# Patient Record
Sex: Female | Born: 1965 | Race: Black or African American | Hispanic: No | Smoking: Never smoker
Health system: Southern US, Community
[De-identification: ages and names within clinical notes are randomized; demographics above are authoritative.]

## PROBLEM LIST (undated history)

## (undated) DIAGNOSIS — C9000 Multiple myeloma not having achieved remission: Secondary | ICD-10-CM

## (undated) DIAGNOSIS — M459 Ankylosing spondylitis of unspecified sites in spine: Secondary | ICD-10-CM

## (undated) DIAGNOSIS — M1990 Unspecified osteoarthritis, unspecified site: Secondary | ICD-10-CM

## (undated) DIAGNOSIS — K559 Vascular disorder of intestine, unspecified: Secondary | ICD-10-CM

## (undated) DIAGNOSIS — C50919 Malignant neoplasm of unspecified site of unspecified female breast: Secondary | ICD-10-CM

## (undated) DIAGNOSIS — K449 Diaphragmatic hernia without obstruction or gangrene: Secondary | ICD-10-CM

## (undated) DIAGNOSIS — K219 Gastro-esophageal reflux disease without esophagitis: Secondary | ICD-10-CM

## (undated) DIAGNOSIS — E0590 Thyrotoxicosis, unspecified without thyrotoxic crisis or storm: Secondary | ICD-10-CM

## (undated) DIAGNOSIS — I10 Essential (primary) hypertension: Secondary | ICD-10-CM

## (undated) DIAGNOSIS — F419 Anxiety disorder, unspecified: Secondary | ICD-10-CM

## (undated) DIAGNOSIS — R0683 Snoring: Principal | ICD-10-CM

## (undated) DIAGNOSIS — M7071 Other bursitis of hip, right hip: Secondary | ICD-10-CM

## (undated) DIAGNOSIS — I499 Cardiac arrhythmia, unspecified: Secondary | ICD-10-CM

## (undated) HISTORY — DX: Gastro-esophageal reflux disease without esophagitis: K21.9

## (undated) HISTORY — DX: Other bursitis of hip, right hip: M70.71

## (undated) HISTORY — PX: MASTECTOMY: SHX3

## (undated) HISTORY — DX: Snoring: R06.83

## (undated) HISTORY — DX: Anxiety disorder, unspecified: F41.9

## (undated) HISTORY — DX: Cardiac arrhythmia, unspecified: I49.9

## (undated) HISTORY — PX: ABDOMINAL HYSTERECTOMY: SHX81

## (undated) HISTORY — DX: Diaphragmatic hernia without obstruction or gangrene: K44.9

## (undated) HISTORY — PX: BREAST LUMPECTOMY: SHX2

## (undated) HISTORY — DX: Vascular disorder of intestine, unspecified: K55.9

## (undated) HISTORY — DX: Thyrotoxicosis, unspecified without thyrotoxic crisis or storm: E05.90

## (undated) HISTORY — DX: Ankylosing spondylitis of unspecified sites in spine: M45.9

## (undated) HISTORY — DX: Multiple myeloma not having achieved remission: C90.00

## (undated) HISTORY — DX: Unspecified osteoarthritis, unspecified site: M19.90

---

## 1997-07-17 ENCOUNTER — Encounter (HOSPITAL_COMMUNITY): Admission: RE | Admit: 1997-07-17 | Discharge: 1997-10-15 | Payer: Self-pay | Admitting: Obstetrics and Gynecology

## 1997-10-30 ENCOUNTER — Inpatient Hospital Stay (HOSPITAL_COMMUNITY): Admission: AD | Admit: 1997-10-30 | Discharge: 1997-10-30 | Payer: Self-pay | Admitting: Obstetrics and Gynecology

## 1997-12-04 ENCOUNTER — Inpatient Hospital Stay (HOSPITAL_COMMUNITY): Admission: AD | Admit: 1997-12-04 | Discharge: 1997-12-04 | Payer: Self-pay | Admitting: Obstetrics and Gynecology

## 1997-12-25 ENCOUNTER — Encounter (HOSPITAL_COMMUNITY): Admission: RE | Admit: 1997-12-25 | Discharge: 1998-03-25 | Payer: Self-pay | Admitting: Obstetrics & Gynecology

## 1998-01-08 ENCOUNTER — Inpatient Hospital Stay (HOSPITAL_COMMUNITY): Admission: AD | Admit: 1998-01-08 | Discharge: 1998-01-08 | Payer: Self-pay | Admitting: Obstetrics & Gynecology

## 1998-09-18 ENCOUNTER — Other Ambulatory Visit: Admission: RE | Admit: 1998-09-18 | Discharge: 1998-09-18 | Payer: Self-pay | Admitting: Obstetrics and Gynecology

## 2000-01-23 ENCOUNTER — Encounter: Admission: RE | Admit: 2000-01-23 | Discharge: 2000-01-23 | Payer: Self-pay | Admitting: General Surgery

## 2000-01-23 ENCOUNTER — Encounter (INDEPENDENT_AMBULATORY_CARE_PROVIDER_SITE_OTHER): Payer: Self-pay | Admitting: Specialist

## 2000-01-23 ENCOUNTER — Other Ambulatory Visit: Admission: RE | Admit: 2000-01-23 | Discharge: 2000-01-23 | Payer: Self-pay | Admitting: General Surgery

## 2000-01-23 ENCOUNTER — Encounter: Payer: Self-pay | Admitting: General Surgery

## 2001-10-13 ENCOUNTER — Other Ambulatory Visit: Admission: RE | Admit: 2001-10-13 | Discharge: 2001-10-13 | Payer: Self-pay | Admitting: Obstetrics and Gynecology

## 2002-10-31 ENCOUNTER — Other Ambulatory Visit: Admission: RE | Admit: 2002-10-31 | Discharge: 2002-10-31 | Payer: Self-pay | Admitting: Obstetrics and Gynecology

## 2003-11-22 ENCOUNTER — Other Ambulatory Visit: Admission: RE | Admit: 2003-11-22 | Discharge: 2003-11-22 | Payer: Self-pay | Admitting: Obstetrics and Gynecology

## 2004-12-11 ENCOUNTER — Other Ambulatory Visit: Admission: RE | Admit: 2004-12-11 | Discharge: 2004-12-11 | Payer: Self-pay | Admitting: Obstetrics and Gynecology

## 2005-01-20 ENCOUNTER — Encounter: Admission: RE | Admit: 2005-01-20 | Discharge: 2005-01-20 | Payer: Self-pay | Admitting: Family Medicine

## 2005-02-09 ENCOUNTER — Encounter (INDEPENDENT_AMBULATORY_CARE_PROVIDER_SITE_OTHER): Payer: Self-pay | Admitting: *Deleted

## 2005-02-09 ENCOUNTER — Other Ambulatory Visit: Admission: RE | Admit: 2005-02-09 | Discharge: 2005-02-09 | Payer: Self-pay | Admitting: Interventional Radiology

## 2005-02-09 ENCOUNTER — Encounter: Admission: RE | Admit: 2005-02-09 | Discharge: 2005-02-09 | Payer: Self-pay | Admitting: Family Medicine

## 2005-07-03 ENCOUNTER — Encounter: Admission: RE | Admit: 2005-07-03 | Discharge: 2005-07-03 | Payer: Self-pay | Admitting: Obstetrics and Gynecology

## 2005-07-03 ENCOUNTER — Encounter (INDEPENDENT_AMBULATORY_CARE_PROVIDER_SITE_OTHER): Payer: Self-pay | Admitting: *Deleted

## 2005-07-03 ENCOUNTER — Encounter (INDEPENDENT_AMBULATORY_CARE_PROVIDER_SITE_OTHER): Payer: Self-pay | Admitting: Diagnostic Radiology

## 2005-07-13 ENCOUNTER — Ambulatory Visit (HOSPITAL_COMMUNITY): Admission: RE | Admit: 2005-07-13 | Discharge: 2005-07-13 | Payer: Self-pay | Admitting: Obstetrics and Gynecology

## 2005-08-11 ENCOUNTER — Encounter: Admission: RE | Admit: 2005-08-11 | Discharge: 2005-08-11 | Payer: Self-pay | Admitting: General Surgery

## 2005-08-13 ENCOUNTER — Ambulatory Visit (HOSPITAL_BASED_OUTPATIENT_CLINIC_OR_DEPARTMENT_OTHER): Admission: RE | Admit: 2005-08-13 | Discharge: 2005-08-13 | Payer: Self-pay | Admitting: General Surgery

## 2005-08-13 ENCOUNTER — Encounter (INDEPENDENT_AMBULATORY_CARE_PROVIDER_SITE_OTHER): Payer: Self-pay | Admitting: *Deleted

## 2005-08-13 ENCOUNTER — Encounter: Admission: RE | Admit: 2005-08-13 | Discharge: 2005-08-13 | Payer: Self-pay | Admitting: General Surgery

## 2005-08-14 ENCOUNTER — Ambulatory Visit: Payer: Self-pay | Admitting: Oncology

## 2005-08-26 LAB — CBC WITH DIFFERENTIAL/PLATELET
BASO%: 0.4 % (ref 0.0–2.0)
Basophils Absolute: 0 10*3/uL (ref 0.0–0.1)
HCT: 35 % (ref 34.8–46.6)
LYMPH%: 21.2 % (ref 14.0–48.0)
MCHC: 33.5 g/dL (ref 32.0–36.0)
MCV: 83 fL (ref 81.0–101.0)
MONO#: 0.7 10*3/uL (ref 0.1–0.9)
MONO%: 8.3 % (ref 0.0–13.0)
NEUT%: 69.3 % (ref 39.6–76.8)
Platelets: 407 10*3/uL — ABNORMAL HIGH (ref 145–400)
RBC: 4.22 10*6/uL (ref 3.70–5.32)
lymph#: 1.9 10*3/uL (ref 0.9–3.3)

## 2005-08-26 LAB — COMPREHENSIVE METABOLIC PANEL
ALT: 17 U/L (ref 0–40)
AST: 18 U/L (ref 0–37)
Chloride: 103 mEq/L (ref 96–112)
Creatinine, Ser: 0.66 mg/dL (ref 0.40–1.20)
Total Bilirubin: 0.4 mg/dL (ref 0.3–1.2)

## 2005-09-01 ENCOUNTER — Encounter (INDEPENDENT_AMBULATORY_CARE_PROVIDER_SITE_OTHER): Payer: Self-pay | Admitting: Specialist

## 2005-09-01 ENCOUNTER — Ambulatory Visit (HOSPITAL_BASED_OUTPATIENT_CLINIC_OR_DEPARTMENT_OTHER): Admission: RE | Admit: 2005-09-01 | Discharge: 2005-09-01 | Payer: Self-pay | Admitting: General Surgery

## 2005-09-09 ENCOUNTER — Ambulatory Visit (HOSPITAL_COMMUNITY): Admission: RE | Admit: 2005-09-09 | Discharge: 2005-09-09 | Payer: Self-pay | Admitting: Oncology

## 2005-09-14 ENCOUNTER — Ambulatory Visit (HOSPITAL_COMMUNITY): Admission: RE | Admit: 2005-09-14 | Discharge: 2005-09-14 | Payer: Self-pay | Admitting: Oncology

## 2005-09-21 LAB — CBC WITH DIFFERENTIAL/PLATELET
BASO%: 0.6 % (ref 0.0–2.0)
EOS%: 0.5 % (ref 0.0–7.0)
MCH: 27.9 pg (ref 26.0–34.0)
MCHC: 34.2 g/dL (ref 32.0–36.0)
RBC: 3.92 10*6/uL (ref 3.70–5.32)
RDW: 13.2 % (ref 11.3–14.5)
lymph#: 1.9 10*3/uL (ref 0.9–3.3)

## 2005-09-23 ENCOUNTER — Ambulatory Visit: Payer: Self-pay | Admitting: Oncology

## 2005-09-26 ENCOUNTER — Ambulatory Visit (HOSPITAL_COMMUNITY): Admission: RE | Admit: 2005-09-26 | Discharge: 2005-09-26 | Payer: Self-pay | Admitting: Oncology

## 2005-09-29 LAB — CBC WITH DIFFERENTIAL/PLATELET
Basophils Absolute: 0 10*3/uL (ref 0.0–0.1)
EOS%: 3.8 % (ref 0.0–7.0)
Eosinophils Absolute: 0.1 10*3/uL (ref 0.0–0.5)
HGB: 10.5 g/dL — ABNORMAL LOW (ref 11.6–15.9)
MCH: 27.5 pg (ref 26.0–34.0)
NEUT#: 1.5 10*3/uL (ref 1.5–6.5)
RDW: 13 % (ref 11.3–14.5)
lymph#: 1.2 10*3/uL (ref 0.9–3.3)

## 2005-09-29 LAB — COMPREHENSIVE METABOLIC PANEL
AST: 14 U/L (ref 0–37)
Albumin: 4.2 g/dL (ref 3.5–5.2)
BUN: 10 mg/dL (ref 6–23)
Calcium: 8.9 mg/dL (ref 8.4–10.5)
Chloride: 106 mEq/L (ref 96–112)
Potassium: 4 mEq/L (ref 3.5–5.3)

## 2005-10-05 LAB — CBC WITH DIFFERENTIAL/PLATELET
Eosinophils Absolute: 0.1 10*3/uL (ref 0.0–0.5)
LYMPH%: 30 % (ref 14.0–48.0)
MCHC: 33.3 g/dL (ref 32.0–36.0)
MCV: 82.2 fL (ref 81.0–101.0)
MONO%: 14.1 % — ABNORMAL HIGH (ref 0.0–13.0)
NEUT#: 3.5 10*3/uL (ref 1.5–6.5)
Platelets: 346 10*3/uL (ref 145–400)
RBC: 3.87 10*6/uL (ref 3.70–5.32)

## 2005-10-05 LAB — URINALYSIS, MICROSCOPIC - CHCC
Blood: NEGATIVE
Glucose: NEGATIVE g/dL
Leukocyte Esterase: NEGATIVE
Nitrite: NEGATIVE
Protein: NEGATIVE mg/dL

## 2005-10-12 LAB — CBC WITH DIFFERENTIAL/PLATELET
Basophils Absolute: 0.1 10*3/uL (ref 0.0–0.1)
EOS%: 0.6 % (ref 0.0–7.0)
HCT: 30.9 % — ABNORMAL LOW (ref 34.8–46.6)
HGB: 10.2 g/dL — ABNORMAL LOW (ref 11.6–15.9)
LYMPH%: 23.1 % (ref 14.0–48.0)
MCH: 26.7 pg (ref 26.0–34.0)
MCV: 81.2 fL (ref 81.0–101.0)
MONO%: 2.9 % (ref 0.0–13.0)
NEUT%: 71.9 % (ref 39.6–76.8)
Platelets: 388 10*3/uL (ref 145–400)

## 2005-10-19 LAB — CBC WITH DIFFERENTIAL/PLATELET
Basophils Absolute: 0 10*3/uL (ref 0.0–0.1)
Eosinophils Absolute: 0 10*3/uL (ref 0.0–0.5)
HGB: 11.1 g/dL — ABNORMAL LOW (ref 11.6–15.9)
LYMPH%: 18.3 % (ref 14.0–48.0)
MCH: 27.4 pg (ref 26.0–34.0)
MCV: 81.6 fL (ref 81.0–101.0)
MONO%: 12.8 % (ref 0.0–13.0)
NEUT#: 5.1 10*3/uL (ref 1.5–6.5)
NEUT%: 68.1 % (ref 39.6–76.8)
Platelets: 281 10*3/uL (ref 145–400)

## 2005-10-26 LAB — CBC WITH DIFFERENTIAL/PLATELET
EOS%: 0.1 % (ref 0.0–7.0)
Eosinophils Absolute: 0 10*3/uL (ref 0.0–0.5)
LYMPH%: 19.4 % (ref 14.0–48.0)
MCH: 27 pg (ref 26.0–34.0)
MCHC: 33.4 g/dL (ref 32.0–36.0)
MCV: 80.9 fL — ABNORMAL LOW (ref 81.0–101.0)
MONO%: 1.3 % (ref 0.0–13.0)
NEUT#: 2.9 10*3/uL (ref 1.5–6.5)
Platelets: 313 10*3/uL (ref 145–400)
RBC: 4.16 10*6/uL (ref 3.70–5.32)

## 2005-11-02 LAB — CBC WITH DIFFERENTIAL/PLATELET
BASO%: 0.6 % (ref 0.0–2.0)
LYMPH%: 17.4 % (ref 14.0–48.0)
MCHC: 33.8 g/dL (ref 32.0–36.0)
MONO#: 1.2 10*3/uL — ABNORMAL HIGH (ref 0.1–0.9)
MONO%: 14.8 % — ABNORMAL HIGH (ref 0.0–13.0)
Platelets: 340 10*3/uL (ref 145–400)
RBC: 3.87 10*6/uL (ref 3.70–5.32)
WBC: 8 10*3/uL (ref 3.9–10.0)

## 2005-11-09 ENCOUNTER — Ambulatory Visit: Payer: Self-pay | Admitting: Oncology

## 2005-11-10 LAB — CBC WITH DIFFERENTIAL/PLATELET
Basophils Absolute: 0 10*3/uL (ref 0.0–0.1)
Eosinophils Absolute: 0 10*3/uL (ref 0.0–0.5)
HCT: 32 % — ABNORMAL LOW (ref 34.8–46.6)
HGB: 10.6 g/dL — ABNORMAL LOW (ref 11.6–15.9)
MCH: 26.9 pg (ref 26.0–34.0)
MCV: 80.7 fL — ABNORMAL LOW (ref 81.0–101.0)
MONO%: 7.6 % (ref 0.0–13.0)
NEUT#: 4.3 10*3/uL (ref 1.5–6.5)
NEUT%: 78.4 % — ABNORMAL HIGH (ref 39.6–76.8)
RDW: 16.5 % — ABNORMAL HIGH (ref 11.3–14.5)
lymph#: 0.7 10*3/uL — ABNORMAL LOW (ref 0.9–3.3)

## 2005-11-10 LAB — COMPREHENSIVE METABOLIC PANEL
Albumin: 4.3 g/dL (ref 3.5–5.2)
BUN: 11 mg/dL (ref 6–23)
CO2: 27 mEq/L (ref 19–32)
Calcium: 9.3 mg/dL (ref 8.4–10.5)
Glucose, Bld: 114 mg/dL — ABNORMAL HIGH (ref 70–99)
Potassium: 4.1 mEq/L (ref 3.5–5.3)
Sodium: 141 mEq/L (ref 135–145)
Total Protein: 7.1 g/dL (ref 6.0–8.3)

## 2005-11-10 LAB — CANCER ANTIGEN 27.29: CA 27.29: 29 U/mL (ref 0–39)

## 2005-11-24 LAB — CBC WITH DIFFERENTIAL/PLATELET
BASO%: 1.8 % (ref 0.0–2.0)
LYMPH%: 19.2 % (ref 14.0–48.0)
MCHC: 32.5 g/dL (ref 32.0–36.0)
MONO#: 1 10*3/uL — ABNORMAL HIGH (ref 0.1–0.9)
NEUT#: 3.6 10*3/uL (ref 1.5–6.5)
RBC: 4.24 10*6/uL (ref 3.70–5.32)
RDW: 16.7 % — ABNORMAL HIGH (ref 11.3–14.5)
WBC: 5.9 10*3/uL (ref 3.9–10.0)
lymph#: 1.1 10*3/uL (ref 0.9–3.3)

## 2005-12-01 LAB — CBC WITH DIFFERENTIAL/PLATELET
BASO%: 2.3 % — ABNORMAL HIGH (ref 0.0–2.0)
HCT: 34 % — ABNORMAL LOW (ref 34.8–46.6)
LYMPH%: 28.7 % (ref 14.0–48.0)
MCHC: 32.3 g/dL (ref 32.0–36.0)
MCV: 82.3 fL (ref 81.0–101.0)
MONO#: 0.5 10*3/uL (ref 0.1–0.9)
MONO%: 12.8 % (ref 0.0–13.0)
NEUT%: 53 % (ref 39.6–76.8)
Platelets: 296 10*3/uL (ref 145–400)
WBC: 3.8 10*3/uL — ABNORMAL LOW (ref 3.9–10.0)

## 2005-12-08 LAB — CBC WITH DIFFERENTIAL/PLATELET
BASO%: 1.5 % (ref 0.0–2.0)
EOS%: 2.8 % (ref 0.0–7.0)
HCT: 33.4 % — ABNORMAL LOW (ref 34.8–46.6)
LYMPH%: 21.4 % (ref 14.0–48.0)
MCH: 27 pg (ref 26.0–34.0)
MCHC: 32.8 g/dL (ref 32.0–36.0)
NEUT%: 64.3 % (ref 39.6–76.8)
Platelets: 256 10*3/uL (ref 145–400)
lymph#: 0.9 10*3/uL (ref 0.9–3.3)

## 2005-12-25 ENCOUNTER — Ambulatory Visit: Payer: Self-pay | Admitting: Oncology

## 2005-12-29 LAB — CBC WITH DIFFERENTIAL/PLATELET
Basophils Absolute: 0.1 10*3/uL (ref 0.0–0.1)
Eosinophils Absolute: 0.1 10*3/uL (ref 0.0–0.5)
HCT: 34.4 % — ABNORMAL LOW (ref 34.8–46.6)
HGB: 11.5 g/dL — ABNORMAL LOW (ref 11.6–15.9)
MCV: 80.9 fL — ABNORMAL LOW (ref 81.0–101.0)
NEUT#: 6 10*3/uL (ref 1.5–6.5)
NEUT%: 73.4 % (ref 39.6–76.8)
RDW: 16.5 % — ABNORMAL HIGH (ref 11.3–14.5)
lymph#: 1.3 10*3/uL (ref 0.9–3.3)

## 2006-02-08 LAB — CBC WITH DIFFERENTIAL/PLATELET
BASO%: 0.9 % (ref 0.0–2.0)
Basophils Absolute: 0 10*3/uL (ref 0.0–0.1)
EOS%: 2.1 % (ref 0.0–7.0)
HCT: 35.8 % (ref 34.8–46.6)
HGB: 11.6 g/dL (ref 11.6–15.9)
MCH: 27 pg (ref 26.0–34.0)
MONO#: 0.6 10*3/uL (ref 0.1–0.9)
NEUT%: 54.7 % (ref 39.6–76.8)
RDW: 13.7 % (ref 11.3–14.5)
WBC: 5.2 10*3/uL (ref 3.9–10.0)
lymph#: 1.6 10*3/uL (ref 0.9–3.3)

## 2006-02-12 ENCOUNTER — Encounter (INDEPENDENT_AMBULATORY_CARE_PROVIDER_SITE_OTHER): Payer: Self-pay | Admitting: Specialist

## 2006-02-12 ENCOUNTER — Ambulatory Visit (HOSPITAL_BASED_OUTPATIENT_CLINIC_OR_DEPARTMENT_OTHER): Admission: RE | Admit: 2006-02-12 | Discharge: 2006-02-13 | Payer: Self-pay | Admitting: General Surgery

## 2006-03-17 ENCOUNTER — Ambulatory Visit: Payer: Self-pay | Admitting: Oncology

## 2006-03-30 ENCOUNTER — Ambulatory Visit (HOSPITAL_COMMUNITY): Admission: RE | Admit: 2006-03-30 | Discharge: 2006-03-30 | Payer: Self-pay | Admitting: Oncology

## 2006-04-01 ENCOUNTER — Ambulatory Visit: Admission: RE | Admit: 2006-04-01 | Discharge: 2006-06-17 | Payer: Self-pay | Admitting: *Deleted

## 2006-04-06 LAB — CBC WITH DIFFERENTIAL/PLATELET
BASO%: 0.4 % (ref 0.0–2.0)
Basophils Absolute: 0 10*3/uL (ref 0.0–0.1)
EOS%: 0.8 % (ref 0.0–7.0)
Eosinophils Absolute: 0 10*3/uL (ref 0.0–0.5)
HCT: 33 % — ABNORMAL LOW (ref 34.8–46.6)
HGB: 10.9 g/dL — ABNORMAL LOW (ref 11.6–15.9)
LYMPH%: 29.3 % (ref 14.0–48.0)
MCH: 25.7 pg — ABNORMAL LOW (ref 26.0–34.0)
MCHC: 32.9 g/dL (ref 32.0–36.0)
MCV: 78.2 fL — ABNORMAL LOW (ref 81.0–101.0)
MONO#: 0.4 10*3/uL (ref 0.1–0.9)
MONO%: 6.7 % (ref 0.0–13.0)
NEUT#: 3.9 10*3/uL (ref 1.5–6.5)
NEUT%: 62.8 % (ref 39.6–76.8)
Platelets: 328 10*3/uL (ref 145–400)
RBC: 4.22 10*6/uL (ref 3.70–5.32)
RDW: 14.9 % — ABNORMAL HIGH (ref 11.3–14.5)
WBC: 6.2 10*3/uL (ref 3.9–10.0)
lymph#: 1.8 10*3/uL (ref 0.9–3.3)

## 2006-04-06 LAB — COMPREHENSIVE METABOLIC PANEL
Albumin: 4.2 g/dL (ref 3.5–5.2)
Alkaline Phosphatase: 98 U/L (ref 39–117)
BUN: 11 mg/dL (ref 6–23)
Glucose, Bld: 117 mg/dL — ABNORMAL HIGH (ref 70–99)
Total Bilirubin: 0.3 mg/dL (ref 0.3–1.2)

## 2006-05-20 ENCOUNTER — Ambulatory Visit: Payer: Self-pay | Admitting: Oncology

## 2006-05-25 LAB — CBC WITH DIFFERENTIAL/PLATELET
Basophils Absolute: 0 10*3/uL (ref 0.0–0.1)
Eosinophils Absolute: 0 10*3/uL (ref 0.0–0.5)
HCT: 35.1 % (ref 34.8–46.6)
LYMPH%: 22.7 % (ref 14.0–48.0)
MCV: 76.9 fL — ABNORMAL LOW (ref 81.0–101.0)
MONO%: 6.6 % (ref 0.0–13.0)
NEUT#: 5.6 10*3/uL (ref 1.5–6.5)
NEUT%: 70.2 % (ref 39.6–76.8)
Platelets: 329 10*3/uL (ref 145–400)
RBC: 4.56 10*6/uL (ref 3.70–5.32)

## 2006-05-25 LAB — COMPREHENSIVE METABOLIC PANEL
Alkaline Phosphatase: 78 U/L (ref 39–117)
BUN: 11 mg/dL (ref 6–23)
Creatinine, Ser: 0.69 mg/dL (ref 0.40–1.20)
Glucose, Bld: 111 mg/dL — ABNORMAL HIGH (ref 70–99)
Sodium: 142 mEq/L (ref 135–145)
Total Bilirubin: 0.4 mg/dL (ref 0.3–1.2)

## 2006-05-25 LAB — CANCER ANTIGEN 27.29: CA 27.29: 13 U/mL (ref 0–39)

## 2006-08-25 ENCOUNTER — Ambulatory Visit: Payer: Self-pay | Admitting: Oncology

## 2006-08-26 ENCOUNTER — Ambulatory Visit: Payer: Self-pay | Admitting: Vascular Surgery

## 2006-08-26 ENCOUNTER — Ambulatory Visit (HOSPITAL_COMMUNITY): Admission: RE | Admit: 2006-08-26 | Discharge: 2006-08-26 | Payer: Self-pay | Admitting: Oncology

## 2006-08-28 ENCOUNTER — Ambulatory Visit (HOSPITAL_COMMUNITY): Admission: RE | Admit: 2006-08-28 | Discharge: 2006-08-28 | Payer: Self-pay | Admitting: Oncology

## 2006-08-31 ENCOUNTER — Ambulatory Visit (HOSPITAL_COMMUNITY): Admission: RE | Admit: 2006-08-31 | Discharge: 2006-08-31 | Payer: Self-pay | Admitting: Oncology

## 2006-11-26 ENCOUNTER — Ambulatory Visit: Payer: Self-pay | Admitting: Oncology

## 2006-11-30 ENCOUNTER — Ambulatory Visit (HOSPITAL_COMMUNITY): Admission: RE | Admit: 2006-11-30 | Discharge: 2006-11-30 | Payer: Self-pay | Admitting: Oncology

## 2006-12-03 LAB — CBC WITH DIFFERENTIAL/PLATELET
Eosinophils Absolute: 0.1 10*3/uL (ref 0.0–0.5)
HCT: 35.2 % (ref 34.8–46.6)
HGB: 12.4 g/dL (ref 11.6–15.9)
LYMPH%: 21 % (ref 14.0–48.0)
MONO#: 0.5 10*3/uL (ref 0.1–0.9)
NEUT#: 7.2 10*3/uL — ABNORMAL HIGH (ref 1.5–6.5)
NEUT%: 72 % (ref 39.6–76.8)
Platelets: 271 10*3/uL (ref 145–400)
WBC: 10.1 10*3/uL — ABNORMAL HIGH (ref 3.9–10.0)
lymph#: 2.1 10*3/uL (ref 0.9–3.3)

## 2006-12-03 LAB — COMPREHENSIVE METABOLIC PANEL
ALT: 13 U/L (ref 0–35)
CO2: 24 mEq/L (ref 19–32)
Calcium: 8.9 mg/dL (ref 8.4–10.5)
Chloride: 106 mEq/L (ref 96–112)
Creatinine, Ser: 0.62 mg/dL (ref 0.40–1.20)
Glucose, Bld: 107 mg/dL — ABNORMAL HIGH (ref 70–99)
Total Bilirubin: 0.3 mg/dL (ref 0.3–1.2)
Total Protein: 7.5 g/dL (ref 6.0–8.3)

## 2006-12-03 LAB — CANCER ANTIGEN 27.29: CA 27.29: 9 U/mL (ref 0–39)

## 2007-01-21 ENCOUNTER — Ambulatory Visit: Payer: Self-pay | Admitting: Oncology

## 2007-01-25 LAB — CBC WITH DIFFERENTIAL/PLATELET
BASO%: 0.6 % (ref 0.0–2.0)
EOS%: 1 % (ref 0.0–7.0)
MCH: 29.5 pg (ref 26.0–34.0)
MCV: 84.4 fL (ref 81.0–101.0)
MONO%: 7.7 % (ref 0.0–13.0)
RBC: 4.12 10*6/uL (ref 3.70–5.32)
RDW: 13.2 % (ref 11.3–14.5)
lymph#: 1.8 10*3/uL (ref 0.9–3.3)

## 2007-02-04 ENCOUNTER — Ambulatory Visit (HOSPITAL_COMMUNITY): Admission: RE | Admit: 2007-02-04 | Discharge: 2007-02-04 | Payer: Self-pay | Admitting: Obstetrics and Gynecology

## 2007-03-03 ENCOUNTER — Encounter: Admission: RE | Admit: 2007-03-03 | Discharge: 2007-03-03 | Payer: Self-pay | Admitting: Internal Medicine

## 2007-03-16 ENCOUNTER — Ambulatory Visit: Payer: Self-pay | Admitting: Oncology

## 2007-07-12 ENCOUNTER — Inpatient Hospital Stay (HOSPITAL_COMMUNITY): Admission: RE | Admit: 2007-07-12 | Discharge: 2007-07-14 | Payer: Self-pay | Admitting: Obstetrics and Gynecology

## 2007-07-12 ENCOUNTER — Encounter (INDEPENDENT_AMBULATORY_CARE_PROVIDER_SITE_OTHER): Payer: Self-pay | Admitting: Obstetrics and Gynecology

## 2007-08-03 ENCOUNTER — Encounter: Admission: RE | Admit: 2007-08-03 | Discharge: 2007-08-03 | Payer: Self-pay | Admitting: Internal Medicine

## 2007-08-11 ENCOUNTER — Ambulatory Visit (HOSPITAL_COMMUNITY): Admission: RE | Admit: 2007-08-11 | Discharge: 2007-08-11 | Payer: Self-pay | Admitting: Oncology

## 2007-08-17 ENCOUNTER — Ambulatory Visit: Payer: Self-pay | Admitting: Oncology

## 2007-08-19 LAB — COMPREHENSIVE METABOLIC PANEL
Albumin: 4.1 g/dL (ref 3.5–5.2)
BUN: 11 mg/dL (ref 6–23)
CO2: 25 mEq/L (ref 19–32)
Glucose, Bld: 92 mg/dL (ref 70–99)
Sodium: 142 mEq/L (ref 135–145)
Total Bilirubin: 0.3 mg/dL (ref 0.3–1.2)
Total Protein: 7.3 g/dL (ref 6.0–8.3)

## 2007-08-19 LAB — CBC WITH DIFFERENTIAL/PLATELET
Basophils Absolute: 0 10*3/uL (ref 0.0–0.1)
Eosinophils Absolute: 0 10*3/uL (ref 0.0–0.5)
HCT: 33.1 % — ABNORMAL LOW (ref 34.8–46.6)
HGB: 11.5 g/dL — ABNORMAL LOW (ref 11.6–15.9)
LYMPH%: 28.9 % (ref 14.0–48.0)
MCV: 84 fL (ref 81.0–101.0)
MONO#: 0.6 10*3/uL (ref 0.1–0.9)
MONO%: 7.9 % (ref 0.0–13.0)
NEUT#: 4.6 10*3/uL (ref 1.5–6.5)
Platelets: 292 10*3/uL (ref 145–400)
RBC: 3.95 10*6/uL (ref 3.70–5.32)
WBC: 7.4 10*3/uL (ref 3.9–10.0)

## 2007-08-19 LAB — CANCER ANTIGEN 27.29: CA 27.29: 16 U/mL (ref 0–39)

## 2007-09-03 ENCOUNTER — Encounter: Admission: RE | Admit: 2007-09-03 | Discharge: 2007-09-03 | Payer: Self-pay | Admitting: Internal Medicine

## 2007-11-28 ENCOUNTER — Encounter: Admission: RE | Admit: 2007-11-28 | Discharge: 2008-02-24 | Payer: Self-pay | Admitting: Oncology

## 2007-12-26 ENCOUNTER — Ambulatory Visit: Payer: Self-pay | Admitting: Internal Medicine

## 2007-12-31 IMAGING — CR DG CHEST 1V PORT
1 series · 1 of 1 positions shown · non-contrast
Comparison: none

CLINICAL DATA: Port-A-Cath placement. Breast carcinoma.

Portable chest at 0122:
Comparison 08/11/2005. Interval placement of left subclavian portacatheter, tip
proximal right atrium. No pneumothorax. Low lung volumes with resultant crowding
of bronchovascular structures. No effusion. Heart size upper limits normal.

[view not recorded]
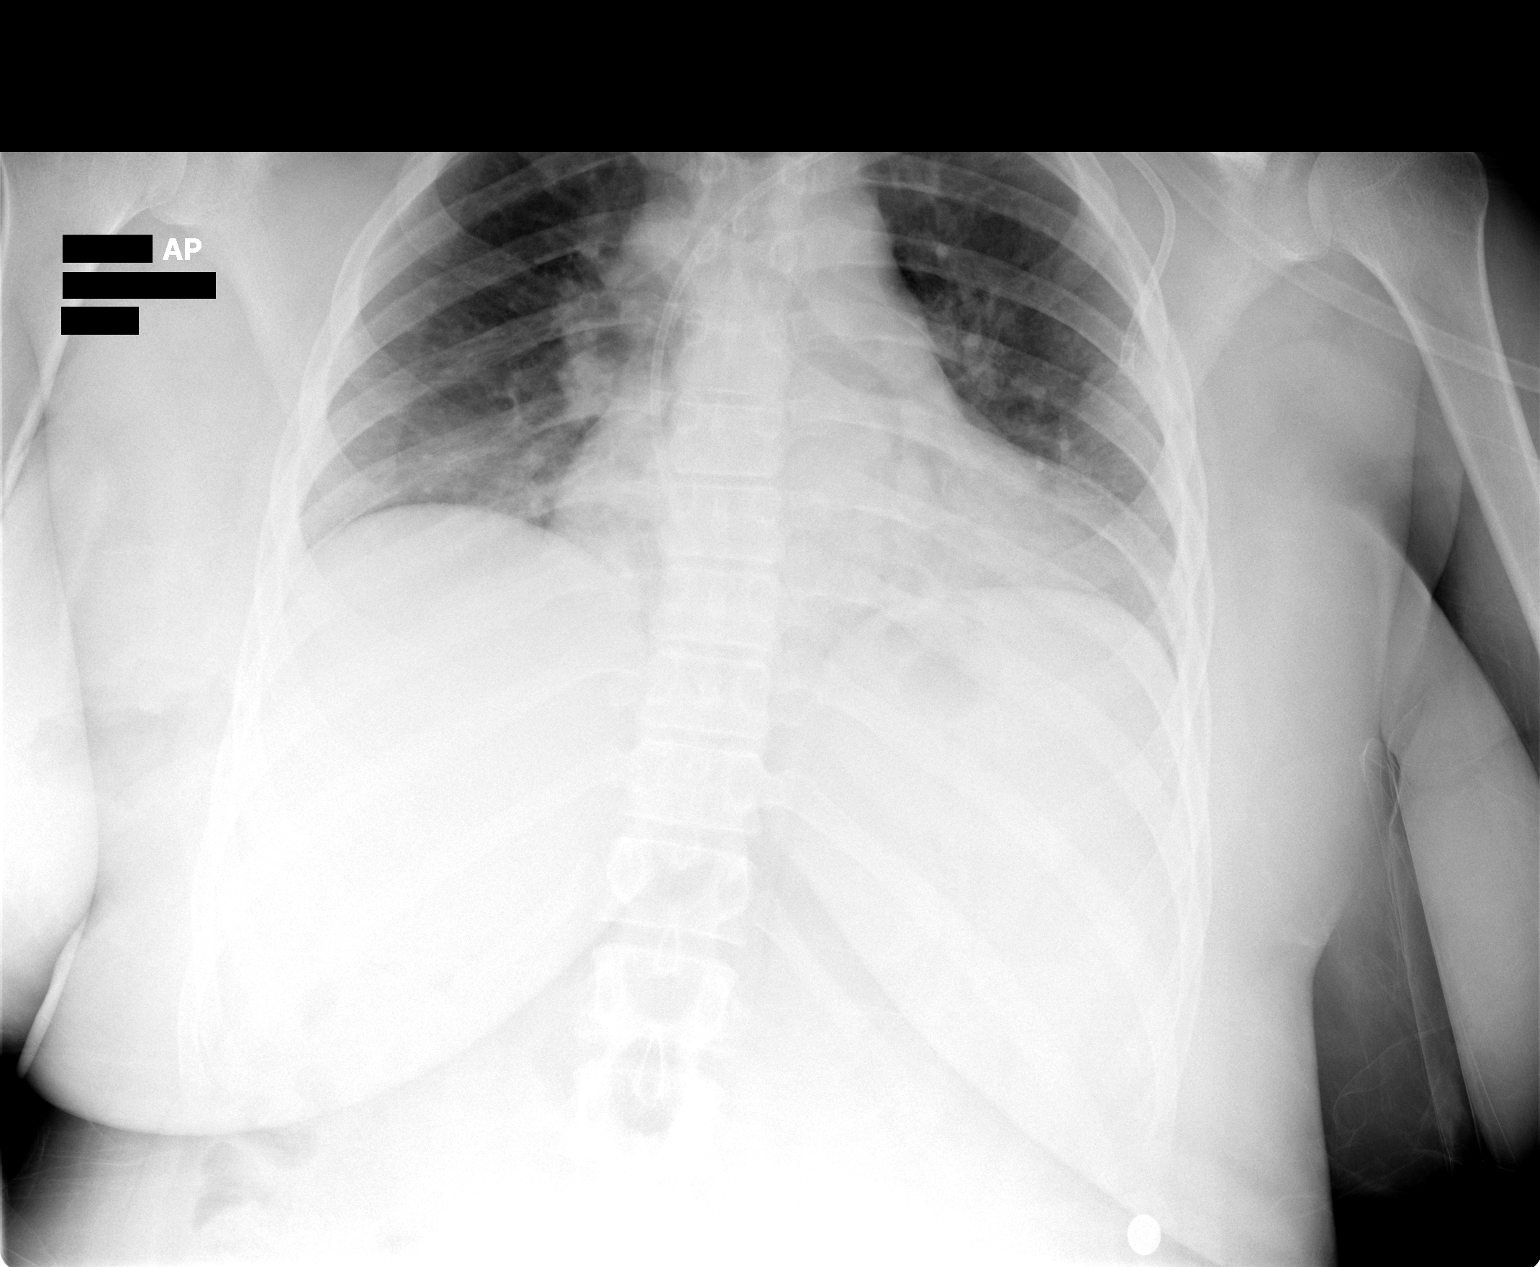

[1 of 1 positions shown; findings below may reference images not displayed]

IMPRESSION: 1. Port catheter to proximal right atrium. No pneumothorax.

## 2008-01-08 IMAGING — CR DG CHEST 2V
2 series · 2 of 2 positions shown · non-contrast
Comparison: none

CLINICAL DATA: Breast cancer. 
 CHEST ? 2 VIEW:

[view not recorded (1 of 2)]
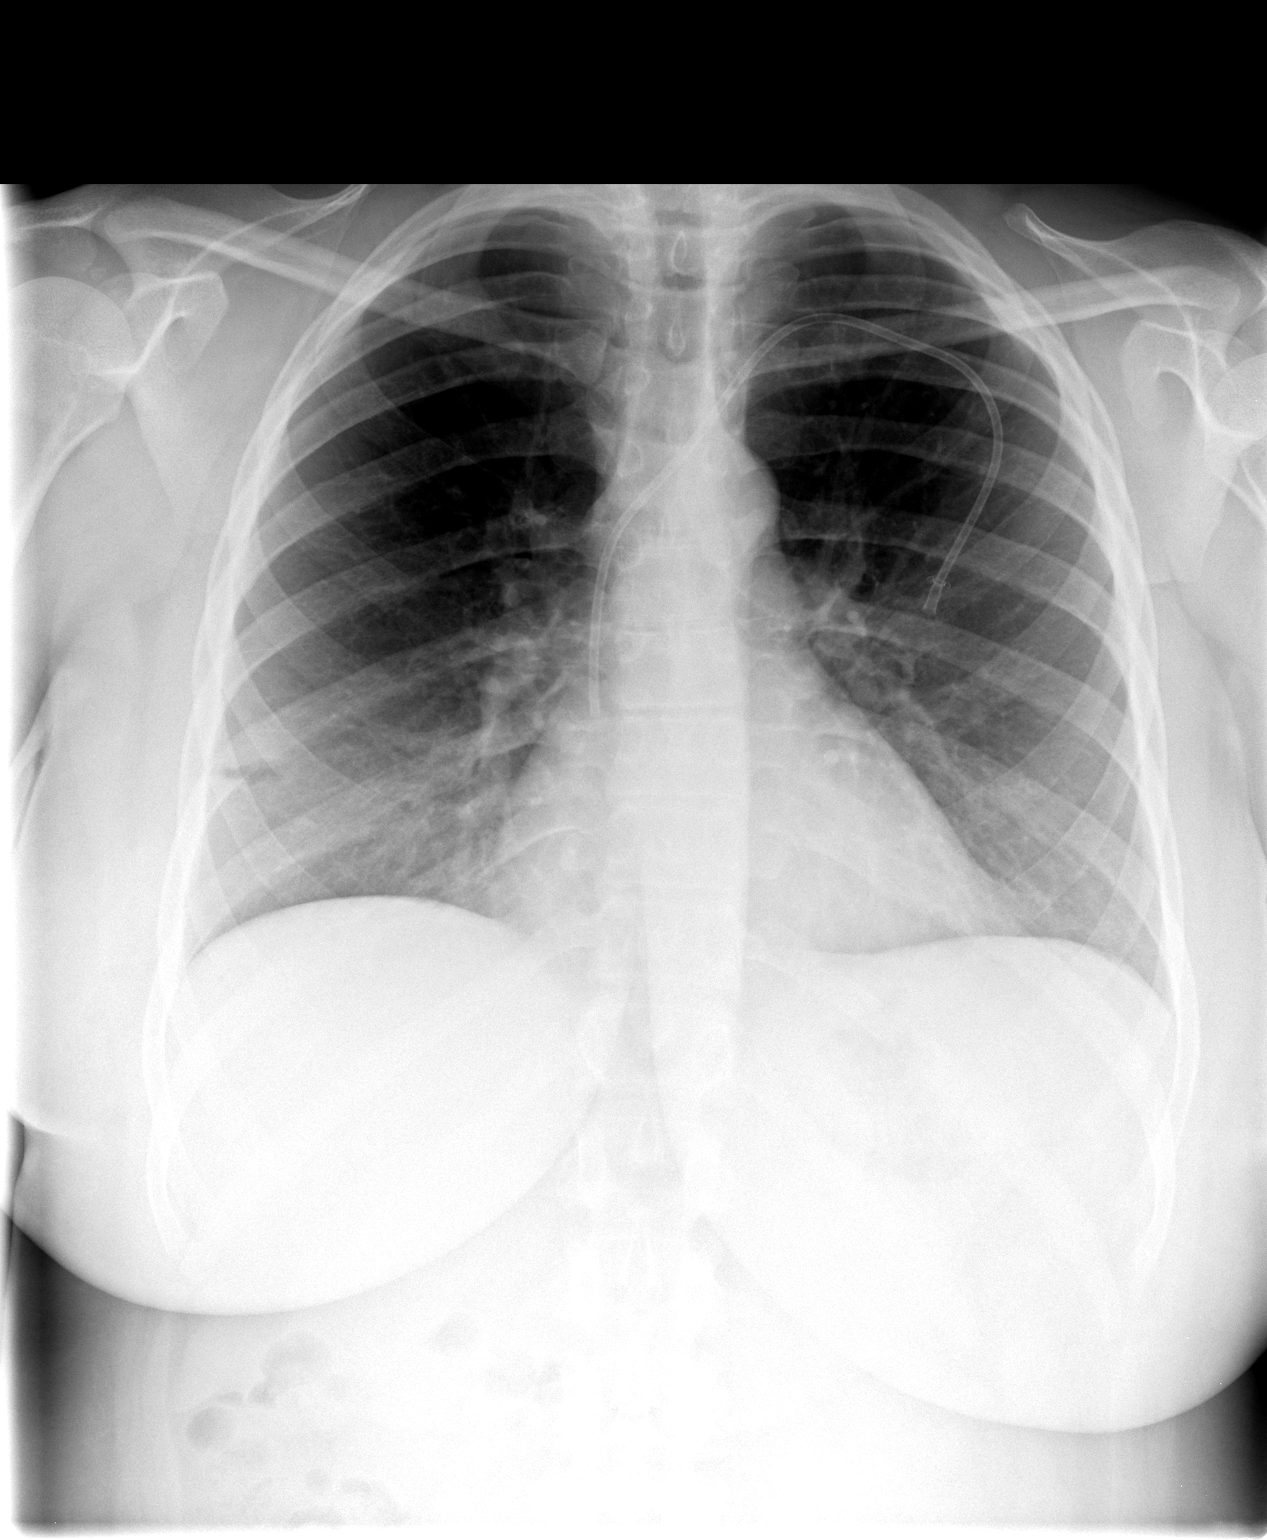

[view not recorded (2 of 2)]
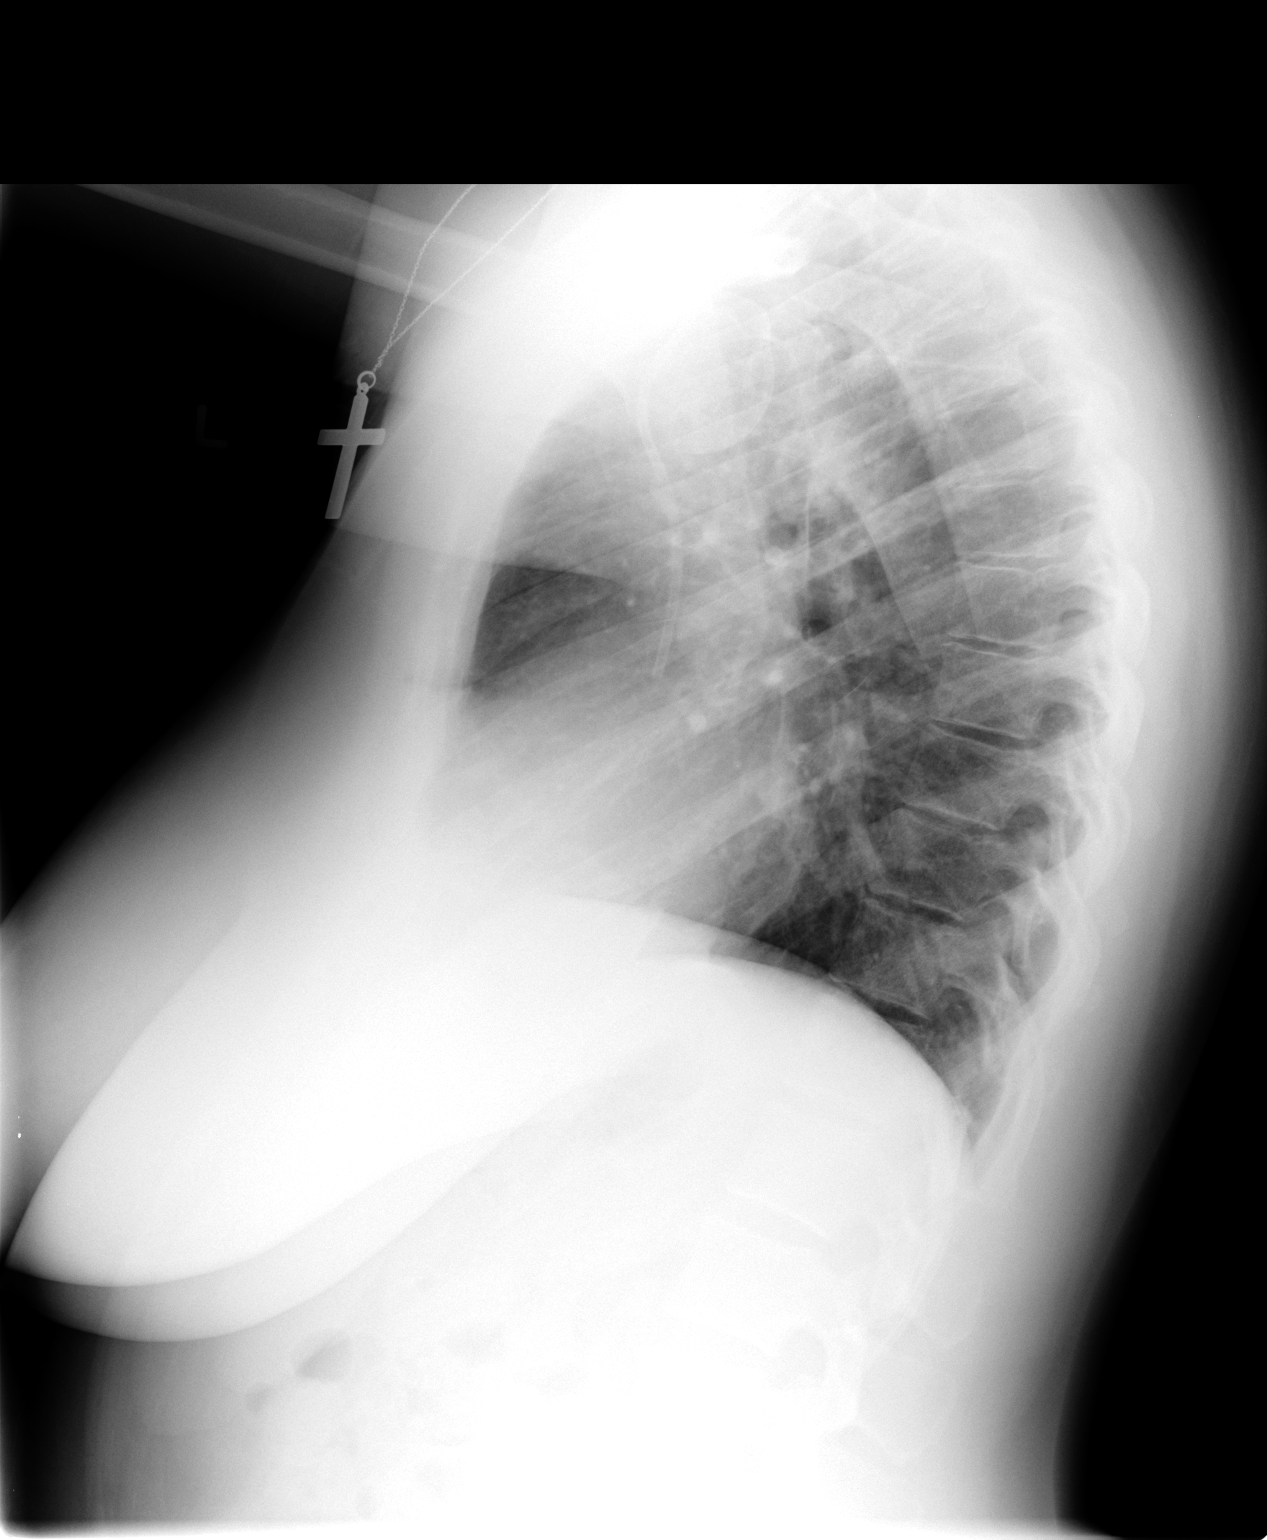

[2 of 2 positions shown; findings below may reference images not displayed]

FINDINGS: No active cardiopulmonary disease.  There is some air in the right breast suggesting recent surgery.  Port-A-Cath in place. Osseous structures are intact.
IMPRESSION: 1.  There is air in the right breast, likely related to recent surgery.  No active disease.
 2.  Port-A-Cath in place, as before.

## 2008-07-17 ENCOUNTER — Ambulatory Visit: Payer: Self-pay | Admitting: Internal Medicine

## 2008-08-07 ENCOUNTER — Ambulatory Visit: Payer: Self-pay | Admitting: Internal Medicine

## 2008-08-17 ENCOUNTER — Ambulatory Visit: Payer: Self-pay | Admitting: Oncology

## 2008-08-21 LAB — CBC WITH DIFFERENTIAL/PLATELET
Basophils Absolute: 0 10*3/uL (ref 0.0–0.1)
EOS%: 1.2 % (ref 0.0–7.0)
Eosinophils Absolute: 0.1 10*3/uL (ref 0.0–0.5)
HCT: 34.4 % — ABNORMAL LOW (ref 34.8–46.6)
HGB: 12.2 g/dL (ref 11.6–15.9)
MCH: 29.8 pg (ref 25.1–34.0)
MCV: 84.2 fL (ref 79.5–101.0)
NEUT#: 6 10*3/uL (ref 1.5–6.5)
NEUT%: 70.2 % (ref 38.4–76.8)
lymph#: 1.9 10*3/uL (ref 0.9–3.3)

## 2008-08-21 LAB — COMPREHENSIVE METABOLIC PANEL
ALT: 18 U/L (ref 0–35)
AST: 20 U/L (ref 0–37)
Alkaline Phosphatase: 66 U/L (ref 39–117)
BUN: 10 mg/dL (ref 6–23)
Chloride: 106 mEq/L (ref 96–112)
Creatinine, Ser: 0.64 mg/dL (ref 0.40–1.20)
Total Bilirubin: 0.6 mg/dL (ref 0.3–1.2)

## 2008-09-06 ENCOUNTER — Ambulatory Visit (HOSPITAL_COMMUNITY): Admission: RE | Admit: 2008-09-06 | Discharge: 2008-09-06 | Payer: Self-pay | Admitting: Oncology

## 2009-01-07 ENCOUNTER — Encounter: Admission: RE | Admit: 2009-01-07 | Discharge: 2009-01-07 | Payer: Self-pay

## 2009-02-05 ENCOUNTER — Ambulatory Visit: Payer: Self-pay | Admitting: Internal Medicine

## 2009-02-18 ENCOUNTER — Emergency Department (HOSPITAL_COMMUNITY): Admission: EM | Admit: 2009-02-18 | Discharge: 2009-02-18 | Payer: Self-pay | Admitting: Emergency Medicine

## 2009-02-21 ENCOUNTER — Ambulatory Visit: Payer: Self-pay | Admitting: Internal Medicine

## 2009-02-25 ENCOUNTER — Encounter (HOSPITAL_COMMUNITY): Admission: RE | Admit: 2009-02-25 | Discharge: 2009-03-15 | Payer: Self-pay | Admitting: Internal Medicine

## 2009-03-05 ENCOUNTER — Ambulatory Visit: Payer: Self-pay | Admitting: Internal Medicine

## 2009-05-13 ENCOUNTER — Ambulatory Visit: Payer: Self-pay | Admitting: Internal Medicine

## 2009-08-20 ENCOUNTER — Ambulatory Visit: Payer: Self-pay | Admitting: Oncology

## 2009-08-22 LAB — CBC WITH DIFFERENTIAL/PLATELET
BASO%: 0.4 % (ref 0.0–2.0)
Eosinophils Absolute: 0 10*3/uL (ref 0.0–0.5)
HCT: 35.3 % (ref 34.8–46.6)
LYMPH%: 23 % (ref 14.0–49.7)
MCHC: 33.7 g/dL (ref 31.5–36.0)
MCV: 86.6 fL (ref 79.5–101.0)
MONO#: 0.4 10*3/uL (ref 0.1–0.9)
MONO%: 5.9 % (ref 0.0–14.0)
NEUT%: 70.1 % (ref 38.4–76.8)
Platelets: 237 10*3/uL (ref 145–400)
RBC: 4.08 10*6/uL (ref 3.70–5.45)
WBC: 6.7 10*3/uL (ref 3.9–10.3)

## 2009-08-22 LAB — COMPREHENSIVE METABOLIC PANEL
ALT: 12 U/L (ref 0–35)
AST: 14 U/L (ref 0–37)
Albumin: 4.1 g/dL (ref 3.5–5.2)
CO2: 26 mEq/L (ref 19–32)
Calcium: 9.1 mg/dL (ref 8.4–10.5)
Creatinine, Ser: 0.86 mg/dL (ref 0.40–1.20)
Glucose, Bld: 111 mg/dL — ABNORMAL HIGH (ref 70–99)
Sodium: 140 mEq/L (ref 135–145)
Total Protein: 7.1 g/dL (ref 6.0–8.3)

## 2009-08-22 LAB — VITAMIN D 25 HYDROXY (VIT D DEFICIENCY, FRACTURES): Vit D, 25-Hydroxy: 20 ng/mL — ABNORMAL LOW (ref 30–89)

## 2010-01-31 ENCOUNTER — Ambulatory Visit: Payer: Self-pay | Admitting: Internal Medicine

## 2010-03-13 ENCOUNTER — Ambulatory Visit: Payer: Self-pay | Admitting: Oncology

## 2010-03-19 LAB — CBC WITH DIFFERENTIAL/PLATELET
BASO%: 0.3 % (ref 0.0–2.0)
Basophils Absolute: 0 10*3/uL (ref 0.0–0.1)
EOS%: 1.2 % (ref 0.0–7.0)
Eosinophils Absolute: 0.1 10*3/uL (ref 0.0–0.5)
HCT: 36.7 % (ref 34.8–46.6)
HGB: 12.7 g/dL (ref 11.6–15.9)
LYMPH%: 24.8 % (ref 14.0–49.7)
MCH: 29.6 pg (ref 25.1–34.0)
MCHC: 34.6 g/dL (ref 31.5–36.0)
MCV: 85.7 fL (ref 79.5–101.0)
MONO#: 0.5 10*3/uL (ref 0.1–0.9)
MONO%: 6.9 % (ref 0.0–14.0)
NEUT#: 4.5 10*3/uL (ref 1.5–6.5)
NEUT%: 66.8 % (ref 38.4–76.8)
Platelets: 265 10*3/uL (ref 145–400)
RBC: 4.28 10*6/uL (ref 3.70–5.45)
RDW: 13.4 % (ref 11.2–14.5)
WBC: 6.7 10*3/uL (ref 3.9–10.3)
lymph#: 1.7 10*3/uL (ref 0.9–3.3)

## 2010-03-20 LAB — VITAMIN D 25 HYDROXY (VIT D DEFICIENCY, FRACTURES): Vit D, 25-Hydroxy: 17 ng/mL — ABNORMAL LOW (ref 30–89)

## 2010-03-20 LAB — COMPREHENSIVE METABOLIC PANEL
ALT: 11 U/L (ref 0–35)
AST: 15 U/L (ref 0–37)
Albumin: 4.3 g/dL (ref 3.5–5.2)
Alkaline Phosphatase: 72 U/L (ref 39–117)
BUN: 10 mg/dL (ref 6–23)
CO2: 25 mEq/L (ref 19–32)
Calcium: 9.2 mg/dL (ref 8.4–10.5)
Chloride: 105 mEq/L (ref 96–112)
Creatinine, Ser: 0.6 mg/dL (ref 0.40–1.20)
Glucose, Bld: 78 mg/dL (ref 70–99)
Potassium: 3.9 mEq/L (ref 3.5–5.3)
Sodium: 141 mEq/L (ref 135–145)
Total Bilirubin: 0.4 mg/dL (ref 0.3–1.2)
Total Protein: 7.5 g/dL (ref 6.0–8.3)

## 2010-03-20 LAB — CANCER ANTIGEN 27.29: CA 27.29: 14 U/mL (ref 0–39)

## 2010-04-06 ENCOUNTER — Encounter: Payer: Self-pay | Admitting: Internal Medicine

## 2010-04-06 ENCOUNTER — Encounter: Payer: Self-pay | Admitting: Oncology

## 2010-04-07 ENCOUNTER — Encounter: Payer: Self-pay | Admitting: Oncology

## 2010-05-15 ENCOUNTER — Emergency Department (HOSPITAL_COMMUNITY): Payer: Medicare Other

## 2010-05-15 ENCOUNTER — Encounter (HOSPITAL_COMMUNITY): Payer: Self-pay | Admitting: Radiology

## 2010-05-15 ENCOUNTER — Observation Stay (HOSPITAL_COMMUNITY)
Admission: EM | Admit: 2010-05-15 | Discharge: 2010-05-16 | Disposition: A | Discharge status: Home / Self Care | Payer: Medicare Other | Attending: Internal Medicine | Admitting: Internal Medicine

## 2010-05-15 DIAGNOSIS — M459 Ankylosing spondylitis of unspecified sites in spine: Secondary | ICD-10-CM | POA: Insufficient documentation

## 2010-05-15 DIAGNOSIS — R4182 Altered mental status, unspecified: Secondary | ICD-10-CM | POA: Insufficient documentation

## 2010-05-15 DIAGNOSIS — IMO0002 Reserved for concepts with insufficient information to code with codable children: Secondary | ICD-10-CM | POA: Insufficient documentation

## 2010-05-15 DIAGNOSIS — J189 Pneumonia, unspecified organism: Principal | ICD-10-CM | POA: Insufficient documentation

## 2010-05-15 DIAGNOSIS — Z853 Personal history of malignant neoplasm of breast: Secondary | ICD-10-CM | POA: Insufficient documentation

## 2010-05-15 DIAGNOSIS — I059 Rheumatic mitral valve disease, unspecified: Secondary | ICD-10-CM | POA: Insufficient documentation

## 2010-05-15 DIAGNOSIS — E041 Nontoxic single thyroid nodule: Secondary | ICD-10-CM | POA: Insufficient documentation

## 2010-05-15 DIAGNOSIS — I10 Essential (primary) hypertension: Secondary | ICD-10-CM | POA: Insufficient documentation

## 2010-05-15 DIAGNOSIS — R079 Chest pain, unspecified: Secondary | ICD-10-CM | POA: Insufficient documentation

## 2010-05-15 HISTORY — DX: Essential (primary) hypertension: I10

## 2010-05-15 LAB — COMPREHENSIVE METABOLIC PANEL
ALT: 13 U/L (ref 0–35)
Albumin: 4.1 g/dL (ref 3.5–5.2)
Alkaline Phosphatase: 71 U/L (ref 39–117)
BUN: 6 mg/dL (ref 6–23)
Chloride: 110 mEq/L (ref 96–112)
Glucose, Bld: 88 mg/dL (ref 70–99)
Potassium: 3.9 mEq/L (ref 3.5–5.1)
Sodium: 142 mEq/L (ref 135–145)
Total Bilirubin: 0.6 mg/dL (ref 0.3–1.2)

## 2010-05-15 LAB — CARDIAC PANEL(CRET KIN+CKTOT+MB+TROPI)
CK, MB: 1.2 ng/mL (ref 0.3–4.0)
Troponin I: 0.01 ng/mL (ref 0.00–0.06)

## 2010-05-15 LAB — POCT I-STAT, CHEM 8
BUN: 7 mg/dL (ref 6–23)
Calcium, Ion: 1.07 mmol/L — ABNORMAL LOW (ref 1.12–1.32)
Creatinine, Ser: 0.7 mg/dL (ref 0.4–1.2)
Glucose, Bld: 89 mg/dL (ref 70–99)
TCO2: 21 mmol/L (ref 0–100)

## 2010-05-15 LAB — CBC
MCH: 28.7 pg (ref 26.0–34.0)
MCHC: 33.6 g/dL (ref 30.0–36.0)
Platelets: 224 10*3/uL (ref 150–400)

## 2010-05-15 LAB — RAPID URINE DRUG SCREEN, HOSP PERFORMED
Amphetamines: NOT DETECTED
Barbiturates: NOT DETECTED
Benzodiazepines: NOT DETECTED
Cocaine: NOT DETECTED
Opiates: POSITIVE — AB

## 2010-05-15 LAB — URINALYSIS, ROUTINE W REFLEX MICROSCOPIC
Hgb urine dipstick: NEGATIVE
Protein, ur: NEGATIVE mg/dL
Urine Glucose, Fasting: NEGATIVE mg/dL
pH: 6.5 (ref 5.0–8.0)

## 2010-05-15 LAB — PROTIME-INR: INR: 1.04 (ref 0.00–1.49)

## 2010-05-15 LAB — DIFFERENTIAL
Basophils Relative: 1 % (ref 0–1)
Eosinophils Absolute: 0.1 10*3/uL (ref 0.0–0.7)
Eosinophils Relative: 1 % (ref 0–5)
Monocytes Absolute: 0.5 10*3/uL (ref 0.1–1.0)
Monocytes Relative: 9 % (ref 3–12)

## 2010-05-15 LAB — POCT CARDIAC MARKERS

## 2010-05-15 LAB — CK TOTAL AND CKMB (NOT AT ARMC): Total CK: 78 U/L (ref 7–177)

## 2010-05-15 LAB — TSH: TSH: 0.044 u[IU]/mL — ABNORMAL LOW (ref 0.350–4.500)

## 2010-05-15 MED ORDER — IOHEXOL 350 MG/ML SOLN
80.0000 mL | Freq: Once | INTRAVENOUS | Status: AC | PRN
  Administered 2010-05-15: 80 mL via INTRAVENOUS

## 2010-05-15 NOTE — H&P (Signed)
NAMEANESIA, BLACKWELL                ACCOUNT NO.:  [id number]  MEDICAL RECORD NO.:  [id number]           PATIENT TYPE:  E  LOCATION:  [facility]                         FACILITY:  [facility]  PHYSICIAN:  Conley Canal, MD      DATE OF BIRTH:  31-Jul-1965  DATE OF ADMISSION:  05/15/2010 DATE OF DISCHARGE:                             HISTORY & PHYSICAL   CARDIOLOGIST:  Nanetta Batty, MD.  CHIEF COMPLAINT:  Chest pain of 2 days' duration.  HISTORY OF PRESENT ILLNESS:  Mrs. Yinger is a 45 year old female with history of mitral valve prolapse, ankylosing spondylitis, breast cancer, hypertension, status post mastectomy, followed by Dr. Allyson Sabal for the mitral valve prolapse, who comes in with complaints of left-sided chest pain, which started yesterday.  The patient states that she had been working in her yard and noticed that she had this heavy sensation on the chest on the left side and she could not move because of the pain.  The pain was pleuritic in nature.  She mentions that she has had nasal congestion like symptoms for the last 1 week also with no fever.  No hemoptysis.  She denies any diaphoresis.  Otherwise, her pain is aggravated with any slight movement.  There was concern for pulmonary embolism given her previous history of breast cancer and hence CT of the chest with contrast was done, which did not show any evidence of pulmonary embolism.  However, it showed some bibasilar collapse, stroke, consolidative change, and an indeterminate right-sided thyroid nodule. She was hence given ceftriaxone and azithromycin and referred to the hospitalist service for further management.  PAST MEDICAL HISTORY: 1. Mitral valve prolapse. 2. Ankylosing spondylitis. 3. Hypertension. 4. History of breast cancer status post mastectomy.  ALLERGIES:  SHELLFISH.  HOME MEDICATIONS:  Lisinopril and Lopressor.  FAMILY HISTORY:  Negative for premature coronary artery disease.  REVIEW OF SYSTEMS:   Unremarkable except as highlighted in the history of present illness.  SOCIAL HISTORY:  The patient lives alone.  Denies cigarette smoking, alcohol, or illicit drugs.  PHYSICAL EXAMINATION:  GENERAL:  This is a young lady, she is in obvious discomfort. VITALS:  Blood pressure 133/84, heart rate 61, temperature is 98, respirations 18, oxygen saturation is 100% on 2 L nasal cannula. HEENT:  Head, ears, nose and throat examination, pupils equal and reacting to light. NECK:  No jugular venous distention.  No carotid bruits. RESPIRATORY SYSTEM:  Bilateral air entry.  Some basilar rhonchi.  No wheezing. CARDIOVASCULAR SYSTEM:  First and second heart sounds heard.  No murmurs.  Pulse regular. ABDOMEN:  Soft and nontender.  No palpable organomegaly.  Bowel sounds are normal. CNS:  The patient is alert, oriented in person, place, and time with no acute focal neurological deficits. EXTREMITIES:  There is no pedal edema.  Peripheral pulses are equal.  LABORATORY DATA:  Labs were reviewed significant for CBC normal, CMP normal, cardiac enzymes negative x2.  Urine drug screen positive for opiates.  Urinalysis unremarkable.  CT of chest described above.  EKG shows normal sinus rhythm with no significant ST-T wave changes.  IMPRESSION:  A 45 year old female with  history of breast cancer, hypertension, mitral valve prolapse, who was presenting with chest pain. The differential diagnosis is early community-acquired pneumonia versus acute coronary syndrome.  The presentation favors early community- acquired pneumonia with atelectasis.  PLAN: 1. Chest pain.  The patient will be admitted to telemetry, rule out MI     protocol will be instituted with serial cardiac enzymes.  Also     obtain 2-D echocardiogram.  Obtain records from Dr. Hazle Coca office.     She will be given nitroglycerin as needed, who planned to continue     beta-blocker, ACE inhibitor.  Check lipids panel.  She will also be      covered with ceftriaxone and azithromycin, incentive spirometer,     bronchodilators as needed.  Consider Pulmonary if no improvement in     pulmonary picture. 2. Hypertension, seems controlled.  Resume home medications. 3. Mitral valve prolapse.  Obtain 2-D echocardiogram.  Obtain records     from Dr. Hazle Coca office. 4. Thyroid nodule.  This has been investigated previously from     December 2010.  We would defer workup to her primary care     physician. 5. DVT, GI prophylaxis.     Conley Canal, MD     SR/MEDQ  D:  05/15/2010  T:  05/15/2010  Job:  [id number]  cc:   Nanetta Batty, M.D.  Electronically Signed by Conley Canal  on 05/15/2010 08:28:32 PM

## 2010-05-16 LAB — CARDIAC PANEL(CRET KIN+CKTOT+MB+TROPI)
CK, MB: 1.1 ng/mL (ref 0.3–4.0)
Relative Index: INVALID (ref 0.0–2.5)
Total CK: 57 U/L (ref 7–177)

## 2010-05-16 LAB — URINE CULTURE
Colony Count: NO GROWTH
Culture  Setup Time: 201203012358
Culture: NO GROWTH

## 2010-05-16 LAB — LIPID PANEL
Triglycerides: 62 mg/dL (ref <150)
VLDL: 12 mg/dL (ref 0–40)

## 2010-05-22 ENCOUNTER — Ambulatory Visit (INDEPENDENT_AMBULATORY_CARE_PROVIDER_SITE_OTHER): Payer: Medicare Other | Admitting: Internal Medicine

## 2010-05-22 DIAGNOSIS — E039 Hypothyroidism, unspecified: Secondary | ICD-10-CM

## 2010-05-22 DIAGNOSIS — H109 Unspecified conjunctivitis: Secondary | ICD-10-CM

## 2010-05-22 DIAGNOSIS — J309 Allergic rhinitis, unspecified: Secondary | ICD-10-CM

## 2010-05-25 NOTE — Discharge Summary (Signed)
Sonya Franklin, Sonya Franklin                ACCOUNT NO.:  [id number]  MEDICAL RECORD NO.:  [id number]           PATIENT TYPE:  I  LOCATION:  3728                         FACILITY:  [facility]  PHYSICIAN:  Lonia Blood, M.D.       DATE OF BIRTH:  02-27-1966  DATE OF ADMISSION:  05/15/2010 DATE OF DISCHARGE:  05/16/2010                              DISCHARGE SUMMARY   PRIMARY CARE PHYSICIAN:  Luanna Cole. Lenord Fellers, MD  PRIMARY CARDIOLOGIST:  Nanetta Batty, MD  DISCHARGE DIAGNOSES:  1..  Pneumonia, felt to be toxic allergic in nature rather than infectious. 1. Mitral valve prolapse. 2. Ankylosing spondylitis. 3. Hypertension. 4. History of breast cancer, status post mastectomy.  DISCHARGE MEDICATIONS: 1. Albuterol 2 puffs inhaled 4 times a day as needed for shortness of     breath. 2. Hydrocodone/APAP 5/325 one to two tablets every 4 hours as needed     for severe pain. 3. Lisinopril 20 mg daily. 4. Prednisone 20 mg daily for 5 days. 5. Toprol-XL 25 mg daily. 6. Vitamin D 1000 units daily. 7. Azithromycin 500 mg daily for 5 days.  CONDITION ON DISCHARGE:  Sonya Franklin was discharged in good condition. At the time of discharge, oxygen saturations were 97% on room, temperature 98.2, respirations were 15, and heart rate was 68.  The patient was instructed to follow up with her primary care physician, Dr. Eden Emms Baxley and with Dr. Nanetta Batty from Cardiology.  PROCEDURE THIS ADMISSION:  The patient underwent CT angiogram of the chest which was negative for pulmonary emboli, positive for some consolidative changes/collapse representing either atelectasis or bilateral infiltrates, re-demonstration of a right-sided thyroid nodule.  CONSULTATION THIS ADMISSION:  The patient was seen in consultation by Dr. Royann Shivers from [facility].  HISTORY AND PHYSICAL:  Refer to H and P done by Dr. Venetia Constable.  Briefly, Sonya Franklin is a 45 year old woman with history of mitral valve prolapse  who presented to the emergency with complaints of shortness of breath and sharp chest pains.  She said that this started after she did a lot of weeding in the yard and also sprayed the bathroom with some chemical. In the emergency room, the patient was evaluated with a CT angiogram of the chest which was negative for pulmonary emboli and she was placed on observation to further see her course of care.  HOSPITAL COURSE:  Sonya Franklin was observed on telemetry overnight.  She had 3 sets of cardiac enzymes which were all within normal limits.  She was seen the next day by the consultant cardiologist who felt that it is unlikely that this chest pain was cardiac in nature.  Auscultation of the mitral valve did not reveal any severe mitral regurgitation or murmurs.  The patient's story combined with a CT scan of the chest findings suggests that she probably had an allergic/chemical-induced pneumonitis, most likely due to inhalation of the allergens after the weeding process and inhalation of the chemicals after the shower.  In any case, Sonya Franklin was showing signs of improvement in the hospital on some antibiotics.  At the interim, I do  not think she had bacterial pneumonia.  Nevertheless, I think a short course of steroids and azithromycin for 5 days will speed up the recovery process which will be at the end actually speedy.  Sonya Franklin will follow up with her primary care physician and her cardiologist if further issues arise.     Lonia Blood, M.D.     SL/MEDQ  D:  05/19/2010  T:  05/19/2010  Job:  [id number]  cc:   Luanna Cole. Lenord Fellers, M.D. Nanetta Batty, M.D.  Electronically Signed by Lonia Blood M.D. on 05/25/2010 08:54:12 AM

## 2010-06-17 LAB — CBC
Hemoglobin: 11.9 g/dL — ABNORMAL LOW (ref 12.0–15.0)
MCHC: 33.8 g/dL (ref 30.0–36.0)
Platelets: 252 10*3/uL (ref 150–400)
RDW: 13.1 % (ref 11.5–15.5)

## 2010-06-17 LAB — BASIC METABOLIC PANEL
BUN: 9 mg/dL (ref 6–23)
CO2: 25 mEq/L (ref 19–32)
Calcium: 8.8 mg/dL (ref 8.4–10.5)
Creatinine, Ser: 0.58 mg/dL (ref 0.4–1.2)
GFR calc non Af Amer: >60 mL/min (ref >60)
Glucose, Bld: 108 mg/dL — ABNORMAL HIGH (ref 70–99)

## 2010-06-17 LAB — DIFFERENTIAL
Basophils Absolute: 0 10*3/uL (ref 0.0–0.1)
Basophils Relative: 0 % (ref 0–1)
Lymphocytes Relative: 32 % (ref 12–46)
Monocytes Absolute: 0.7 10*3/uL (ref 0.1–1.0)
Neutro Abs: 4.3 10*3/uL (ref 1.7–7.7)

## 2010-06-17 LAB — POCT CARDIAC MARKERS
CKMB, poc: <1 ng/mL — ABNORMAL LOW (ref 1.0–8.0)
CKMB, poc: <1 ng/mL — ABNORMAL LOW (ref 1.0–8.0)
Myoglobin, poc: 69.1 ng/mL (ref 12–200)
Myoglobin, poc: 86.1 ng/mL (ref 12–200)

## 2010-06-17 LAB — D-DIMER, QUANTITATIVE: D-Dimer, Quant: <0.22 ug/mL-FEU (ref 0.00–0.48)

## 2010-07-29 NOTE — Discharge Summary (Signed)
NAMEFLOETTA, Franklin                ACCOUNT NO.:  [id number]   MEDICAL RECORD NO.:  [id number]          PATIENT TYPE:  INP   LOCATION:  9309                          FACILITY:  [facility]   PHYSICIAN:  Miguel Aschoff, M.D.       DATE OF BIRTH:  10-15-1965   DATE OF ADMISSION:  07/12/2007  DATE OF DISCHARGE:  07/14/2007                               DISCHARGE SUMMARY   ADMISSION DIAGNOSES:  Symptomatic uterine fibroids, pelvic pain, and  history of breast cancer.   FINAL DIAGNOSES:  Symptomatic uterine fibroids, pelvic pain, and history  of breast cancer with uterine fibroids.   OPERATIONS AND PROCEDURES:  Total abdominal hysterectomy and bilateral  salpingo-oophorectomy.   BRIEF HISTORY:  The patient is a 45 year old black female with a history  of invasive breast cancer who is currently on tamoxifen therapy.  The  patient was noted to have a significant pelvic mass causing her pelvic  pain and pressure and on evaluation, this was found to be consistent  with large uterine fibroids.  The options of treatment were discussed  with the patient and she has elected to undergo a definitive therapy via  total abdominal hysterectomy and due to her breast cancer, she had  bilateral salpingo-oophorectomy.  The risks and benefits of the  procedure were discussed with the patient, informed consent was obtained  and the patient was taken to the operating room on July 12, 2007, to  undergo a total abdominal hysterectomy and bilateral salpingo-  oophorectomy.  This procedure was carried out without difficulty.  Findings at surgery did not show any significant intra-abdominal  abnormalities.  The markedly enlarged uterus equivalent to 14- to 16-  week gestational size uterus with uterine myomas present.  Her tubes and  ovaries were otherwise within normal limits.  The patient tolerated the  surgical procedure well.  Her hospital course was essentially  unremarkable.  She tolerated increasing ambulation and  diet without  difficulty.  By the second postoperative day, she was able to be  discharged home.  She was instructed do no heavy lifting, place nothing  in vagina and to call if there are any problems such as fever, pain or  heavy bleeding.  Her hemoglobin on discharge was 11.8.  Her admission  hemoglobin was 12.0.  The only remarkable lab finding while the patient  was in the hospital was that of her admission potassium was 3.1 and the  patient was given potassium in her IV fluids.  Final pathology report  did not reveal any significant abnormalities of the tubes and ovaries,  and significant abnormalities were related to the uterus and the  findings were consistent with leiomyomata.   The patient is to be sent home.  She will be seen back on Jul 20, 2007,  for removal of her staples.  She is to do no heavy lifting, place  nothing in vagina and call if there are any problems such as fever, pain  or heavy bleeding.  She is to follow up with Dr. Darnelle Catalan for her  continued breast cancer therapy and discussion with Dr.  Magrinat whether  the tamoxifen need to be continued, now that her ovaries have been  removed.      Miguel Aschoff, M.D.  Electronically Signed    AR/MEDQ  D:  07/14/2007  T:  07/15/2007  Job:  [id number]   cc:   Valentino Hue. Magrinat, M.D.  Fax: [phone]

## 2010-07-29 NOTE — Op Note (Signed)
Sonya Franklin, Sonya Franklin                ACCOUNT NO.:  [id number]   MEDICAL RECORD NO.:  [id number]          PATIENT TYPE:  INP   LOCATION:  9309                          FACILITY:  [facility]   PHYSICIAN:  Miguel Aschoff, M.D.       DATE OF BIRTH:  1965-05-24   DATE OF PROCEDURE:  07/12/2007  DATE OF DISCHARGE:                               OPERATIVE REPORT   PREOPERATIVE DIAGNOSIS:  1. Uterine fibroids.  2. History of breast cancer on tamoxifen therapy.   POSTOPERATIVE DIAGNOSIS:  1. Uterine fibroids.  2. History of breast cancer on tamoxifen therapy.   PROCEDURE:  Total abdominal hysterectomy, bilateral salpingo-  oophorectomy.   SURGEON:  Miguel Aschoff, MD   ASSISTANT:  Luvenia Redden, MD   ANESTHESIA:  General.   COMPLICATIONS:  None.   INDICATIONS:  The patient is a 45 year old black female with history of  breast cancer currently on tamoxifen therapy.  In addition, the patient  was noted have a pelvic mass consistent with uterine fibroids  approximately 14 weeks' equivalent size causing pelvic pressure and  pain.  Because of the uterine fibroids and associated symptoms, the  patient presents now to undergo definitive therapy via total abdominal  hysterectomy and due for also taking tamoxifen because of history of  breast cancer.  She is also to undergo bilateral salpingo-oophorectomy.  The risks and benefits of this procedure were discussed with the patient  and informed consent has been obtained.   PROCEDURE:  The patient was taken to the operating room, placed in the  supine position, and general anesthesia was administered without  difficulty.  She was then placed in a modified lithotomy position,  prepped and draped in usual sterile fashion.  Foley catheter was  inserted.  She was then returned to the supine position.  At this point,  a Pfannenstiel incision was made, extended down through the subcutaneous  tissue with bleeding points being clamped and coagulated as they were  encountered.  The fascia was then identified and incised transversely  and then separated from the underlying rectus muscles.  The rectus  muscles were divided in midline.  Peritoneum was then found and entered  carefully in the underlying structures.  At this point, a self-retaining  retractor was placed through the wound and pelvic washings for cytology  were taken to ensure that there was no evidence of any malignant cells  within the confines of the abdominal cavity.  After this, we thoroughly  packed out the pelvis.  Inspection revealed the uterus to be globular  and approximately 14 weeks' equivalent size.  The ovaries appeared be  unremarkable.  There was no abnormal findings noted in the pelvis except  the significant uterine enlargement.  At this point, the round ligaments  were identified, suture ligated, and then cut.  A bladder flap was then  created anteriorly and draped.  Perforations were made below the  infundibulopelvic ligament.  The uterus were identified with the ureters  out of the field.  Infundibulopelvic ligaments were clamped, cut, and  suture ligated using ligatures of 0 Vicryl followed  by free ties of 0  Vicryl.  Broad ligaments then skeletonized and then the uterine vessels  were found, clamped with curved Heaney clamps.  These pedicles were cut  and suture ligated using suture ligatures of 0 Vicryl.  Then serial  bites were taken of paracervical fascia using straight Heaney clamps.  All these pedicles were cut and suture ligated using suture ligatures of  0 Vicryl.  Once this was done, the uterosacral ligaments and the  cardinal ligaments were clamped using curved Heaney clamps.  Again, the  pedicles were being suture ligated using suture ligatures of 0 Vicryl.  At this point, the vaginal vault was entered and the cervix was cut free  from the vaginal fornices.  At this point, the vaginal cuff was closed  using running interlocking 0 Vicryl suture.  The bed of  the operative  site was then inspected.  There appeared be excellent hemostasis.  The  pelvis was irrigated with warm saline.  Lap counts and instrument counts  were taken and found to be correct, and then the abdomen was closed.  The peritoneum was closed using running continuous 0 Vicryl suture.  The  rectus muscles were reapproximated using running continuous 0 Vicryl  suture.  The fascia was closed using two sutures of 0 Vicryl, each  suture starting at lateral fascial angles and meeting in midline.  Subcutaneous tissue was closed using interrupted 0 Vicryl sutures and  the skin incision was closed using staples.  The estimated blood loss  was approximately 150 mL.  The patient tolerated the procedure well and  went to recovery room in satisfactory condition.      Miguel Aschoff, M.D.  Electronically Signed     AR/MEDQ  D:  07/12/2007  T:  07/12/2007  Job:  [id number]

## 2010-08-01 NOTE — Op Note (Signed)
Sonya Franklin, Sonya Franklin                ACCOUNT NO.:  [id number]   MEDICAL RECORD NO.:  [id number]          PATIENT TYPE:  AMB   LOCATION:  [facility]SC                          FACILITY:  [facility]   PHYSICIAN:  Rose Phi. Maple Hudson, M.D.   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   OPERATION PERFORMED:  1.  Blue dye injection.  2.  Right partial mastectomy with needle localization and specimen      mammogram.  3.  Right axillary sentinel lymph node biopsy.   SURGEON:  Rose Phi. Maple Hudson, M.D.   ANESTHESIA:  General.   DESCRIPTION OF PROCEDURE:  Prior to coming to the operating room, a  localizing wire had been placed in the lateral part of the right breast  where her previously diagnosed tumor existed.  In addition, 1 mCi of  technetium sulfur colloid was injected intradermally.   After suitable general anesthesia was introduced, the patient was placed in  supine position with the arms extended on the arm board.  5 mL of a mixture  of 2 mL of methylene blue and 3 mL of injectable saline was injected in the  subareolar tissue and the breast gently massaged.  We then prepped and  draped the breast and axilla.   The localizing wire was placed at the 9 o'clock position of the right breast  and a curved incision using the wire as a guide was then outlined and then  the incision made and a wide excision of the wire and surrounding tissue was  carried out.  The specimen was submitted to the radiologist after orienting  the specimen for specimen mammogram and then to the pathologist for margins.   I felt that grossly the margins looked closed at the inferodeep and maybe  lateral area, so I then submitted some more tissue from there that was  excised and then submitted for permanents as  additional tissue from these  areas that looked close.   While those were being evaluated, a short transverse right axillary incision  was made with dissection through the subcutaneous tissue to the  clavipectoral fascia.  Deep to the fascia was one blue and hot lymph node  and one adjacent lymph node and they were removed as two sentinel nodes.  I  could not identify any other blue, hot or palpable nodes.   The specimen mammogram confirmed the removal of the lesion and the  evaluation of the margins showed clear margins but they appeared very close  at the areas of concern where I excised additional tissue.  Sentinel node  was negative for metastatic disease.   Both incisions were infiltrated with 0.25% Marcaine and then closed in two  layers with 3-0 Vicryl and then subcuticular 4-0 Monocryl and Steri-Strips.  Dressings were applied and the patient transferred to the recovery room in  the satisfactory condition having tolerated  the procedure well.      Rose Phi. Maple Hudson, M.D.  Electronically Signed     PRY/MEDQ  D:  08/13/2005  T:  08/13/2005  Job:  [id number]

## 2010-08-01 NOTE — Op Note (Signed)
Sonya Franklin, Sonya Franklin                ACCOUNT NO.:  [id number]   MEDICAL RECORD NO.:  [id number]          PATIENT TYPE:  AMB   LOCATION:  DSC                          FACILITY:  [facility]   PHYSICIAN:  Rose Phi. Maple Hudson, M.D.   DATE OF BIRTH:  1965/05/07   DATE OF PROCEDURE:  09/01/2005  DATE OF DISCHARGE:                                 OPERATIVE REPORT   PREOPERATIVE DIAGNOSIS:  Carcinoma of the right breast.   POSTOPERATIVE DIAGNOSIS:  Carcinoma of the right breast.   OPERATION:  1.  Re-excision of right lumpectomy site, per margins.  2.  Port-A-Cath placement, under fluoroscopic control.   SURGEON:  Rose Phi. Maple Hudson, M.D.   ANESTHESIA:  General.   OPERATIVE PROCEDURE:  After suitable general anesthesia was induced, the  patient was placed in a supine position with the arms down by the sides.  We  prepped and draped the left upper chest and neck.   A left subclavian puncture was carried out without difficulty and a  guidewire inserted and proper positioning confirmed by fluoroscopy.   We then made an incision on the anterior chest wall and developed a pocket  for the implantable port.  I tunneled between the subclavian puncture site  and the pocket and passed the catheter through that and connected it to the  port and placed the port in the pocket.  I trimmed the catheter tip to go to  the level of the 4th interspace, measuring it out on the chest wall.   The dilator and peel-away sheath were then passed over the wire and the wire  was removed, followed by the dilator, and the catheter was passed through  the peel-away sheath, and then it was removed.   Again, fluoroscopy confirmed that the catheter tip was in the superior vena  cava and there was no kinking or crimping of the system.   The incisions were closed with 3-0 Vicryl and subcuticular 4-0 Monocryl,  after anchoring the port in the pocket with two 2-0 Prolene sutures.   We then accessed it with a Huber needle, and I both  aspirated and full  heparinized it.  It aspirated and irrigated easily.   We then took all of the drapes off and repositioned the patient with the  arms extended on the arm board.  The right breast and axilla were then  prepped and draped in the usual fashion.   The curved incision, centered in the lateral part of the right breast was  then incised and extended at either end.  I then entered the seroma cavity  and suctioned it dry and then did a wide excision of all of this tissue.  Specimens  oriented for the pathologist.  Hemostasis was obtained with the cautery.  The skin was closed with interrupted 4-0 nylon sutures.  Dressings were  applied and the patient was transferred to the recovery room in satisfactory  condition, having tolerated the procedure well.      Rose Phi. Maple Hudson, M.D.  Electronically Signed     PRY/MEDQ  D:  09/01/2005  T:  09/01/2005  Job:  [id number]

## 2010-08-01 NOTE — Op Note (Signed)
Sonya Franklin, Sonya Franklin                ACCOUNT NO.:  [id number]   MEDICAL RECORD NO.:  [id number]          PATIENT TYPE:  AMB   LOCATION:  DSC                          FACILITY:  [facility]   PHYSICIAN:  Rose Phi. Maple Hudson, M.D.   DATE OF BIRTH:  Apr 14, 1965   DATE OF PROCEDURE:  02/12/2006  DATE OF DISCHARGE:  02/12/2006                               OPERATIVE REPORT   PREOPERATIVE DIAGNOSIS:  Multicentric carcinoma of the right breast.   POSTOPERATIVE DIAGNOSIS:  Multicentric carcinoma of the right breast.   OPERATION:  1. Right total mastectomy.  2. Left prophylactic mastectomy.  3. Removal of Port-A-Cath.   SURGEON:  Rose Phi. Maple Hudson, M.D.   ANESTHESIA:  General.   OPERATIVE PROCEDURE:  This patient had presented originally and had a  lumpectomy and sentinel node biopsy.  She had a T2 lesion but had  multicentric disease, and we were unable to get clean margins.  She has  completed her chemotherapy and is scheduled now for right total  mastectomy.  She also desired the left one off.  She had seen plastic  surgery regarding a consultation for reconstructions and has decided to  do it later.   After suitable general anesthesia was induced, the patient was placed in  the supine position with the arms extended on the arm board.  We prepped  and draped entire chest with both breasts.   The right side was done first.  A transverse elliptical incision  incorporating the nipple-areolar complex and extending to the lumpectomy  site was then outlined and the incisions made.   The cautery was used to develop the flaps going superiorly to the  clavicle and medially to the medial border of the sternum and inferiorly  to the rectus fascia and laterally to latissimus dorsi muscle.  We then  removed the breast by dissecting from medial to lateral.  Following  this, we obtained hemostasis with the cautery.  We then thoroughly  irrigated the field with saline.  A 19-French Blake drain was inserted  and brought out through a separate stab wound.  The skin was closed with  staples.   We then turned our attention to the left side where a similar incision  was outlined and the incisions made and the flaps dissected in the  standard fashion.  While doing the upper flap, the Port-A-Cath was  removed.  Again, at the completion we had good hemostasis and put a  single 19 Blake drain in.  The skin was also closed with staples.   Dressings were then applied and the patient transferred to the recovery  room in satisfactory condition, having tolerated the procedure well.      Rose Phi. Maple Hudson, M.D.  Electronically Signed     PRY/MEDQ  D:  02/12/2006  T:  02/14/2006  Job:  [id number]

## 2010-08-21 ENCOUNTER — Other Ambulatory Visit: Payer: Self-pay | Admitting: Oncology

## 2010-08-21 ENCOUNTER — Encounter (HOSPITAL_BASED_OUTPATIENT_CLINIC_OR_DEPARTMENT_OTHER): Payer: Medicare Other | Admitting: Oncology

## 2010-08-21 DIAGNOSIS — Z170 Estrogen receptor positive status [ER+]: Secondary | ICD-10-CM

## 2010-08-21 DIAGNOSIS — C50919 Malignant neoplasm of unspecified site of unspecified female breast: Secondary | ICD-10-CM

## 2010-08-21 LAB — CBC WITH DIFFERENTIAL/PLATELET
BASO%: 0.1 % (ref 0.0–2.0)
Basophils Absolute: 0 10e3/uL (ref 0.0–0.1)
EOS%: 0.8 % (ref 0.0–7.0)
Eosinophils Absolute: 0.1 10e3/uL (ref 0.0–0.5)
HCT: 36.2 % (ref 34.8–46.6)
HGB: 12 g/dL (ref 11.6–15.9)
LYMPH%: 25 % (ref 14.0–49.7)
MCH: 27.8 pg (ref 25.1–34.0)
MCHC: 33.1 g/dL (ref 31.5–36.0)
MCV: 84 fL (ref 79.5–101.0)
MONO#: 0.5 10e3/uL (ref 0.1–0.9)
MONO%: 7.5 % (ref 0.0–14.0)
NEUT#: 4.7 10e3/uL (ref 1.5–6.5)
NEUT%: 66.6 % (ref 38.4–76.8)
Platelets: 225 10e3/uL (ref 145–400)
RBC: 4.31 10e6/uL (ref 3.70–5.45)
RDW: 13.2 % (ref 11.2–14.5)
WBC: 7.1 10e3/uL (ref 3.9–10.3)
lymph#: 1.8 10e3/uL (ref 0.9–3.3)

## 2010-08-22 LAB — COMPREHENSIVE METABOLIC PANEL
ALT: 15 U/L (ref 0–35)
AST: 16 U/L (ref 0–37)
Albumin: 4.6 g/dL (ref 3.5–5.2)
Alkaline Phosphatase: 71 U/L (ref 39–117)
BUN: 15 mg/dL (ref 6–23)
CO2: 24 mEq/L (ref 19–32)
Calcium: 9.5 mg/dL (ref 8.4–10.5)
Chloride: 105 mEq/L (ref 96–112)
Creatinine, Ser: 0.68 mg/dL (ref 0.50–1.10)
Glucose, Bld: 107 mg/dL — ABNORMAL HIGH (ref 70–99)
Potassium: 3.8 mEq/L (ref 3.5–5.3)
Sodium: 142 mEq/L (ref 135–145)
Total Bilirubin: 0.4 mg/dL (ref 0.3–1.2)
Total Protein: 7.6 g/dL (ref 6.0–8.3)

## 2010-08-22 LAB — CANCER ANTIGEN 27.29: CA 27.29: 14 U/mL (ref 0–39)

## 2010-08-28 ENCOUNTER — Encounter (HOSPITAL_BASED_OUTPATIENT_CLINIC_OR_DEPARTMENT_OTHER): Payer: Medicare Other | Admitting: Oncology

## 2010-08-28 DIAGNOSIS — Z853 Personal history of malignant neoplasm of breast: Secondary | ICD-10-CM

## 2010-09-02 ENCOUNTER — Encounter: Payer: Self-pay | Admitting: Internal Medicine

## 2010-09-02 ENCOUNTER — Ambulatory Visit (INDEPENDENT_AMBULATORY_CARE_PROVIDER_SITE_OTHER): Payer: Medicare Other | Admitting: Internal Medicine

## 2010-09-02 VITALS — BP 138/88 | HR 80 | Temp 98.6°F | Ht 61.0 in | Wt 160.0 lb

## 2010-09-02 DIAGNOSIS — E0590 Thyrotoxicosis, unspecified without thyrotoxic crisis or storm: Secondary | ICD-10-CM

## 2010-09-02 DIAGNOSIS — R5383 Other fatigue: Secondary | ICD-10-CM

## 2010-09-02 DIAGNOSIS — IMO0001 Reserved for inherently not codable concepts without codable children: Secondary | ICD-10-CM

## 2010-09-02 DIAGNOSIS — C50919 Malignant neoplasm of unspecified site of unspecified female breast: Secondary | ICD-10-CM

## 2010-09-02 DIAGNOSIS — I10 Essential (primary) hypertension: Secondary | ICD-10-CM | POA: Insufficient documentation

## 2010-09-02 DIAGNOSIS — M797 Fibromyalgia: Secondary | ICD-10-CM

## 2010-09-02 DIAGNOSIS — R5381 Other malaise: Secondary | ICD-10-CM

## 2010-09-02 DIAGNOSIS — E559 Vitamin D deficiency, unspecified: Secondary | ICD-10-CM | POA: Insufficient documentation

## 2010-09-02 LAB — TSH: TSH: 0.105 u[IU]/mL — ABNORMAL LOW (ref 0.350–4.500)

## 2010-09-02 NOTE — Patient Instructions (Signed)
Stop HCTZ. Stop Toprol. Take Lisinopril and Bystolic in the mornings. Return in one week for blood pressure check.

## 2010-09-02 NOTE — Progress Notes (Signed)
  Subjective:    Patient ID: Sonya Franklin, female    DOB: 1965-07-29, 45 y.o.   MRN: [id number]  HPI patient saw oncology nurse practitioner recently and her blood pressure was significantly elevated. She is not taking HCTZ on a daily basis but a when necessary basis. She has noticed a rash on her arms when sitting in the sun. That is most likely due to a photosensitivity reaction from HCTZ. Blood pressure today is 140/100 left arm. She's under a lot of stress, going through a divorce. Money is tight. She apparently has fallen into the doughnut hole with Medicare disability. Has not seen Dr. Lucianne Muss regarding mild hyperthyroidism since 2011. He had been following her levels and did not feel treatment was warranted since her case was mild. She quit taking tamoxifen about a year ago. She now is on generic Femara 2.5 mg daily. She wanted to be checked for lupus today. Hard someone speak about it when she was in the hospital at one point. Reminded her we had checked an ANA  in 2008 which was negative. We did check a free T3, free T4 and TSH today.    Review of Systems     Objective:   Physical Exam neck supple, without significant thyromegaly, no carotid bruits;   chest is clear to auscultation; cardiac exam regular rate and rhythm normal S1 and S2;   extremities without pitting edema         Assessment & Plan:      Essential hypertension exacerbated by anxiety  History of breast cancer  History of mild hyperthyroidism  Fibromyalgia syndrome  Anxiety  Plan: Instead of Toprol, patient will take Bystolic 10 mg daily, along with  lisinopril 10 mg daily. Hold HCTZ for now. Return in one week for blood pressure check.

## 2010-09-03 ENCOUNTER — Encounter: Payer: Self-pay | Admitting: Internal Medicine

## 2010-10-05 ENCOUNTER — Other Ambulatory Visit: Payer: Self-pay | Admitting: Internal Medicine

## 2010-10-07 ENCOUNTER — Telehealth: Payer: Self-pay

## 2010-10-08 NOTE — Telephone Encounter (Signed)
Patient calls back today, and is coming Thurs 10/09/10 for appt for bp check and uri

## 2010-10-09 ENCOUNTER — Encounter: Payer: Self-pay | Admitting: Internal Medicine

## 2010-10-09 ENCOUNTER — Ambulatory Visit (INDEPENDENT_AMBULATORY_CARE_PROVIDER_SITE_OTHER): Payer: Medicare Other | Admitting: Internal Medicine

## 2010-10-09 VITALS — BP 142/94 | HR 84 | Temp 97.7°F | Ht 62.0 in | Wt 161.0 lb

## 2010-10-09 DIAGNOSIS — H6593 Unspecified nonsuppurative otitis media, bilateral: Secondary | ICD-10-CM

## 2010-10-09 DIAGNOSIS — F419 Anxiety disorder, unspecified: Secondary | ICD-10-CM

## 2010-10-09 DIAGNOSIS — J309 Allergic rhinitis, unspecified: Secondary | ICD-10-CM

## 2010-10-09 DIAGNOSIS — H6590 Unspecified nonsuppurative otitis media, unspecified ear: Secondary | ICD-10-CM

## 2010-10-09 DIAGNOSIS — F411 Generalized anxiety disorder: Secondary | ICD-10-CM

## 2010-10-09 DIAGNOSIS — I10 Essential (primary) hypertension: Secondary | ICD-10-CM

## 2010-10-09 NOTE — Progress Notes (Signed)
  Subjective:    Patient ID: Sonya Franklin, female    DOB: [birth year], 45 y.o.   MRN: [id number]  HPI Here for BP check. BP 142/94. Pt prescribed Lisinopril, Bystolic 10 mg daily, HCTZ 25 mg daily,  Now not taking HCTZ. BP still not as well controlled as desired. Under a lot of stress with divorce. Also complaining of some ear congestion. History of allergic rhinitis. Refuses to take antianxiety medication saying she will simply deal with her story is    Review of Systems     Objective:   Physical Exam Chest clear; Cor RRR;  Ext:  Without edema. TMs slightly full bilaterally but not red. Pharynx is clear neck supple chest clear        Assessment & Plan:  HTN Anxiety  Allergic rhinitis  Serous otitis media Take HCTZ 25 mg daily; Increase Bystolic 20 mg daily. Advise Claritin over-the-counter for allergy/ear symptoms. She is return here in 3 weeks for 3 blood pressure check assuming she is compliant with all her medications. Samples of Bystolic given. New prescription for HCTZ 25 mg (#100) with when necessary 1 year refill given

## 2010-10-10 ENCOUNTER — Encounter: Payer: Self-pay | Admitting: Internal Medicine

## 2010-10-16 ENCOUNTER — Other Ambulatory Visit: Payer: Self-pay | Admitting: Internal Medicine

## 2010-10-31 ENCOUNTER — Telehealth: Payer: Self-pay

## 2010-10-31 ENCOUNTER — Other Ambulatory Visit: Payer: Self-pay | Admitting: Internal Medicine

## 2010-10-31 NOTE — Telephone Encounter (Signed)
Patient here for BP check today. 140/98 left arm. States she stopped taking her Bystolic after 2 days because the top of her head hurt and felt like it was burning. Resumed Lisinopril and Toprol

## 2010-10-31 NOTE — Telephone Encounter (Signed)
Per Dr. Lenord Fellers, patient to increase Lisinopril to 20mg  daily. New Rx faxed to [facility] [address].She is to also continue Toprol xl 50 mg daily and HCTZ 25mg  daily. She states the HCTZ makes her hands numb and gray, but is agreeable to taking 1/2 tablet daily. BP check in 3-4 weeks

## 2010-12-01 ENCOUNTER — Other Ambulatory Visit: Payer: Self-pay | Admitting: Oncology

## 2010-12-01 ENCOUNTER — Encounter (HOSPITAL_BASED_OUTPATIENT_CLINIC_OR_DEPARTMENT_OTHER): Payer: Medicare Other | Admitting: Oncology

## 2010-12-01 DIAGNOSIS — Z853 Personal history of malignant neoplasm of breast: Secondary | ICD-10-CM

## 2010-12-01 DIAGNOSIS — Z170 Estrogen receptor positive status [ER+]: Secondary | ICD-10-CM

## 2010-12-01 DIAGNOSIS — I10 Essential (primary) hypertension: Secondary | ICD-10-CM

## 2010-12-01 DIAGNOSIS — C50919 Malignant neoplasm of unspecified site of unspecified female breast: Secondary | ICD-10-CM

## 2010-12-02 LAB — VITAMIN D 25 HYDROXY (VIT D DEFICIENCY, FRACTURES): Vit D, 25-Hydroxy: 19 ng/mL — ABNORMAL LOW (ref 30–89)

## 2010-12-04 ENCOUNTER — Other Ambulatory Visit: Payer: Self-pay | Admitting: Oncology

## 2010-12-04 DIAGNOSIS — Z780 Asymptomatic menopausal state: Secondary | ICD-10-CM

## 2010-12-09 LAB — COMPREHENSIVE METABOLIC PANEL
ALT: 15
AST: 18
Albumin: 3.8
Chloride: 104
Creatinine, Ser: 0.62
GFR calc Af Amer: >60
Potassium: 3.1 — ABNORMAL LOW
Sodium: 135
Total Bilirubin: 0.5

## 2010-12-09 LAB — PREGNANCY, URINE: Preg Test, Ur: NEGATIVE

## 2010-12-09 LAB — URINALYSIS, ROUTINE W REFLEX MICROSCOPIC
Glucose, UA: NEGATIVE
Ketones, ur: NEGATIVE
Nitrite: NEGATIVE
Protein, ur: NEGATIVE
Urobilinogen, UA: 0.2

## 2010-12-09 LAB — APTT: aPTT: 27

## 2010-12-09 LAB — CBC
HCT: 34 — ABNORMAL LOW
Hemoglobin: 11.8 — ABNORMAL LOW
MCV: 86.8
Platelets: 276
Platelets: 287
RBC: 3.96
RBC: 4.08
RDW: 12.9
WBC: 13.3 — ABNORMAL HIGH
WBC: 7.9
WBC: 8.8

## 2010-12-09 LAB — PROTIME-INR: INR: 1

## 2010-12-10 ENCOUNTER — Ambulatory Visit
Admission: RE | Admit: 2010-12-10 | Discharge: 2010-12-10 | Disposition: A | Discharge status: Home / Self Care | Payer: Medicare Other | Source: Ambulatory Visit | Attending: Oncology | Admitting: Oncology

## 2010-12-10 DIAGNOSIS — Z780 Asymptomatic menopausal state: Secondary | ICD-10-CM

## 2011-01-15 ENCOUNTER — Other Ambulatory Visit: Payer: Self-pay | Admitting: Obstetrics and Gynecology

## 2011-01-15 ENCOUNTER — Other Ambulatory Visit (HOSPITAL_COMMUNITY)
Admission: RE | Admit: 2011-01-15 | Discharge: 2011-01-15 | Disposition: A | Discharge status: Home / Self Care | Payer: Medicare Other | Source: Ambulatory Visit | Attending: Obstetrics and Gynecology | Admitting: Obstetrics and Gynecology

## 2011-01-15 DIAGNOSIS — Z01419 Encounter for gynecological examination (general) (routine) without abnormal findings: Secondary | ICD-10-CM | POA: Insufficient documentation

## 2011-02-19 ENCOUNTER — Telehealth: Payer: Self-pay | Admitting: *Deleted

## 2011-02-19 NOTE — Telephone Encounter (Signed)
patient confirmed over the phone the new date and time on 03-18-2011 at 2:00 and 03-25-2011 at 2:00pm

## 2011-03-04 ENCOUNTER — Other Ambulatory Visit: Payer: Self-pay | Admitting: Internal Medicine

## 2011-03-04 ENCOUNTER — Other Ambulatory Visit: Payer: Self-pay

## 2011-03-04 MED ORDER — METOPROLOL SUCCINATE ER 50 MG PO TB24: 50.0000 mg | ORAL_TABLET | Freq: Two times a day (BID) | ORAL | Status: DC

## 2011-03-05 ENCOUNTER — Other Ambulatory Visit: Payer: Self-pay

## 2011-03-05 MED ORDER — LISINOPRIL 10 MG PO TABS: 10.0000 mg | ORAL_TABLET | Freq: Every day | ORAL | Status: DC

## 2011-03-18 ENCOUNTER — Other Ambulatory Visit (HOSPITAL_BASED_OUTPATIENT_CLINIC_OR_DEPARTMENT_OTHER): Payer: Medicare Other | Admitting: Lab

## 2011-03-18 ENCOUNTER — Other Ambulatory Visit: Payer: Self-pay | Admitting: Oncology

## 2011-03-18 DIAGNOSIS — C50919 Malignant neoplasm of unspecified site of unspecified female breast: Secondary | ICD-10-CM

## 2011-03-18 DIAGNOSIS — Z170 Estrogen receptor positive status [ER+]: Secondary | ICD-10-CM

## 2011-03-18 LAB — CBC WITH DIFFERENTIAL/PLATELET
Basophils Absolute: 0 10*3/uL (ref 0.0–0.1)
EOS%: 1.5 % (ref 0.0–7.0)
Eosinophils Absolute: 0.1 10*3/uL (ref 0.0–0.5)
HCT: 36.6 % (ref 34.8–46.6)
HGB: 12.4 g/dL (ref 11.6–15.9)
MCH: 29 pg (ref 25.1–34.0)
MCV: 86 fL (ref 79.5–101.0)
MONO%: 8 % (ref 0.0–14.0)
NEUT#: 3.9 10*3/uL (ref 1.5–6.5)
NEUT%: 65.6 % (ref 38.4–76.8)
RDW: 13.2 % (ref 11.2–14.5)

## 2011-03-19 LAB — COMPREHENSIVE METABOLIC PANEL
AST: 20 U/L (ref 0–37)
Albumin: 4.5 g/dL (ref 3.5–5.2)
Alkaline Phosphatase: 72 U/L (ref 39–117)
BUN: 11 mg/dL (ref 6–23)
Calcium: 9.5 mg/dL (ref 8.4–10.5)
Chloride: 105 mEq/L (ref 96–112)
Creatinine, Ser: 0.65 mg/dL (ref 0.50–1.10)
Glucose, Bld: 85 mg/dL (ref 70–99)
Potassium: 4.3 mEq/L (ref 3.5–5.3)

## 2011-03-19 LAB — VITAMIN D 25 HYDROXY (VIT D DEFICIENCY, FRACTURES): Vit D, 25-Hydroxy: 20 ng/mL — ABNORMAL LOW (ref 30–89)

## 2011-03-24 ENCOUNTER — Telehealth: Payer: Self-pay | Admitting: Oncology

## 2011-03-24 NOTE — Telephone Encounter (Signed)
per Val called pts home lmovm that the appt time on 1-09 was moved to 4:30 pm and ti rtn call to confirm

## 2011-03-25 ENCOUNTER — Telehealth: Payer: Self-pay | Admitting: Oncology

## 2011-03-25 ENCOUNTER — Ambulatory Visit: Payer: Medicare Other | Admitting: Oncology

## 2011-03-25 NOTE — Telephone Encounter (Signed)
called pt again lmovm and informed her that her appt today was changed to 4:30pm

## 2011-03-25 NOTE — Telephone Encounter (Signed)
pt called lmovm to cancel appt for today and r/s.  rtn call lmovm for 01/28 and to rtn confirm

## 2011-04-13 ENCOUNTER — Ambulatory Visit (HOSPITAL_BASED_OUTPATIENT_CLINIC_OR_DEPARTMENT_OTHER): Payer: Medicare Other | Admitting: Oncology

## 2011-04-13 VITALS — BP 163/107 | HR 94 | Temp 98.3°F | Ht 62.0 in | Wt 168.0 lb

## 2011-04-13 DIAGNOSIS — M899 Disorder of bone, unspecified: Secondary | ICD-10-CM

## 2011-04-13 DIAGNOSIS — C50919 Malignant neoplasm of unspecified site of unspecified female breast: Secondary | ICD-10-CM

## 2011-04-13 NOTE — Progress Notes (Signed)
ID: Sonya Franklin  DOB: 08-12-1965  MR#: [id number]  CSN#: [id number]   Interval History:  The patient returns for followup of her breast cancer. Interval history is generally unremarkable. She has started painting, and is very talented. She showed me several of her works, on [employer]. She tells me that she stopped almost all her medications about 6 months ago. That included the letrozole. She did not notice any change in vaginal dryness or hot flashes after stopping. Those had never been prominent symptoms in any case.  ROS:  Otherwise generally she is doing well. She is a little bit of a crick in her neck; this has been present 2 days. She has occasional reflux issues. Her skin is dry, and she bruises easily. She feels minimally anxious. Overall a detailed review of systems today was noncontributory  Allergies  Allergen Reactions  . Shellfish Allergy Anaphylaxis  . Blue Dyes (Parenteral)     Current Outpatient Prescriptions  Medication Sig Dispense Refill  . ibuprofen (ADVIL,MOTRIN) 800 MG tablet TAKE 1 TABLET BY MOUTH 3 TIMES A DAY  90 tablet  5  . lisinopril (PRINIVIL,ZESTRIL) 20 MG tablet TAKE 1 TABLET BY MOUTH DAILY  30 tablet  1  . metoprolol (TOPROL XL) 50 MG 24 hr tablet Take 1 tablet (50 mg total) by mouth 2 (two) times daily.  60 tablet  1   PAST MEDICAL HISTORY:  Significant for ankylosing spondylitis.  She has a history of GERD.  She had a prior right breast biopsy in November 2001 which showed fragments of a fibroadenoma.  FAMILY HISTORY: The patient's father has a history of of prostate cancer.  This is being followed closely.  He has one of six sisters with a history of breast cancer diagnosed in her late 57s.  There is no history of ovarian cancer in his family.  The patient's mother was diagnosed with breast cancer at age 73. I do not have further details on her breast cancer.  She also has one of three sisters with breast cancer who was diagnosed in that sister's mid 26s.   Jazzma has no sisters herself.  She has three brothers who are fine.  GYNECOLOGIC HISTORY:  She is GX, P1.  She was premenopausal at the time of her initial diagnosis.  SOCIAL HISTORY:  Marjan works in an office.  Her husband, [place], present today, runs his own pick up and delivery service.  Their son, Christiane Ha, is 61.  He has special needs and goes to [employer].  The patient is a Control and instrumentation engineer.    Objective:  Filed Vitals:   04/13/11 1449  BP: 163/107  Pulse: 94  Temp: 98.3 F (36.8 C)    BMI: Body mass index is 30.73 kg/(m^2).   ECOG FS: 0  Physical Exam:   Sclerae unicteric  Oropharynx clear  No peripheral adenopathy  Lungs clear -- no rales or rhonchi  Heart regular rate and rhythm  Abdomen benign  MSK no focal spinal tenderness, no peripheral edema  Neuro nonfocal  Breast exam: She is status post bilateral mastectomies. There is no evidence of recurrence.  Lab Results:      Chemistry      Component Value Date/Time   NA 141 03/18/2011 1402   NA 141 03/18/2011 1402   NA 141 03/18/2011 1402   K 4.3 03/18/2011 1402   K 4.3 03/18/2011 1402   K 4.3 03/18/2011 1402   CL 105 03/18/2011 1402   CL 105 03/18/2011 1402  CL 105 03/18/2011 1402   CO2 27 03/18/2011 1402   CO2 27 03/18/2011 1402   CO2 27 03/18/2011 1402   BUN 11 03/18/2011 1402   BUN 11 03/18/2011 1402   BUN 11 03/18/2011 1402   CREATININE 0.65 03/18/2011 1402   CREATININE 0.65 03/18/2011 1402   CREATININE 0.65 03/18/2011 1402      Component Value Date/Time   CALCIUM 9.5 03/18/2011 1402   CALCIUM 9.5 03/18/2011 1402   CALCIUM 9.5 03/18/2011 1402   ALKPHOS 72 03/18/2011 1402   ALKPHOS 72 03/18/2011 1402   ALKPHOS 72 03/18/2011 1402   AST 20 03/18/2011 1402   AST 20 03/18/2011 1402   AST 20 03/18/2011 1402   ALT 19 03/18/2011 1402   ALT 19 03/18/2011 1402   ALT 19 03/18/2011 1402   BILITOT 0.3 03/18/2011 1402   BILITOT 0.3 03/18/2011 1402   BILITOT 0.3 03/18/2011 1402       Lab Results  Component Value Date   WBC 5.9 03/18/2011   HGB 12.4 03/18/2011     HCT 36.6 03/18/2011   MCV 86.0 03/18/2011   PLT 241 03/18/2011   NEUTROABS 3.9 03/18/2011   Vitamin D level is 20. CA 27-29 is in the normal limits. Studies/Results:  Bone density shows mild to moderate osteopenia, with the lowest T score at -1.5  Assessment: A 46 year old [place] woman status post right lumpectomy and sentinel lymph node biopsy in May 2007 for a T2 N0, grade 2 invasive ductal carcinoma.  Tumor was strongly ER/PR positive, HER2/neu negative by FISH with MIB-1 of 30%.  Status post adjuvant chemotherapy with 4 cycles of dose-dense doxorubicin/ cyclophosphamide, followed by weekly paclitaxel x4.  Status post bilateral mastectomies in November 2007 with implant reconstruction and no evidence of residual disease.  On tamoxifen from January 2008 until June 2011. Took letrozole briefly in 2012, discontinuing it at her own initiative..  Also with a history of vitamin D deficiency and hypertension.    Plan: We talked about bone density issues I., and I strongly encouraged her to take her calcium and vitamin D as prescribed. I also encouraged her to start a walking program. She does these faithfully she may have an improved bone density when it is rechecked in 2 years. In any case she took the letrozole to briefly to significantly affect her bone density.  At this point, more than 5 years out from her original surgery, and comfortable releasing her to her primary care physician. She knows we will be glad to see her at any point if the need arises. 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 I gave her some contacts for her to follow through on that.  MAGRINAT,GUSTAV C 04/13/2011

## 2011-04-28 ENCOUNTER — Other Ambulatory Visit: Payer: Self-pay | Admitting: Internal Medicine

## 2011-04-28 NOTE — Telephone Encounter (Signed)
I believe she may have missed a follow up appointment. There was some issue with noncompliance with all BP meds as well. Let's check chart and see when she is due again. Thanks, MJB

## 2011-05-04 ENCOUNTER — Ambulatory Visit (INDEPENDENT_AMBULATORY_CARE_PROVIDER_SITE_OTHER): Payer: Medicare Other | Admitting: Internal Medicine

## 2011-05-04 ENCOUNTER — Encounter: Payer: Self-pay | Admitting: Internal Medicine

## 2011-05-04 VITALS — BP 140/88 | HR 76 | Temp 97.5°F | Wt 169.0 lb

## 2011-05-04 DIAGNOSIS — R6889 Other general symptoms and signs: Secondary | ICD-10-CM

## 2011-05-04 DIAGNOSIS — R002 Palpitations: Secondary | ICD-10-CM

## 2011-05-04 DIAGNOSIS — Z91013 Allergy to seafood: Secondary | ICD-10-CM

## 2011-05-04 DIAGNOSIS — B351 Tinea unguium: Secondary | ICD-10-CM

## 2011-05-04 DIAGNOSIS — Z853 Personal history of malignant neoplasm of breast: Secondary | ICD-10-CM

## 2011-05-04 DIAGNOSIS — R5383 Other fatigue: Secondary | ICD-10-CM

## 2011-05-04 DIAGNOSIS — I10 Essential (primary) hypertension: Secondary | ICD-10-CM

## 2011-05-04 DIAGNOSIS — R7989 Other specified abnormal findings of blood chemistry: Secondary | ICD-10-CM

## 2011-05-04 DIAGNOSIS — R5381 Other malaise: Secondary | ICD-10-CM

## 2011-05-04 LAB — T4, FREE: Free T4: 0.98 ng/dL (ref 0.80–1.80)

## 2011-05-04 LAB — TSH: TSH: 0.341 u[IU]/mL — ABNORMAL LOW (ref 0.350–4.500)

## 2011-05-19 DIAGNOSIS — R5383 Other fatigue: Secondary | ICD-10-CM | POA: Insufficient documentation

## 2011-05-19 DIAGNOSIS — B351 Tinea unguium: Secondary | ICD-10-CM | POA: Insufficient documentation

## 2011-05-19 DIAGNOSIS — R7989 Other specified abnormal findings of blood chemistry: Secondary | ICD-10-CM | POA: Insufficient documentation

## 2011-05-19 DIAGNOSIS — R002 Palpitations: Secondary | ICD-10-CM | POA: Insufficient documentation

## 2011-05-19 NOTE — Progress Notes (Signed)
  Subjective:    Patient ID: Sonya Franklin, female    DOB: [birth year], 46 y.o.   MRN: [id number]  HPI 46 year old black female who is in the process of divorcing her husband with history of breast cancer. She is fatigued. Has had some concerned about discoloration of great toe nail. Has been having some palpitations.    Review of Systems     Objective:   Physical Exam mildly tachycardic. No murmur. Chest clear to auscultation. Discoloration of left great toe nail. No thyromegaly.        Assessment & Plan:  History of breast cancer  Hypertension  History of fatigue  Possible hyperthyroidism  Onychomycosis left great toenail  Anxiety  Plan: Check thyroid functions today. Patient does not want treatment for onychomycosis at this point in time. Continue same medication for hypertension. Will need physical exam in 6 months. May need endocrinology evaluation.            Addendum: Patient seen by Dr. Lucianne Muss at [facility] at my request regarding abnormal TSH on 05/19/2011. Free T4 and and free T3 levels have been normal. TSH nail 0.34 which is the highest she has had compared to previous levels. She does appear to have small thyroid enlargement or right side according to Dr. Lucianne Muss which may be a goiter. She had a thyroid scan previously showing no hot or cold nodules. He feels no treatment is necessary and she should return in one year. Does not feel fatigue is being caused by thyroid issue.  Addendum 06/13/2011: Dr. Lucianne Muss same patient does not feel she needs treatment for hyperthyroidism.

## 2011-06-13 DIAGNOSIS — Z853 Personal history of malignant neoplasm of breast: Secondary | ICD-10-CM | POA: Insufficient documentation

## 2011-06-13 DIAGNOSIS — Z91013 Allergy to seafood: Secondary | ICD-10-CM | POA: Insufficient documentation

## 2011-06-14 NOTE — Patient Instructions (Signed)
Continue same medications. We will let you know results of thyroid functions. Return in 6 months for physical exam.

## 2011-06-25 ENCOUNTER — Other Ambulatory Visit: Payer: Self-pay

## 2011-06-25 MED ORDER — METOPROLOL SUCCINATE ER 50 MG PO TB24: 50.0000 mg | ORAL_TABLET | Freq: Two times a day (BID) | ORAL | Status: DC

## 2011-07-27 ENCOUNTER — Other Ambulatory Visit: Payer: Self-pay | Admitting: Internal Medicine

## 2011-08-24 ENCOUNTER — Encounter: Payer: Self-pay | Admitting: Internal Medicine

## 2011-08-24 ENCOUNTER — Ambulatory Visit (INDEPENDENT_AMBULATORY_CARE_PROVIDER_SITE_OTHER): Payer: Medicare Other | Admitting: Internal Medicine

## 2011-08-24 VITALS — BP 138/84 | Temp 98.4°F

## 2011-08-24 DIAGNOSIS — J329 Chronic sinusitis, unspecified: Secondary | ICD-10-CM

## 2011-08-24 DIAGNOSIS — H6121 Impacted cerumen, right ear: Secondary | ICD-10-CM

## 2011-08-24 DIAGNOSIS — I10 Essential (primary) hypertension: Secondary | ICD-10-CM

## 2011-08-24 DIAGNOSIS — H6590 Unspecified nonsuppurative otitis media, unspecified ear: Secondary | ICD-10-CM

## 2011-08-24 DIAGNOSIS — J309 Allergic rhinitis, unspecified: Secondary | ICD-10-CM

## 2011-08-24 DIAGNOSIS — H6593 Unspecified nonsuppurative otitis media, bilateral: Secondary | ICD-10-CM

## 2011-08-24 DIAGNOSIS — H6120 Impacted cerumen, unspecified ear: Secondary | ICD-10-CM

## 2011-08-24 NOTE — Progress Notes (Signed)
  Subjective:    Patient ID: Sonya Franklin, female    DOB: [birth year], 46 y.o.   MRN: [id number]  HPI 46 year old black female with history of fatigue with history in the remote past of breast cancer. History of hypertension. Still going through a bitter divorce. Under a lot of stress. Feeling dizzy. Says ears feel stopped up. Patient is worried it might be hypertension. History of hypertension controlled on medication. Not having frank vertigo just feels lightheaded. No headache. Somewhat anxious. Says she has trouble focusing and getting things done around the house.    Review of Systems     Objective:   Physical Exam HEENT exam: Boggy nasal mucosa bilaterally. Impacted cerumen right ear removed with curette. Both TMs are full bilaterally but not red. Pharynx is clear. Neck is supple without adenopathy. Chest clear.        Assessment & Plan:  Allergic rhinitis  Bilateral serous otitis media  Sinusitis  Hypertension  Anxiety  History of fatigue  History of breast cancer  Plan: Zithromax Z-Pak take 2 tablets day one followed by 1 tablet days 2 through 5. Depo-Medrol 80 mg IM. If symptoms persist, consider allergy testing. Reassure patient. Blood pressure is acceptable.

## 2011-08-24 NOTE — Patient Instructions (Addendum)
You have been given an injection of Depo-Medrol today for allergy symptoms. Take Zithromax Z-PAK as directed for sinusitis symptoms. Call if symptoms persist and we will pursue allergy testing.

## 2011-08-25 NOTE — Progress Notes (Signed)
Addended by: Judy Pimple on: 08/25/2011 05:26 PM   Modules accepted: Orders

## 2011-08-27 MED ORDER — METHYLPREDNISOLONE ACETATE 80 MG/ML IJ SUSP
80.0000 mg | Freq: Once | INTRAMUSCULAR | Status: AC
  Administered 2011-08-27: 80 mg via INTRAMUSCULAR

## 2011-08-27 NOTE — Addendum Note (Signed)
Addended by: Judy Pimple on: 08/27/2011 12:38 PM   Modules accepted: Orders

## 2011-09-21 ENCOUNTER — Other Ambulatory Visit: Payer: Self-pay | Admitting: Oncology

## 2011-11-24 ENCOUNTER — Other Ambulatory Visit: Payer: Self-pay | Admitting: Internal Medicine

## 2011-12-21 ENCOUNTER — Emergency Department (HOSPITAL_COMMUNITY): Payer: Medicare Other

## 2011-12-21 ENCOUNTER — Emergency Department (HOSPITAL_COMMUNITY)
Admission: EM | Admit: 2011-12-21 | Discharge: 2011-12-21 | Discharge status: Against Medical Advice | Payer: Medicare Other | Attending: Emergency Medicine | Admitting: Emergency Medicine

## 2011-12-21 ENCOUNTER — Telehealth: Payer: Self-pay | Admitting: Internal Medicine

## 2011-12-21 ENCOUNTER — Encounter (HOSPITAL_COMMUNITY): Payer: Self-pay

## 2011-12-21 ENCOUNTER — Emergency Department (HOSPITAL_COMMUNITY)
Admission: EM | Admit: 2011-12-21 | Discharge: 2011-12-21 | Disposition: A | Discharge status: Home / Self Care | Payer: Medicare Other | Attending: Emergency Medicine | Admitting: Emergency Medicine

## 2011-12-21 ENCOUNTER — Emergency Department (HOSPITAL_COMMUNITY)
Admission: EM | Admit: 2011-12-21 | Discharge: 2011-12-21 | Disposition: A | Discharge status: Home / Self Care | Payer: Medicare Other | Source: Home / Self Care | Attending: Emergency Medicine | Admitting: Emergency Medicine

## 2011-12-21 ENCOUNTER — Encounter (HOSPITAL_COMMUNITY): Payer: Self-pay | Admitting: *Deleted

## 2011-12-21 DIAGNOSIS — R10811 Right upper quadrant abdominal tenderness: Secondary | ICD-10-CM | POA: Insufficient documentation

## 2011-12-21 DIAGNOSIS — R110 Nausea: Secondary | ICD-10-CM

## 2011-12-21 DIAGNOSIS — R112 Nausea with vomiting, unspecified: Secondary | ICD-10-CM | POA: Insufficient documentation

## 2011-12-21 DIAGNOSIS — R109 Unspecified abdominal pain: Secondary | ICD-10-CM

## 2011-12-21 DIAGNOSIS — D72829 Elevated white blood cell count, unspecified: Secondary | ICD-10-CM

## 2011-12-21 DIAGNOSIS — R1110 Vomiting, unspecified: Secondary | ICD-10-CM

## 2011-12-21 DIAGNOSIS — I10 Essential (primary) hypertension: Secondary | ICD-10-CM | POA: Insufficient documentation

## 2011-12-21 DIAGNOSIS — R1031 Right lower quadrant pain: Secondary | ICD-10-CM | POA: Insufficient documentation

## 2011-12-21 HISTORY — DX: Malignant neoplasm of unspecified site of unspecified female breast: C50.919

## 2011-12-21 LAB — URINALYSIS, ROUTINE W REFLEX MICROSCOPIC
Bilirubin Urine: NEGATIVE
Glucose, UA: NEGATIVE mg/dL
Nitrite: NEGATIVE
Specific Gravity, Urine: 1.015 (ref 1.005–1.030)
pH: 6 (ref 5.0–8.0)

## 2011-12-21 LAB — COMPREHENSIVE METABOLIC PANEL
ALT: 18 U/L (ref 0–35)
AST: 22 U/L (ref 0–37)
Albumin: 4.1 g/dL (ref 3.5–5.2)
Calcium: 10 mg/dL (ref 8.4–10.5)
GFR calc Af Amer: >90 mL/min (ref >90)
Glucose, Bld: 110 mg/dL — ABNORMAL HIGH (ref 70–99)
Potassium: 3.8 mEq/L (ref 3.5–5.1)
Sodium: 139 mEq/L (ref 135–145)
Total Protein: 8.1 g/dL (ref 6.0–8.3)

## 2011-12-21 LAB — CBC WITH DIFFERENTIAL/PLATELET
Basophils Absolute: 0 10*3/uL (ref 0.0–0.1)
Basophils Relative: 0 % (ref 0–1)
Eosinophils Absolute: 0 10*3/uL (ref 0.0–0.7)
Eosinophils Relative: 0 % (ref 0–5)
MCH: 28.6 pg (ref 26.0–34.0)
MCHC: 33.2 g/dL (ref 30.0–36.0)
MCV: 86.3 fL (ref 78.0–100.0)
Neutrophils Relative %: 85 % — ABNORMAL HIGH (ref 43–77)
Platelets: 225 10*3/uL (ref 150–400)
RDW: 13 % (ref 11.5–15.5)

## 2011-12-21 LAB — POCT URINALYSIS DIP (DEVICE)
Bilirubin Urine: NEGATIVE
Ketones, ur: NEGATIVE mg/dL
Leukocytes, UA: NEGATIVE
Specific Gravity, Urine: 1.015 (ref 1.005–1.030)
pH: 5.5 (ref 5.0–8.0)

## 2011-12-21 LAB — URINE MICROSCOPIC-ADD ON

## 2011-12-21 MED ORDER — IOHEXOL 300 MG/ML  SOLN
100.0000 mL | Freq: Once | INTRAMUSCULAR | Status: AC | PRN
  Administered 2011-12-21: 100 mL via INTRAVENOUS

## 2011-12-21 MED ORDER — METRONIDAZOLE 500 MG PO TABS
500.0000 mg | ORAL_TABLET | Freq: Once | ORAL | Status: AC
  Administered 2011-12-21: 500 mg via ORAL
  Filled 2011-12-21: qty 1

## 2011-12-21 MED ORDER — METRONIDAZOLE 500 MG PO TABS: 500.0000 mg | ORAL_TABLET | Freq: Three times a day (TID) | ORAL | Status: DC

## 2011-12-21 MED ORDER — IOHEXOL 300 MG/ML  SOLN
20.0000 mL | INTRAMUSCULAR | Status: AC
Start: 2011-12-21 — End: 2011-12-21
  Administered 2011-12-21: 20 mL via ORAL

## 2011-12-21 MED ORDER — ONDANSETRON HCL 4 MG/2ML IJ SOLN
INTRAMUSCULAR | Status: AC
  Filled 2011-12-21: qty 2

## 2011-12-21 MED ORDER — GI COCKTAIL ~~LOC~~
30.0000 mL | Freq: Once | ORAL | Status: AC
  Administered 2011-12-21: 30 mL via ORAL

## 2011-12-21 MED ORDER — ONDANSETRON HCL 4 MG PO TABS: 4.0000 mg | ORAL_TABLET | Freq: Three times a day (TID) | ORAL | Status: DC | PRN

## 2011-12-21 MED ORDER — ONDANSETRON 4 MG PO TBDP: 4.0000 mg | ORAL_TABLET | Freq: Once | ORAL | Status: DC

## 2011-12-21 MED ORDER — CIPROFLOXACIN HCL 500 MG PO TABS: 500.0000 mg | ORAL_TABLET | Freq: Two times a day (BID) | ORAL | Status: DC

## 2011-12-21 MED ORDER — OXYCODONE-ACETAMINOPHEN 5-325 MG PO TABS: ORAL_TABLET | ORAL | Status: DC

## 2011-12-21 MED ORDER — ONDANSETRON HCL 4 MG/2ML IJ SOLN
4.0000 mg | Freq: Once | INTRAMUSCULAR | Status: AC
  Administered 2011-12-21: 4 mg via INTRAVENOUS

## 2011-12-21 MED ORDER — CIPROFLOXACIN HCL 500 MG PO TABS
500.0000 mg | ORAL_TABLET | Freq: Once | ORAL | Status: AC
  Administered 2011-12-21: 500 mg via ORAL
  Filled 2011-12-21: qty 1

## 2011-12-21 MED ORDER — HYDROMORPHONE HCL PF 1 MG/ML IJ SOLN
1.0000 mg | Freq: Once | INTRAMUSCULAR | Status: AC
  Administered 2011-12-21: 1 mg via INTRAVENOUS
  Filled 2011-12-21: qty 1

## 2011-12-21 NOTE — Telephone Encounter (Signed)
Patient called at 7 AM saying that she had been sick since 3 or 4 AM. Has had vomiting but no diarrhea. Says it feels like there is a "volcano" in her upper abdomen. Ate barbecued chicken last night for supper. Told patient office was open and 9 AM. She does not feel that she can wait until 9 AM and will proceed to emergency department. Says pain is throughout her abdomen and not concentrated on one side or another

## 2011-12-21 NOTE — ED Provider Notes (Addendum)
History     CSN: [id number]  Arrival date & time 12/21/11  1204   First MD Initiated Contact with Patient 12/21/11 1318      Chief Complaint  Patient presents with  . UCC TX- abdominal pain     (Consider location/radiation/quality/duration/timing/severity/associated sxs/prior treatment) HPI Comments: Patient presents with abdominal pain of her RLQ that started last night. Patient states that when it starts it is a 5/10 and increases to a 10/10 without radiation or transmission. Last bowel movement.  Denies fever or chills. Reports nausea and vomiting X 2 but denies diarrhea. Patient had hysterectomy in 06 but no other abdominal surgeries. Denies dysuria, urgency, frequency or vaginal discharge.  The history is provided by the patient. No language interpreter was used.    Past Medical History  Diagnosis Date  . Cancer     breast  . Hypertension   . Ankylosing spondylitis   . Bursitis of hip, right   . GE reflux   . Vitamin D deficiency   . Anxiety   . Hyperthyroidism   . Breast cancer     Past Surgical History  Procedure Date  . Breast lumpectomy     right  . Abdominal hysterectomy     No family history on file.  History  Substance Use Topics  . Smoking status: Never Smoker   . Smokeless tobacco: Never Used  . Alcohol Use: No    OB History    Grav Para Term Preterm Abortions TAB SAB Ect Mult Living                  Review of Systems  Constitutional: Negative for fever and chills.  Cardiovascular: Negative for leg swelling.  Gastrointestinal: Positive for nausea, vomiting, abdominal pain and diarrhea.  Genitourinary: Negative for difficulty urinating.  Neurological: Negative for weakness and numbness.    Allergies  Shellfish allergy and Blue dyes (parenteral)  Home Medications   Current Outpatient Rx  Name Route Sig Dispense Refill  . IBUPROFEN 800 MG PO TABS  TAKE 1 TABLET BY MOUTH 3 TIMES A DAY 90 tablet 5  . LISINOPRIL 20 MG PO TABS  TAKE 1  TABLET BY MOUTH DAILY 30 tablet 1  . METOPROLOL SUCCINATE ER 50 MG PO TB24 Oral Take 1 tablet (50 mg total) by mouth 2 (two) times daily. 60 tablet 5  . VITAMIN D (ERGOCALCIFEROL) 50000 UNITS PO CAPS  TAKE ONE CAPSULE BY MOUTH ONCE A WEEK 6 capsule 3    BP 157/97  Pulse 88  Temp 98.4 F (36.9 C)  Resp 18  SpO2 99%  Physical Exam  Nursing note and vitals reviewed. Constitutional: She appears well-developed and well-nourished.  HENT:  Head: Normocephalic and atraumatic.  Mouth/Throat: Oropharynx is clear and moist.  Eyes: Pupils are equal, round, and reactive to light. No scleral icterus.  Cardiovascular: Normal rate, regular rhythm and normal heart sounds.   Pulmonary/Chest: Effort normal and breath sounds normal.  Abdominal: Soft. Bowel sounds are normal. She exhibits no distension and no mass. There is tenderness. There is no rebound and no guarding.       Patient diffusely tender to palpation of the abdomen.  Neurological: She is alert.  Skin: Skin is warm and dry.    ED Course  Procedures (including critical care time)  Labs Reviewed  URINALYSIS, ROUTINE W REFLEX MICROSCOPIC - Abnormal; Notable for the following:    Ketones, ur 15 (*)     Leukocytes, UA TRACE (*)  All other components within normal limits  URINE MICROSCOPIC-ADD ON - Abnormal; Notable for the following:    Squamous Epithelial / LPF FEW (*)     All other components within normal limits   No results found.   1. Abdominal pain   2. Leukocytosis   3. Nausea   4. Vomiting       MDM  Patient presented from [facility] with abdominal pain and nausea. Notes from that visit stated that patient needs CT. Labs from that visit. CBC, CMP & ZO:XWRU remarkable for mild leukocytosis. Patient given dilaudid and zofran. CT abdomen pelvis: pending. Patient care signed out to [facility], PA-C.        Pixie Casino, PA-C 12/21/11 1526  Pixie Casino, PA-C 01/25/12 1737

## 2011-12-21 NOTE — ED Provider Notes (Signed)
History     CSN: [id number]  Arrival date & time 12/21/11  1478   First MD Initiated Contact with Patient 12/21/11 [phone]      Chief Complaint  Patient presents with  . Abdominal Pain    (Consider location/radiation/quality/duration/timing/severity/associated sxs/prior treatment) Patient is a 46 y.o. female presenting with abdominal pain. The history is provided by the patient. No language interpreter was used.  Abdominal Pain The primary symptoms of the illness include abdominal pain, nausea and diarrhea. Episode onset: 2weeks  The onset of the illness was gradual. The problem has been gradually worsening.  The patient states that she believes she is currently not pregnant. The patient has not had a change in bowel habit. Additional symptoms associated with the illness include chills. Significant associated medical issues include GERD. Significant associated medical issues do not include gallstones.   Pt complains of abdominal pain for 2 weeks that has increased since 4am.   Pt complains of pain right lower abdomen.  Pt reports she has had reflux in the past.  Pt is concerned about her gallbladder.   Past Medical History  Diagnosis Date  . Cancer     breast  . Hypertension   . Ankylosing spondylitis   . Bursitis of hip, right   . GE reflux   . Vitamin D deficiency   . Anxiety   . Hyperthyroidism   . Breast cancer     Past Surgical History  Procedure Date  . Breast lumpectomy     right  . Abdominal hysterectomy     No family history on file.  History  Substance Use Topics  . Smoking status: Never Smoker   . Smokeless tobacco: Never Used  . Alcohol Use: No    OB History    Grav Para Term Preterm Abortions TAB SAB Ect Mult Living                  Review of Systems  Constitutional: Positive for chills.  Gastrointestinal: Positive for nausea, abdominal pain and diarrhea.    Allergies  Shellfish allergy and Blue dyes (parenteral)  Home Medications   Current  Outpatient Rx  Name Route Sig Dispense Refill  . IBUPROFEN 800 MG PO TABS  TAKE 1 TABLET BY MOUTH 3 TIMES A DAY 90 tablet 5  . LISINOPRIL 20 MG PO TABS  TAKE 1 TABLET BY MOUTH DAILY 30 tablet 1  . METOPROLOL SUCCINATE ER 50 MG PO TB24 Oral Take 1 tablet (50 mg total) by mouth 2 (two) times daily. 60 tablet 5  . VITAMIN D (ERGOCALCIFEROL) 50000 UNITS PO CAPS  TAKE ONE CAPSULE BY MOUTH ONCE A WEEK 6 capsule 3    BP 159/96  Pulse 77  Temp 98.5 F (36.9 C) (Oral)  Resp 18  SpO2 100%  Physical Exam  Vitals reviewed. Constitutional: She is oriented to person, place, and time. She appears well-developed and well-nourished.  HENT:  Head: Normocephalic and atraumatic.  Right Ear: External ear normal.  Left Ear: External ear normal.  Nose: Nose normal.  Mouth/Throat: Oropharynx is clear and moist.  Eyes: Conjunctivae normal and EOM are normal. Pupils are equal, round, and reactive to light.  Neck: Normal range of motion. Neck supple.  Cardiovascular: Normal rate and normal heart sounds.   Pulmonary/Chest: Effort normal and breath sounds normal.  Abdominal: Soft. Bowel sounds are normal. There is tenderness.  Musculoskeletal: Normal range of motion.  Neurological: She is alert and oriented to person, place, and time. She has  normal reflexes.  Skin: Skin is warm.  Psychiatric: She has a normal mood and affect.    ED Course  Procedures (including critical care time)  Labs Reviewed  CBC WITH DIFFERENTIAL - Abnormal; Notable for the following:    WBC 10.7 (*)     Neutrophils Relative 85 (*)     Neutro Abs 9.1 (*)     Lymphocytes Relative 11 (*)     All other components within normal limits  COMPREHENSIVE METABOLIC PANEL - Abnormal; Notable for the following:    Glucose, Bld 110 (*)     All other components within normal limits  POCT URINALYSIS DIP (DEVICE) - Abnormal; Notable for the following:    Protein, ur 30 (*)     All other components within normal limits  LIPASE, BLOOD    No results found.   1. Abdominal pain       MDM  Pt reports increasing pain since being at urgent care.  Pt has wbc's of 10.7.   I counseled pt.  I will send her to ED for ct scan.        Lonia Skinner [place], [place] 12/21/11 1148

## 2011-12-21 NOTE — ED Notes (Signed)
Patient transported to CT 

## 2011-12-21 NOTE — ED Provider Notes (Signed)
Medical screening examination/treatment/procedure(s) were conducted as a shared visit with non-physician practitioner(s) and myself.  I personally evaluated the patient during the encounter  Chi Woodham, MD 12/21/11 1554 

## 2011-12-21 NOTE — ED Notes (Signed)
PT is here with right sided abdominal pain that started last nite at 0200.  Pt reports vomiting.

## 2011-12-21 NOTE — ED Notes (Signed)
Pt c/o right sided abd pain that started this morning. Pt reports some n/v. Pt denies tenderness to touch. Pt reports LBM this morning, reports it was darker than normal.

## 2011-12-21 NOTE — ED Provider Notes (Signed)
Sign out shift change from [facility], plan is to discharge pending nonsurgical results from CT abdomen pelvis. Patient is comfortable, tolerating by mouth, abdominal pain is minimal. Physical exam shows mild tenderness to palpation of right upper quadrant with no peritoneal signs.  CT abdomen pelvis shows diffuse inflammatory process: Read as IBD versus infectious process. Patient informed of results and comfort level verified she is still tolerating by mouth, pain is minimal, abdominal exam shows no peritoneal signs and she has access to outpatient care with her primary doctor [place].   New Prescriptions   CIPROFLOXACIN (CIPRO) 500 MG TABLET    Take 1 tablet (500 mg total) by mouth every 12 (twelve) hours.   METRONIDAZOLE (FLAGYL) 500 MG TABLET    Take 1 tablet (500 mg total) by mouth 3 (three) times daily. Do not drink alcohol while taking this medication or for several days after you finish the course   ONDANSETRON (ZOFRAN) 4 MG TABLET    Take 1 tablet (4 mg total) by mouth every 8 (eight) hours as needed for nausea.   OXYCODONE-ACETAMINOPHEN (PERCOCET/ROXICET) 5-325 MG PER TABLET    1 to 2 tabs PO q6hrs  PRN for pain    Wynetta Emery, PA-C 12/21/11 2110

## 2011-12-21 NOTE — ED Provider Notes (Signed)
Medical screening examination/treatment/procedure(s) were conducted as a shared visit with non-physician practitioner(s) and myself.  I personally evaluated the patient during the encounter 53, old female, presents emergency department after she was seen in the urgent care center for evaluation of abdominal pain.  She presents with right lower quadrant abdominal pain.  She denies urinary tract symptoms.  She denies vaginal bleeding.  On physical, examination.  She's got right lower quadrant tenderness, with positive psoas sign.  She does not have rebound.  She only has a mild elevation of her white blood cells.  We will perform a CAT scan to evaluate for possible appendicitis.  Cheri Guppy, MD 12/21/11 [phone]

## 2011-12-21 NOTE — ED Notes (Signed)
IV removed from left AC,gauze was applied with tape,and bleeding controlled.

## 2011-12-21 NOTE — ED Notes (Signed)
C/o abdominal pain, started approx 2 weeks ago but woke up at 4 am with nausea, vomiting and has had diarrhea. Located on right side

## 2011-12-21 NOTE — ED Notes (Signed)
Patient reports that she began having right lower abdominal pain, N/V/D, abdominal distention, and a headache since last night. Patient called her PCP and was told to come to the ED.

## 2011-12-22 ENCOUNTER — Ambulatory Visit: Payer: Medicare Other | Admitting: Oncology

## 2011-12-23 ENCOUNTER — Other Ambulatory Visit: Payer: Self-pay

## 2011-12-23 ENCOUNTER — Encounter: Payer: Self-pay | Admitting: Gastroenterology

## 2011-12-23 DIAGNOSIS — K529 Noninfective gastroenteritis and colitis, unspecified: Secondary | ICD-10-CM

## 2011-12-23 MED ORDER — HYDROCODONE-ACETAMINOPHEN 5-500 MG PO TABS: 1.0000 | ORAL_TABLET | Freq: Three times a day (TID) | ORAL | Status: DC | PRN

## 2011-12-23 NOTE — Telephone Encounter (Signed)
Patient was seen in ED recently. Diagnosed with colitis and told to see a gastroenterologist. Referral made to Dr. Russella Dar at [facility] for 01/07/2012 at 11:00 am. Patient advised

## 2011-12-23 NOTE — ED Provider Notes (Signed)
Medical screening examination/treatment/procedure(s) were performed by non-physician practitioner and as supervising physician I was immediately available for consultation/collaboration.  Leslee Home, M.D.   Reuben Likes, MD 12/23/11 [phone]

## 2012-01-07 ENCOUNTER — Encounter: Payer: Self-pay | Admitting: Gastroenterology

## 2012-01-07 ENCOUNTER — Ambulatory Visit (INDEPENDENT_AMBULATORY_CARE_PROVIDER_SITE_OTHER): Payer: Medicare Other | Admitting: Gastroenterology

## 2012-01-07 VITALS — BP 132/90 | HR 68 | Ht 61.5 in | Wt 166.5 lb

## 2012-01-07 DIAGNOSIS — R933 Abnormal findings on diagnostic imaging of other parts of digestive tract: Secondary | ICD-10-CM

## 2012-01-07 DIAGNOSIS — R109 Unspecified abdominal pain: Secondary | ICD-10-CM

## 2012-01-07 MED ORDER — PEG-KCL-NACL-NASULF-NA ASC-C 100 G PO SOLR: 1.0000 | Freq: Once | ORAL | Status: DC

## 2012-01-07 NOTE — Patient Instructions (Addendum)
You have been scheduled for a colonoscopy with propofol. Please follow written instructions given to you at your visit today.  Please pick up your prep kit at the pharmacy within the next 1-3 days. If you use inhalers (even only as needed), please bring them with you on the day of your procedure.  

## 2012-01-07 NOTE — Progress Notes (Signed)
History of Present Illness: This is a 46 year old female who is recently evaluated in the emergency department for the sudden onset of lower abdominal pain. I reviewed the emergency department records. An abdominal pelvic CT scan revealed inflammation of the distal ileum and terminal ileum as well as focal colonic wall thickening in the splenic flexure and sigmoid colon. She was treated for presumed infectious process with a course of Cipro and Flagyl and her symptoms have resolved. She notes occasional difficulties with mild postprandial abdominal discomfort following meals which is new since her acute illness. Previously evaluated her for iron deficiency anemia in 1999. Colonoscopy in February 1999 was unremarkable. Upper endoscopy in February 1999 showed a small sliding hiatal hernia and grade 1 distal esophagitis. Abdominal ultrasound in 1999 was unremarkable. Denies weight loss, constipation, diarrhea, change in stool caliber, melena, hematochezia, nausea, vomiting, dysphagia, reflux symptoms, chest pain.  Review of Systems: Pertinent positive and negative review of systems were noted in the above HPI section. All other review of systems were otherwise negative.  Current Medications, Allergies, Past Medical History, Past Surgical History, Family History and Social History were reviewed in [employer] record.  Physical Exam: General: Well developed , well nourished, no acute distress Head: Normocephalic and atraumatic Eyes:  sclerae anicteric, EOMI Ears: Normal auditory acuity Mouth: No deformity or lesions Neck: Supple, no masses or thyromegaly Lungs: Clear throughout to auscultation Heart: Regular rate and rhythm; no murmurs, rubs or bruits Abdomen: Soft, non tender and non distended. No masses, hepatosplenomegaly or hernias noted. Normal Bowel sounds Rectal: Deferred to colonoscopy Musculoskeletal: Symmetrical with no gross deformities  Skin: No lesions on visible  extremities Pulses:  Normal pulses noted Extremities: No clubbing, cyanosis, edema or deformities noted Neurological: Alert oriented x 4, grossly nonfocal Cervical Nodes:  No significant cervical adenopathy Inguinal Nodes: No significant inguinal adenopathy Psychological:  Alert and cooperative. Normal mood and affect  Assessment and Recommendations:  1. Acute abdominal pain associated with inflammatory changes in the distal ileum, terminal ileum, splenic flexure and sigmoid colon. Her symptoms have resolved. This is presumably an infectious process. Ischemic etiologies and Crohn's disease are felt to be less likely. Further evaluation with colonoscopy. The risks, benefits, and alternatives to colonoscopy with possible biopsy and possible polypectomy were discussed with the patient and they consent to proceed.

## 2012-01-11 ENCOUNTER — Encounter: Payer: Self-pay | Admitting: Gastroenterology

## 2012-01-26 NOTE — ED Provider Notes (Signed)
Medical screening examination/treatment/procedure(s) were performed by non-physician practitioner and as supervising physician I was immediately available for consultation/collaboration.  Cheri Guppy, MD 01/26/12 [phone]

## 2012-02-08 ENCOUNTER — Encounter (HOSPITAL_COMMUNITY): Payer: Self-pay | Admitting: *Deleted

## 2012-02-08 ENCOUNTER — Inpatient Hospital Stay (HOSPITAL_COMMUNITY): Payer: Medicare Other

## 2012-02-08 ENCOUNTER — Inpatient Hospital Stay (HOSPITAL_COMMUNITY)
Admission: EM | Admit: 2012-02-08 | Discharge: 2012-02-10 | DRG: 392 | Disposition: A | Discharge status: Home / Self Care | Payer: Medicare Other | Attending: Internal Medicine | Admitting: Internal Medicine

## 2012-02-08 ENCOUNTER — Telehealth: Payer: Self-pay | Admitting: Gastroenterology

## 2012-02-08 ENCOUNTER — Telehealth: Payer: Self-pay | Admitting: Internal Medicine

## 2012-02-08 DIAGNOSIS — E876 Hypokalemia: Secondary | ICD-10-CM | POA: Diagnosis present

## 2012-02-08 DIAGNOSIS — Z9221 Personal history of antineoplastic chemotherapy: Secondary | ICD-10-CM

## 2012-02-08 DIAGNOSIS — E872 Acidosis, unspecified: Secondary | ICD-10-CM

## 2012-02-08 DIAGNOSIS — I10 Essential (primary) hypertension: Secondary | ICD-10-CM | POA: Diagnosis present

## 2012-02-08 DIAGNOSIS — E559 Vitamin D deficiency, unspecified: Secondary | ICD-10-CM | POA: Diagnosis present

## 2012-02-08 DIAGNOSIS — E0590 Thyrotoxicosis, unspecified without thyrotoxic crisis or storm: Secondary | ICD-10-CM | POA: Diagnosis present

## 2012-02-08 DIAGNOSIS — Z90710 Acquired absence of both cervix and uterus: Secondary | ICD-10-CM

## 2012-02-08 DIAGNOSIS — F411 Generalized anxiety disorder: Secondary | ICD-10-CM | POA: Diagnosis present

## 2012-02-08 DIAGNOSIS — K219 Gastro-esophageal reflux disease without esophagitis: Secondary | ICD-10-CM | POA: Diagnosis present

## 2012-02-08 DIAGNOSIS — R197 Diarrhea, unspecified: Secondary | ICD-10-CM

## 2012-02-08 DIAGNOSIS — R7989 Other specified abnormal findings of blood chemistry: Secondary | ICD-10-CM

## 2012-02-08 DIAGNOSIS — M129 Arthropathy, unspecified: Secondary | ICD-10-CM | POA: Diagnosis present

## 2012-02-08 DIAGNOSIS — Z853 Personal history of malignant neoplasm of breast: Secondary | ICD-10-CM

## 2012-02-08 DIAGNOSIS — E870 Hyperosmolality and hypernatremia: Secondary | ICD-10-CM

## 2012-02-08 DIAGNOSIS — M459 Ankylosing spondylitis of unspecified sites in spine: Secondary | ICD-10-CM | POA: Diagnosis present

## 2012-02-08 DIAGNOSIS — Z9010 Acquired absence of unspecified breast and nipple: Secondary | ICD-10-CM

## 2012-02-08 DIAGNOSIS — K559 Vascular disorder of intestine, unspecified: Secondary | ICD-10-CM | POA: Diagnosis present

## 2012-02-08 DIAGNOSIS — R112 Nausea with vomiting, unspecified: Secondary | ICD-10-CM

## 2012-02-08 DIAGNOSIS — K5289 Other specified noninfective gastroenteritis and colitis: Principal | ICD-10-CM | POA: Diagnosis present

## 2012-02-08 DIAGNOSIS — R51 Headache: Secondary | ICD-10-CM | POA: Diagnosis present

## 2012-02-08 LAB — CBC WITH DIFFERENTIAL/PLATELET
Basophils Absolute: 0 10*3/uL (ref 0.0–0.1)
HCT: 42.1 % (ref 36.0–46.0)
Hemoglobin: 14.1 g/dL (ref 12.0–15.0)
Lymphocytes Relative: 2 % — ABNORMAL LOW (ref 12–46)
Lymphs Abs: 0.3 10*3/uL — ABNORMAL LOW (ref 0.7–4.0)
Monocytes Absolute: 0.6 10*3/uL (ref 0.1–1.0)
Neutro Abs: 12.3 10*3/uL — ABNORMAL HIGH (ref 1.7–7.7)
RBC: 4.9 MIL/uL (ref 3.87–5.11)
RDW: 12.8 % (ref 11.5–15.5)
WBC: 13.2 10*3/uL — ABNORMAL HIGH (ref 4.0–10.5)

## 2012-02-08 LAB — URINALYSIS, ROUTINE W REFLEX MICROSCOPIC
Glucose, UA: NEGATIVE mg/dL
Ketones, ur: 40 mg/dL — AB
Protein, ur: 30 mg/dL — AB

## 2012-02-08 LAB — COMPREHENSIVE METABOLIC PANEL
ALT: 18 U/L (ref 0–35)
AST: 21 U/L (ref 0–37)
CO2: 17 mEq/L — ABNORMAL LOW (ref 19–32)
Chloride: 87 mEq/L — ABNORMAL LOW (ref 96–112)
Creatinine, Ser: 0.6 mg/dL (ref 0.50–1.10)
GFR calc non Af Amer: >90 mL/min (ref >90)
Total Bilirubin: 0.4 mg/dL (ref 0.3–1.2)

## 2012-02-08 LAB — URINE MICROSCOPIC-ADD ON

## 2012-02-08 MED ORDER — SODIUM CHLORIDE 0.9 % IV BOLUS (SEPSIS)
1000.0000 mL | Freq: Once | INTRAVENOUS | Status: AC
  Administered 2012-02-08: 1000 mL via INTRAVENOUS

## 2012-02-08 MED ORDER — METOPROLOL SUCCINATE ER 50 MG PO TB24
50.0000 mg | ORAL_TABLET | Freq: Two times a day (BID) | ORAL | Status: DC
  Administered 2012-02-08 – 2012-02-10 (×4): 50 mg via ORAL
  Filled 2012-02-08 (×6): qty 1

## 2012-02-08 MED ORDER — ONDANSETRON HCL 4 MG/2ML IJ SOLN: 4.0000 mg | Freq: Once | INTRAMUSCULAR | Status: AC

## 2012-02-08 MED ORDER — MORPHINE SULFATE 2 MG/ML IJ SOLN
1.0000 mg | INTRAMUSCULAR | Status: DC | PRN
  Administered 2012-02-08 – 2012-02-10 (×4): 1 mg via INTRAVENOUS
  Filled 2012-02-08 (×5): qty 1

## 2012-02-08 MED ORDER — ONDANSETRON HCL 4 MG/2ML IJ SOLN
4.0000 mg | Freq: Once | INTRAMUSCULAR | Status: AC
  Administered 2012-02-08: 4 mg via INTRAVENOUS
  Filled 2012-02-08: qty 2

## 2012-02-08 MED ORDER — DEXTROSE-NACL 5-0.45 % IV SOLN
INTRAVENOUS | Status: DC
  Administered 2012-02-08: 22:00:00 via INTRAVENOUS

## 2012-02-08 MED ORDER — SODIUM CHLORIDE 0.9 % IJ SOLN
3.0000 mL | Freq: Two times a day (BID) | INTRAMUSCULAR | Status: DC
  Administered 2012-02-08 – 2012-02-09 (×2): 3 mL via INTRAVENOUS

## 2012-02-08 MED ORDER — SODIUM CHLORIDE 0.9 % IV SOLN
INTRAVENOUS | Status: AC
  Administered 2012-02-08: 21:00:00 via INTRAVENOUS

## 2012-02-08 MED ORDER — METRONIDAZOLE IN NACL 5-0.79 MG/ML-% IV SOLN
500.0000 mg | Freq: Three times a day (TID) | INTRAVENOUS | Status: DC
  Administered 2012-02-08 – 2012-02-10 (×5): 500 mg via INTRAVENOUS
  Filled 2012-02-08 (×8): qty 100

## 2012-02-08 MED ORDER — CIPROFLOXACIN IN D5W 400 MG/200ML IV SOLN
400.0000 mg | Freq: Two times a day (BID) | INTRAVENOUS | Status: DC
  Administered 2012-02-08 – 2012-02-10 (×4): 400 mg via INTRAVENOUS
  Filled 2012-02-08 (×5): qty 200

## 2012-02-08 MED ORDER — MORPHINE SULFATE 4 MG/ML IJ SOLN
4.0000 mg | Freq: Once | INTRAMUSCULAR | Status: AC
  Administered 2012-02-08: 4 mg via INTRAVENOUS
  Filled 2012-02-08: qty 1

## 2012-02-08 MED ORDER — ONDANSETRON HCL 4 MG/2ML IJ SOLN
4.0000 mg | Freq: Four times a day (QID) | INTRAMUSCULAR | Status: DC | PRN
  Administered 2012-02-08 – 2012-02-10 (×3): 4 mg via INTRAVENOUS
  Filled 2012-02-08 (×3): qty 2

## 2012-02-08 NOTE — Progress Notes (Signed)
ANTIBIOTIC CONSULT NOTE - INITIAL  Pharmacy Consult for Cipro Indication: colitis  Allergies  Allergen Reactions  . Shellfish Allergy Anaphylaxis  . Blue Dyes (Parenteral)     Patient Measurements: Height: 5\' 2"  (157.5 cm) Weight: 165 lb (74.844 kg) IBW/kg (Calculated) : 50.1   Vital Signs: Temp: 97.9 F (36.6 C) (11/25 1618) BP: 149/92 mmHg (11/25 2045) Pulse Rate: 122  (11/25 2045)  Labs:  Basename 02/08/12 1750  WBC 13.2*  HGB 14.1  PLT 191  LABCREA --  CREATININE 0.60   Estimated Creatinine Clearance: 83.2 ml/min (by C-G formula based on Cr of 0.6).  Microbiology:   - stool sample to be sent for culture and C diff PCR  Medical History: Past Medical History  Diagnosis Date  . Hypertension   . Ankylosing spondylitis   . Bursitis of hip, right   . GE reflux   . Vitamin D deficiency   . Anxiety   . Hyperthyroidism   . Breast cancer   . Hiatal hernia   . Ischemic bowel disease   . Cardiac arrhythmia   . Arthritis    Assessment:   Recent colitis (October 2013) and treatment with Cipro and Flagyl.  Being admitted tonight with abdominal pain, diarrhea and vomiting.  Tmax 99, WBC elevated. Concern for colitis/diverticulitis.  Good renal function.  Goal of Therapy:  appropriate Cipro dose for renal function and infection  Plan:   Cipro 400 mg IV q12hrs.  Will follow renal function, but do not expect any need to adjust regimen.  Also starting Metronidazole 500 mg IV q8hrs.  Will follow up C diff PCR and stool culture.   Dennie Fetters, RPh Pager: [phone] 02/08/2012,10:15 PM

## 2012-02-08 NOTE — ED Provider Notes (Signed)
History     CSN: [id number]  Arrival date & time 02/08/12  1613   First MD Initiated Contact with Patient 02/08/12 1642      Chief Complaint  Patient presents with  . Diarrhea  . Emesis  . Abdominal Pain    (Consider location/radiation/quality/duration/timing/severity/associated sxs/prior treatment) Patient is a 46 y.o. female presenting with diarrhea, vomiting, and abdominal pain. The history is provided by the patient.  Diarrhea The primary symptoms include abdominal pain, vomiting and diarrhea. Primary symptoms do not include fever or nausea. The illness began today. The onset was sudden. The problem has not changed since onset. The abdominal pain began today. The abdominal pain has been unchanged since its onset. The abdominal pain is located in the LLQ. The abdominal pain does not radiate. The severity of the abdominal pain is 5/10. The abdominal pain is relieved by nothing.  The illness is also significant for chills and anorexia.  Emesis  Associated symptoms include abdominal pain, chills and diarrhea. Pertinent negatives include no fever.  Abdominal Pain The primary symptoms of the illness include abdominal pain, vomiting and diarrhea. The primary symptoms of the illness do not include fever or nausea.  Additional symptoms associated with the illness include chills and anorexia.    Past Medical History  Diagnosis Date  . Hypertension   . Ankylosing spondylitis   . Bursitis of hip, right   . GE reflux   . Vitamin D deficiency   . Anxiety   . Hyperthyroidism   . Breast cancer   . Hiatal hernia   . Ischemic bowel disease   . Cardiac arrhythmia   . Arthritis     Past Surgical History  Procedure Date  . Breast lumpectomy     right  . Abdominal hysterectomy   . Mastectomy     double    Family History  Problem Relation Age of Onset  . Breast cancer Maternal Aunt   . Colitis Brother   . Diabetes Mother   . Hypertension Mother   . Hyperlipidemia Mother      History  Substance Use Topics  . Smoking status: Never Smoker   . Smokeless tobacco: Never Used  . Alcohol Use: No    OB History    Grav Para Term Preterm Abortions TAB SAB Ect Mult Living                  Review of Systems  Constitutional: Positive for chills. Negative for fever.  Gastrointestinal: Positive for vomiting, abdominal pain, diarrhea and anorexia. Negative for nausea.  All other systems reviewed and are negative.    Allergies  Shellfish allergy and Blue dyes (parenteral)  Home Medications   Current Outpatient Rx  Name  Route  Sig  Dispense  Refill  . METOPROLOL SUCCINATE ER 50 MG PO TB24   Oral   Take 1 tablet (50 mg total) by mouth 2 (two) times daily.   60 tablet   5   . PEG-KCL-NACL-NASULF-NA ASC-C 100 G PO SOLR   Oral   Take 1 kit (100 g total) by mouth once.   1 kit   0   . VITAMIN D (ERGOCALCIFEROL) 50000 UNITS PO CAPS      TAKE ONE CAPSULE BY MOUTH ONCE A WEEK   6 capsule   3     BP 157/116  Pulse 119  Temp 97.9 F (36.6 C)  Resp 18  SpO2 99%  Physical Exam  Nursing note and vitals reviewed. Constitutional: She  is oriented to person, place, and time. She appears well-developed and well-nourished. No distress.  HENT:  Head: Normocephalic and atraumatic.  Eyes: EOM are normal. Pupils are equal, round, and reactive to light.  Neck: Normal range of motion. Neck supple.  Cardiovascular: Normal rate and regular rhythm.  Exam reveals no friction rub.   No murmur heard. Pulmonary/Chest: Effort normal and breath sounds normal. No respiratory distress. She has no wheezes. She has no rales.  Abdominal: Soft. She exhibits no distension. There is tenderness (LLQ, suprapubic). There is no rebound.       Not rebound, guarding, rigidity, no concern for peritonitis.  Musculoskeletal: Normal range of motion. She exhibits no edema.  Neurological: She is alert and oriented to person, place, and time.  Skin: She is not diaphoretic.    ED  Course  Procedures (including critical care time)  Labs Reviewed  CBC WITH DIFFERENTIAL - Abnormal; Notable for the following:    WBC 13.2 (*)     Neutrophils Relative 93 (*)     Neutro Abs 12.3 (*)     Lymphocytes Relative 2 (*)     Lymphs Abs 0.3 (*)     All other components within normal limits  COMPREHENSIVE METABOLIC PANEL - Abnormal; Notable for the following:    Sodium 158 (*)     Chloride 87 (*)     CO2 17 (*)     Glucose, Bld 113 (*)     All other components within normal limits  URINALYSIS, ROUTINE W REFLEX MICROSCOPIC - Abnormal; Notable for the following:    Hgb urine dipstick TRACE (*)     Ketones, ur 40 (*)     Protein, ur 30 (*)     Leukocytes, UA MODERATE (*)     All other components within normal limits  LACTIC ACID, PLASMA - Abnormal; Notable for the following:    Lactic Acid, Venous 3.8 (*)     All other components within normal limits  URINE MICROSCOPIC-ADD ON - Abnormal; Notable for the following:    Squamous Epithelial / LPF FEW (*)     Bacteria, UA FEW (*)     All other components within normal limits  LIPASE, BLOOD  CLOSTRIDIUM DIFFICILE BY PCR  URINE CULTURE  COMPREHENSIVE METABOLIC PANEL  LACTIC ACID, PLASMA  CBC WITH DIFFERENTIAL  STOOL CULTURE  TSH   No results found.   1. Nausea vomiting and diarrhea   2. HTN (hypertension)   3. Hypernatremia   4. Metabolic acidosis       MDM   16X with hx of recent colitis on Cipro/Flagyl p/w LLQ abdominal pain, watery diarrhea, vomiting. States she feels just like when she had colitis/diverticulitis previously and she does not want a CT scan. Denies fevers today. Afebrile, tachycardic, hypertensive. LLQ pain on exam. Sinus tachycardia on the monitor. Denies vaginal or urinary complaints. Concern for diverticulitis/colitis again with an element of dehydration. Labs ordered, C. Diff ordered.  Labs show severe dehydration with hypernatremia, decreased bicarb. Admitted for fluids to hospitalist.        Elwin Mocha, MD 02/08/12 2352

## 2012-02-08 NOTE — Telephone Encounter (Signed)
Pt needs to call Dr. Russella Dar back and tell him she is having problems right away.

## 2012-02-08 NOTE — ED Notes (Signed)
Pt was diagnosed with colitis on OCT 21 and it resolved.  Symptoms returned this morning and patient reports lower abdominal pain, diarrhea, and vomiting

## 2012-02-08 NOTE — H&P (Addendum)
Sonya Franklin is an 46 y.o. female.   Patient was seen and examined on February 08, 2012. PCP - Dr. Lenord Fellers. Chief Complaint: Nausea vomiting abdominal pain and diarrhea. HPI: 46 year old female with history of hypertension, ankylosing spondylitis, history of breast cancer status post bilateral mastectomy and chemotherapy, presents with complaints of nausea vomiting abdominal pain and diarrhea. Patient's symptoms started today morning with multiple episodes of nausea vomiting and diarrhea with no blood in the vomitus or diarrhea. Patient has subjective feeling of chills but no fever. Patient was feeling very tired and weak. Patient due to the persistent symptoms presented to the ER. In the ER patient was found to be tachycardic and labs showed increased lactic acid level with leukocytosis and hypernatremia. At this time patient has been admitted for further management. Patient describes her abdominal pain mostly as colicky in nature. Patient had similar symptoms last month in October when patient had CT abdomen pelvis which showed enterocolitis. Patient was prescribed Cipro and Flagyl. Patient had followed up with Dr. Russella Dar who was declined to have outpatient colonoscopy done. Patient states since October she also has been having headaches. She had stopped taking lisinopril for last 3 weeks because she feels her headaches has been worsened because of the lisinopril. Denies any focal deficits. During my exam time patient denied any headache.  Past Medical History  Diagnosis Date  . Hypertension   . Ankylosing spondylitis   . Bursitis of hip, right   . GE reflux   . Vitamin D deficiency   . Anxiety   . Hyperthyroidism   . Breast cancer   . Hiatal hernia   . Ischemic bowel disease   . Cardiac arrhythmia   . Arthritis     Past Surgical History  Procedure Date  . Breast lumpectomy     right  . Abdominal hysterectomy   . Mastectomy     double    Family History  Problem Relation Age of  Onset  . Breast cancer Maternal Aunt   . Colitis Brother   . Diabetes Mother   . Hypertension Mother   . Hyperlipidemia Mother    Social History:  reports that she has never smoked. She has never used smokeless tobacco. She reports that she does not drink alcohol or use illicit drugs.  Allergies:  Allergies  Allergen Reactions  . Shellfish Allergy Anaphylaxis  . Blue Dyes (Parenteral)      (Not in a hospital admission)  Results for orders placed during the hospital encounter of 02/08/12 (from the past 48 hour(s))  CBC WITH DIFFERENTIAL     Status: Abnormal   Collection Time   02/08/12  5:50 PM      Component Value Range Comment   WBC 13.2 (*) 4.0 - 10.5 K/uL    RBC 4.90  3.87 - 5.11 MIL/uL    Hemoglobin 14.1  12.0 - 15.0 g/dL    HCT 16.1  09.6 - 04.5 %    MCV 85.9  78.0 - 100.0 fL    MCH 28.8  26.0 - 34.0 pg    MCHC 33.5  30.0 - 36.0 g/dL    RDW 40.9  81.1 - 91.4 %    Platelets 191  150 - 400 K/uL    Neutrophils Relative 93 (*) 43 - 77 %    Neutro Abs 12.3 (*) 1.7 - 7.7 K/uL    Lymphocytes Relative 2 (*) 12 - 46 %    Lymphs Abs 0.3 (*) 0.7 - 4.0 K/uL  Monocytes Relative 4  3 - 12 %    Monocytes Absolute 0.6  0.1 - 1.0 K/uL    Eosinophils Relative 0  0 - 5 %    Eosinophils Absolute 0.0  0.0 - 0.7 K/uL    Basophils Relative 0  0 - 1 %    Basophils Absolute 0.0  0.0 - 0.1 K/uL   COMPREHENSIVE METABOLIC PANEL     Status: Abnormal   Collection Time   02/08/12  5:50 PM      Component Value Range Comment   Sodium 158 (*) 135 - 145 mEq/L    Potassium 3.6  3.5 - 5.1 mEq/L    Chloride 87 (*) 96 - 112 mEq/L    CO2 17 (*) 19 - 32 mEq/L    Glucose, Bld 113 (*) 70 - 99 mg/dL    BUN 11  6 - 23 mg/dL    Creatinine, Ser 1.61  0.50 - 1.10 mg/dL    Calcium 8.6  8.4 - 09.6 mg/dL    Total Protein 7.5  6.0 - 8.3 g/dL    Albumin 3.8  3.5 - 5.2 g/dL    AST 21  0 - 37 U/L    ALT 18  0 - 35 U/L    Alkaline Phosphatase 71  39 - 117 U/L    Total Bilirubin 0.4  0.3 - 1.2 mg/dL     GFR calc non Af Amer >90  >90 mL/min    GFR calc Af Amer >90  >90 mL/min   LIPASE, BLOOD     Status: Normal   Collection Time   02/08/12  5:50 PM      Component Value Range Comment   Lipase 18  11 - 59 U/L   LACTIC ACID, PLASMA     Status: Abnormal   Collection Time   02/08/12  5:50 PM      Component Value Range Comment   Lactic Acid, Venous 3.8 (*) 0.5 - 2.2 mmol/L    No results found.  Review of Systems  Constitutional: Positive for chills.  HENT: Negative.   Eyes: Negative.   Respiratory: Negative.   Cardiovascular: Negative.   Gastrointestinal: Positive for nausea, vomiting, abdominal pain and diarrhea.  Genitourinary: Negative.   Musculoskeletal: Negative.   Skin: Negative.   Neurological: Negative.   Endo/Heme/Allergies: Negative.   Psychiatric/Behavioral: Negative.     Blood pressure 170/95, pulse 118, temperature 97.9 F (36.6 C), resp. rate 18, SpO2 99.00%. Physical Exam  Constitutional: She is oriented to person, place, and time. She appears well-developed and well-nourished. No distress.  HENT:  Head: Normocephalic and atraumatic.  Right Ear: External ear normal.  Left Ear: External ear normal.  Nose: Nose normal.  Mouth/Throat: Oropharynx is clear and moist. No oropharyngeal exudate.  Eyes: Conjunctivae normal are normal. Pupils are equal, round, and reactive to light. Right eye exhibits no discharge. Left eye exhibits no discharge. No scleral icterus.  Neck: Normal range of motion. Neck supple.  Cardiovascular: Regular rhythm.        Tachycardia.  Respiratory: Effort normal and breath sounds normal. No respiratory distress. She has no wheezes. She has no rales.  GI: Soft. Bowel sounds are normal. She exhibits no distension. There is tenderness. There is no rebound and no guarding.  Musculoskeletal: She exhibits no edema and no tenderness.  Neurological: She is alert and oriented to person, place, and time.       Moves all extremities.  Skin: Skin is warm  and dry.  She is not diaphoretic.     Assessment/Plan #1. Nausea vomiting abdominal pain and diarrhea - at this time patient's symptoms are most likely secondary to enterocolitis and the differentials include infectious versus inflammatory versus ischemic.Patient has refused a repeat CT of the abdomen and pelvis this time since one was done last month. I have discussed with on-call gastroenterologist Dr. Leone Payor who will be seeing patient in consult. At this time Dr. Leone Payor has advised aggressive hydration with IV antibiotics (Cipro and Flagyl). We will recheck lactic acid level in a.m. along with a metabolic panel. Since patient is tolerating oral we will keep patient on clear liquid diet. Check stool for C. difficile and cultures. #2. Hypernatremia - probably from severe dehydration. Patient had received 2 L normal saline in the ER. I am going to continue with D5 half normal saline and recheck metabolic panel in a.m. #3. Metabolic acidosis - probably from severe dehydration and lactic acidosis. Closely follow metabolic panel. Aggressive hydration. #4. Hypertension -continue metoprolol. Keep patient on clear IV hydralazine for systolic blood pressure more than 200. Patient usually takes lisinopril which patient has stopped taking because of headache which she feels is from lisinopril. She has not taken lisinopril for last 3 weeks. #5. Headache - patient states that she has been having chronic headaches and usually happens during the fall season. Chest denies any focal deficits. At the time of my exam patient did not have any headache. #6. History of breast cancer status post bilateral mastectomy and chemotherapy. #7. Possible UTI - check urine cultures. Patient's on Cipro.  Patient presently is tachycardic but does not look septic. We will continue aggressive IV hydration and closely observe presently in telemetry. Will check thyroid panel.  CODE STATUS - full code.   Eduard Clos 02/08/2012, 9:27 PM

## 2012-02-08 NOTE — Telephone Encounter (Signed)
Left message for patient to call back  

## 2012-02-09 ENCOUNTER — Inpatient Hospital Stay (HOSPITAL_COMMUNITY): Payer: Medicare Other

## 2012-02-09 DIAGNOSIS — I10 Essential (primary) hypertension: Secondary | ICD-10-CM

## 2012-02-09 LAB — BASIC METABOLIC PANEL
BUN: 8 mg/dL (ref 6–23)
BUN: 7 mg/dL (ref 6–23)
CO2: 25 mEq/L (ref 19–32)
Calcium: 8.3 mg/dL — ABNORMAL LOW (ref 8.4–10.5)
Creatinine, Ser: 0.67 mg/dL (ref 0.50–1.10)
GFR calc Af Amer: >90 mL/min (ref >90)
GFR calc non Af Amer: >90 mL/min (ref >90)
GFR calc non Af Amer: >90 mL/min (ref >90)
Glucose, Bld: 94 mg/dL (ref 70–99)
Potassium: 3.6 mEq/L (ref 3.5–5.1)
Sodium: 138 mEq/L (ref 135–145)

## 2012-02-09 LAB — TSH: TSH: 0.056 u[IU]/mL — ABNORMAL LOW (ref 0.350–4.500)

## 2012-02-09 LAB — COMPREHENSIVE METABOLIC PANEL
ALT: 17 U/L (ref 0–35)
AST: 19 U/L (ref 0–37)
Albumin: 3.1 g/dL — ABNORMAL LOW (ref 3.5–5.2)
Alkaline Phosphatase: 58 U/L (ref 39–117)
BUN: 7 mg/dL (ref 6–23)
Chloride: 107 mEq/L (ref 96–112)
Potassium: 3.2 mEq/L — ABNORMAL LOW (ref 3.5–5.1)
Sodium: 138 mEq/L (ref 135–145)
Total Bilirubin: 0.4 mg/dL (ref 0.3–1.2)

## 2012-02-09 LAB — CBC WITH DIFFERENTIAL/PLATELET
Basophils Absolute: 0 10*3/uL (ref 0.0–0.1)
Basophils Relative: 0 % (ref 0–1)
Hemoglobin: 10.5 g/dL — ABNORMAL LOW (ref 12.0–15.0)
MCHC: 33.3 g/dL (ref 30.0–36.0)
Monocytes Relative: 6 % (ref 3–12)
Neutro Abs: 7.7 10*3/uL (ref 1.7–7.7)
Neutrophils Relative %: 87 % — ABNORMAL HIGH (ref 43–77)
Platelets: 189 10*3/uL (ref 150–400)
RBC: 3.75 MIL/uL — ABNORMAL LOW (ref 3.87–5.11)

## 2012-02-09 LAB — URINE CULTURE: Colony Count: 60000

## 2012-02-09 LAB — LACTIC ACID, PLASMA: Lactic Acid, Venous: 0.8 mmol/L (ref 0.5–2.2)

## 2012-02-09 LAB — T4, FREE: Free T4: 1.03 ng/dL (ref 0.80–1.80)

## 2012-02-09 MED ORDER — HYDRALAZINE HCL 20 MG/ML IJ SOLN
10.0000 mg | INTRAMUSCULAR | Status: DC | PRN
  Filled 2012-02-09: qty 0.5

## 2012-02-09 MED ORDER — POTASSIUM CHLORIDE CRYS ER 20 MEQ PO TBCR
40.0000 meq | EXTENDED_RELEASE_TABLET | Freq: Once | ORAL | Status: AC
  Administered 2012-02-09: 40 meq via ORAL
  Filled 2012-02-09: qty 2

## 2012-02-09 MED ORDER — ENOXAPARIN SODIUM 40 MG/0.4ML ~~LOC~~ SOLN
40.0000 mg | SUBCUTANEOUS | Status: DC
  Filled 2012-02-09 (×2): qty 0.4

## 2012-02-09 MED ORDER — SODIUM CHLORIDE 0.9 % IV SOLN
INTRAVENOUS | Status: DC
  Administered 2012-02-09 (×2): via INTRAVENOUS
  Administered 2012-02-09: 100 mL/h via INTRAVENOUS
  Administered 2012-02-10: 06:00:00 via INTRAVENOUS

## 2012-02-09 MED ORDER — ACETAMINOPHEN 325 MG PO TABS
650.0000 mg | ORAL_TABLET | Freq: Four times a day (QID) | ORAL | Status: DC | PRN
  Administered 2012-02-09 (×3): 650 mg via ORAL
  Filled 2012-02-09 (×3): qty 2

## 2012-02-09 NOTE — Progress Notes (Signed)
Pt.'s HR=130 s in the monitor;;V/S TAKEN=T=98.3;P=130;B/P=133/76;R=20;O2 SAT=100 % RA.;DR.Kakrakandy made aware. & ordered stat labs. & pt.is c/o headache.Dr.Kakrakandy ordered Ct of the head.

## 2012-02-09 NOTE — Progress Notes (Signed)
PATIENT DETAILS Name: Sonya Franklin Age: 46 y.o. Sex: female Date of Birth: 10-Apr-1965 Admit Date: 02/08/2012 Admitting Physician Eduard Clos, MD ZOX:[id number],WUJW J, MD  Subjective: Feeling a lot better this am  Assessment/Plan: Principal Problem:  *Nausea vomiting and diarrhea -second episode-had a similar episode last month -significantly better-almost all her symptoms have resolved -had colitis/enteritis in her prior CT Scan Abdomen-therefore on Cipro/Flagyl -GI already consulted by Dr Sibyl Parr further recommendations -C Diff PCR-negative -in interim c/w supportive care  Active Problems:  HTN (hypertension) -moderate control -c/w Lopressor   Hypernatremia -resolved -2/2 to dehydrations   Metabolic acidosis -resolved with IVF  Disposition: Remain inpatient  DVT Prophylaxis: Prophylactic Lovenox   Code Status: Full code  Procedures:  None  CONSULTS:  GI  PHYSICAL EXAM: Vital signs in last 24 hours: Filed Vitals:   02/08/12 2213 02/09/12 0100 02/09/12 0517 02/09/12 0827  BP: 159/98 133/76 136/84 130/85  Pulse: 126 130 101 96  Temp: 99 F (37.2 C) 98.3 F (36.8 C) 99.3 F (37.4 C)   TempSrc: Oral Oral Oral   Resp: 22 20 18    Height: 5\' 2"  (1.575 m)     Weight: 74.8 kg (164 lb 14.5 oz)  80.1 kg (176 lb 9.4 oz)   SpO2: 96% 100% 98%     Weight change:  Body mass index is 32.30 kg/(m^2).   Gen Exam: Awake and alert with clear speech.   Neck: Supple, No JVD.   Chest: B/L Clear.   CVS: S1 S2 Regular, no murmurs.  Abdomen: soft, BS +, non tender, non distended.  Extremities: no edema, lower extremities warm to touch. Neurologic: Non Focal.  Skin: No Rash.   Wounds: N/A.    Intake/Output from previous day:  Intake/Output Summary (Last 24 hours) at 02/09/12 1456 Last data filed at 02/09/12 1191  Gross per 24 hour  Intake 1373.33 ml  Output    550 ml  Net 823.33 ml     LAB RESULTS: CBC  Lab 02/09/12 0648 02/08/12 1750    WBC 8.9 13.2*  HGB 10.5* 14.1  HCT 31.5* 42.1  PLT 189 191  MCV 84.0 85.9  MCH 28.0 28.8  MCHC 33.3 33.5  RDW 13.1 12.8  LYMPHSABS 0.6* 0.3*  MONOABS 0.6 0.6  EOSABS 0.0 0.0  BASOSABS 0.0 0.0  BANDABS -- --    Chemistries   Lab 02/09/12 0925 02/09/12 0648 02/09/12 0001 02/08/12 1750  NA 139 138 138 158*  K 3.1* 3.2* 3.6 3.6  CL 107 107 104 87*  CO2 25 22 20  17*  GLUCOSE 94 99 136* 113*  BUN 7 7 8 11   CREATININE 0.67 0.69 0.60 0.60  CALCIUM 8.3* 8.0* 8.6 8.6  MG -- -- -- --    CBG: No results found for this basename: GLUCAP:5 in the last 168 hours  GFR Estimated Creatinine Clearance: 86.1 ml/min (by C-G formula based on Cr of 0.67).  Coagulation profile No results found for this basename: INR:5,PROTIME:5 in the last 168 hours  Cardiac Enzymes No results found for this basename: CK:3,CKMB:3,TROPONINI:3,MYOGLOBIN:3 in the last 168 hours  No components found with this basename: POCBNP:3 No results found for this basename: DDIMER:2 in the last 72 hours No results found for this basename: HGBA1C:2 in the last 72 hours No results found for this basename: CHOL:2,HDL:2,LDLCALC:2,TRIG:2,CHOLHDL:2,LDLDIRECT:2 in the last 72 hours  Basename 02/09/12 0001  TSH 0.056*  T4TOTAL --  T3FREE 3.4  THYROIDAB --   No results found for this basename: VITAMINB12:2,FOLATE:2,FERRITIN:2,TIBC:2,IRON:2,RETICCTPCT:2  in the last 72 hours  Basename 03/05/12 1750  LIPASE 18  AMYLASE --    Urine Studies No results found for this basename: UACOL:2,UAPR:2,USPG:2,UPH:2,UTP:2,UGL:2,UKET:2,UBIL:2,UHGB:2,UNIT:2,UROB:2,ULEU:2,UEPI:2,UWBC:2,URBC:2,UBAC:2,CAST:2,CRYS:2,UCOM:2,BILUA:2 in the last 72 hours  MICROBIOLOGY: Recent Results (from the past 240 hour(s))  CLOSTRIDIUM DIFFICILE BY PCR     Status: Normal   Collection Time   02/09/12  8:32 AM      Component Value Range Status Comment   C difficile by pcr NEGATIVE  NEGATIVE Final     RADIOLOGY STUDIES/RESULTS: Dg Abd 1  View  March 05, 2012  *RADIOLOGY REPORT*  Clinical Data: Abdominal pain, nausea, vomiting and dehydration.  ABDOMEN - 1 VIEW  Comparison: CT of the abdomen and pelvis from 12/21/2011  Findings: The visualized bowel gas pattern is unremarkable. Scattered air filled loops of small bowel are seen; no abnormal dilatation of small bowel loops is seen to suggest small bowel obstruction.  No free intra-abdominal air is identified, though evaluation for free air is limited on a single supine view.  The visualized osseous structures are within normal limits; the sacroiliac joints are unremarkable in appearance.  IMPRESSION: Unremarkable bowel gas pattern; no free intra-abdominal air seen.   Original Report Authenticated By: Tonia Ghent, M.D.    Ct Head Wo Contrast  02/09/2012  *RADIOLOGY REPORT*  Clinical Data: Ongoing headaches for several weeks.  CT HEAD WITHOUT CONTRAST  Technique:  Contiguous axial images were obtained from the base of the skull through the vertex without contrast.  Comparison: None.  Findings: There is no evidence of acute infarction, mass lesion, or intra- or extra-axial hemorrhage on CT.  The posterior fossa, including the cerebellum, brainstem and fourth ventricle, is within normal limits.  The third and lateral ventricles, and basal ganglia are unremarkable in appearance.  The cerebral hemispheres are symmetric in appearance, with normal gray- white differentiation.  No mass effect or midline shift is seen.  There is no evidence of fracture; visualized osseous structures are unremarkable in appearance.  The visualized portions of the orbits are within normal limits.  The paranasal sinuses and mastoid air cells are well-aerated.  No significant soft tissue abnormalities are seen.  IMPRESSION: Unremarkable noncontrast CT of the head.   Original Report Authenticated By: Tonia Ghent, M.D.     MEDICATIONS: Scheduled Meds:   . [COMPLETED] sodium chloride   Intravenous STAT  . ciprofloxacin   400 mg Intravenous Q12H  . metoprolol succinate  50 mg Oral BID  . metronidazole  500 mg Intravenous Q8H  . [COMPLETED]  morphine injection  4 mg Intravenous Once  . [COMPLETED] ondansetron (ZOFRAN) IV  4 mg Intravenous Once  . [COMPLETED] ondansetron (ZOFRAN) IV  4 mg Intravenous Once  . [COMPLETED] sodium chloride  1,000 mL Intravenous Once  . [COMPLETED] sodium chloride  1,000 mL Intravenous Once  . sodium chloride  3 mL Intravenous Q12H   Continuous Infusions:   . sodium chloride 200 mL/hr at 02/09/12 0943  . [DISCONTINUED] dextrose 5 % and 0.45% NaCl 200 mL/hr at 03/05/12 2132   PRN Meds:.acetaminophen, hydrALAZINE, morphine injection, ondansetron  Antibiotics: Anti-infectives     Start     Dose/Rate Route Frequency Ordered Stop   March 05, 2012 2300   ciprofloxacin (CIPRO) IVPB 400 mg        400 mg 200 mL/hr over 60 Minutes Intravenous Every 12 hours 2012-03-05 2215     03-05-12 2230   metroNIDAZOLE (FLAGYL) IVPB 500 mg        500 mg 100 mL/hr over 60 Minutes  Intravenous Every 8 hours 02/08/12 2220             Jeoffrey Massed, MD  [facility] Pager:[phone]  If 7PM-7AM, please contact night-coverage [email or web address] Password [email or web address] 02/09/2012, 2:56 PM   LOS: 1 day

## 2012-02-09 NOTE — Care Management Note (Unsigned)
    Page 1 of 1   02/09/2012     4:18:47 PM   CARE MANAGEMENT NOTE 02/09/2012  Patient:  Sonya Franklin, Sonya Franklin   Account Number:  [id number]  Date Initiated:  02/09/2012  Documentation initiated by:  Letha Cape  Subjective/Objective Assessment:   dx n/v/d  admit- lives with son.     Action/Plan:   Anticipated DC Date:  02/12/2012   Anticipated DC Plan:  HOME/SELF CARE      DC Planning Services  CM consult      Choice offered to / List presented to:             Status of service:  In process, will continue to follow Medicare Important Message given?   (If response is "NO", the following Medicare IM given date fields will be blank) Date Medicare IM given:   Date Additional Medicare IM given:    Discharge Disposition:    Per UR Regulation:  Reviewed for med. necessity/level of care/duration of stay  If discussed at Long Length of Stay Meetings, dates discussed:    Comments:  02/09/12 16:17 Letha Cape RN, BSN [phone] patient lives with son,  pta independent, NCM will continue to follow for dc needs.

## 2012-02-09 NOTE — Progress Notes (Signed)
Dr.Kakrakandy made aware of lab.results & Ct of the head results.ordered to change IVF to NS at 200 cc/hr.

## 2012-02-09 NOTE — Telephone Encounter (Signed)
Attempted to reach the patient again this am.  Family states that she went to the ER yesterday and she is currently admitted

## 2012-02-10 DIAGNOSIS — R6889 Other general symptoms and signs: Secondary | ICD-10-CM

## 2012-02-10 LAB — CBC
HCT: 33.2 % — ABNORMAL LOW (ref 36.0–46.0)
MCH: 28.1 pg (ref 26.0–34.0)
MCV: 85.6 fL (ref 78.0–100.0)
Platelets: 185 10*3/uL (ref 150–400)
RDW: 13.3 % (ref 11.5–15.5)

## 2012-02-10 LAB — URINE CULTURE

## 2012-02-10 LAB — BASIC METABOLIC PANEL
BUN: 4 mg/dL — ABNORMAL LOW (ref 6–23)
Calcium: 8.9 mg/dL (ref 8.4–10.5)
Creatinine, Ser: 0.6 mg/dL (ref 0.50–1.10)
GFR calc Af Amer: >90 mL/min (ref >90)

## 2012-02-10 MED ORDER — CIPROFLOXACIN HCL 500 MG PO TABS: 500.0000 mg | ORAL_TABLET | Freq: Two times a day (BID) | ORAL | Status: DC

## 2012-02-10 MED ORDER — ONDANSETRON 8 MG PO TBDP: 4.0000 mg | ORAL_TABLET | Freq: Three times a day (TID) | ORAL | Status: DC | PRN

## 2012-02-10 MED ORDER — LOPERAMIDE HCL 2 MG PO CAPS
2.0000 mg | ORAL_CAPSULE | Freq: Four times a day (QID) | ORAL | Status: DC | PRN
  Administered 2012-02-10: 2 mg via ORAL
  Filled 2012-02-10: qty 1

## 2012-02-10 MED ORDER — METRONIDAZOLE 500 MG PO TABS: 500.0000 mg | ORAL_TABLET | Freq: Three times a day (TID) | ORAL | Status: DC

## 2012-02-10 NOTE — ED Provider Notes (Signed)
I saw and evaluated the patient, reviewed the resident's note and I agree with the findings and plan. Recent admit re colitis. Pt with return similar symptoms. Nvd. abd cramping/pain. No focal tenderness on exam. Iv fluids, labs. Admit.   Suzi Roots, MD 02/10/12 [phone]

## 2012-02-10 NOTE — Progress Notes (Signed)
NURSING PROGRESS NOTE  Sonya Franklin [id number] Discharge Data: 02/10/2012 4:27 PM Attending Provider: No att. providers found [id number],VHQI J, MD     Sherald Barge to be D/C'd Home per MD order.  Discussed with the patient the After Visit Summary and all questions fully answered. All IV's discontinued with no bleeding noted. All belongings returned to patient for patient to take home.   Last Vital Signs:  Blood pressure 141/96, pulse 95, temperature 98.9 F (37.2 C), temperature source Oral, resp. rate 18, height 5\' 2"  (1.575 m), weight 79.4 kg (175 lb 0.7 oz), SpO2 100.00%.  Discharge Medication List   Medication List     As of 02/10/2012  4:27 PM    STOP taking these medications         peg 3350 powder 100 G Solr   Commonly known as: MOVIPREP      TAKE these medications         ciprofloxacin 500 MG tablet   Commonly known as: CIPRO   Take 1 tablet (500 mg total) by mouth 2 (two) times daily.      metoprolol succinate 50 MG 24 hr tablet   Commonly known as: TOPROL-XL   Take 1 tablet (50 mg total) by mouth 2 (two) times daily.      metroNIDAZOLE 500 MG tablet   Commonly known as: FLAGYL   Take 1 tablet (500 mg total) by mouth 3 (three) times daily.      ondansetron 8 MG disintegrating tablet   Commonly known as: ZOFRAN-ODT   Take 0.5-1 tablets (4-8 mg total) by mouth every 8 (eight) hours as needed for nausea.      Vitamin D (Ergocalciferol) 50000 UNITS Caps   Commonly known as: DRISDOL   TAKE ONE CAPSULE BY MOUTH ONCE A WEEK        Chaniah Cisse, Elmarie Mainland, RN

## 2012-02-10 NOTE — Discharge Summary (Signed)
Physician Discharge Summary  Sonya Franklin WUJ:[id number] DOB: Mar 24, 1965 DOA: 02/08/2012  PCP: Margaree Mackintosh, MD  Admit date: 02/08/2012 Discharge date: 02/10/2012  Time spent: 40 minutes  Recommendations for Outpatient Follow-up:  1. Followup with Dr. Russella Dar  Discharge Diagnoses:  Principal Problem:  *Nausea vomiting and diarrhea Active Problems:  HTN (hypertension)  Hypernatremia  Metabolic acidosis   Discharge Condition: Stable  Diet recommendation: Regular avoid meat  Filed Weights   02/09/12 0517 02/10/12 0500 02/10/12 0532  Weight: 80.1 kg (176 lb 9.4 oz) 77.7 kg (171 lb 4.8 oz) 79.4 kg (175 lb 0.7 oz)    History of present illness:  46 year old female with history of hypertension, ankylosing spondylitis, history of breast cancer status post bilateral mastectomy and chemotherapy, presents with complaints of nausea vomiting abdominal pain and diarrhea. Patient's symptoms started today morning with multiple episodes of nausea vomiting and diarrhea with no blood in the vomitus or diarrhea. Patient has subjective feeling of chills but no fever. Patient was feeling very tired and weak. Patient due to the persistent symptoms presented to the ER. In the ER patient was found to be tachycardic and labs showed increased lactic acid level with leukocytosis and hypernatremia. At this time patient has been admitted for further management. Patient describes her abdominal pain mostly as colicky in nature.  Patient had similar symptoms last month in October when patient had CT abdomen pelvis which showed enterocolitis. Patient was prescribed Cipro and Flagyl. Patient had followed up with Dr. Russella Dar . She was scheduled to have colonoscopy as outpatient but she canceled it. Patient states since October she also has been having headaches. She had stopped taking lisinopril for last 3 weeks because she feels her headaches has been worsened because of the lisinopril. Denies any focal deficits. During  my exam time patient denied any headache.   Hospital Course:   1. Enterocolitis: Patient had similar episode about a month ago, she had enteritis/colitis on prior CT scan done in October and she was treated successfully with Cipro and Flagyl. She had negative C. difficile PCR, after admission to the hospital she was started empirically on Cipro and Flagyl. Her symptoms resolved quickly, she was on clear liquids and we were able to advance her diet pretty quickly. Patient has brother with Crohn's disease, as mentioned above she was followed with [facility] with Dr. Russella Dar. She was supposed to have colonoscopy done on 02/16/2012 but she canceled it and she says she will schedule one as soon as possible in January. Patient tolerating diet, she requested to be discharged because of the Thanksgiving day which is tomorrow. I discussed with gastroenterology PA Mrs. Clarita Leber, and she recommended for the patient to be seen as outpatient to followup with Dr. Russella Dar as soon as possible for colonoscopy/biopsy. Patient discharged on Cipro and Flagyl as well as Zofran for symptomatic nausea management. There is no CT scan was done this admission.  2. Nausea/vomiting and diarrhea: Secondary to enterocolitis, symptoms as mentioned above resolved completely. Patient was able to tolerate regular diet. She denies any melena.  3. Hypertension: Reasonable control during this hospital stay, she says she had headaches and she quit taking her lisinopril. We'll continue with the Lopressor.  4. Metabolic acidosis: Its lactic acidosis with lactic acid of 3.8 and Bicarbonate of 17 at the time of admission, Mrs. seems from the diarrhea and dehydration. This is resolved after IV fluid hydration.  5. Hypernatremia and hypokalemia: The metabolic derangement is secondary to her dehydration, after aggressive IV  fluid hydration and oral replacement of her potassium both sodium and potassium were okay before  discharge  Procedures:  None  Consultations:  None  Discharge Exam: Filed Vitals:   02/09/12 2115 02/10/12 0500 02/10/12 0532 02/10/12 1300  BP: 161/100  156/91 141/96  Pulse: 90  89 95  Temp: 98.4 F (36.9 C)  98.1 F (36.7 C) 98.9 F (37.2 C)  TempSrc:   Oral Oral  Resp:   18 18  Height:      Weight:  77.7 kg (171 lb 4.8 oz) 79.4 kg (175 lb 0.7 oz)   SpO2: 98%  98% 100%   General: Alert and awake, oriented x3, not in any acute distress. HEENT: anicteric sclera, pupils reactive to light and accommodation, EOMI CVS: S1-S2 clear, no murmur rubs or gallops Chest: clear to auscultation bilaterally, no wheezing, rales or rhonchi Abdomen: soft nontender, nondistended, normal bowel sounds, no organomegaly Extremities: no cyanosis, clubbing or edema noted bilaterally Neuro: Cranial nerves II-XII intact, no focal neurological deficits  Discharge Instructions  Discharge Orders    Future Orders Please Complete By Expires   Increase activity slowly          Medication List     As of 02/10/2012  3:09 PM    STOP taking these medications         peg 3350 powder 100 G Solr   Commonly known as: MOVIPREP      TAKE these medications         ciprofloxacin 500 MG tablet   Commonly known as: CIPRO   Take 1 tablet (500 mg total) by mouth 2 (two) times daily.      metoprolol succinate 50 MG 24 hr tablet   Commonly known as: TOPROL-XL   Take 1 tablet (50 mg total) by mouth 2 (two) times daily.      metroNIDAZOLE 500 MG tablet   Commonly known as: FLAGYL   Take 1 tablet (500 mg total) by mouth 3 (three) times daily.      ondansetron 8 MG disintegrating tablet   Commonly known as: ZOFRAN-ODT   Take 0.5-1 tablets (4-8 mg total) by mouth every 8 (eight) hours as needed for nausea.      Vitamin D (Ergocalciferol) 50000 UNITS Caps   Commonly known as: DRISDOL   TAKE ONE CAPSULE BY MOUTH ONCE A WEEK           Follow-up Information    Follow up with Margaree Mackintosh, MD. In  2 weeks.   Contact information:   [address] [place] [address] [phone]       Follow up with Judie Petit T. Russella Dar, MD,FACG. In 1 week.   Contact information:   [address] [place] [address] [phone]           The results of significant diagnostics from this hospitalization (including imaging, microbiology, ancillary and laboratory) are listed below for reference.    Significant Diagnostic Studies: Dg Abd 1 View  02/08/2012  *RADIOLOGY REPORT*  Clinical Data: Abdominal pain, nausea, vomiting and dehydration.  ABDOMEN - 1 VIEW  Comparison: CT of the abdomen and pelvis from 12/21/2011  Findings: The visualized bowel gas pattern is unremarkable. Scattered air filled loops of small bowel are seen; no abnormal dilatation of small bowel loops is seen to suggest small bowel obstruction.  No free intra-abdominal air is identified, though evaluation for free air is limited on a single supine view.  The visualized  osseous structures are within normal limits; the sacroiliac joints are unremarkable in appearance.  IMPRESSION: Unremarkable bowel gas pattern; no free intra-abdominal air seen.   Original Report Authenticated By: Tonia Ghent, M.D.    Ct Head Wo Contrast  02/09/2012  *RADIOLOGY REPORT*  Clinical Data: Ongoing headaches for several weeks.  CT HEAD WITHOUT CONTRAST  Technique:  Contiguous axial images were obtained from the base of the skull through the vertex without contrast.  Comparison: None.  Findings: There is no evidence of acute infarction, mass lesion, or intra- or extra-axial hemorrhage on CT.  The posterior fossa, including the cerebellum, brainstem and fourth ventricle, is within normal limits.  The third and lateral ventricles, and basal ganglia are unremarkable in appearance.  The cerebral hemispheres are symmetric in appearance, with normal gray- white differentiation.  No mass effect or midline shift is seen.  There is no  evidence of fracture; visualized osseous structures are unremarkable in appearance.  The visualized portions of the orbits are within normal limits.  The paranasal sinuses and mastoid air cells are well-aerated.  No significant soft tissue abnormalities are seen.  IMPRESSION: Unremarkable noncontrast CT of the head.   Original Report Authenticated By: Tonia Ghent, M.D.     Microbiology: Recent Results (from the past 240 hour(s))  URINE CULTURE     Status: Normal   Collection Time   02/08/12  9:27 PM      Component Value Range Status Comment   Specimen Description URINE, CLEAN CATCH   Final    Special Requests CX ADDED AT 2155 ON [id number]   Final    Culture  Setup Time 02/08/2012 22:20   Final    Colony Count 60,000 COLONIES/ML   Final    Culture     Final    Value: Multiple bacterial morphotypes present, none predominant. Suggest appropriate recollection if clinically indicated.   Report Status 02/09/2012 FINAL   Final   URINE CULTURE     Status: Normal   Collection Time   02/09/12 12:58 AM      Component Value Range Status Comment   Specimen Description URINE, CLEAN CATCH   Final    Special Requests NONE   Final    Culture  Setup Time 02/09/2012 08:37   Final    Colony Count 4,000 COLONIES/ML   Final    Culture INSIGNIFICANT GROWTH   Final    Report Status 02/10/2012 FINAL   Final   CLOSTRIDIUM DIFFICILE BY PCR     Status: Normal   Collection Time   02/09/12  8:32 AM      Component Value Range Status Comment   C difficile by pcr NEGATIVE  NEGATIVE Final   STOOL CULTURE     Status: Normal (Preliminary result)   Collection Time   02/09/12  8:32 AM      Component Value Range Status Comment   Specimen Description STOOL   Final    Special Requests NONE   Final    Culture Culture reincubated for better growth   Final    Report Status PENDING   Incomplete      Labs: Basic Metabolic Panel:  Lab 02/10/12 3086 02/09/12 0925 02/09/12 0648 02/09/12 0001 02/08/12 1750  NA 140 139 138  138 158*  K 3.6 3.1* 3.2* 3.6 3.6  CL 110 107 107 104 87*  CO2 21 25 22 20  17*  GLUCOSE 81 94 99 136* 113*  BUN 4* 7 7 8 11   CREATININE 0.60 0.67 0.69  0.60 0.60  CALCIUM 8.9 8.3* 8.0* 8.6 8.6  MG -- -- -- -- --  PHOS -- -- -- -- --   Liver Function Tests:  Lab 02/09/12 0648 02/08/12 1750  AST 19 21  ALT 17 18  ALKPHOS 58 71  BILITOT 0.4 0.4  PROT 6.4 7.5  ALBUMIN 3.1* 3.8    Lab 02/08/12 1750  LIPASE 18  AMYLASE --   No results found for this basename: AMMONIA:5 in the last 168 hours CBC:  Lab 02/10/12 0545 02/09/12 0648 02/08/12 1750  WBC 6.6 8.9 13.2*  NEUTROABS -- 7.7 12.3*  HGB 10.9* 10.5* 14.1  HCT 33.2* 31.5* 42.1  MCV 85.6 84.0 85.9  PLT 185 189 191   Cardiac Enzymes: No results found for this basename: CKTOTAL:5,CKMB:5,CKMBINDEX:5,TROPONINI:5 in the last 168 hours BNP: BNP (last 3 results) No results found for this basename: PROBNP:3 in the last 8760 hours CBG: No results found for this basename: GLUCAP:5 in the last 168 hours     Signed:  Joscelynn Brutus A  [facility] 02/10/2012, 3:09 PM

## 2012-02-13 LAB — STOOL CULTURE

## 2012-02-16 ENCOUNTER — Encounter: Payer: Medicare Other | Admitting: Gastroenterology

## 2012-02-29 ENCOUNTER — Encounter: Payer: Self-pay | Admitting: Internal Medicine

## 2012-02-29 ENCOUNTER — Ambulatory Visit (INDEPENDENT_AMBULATORY_CARE_PROVIDER_SITE_OTHER): Payer: Medicare Other | Admitting: Internal Medicine

## 2012-02-29 VITALS — BP 136/88 | HR 76 | Temp 98.2°F | Wt 166.0 lb

## 2012-02-29 DIAGNOSIS — Z862 Personal history of diseases of the blood and blood-forming organs and certain disorders involving the immune mechanism: Secondary | ICD-10-CM

## 2012-02-29 DIAGNOSIS — R1031 Right lower quadrant pain: Secondary | ICD-10-CM

## 2012-02-29 DIAGNOSIS — R109 Unspecified abdominal pain: Secondary | ICD-10-CM

## 2012-02-29 DIAGNOSIS — F411 Generalized anxiety disorder: Secondary | ICD-10-CM

## 2012-02-29 DIAGNOSIS — Z8639 Personal history of other endocrine, nutritional and metabolic disease: Secondary | ICD-10-CM

## 2012-02-29 DIAGNOSIS — K529 Noninfective gastroenteritis and colitis, unspecified: Secondary | ICD-10-CM

## 2012-02-29 DIAGNOSIS — K5289 Other specified noninfective gastroenteritis and colitis: Secondary | ICD-10-CM

## 2012-02-29 DIAGNOSIS — F419 Anxiety disorder, unspecified: Secondary | ICD-10-CM

## 2012-02-29 DIAGNOSIS — Z853 Personal history of malignant neoplasm of breast: Secondary | ICD-10-CM

## 2012-02-29 DIAGNOSIS — I10 Essential (primary) hypertension: Secondary | ICD-10-CM

## 2012-03-01 LAB — CBC WITH DIFFERENTIAL/PLATELET
Basophils Absolute: 0 10*3/uL (ref 0.0–0.1)
Basophils Relative: 0 % (ref 0–1)
Eosinophils Relative: 1 % (ref 0–5)
HCT: 36.4 % (ref 36.0–46.0)
Lymphocytes Relative: 31 % (ref 12–46)
MCHC: 33.2 g/dL (ref 30.0–36.0)
MCV: 85 fL (ref 78.0–100.0)
Monocytes Absolute: 0.6 10*3/uL (ref 0.1–1.0)
Platelets: 284 10*3/uL (ref 150–400)
RDW: 14.2 % (ref 11.5–15.5)
WBC: 7.2 10*3/uL (ref 4.0–10.5)

## 2012-03-19 ENCOUNTER — Encounter: Payer: Self-pay | Admitting: Internal Medicine

## 2012-03-19 DIAGNOSIS — R109 Unspecified abdominal pain: Secondary | ICD-10-CM | POA: Insufficient documentation

## 2012-03-19 DIAGNOSIS — Z8639 Personal history of other endocrine, nutritional and metabolic disease: Secondary | ICD-10-CM | POA: Insufficient documentation

## 2012-03-19 NOTE — Progress Notes (Signed)
  Subjective:    Patient ID: Sonya Franklin, female    DOB: May 17, 1965, 47 y.o.   MRN: [id number]  HPI In early October patient presented to the emergency department with acute abdominal pain. CT of the abdomen and pelvis showed inflammatory process of small bowel involving particularly the distal and terminal ileum. It was thought she could have an infectious process or inflammatory bowel disease. Ischemia was thought to be less likely. She was treated with Zofran and Dilaudid. Had mild leukocytosis. History of colonoscopy and endoscopy by Dr. Russella Dar in 1999 showing mild esophagitis. History of breast cancer and fatigue.  In late October, she was seen by Dr. Russella Dar and colonoscopy was arranged for early December. However she had recurrence of abdominal pain in late November and was admitted to the hospital. Stool for C. difficile and culture were both negative. She was treated with Cipro and Flagyl and improved. A CT of the head was done there because of recurrent headaches which proved to be negative. Now scheduled to followup with Dr. Russella Dar soon but has had recurrence of abdominal pain over the past couple of days and is concerned. No vomiting. Just vague pain. No constipation. No hematochezia or bright red blood per rectum. No diarrhea. No fever or chills.  History of low TSH levels followed by Dr. 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 Last seen by him March 2013.   Review of Systems     Objective:   Physical Exam abdomen is soft ,nondistended with no hepatosplenomegaly. Tender in right lower quadrant without rebound tenderness. Bowel sounds are present. Chest is clear to auscultation. Skin is warm and dry. HEENT exam TMs and pharynx are clear.         Assessment & Plan:  Recurrent abdominal pain which started in early October and has recurred. Suspect she may have inflammatory bowel disease and noninfectious process. Plan: Medrol 4 mg  6 day dosepak dosepak. Confirm followup with Dr. Russella Dar. Patient needs to stay on clear liquids until pain has resolved and then advance diet slowly over 2 weeks. CBC and sedimentation rate drawn. These are within normal limits.

## 2012-03-19 NOTE — Patient Instructions (Addendum)
Take steroid dosepak as prescribed. Followup with Dr. Russella Dar. Call if symptoms worsen.

## 2012-03-21 ENCOUNTER — Ambulatory Visit (AMBULATORY_SURGERY_CENTER): Payer: Medicare Other | Admitting: Gastroenterology

## 2012-03-21 ENCOUNTER — Encounter: Payer: Self-pay | Admitting: Gastroenterology

## 2012-03-21 VITALS — BP 167/95 | HR 80 | Temp 98.0°F | Resp 23 | Ht 61.5 in | Wt 166.0 lb

## 2012-03-21 DIAGNOSIS — R109 Unspecified abdominal pain: Secondary | ICD-10-CM

## 2012-03-21 DIAGNOSIS — R933 Abnormal findings on diagnostic imaging of other parts of digestive tract: Secondary | ICD-10-CM

## 2012-03-21 MED ORDER — HYOSCYAMINE SULFATE 0.125 MG SL SUBL: 0.1250 mg | SUBLINGUAL_TABLET | SUBLINGUAL | Status: DC | PRN

## 2012-03-21 MED ORDER — SODIUM CHLORIDE 0.9 % IV SOLN: 500.0000 mL | INTRAVENOUS | Status: DC

## 2012-03-21 NOTE — Progress Notes (Signed)
Patient did not experience any of the following events: a burn prior to discharge; a fall within the facility; wrong site/side/patient/procedure/implant event; or a hospital transfer or hospital admission upon discharge from the facility. (G8907) Patient did not have preoperative order for IV antibiotic SSI prophylaxis. (G8918)  

## 2012-03-21 NOTE — Op Note (Signed)
[facility] [address] [place], [address]   COLONOSCOPY PROCEDURE REPORT  PATIENT: Sonya Franklin, Sonya Franklin  MR#: [id number] BIRTHDATE: 29-May-1965 , 46  yrs. old GENDER: Female ENDOSCOPIST: Meryl Dare, MD, [facility] REFERRED HQ:[facility] Waymond Cera, M.D. PROCEDURE DATE:  03/21/2012 PROCEDURE:   Colonoscopy, diagnostic ASA CLASS:   Class II INDICATIONS:an abnormal CT and lower abdominal pain MEDICATIONS: MAC sedation, administered by CRNA and propofol (Diprivan) 300mg  IV DESCRIPTION OF PROCEDURE:   After the risks benefits and alternatives of the procedure were thoroughly explained, informed consent was obtained.  A digital rectal exam revealed no abnormalities of the rectum.   The LB [id number]  endoscope was introduced through the anus and advanced to the terminal ileum which was intubated for a short distance. No adverse events experienced.   The quality of the prep was good, using MoviPrep The instrument was then slowly withdrawn as the colon was fully examined.  COLON FINDINGS: A normal appearing cecum, ileocecal valve, and appendiceal orifice were identified.  The ascending, hepatic flexure, transverse, splenic flexure, descending, sigmoid colon and rectum appeared unremarkable.  No polyps or cancers were seen. The mucosa appeared normal in the terminal ileum. Retroflexed views revealed small internal hemorrhoids. The time to cecum=2 minutes 10 seconds.  Withdrawal time=8 minutes 33 seconds.  The scope was withdrawn and the procedure completed.  COMPLICATIONS: There were no complications.  ENDOSCOPIC IMPRESSION: 1.   Normal colon 2.   Normal mucosa in the terminal ileum 3.   Small internal hemorrhoids  RECOMMENDATIONS: 1.  Continue current colorectal screening recommendations for "routine risk" patients with a repeat colonoscopy in 10 years. 2.  Hyoscyamine 0.125 mg 1-2 po q4h as needed for abdominal pain, with 1 year of refills  eSigned:  Meryl Dare, MD, [facility] 03/21/2012 2:55 PM

## 2012-03-21 NOTE — Patient Instructions (Addendum)
YOU HAD AN ENDOSCOPIC PROCEDURE TODAY AT THE [facility]: Refer to the procedure report that was given to you for any specific questions about what was found during the examination.  If the procedure report does not answer your questions, please call your gastroenterologist to clarify.  If you requested that your care partner not be given the details of your procedure findings, then the procedure report has been included in a sealed envelope for you to review at your convenience later.  YOU SHOULD EXPECT: Some feelings of bloating in the abdomen. Passage of more gas than usual.  Walking can help get rid of the air that was put into your GI tract during the procedure and reduce the bloating. If you had a lower endoscopy (such as a colonoscopy or flexible sigmoidoscopy) you may notice spotting of blood in your stool or on the toilet paper. If you underwent a bowel prep for your procedure, then you may not have a normal bowel movement for a few days.  DIET: Your first meal following the procedure should be a light meal and then it is ok to progress to your normal diet.  A half-sandwich or bowl of soup is an example of a good first meal.  Heavy or fried foods are harder to digest and may make you feel nauseous or bloated.  Likewise meals heavy in dairy and vegetables can cause extra gas to form and this can also increase the bloating.  Drink plenty of fluids but you should avoid alcoholic beverages for 24 hours.  ACTIVITY: Your care partner should take you home directly after the procedure.  You should plan to take it easy, moving slowly for the rest of the day.  You can resume normal activity the day after the procedure however you should NOT DRIVE or use heavy machinery for 24 hours (because of the sedation medicines used during the test).    SYMPTOMS TO REPORT IMMEDIATELY: A gastroenterologist can be reached at any hour.  During normal business hours, 8:30 AM to 5:00 PM Monday through Friday,  call [phone].  After hours and on weekends, please call the GI answering service at [phone] who will take a message and have the physician on call contact you.   Following lower endoscopy (colonoscopy or flexible sigmoidoscopy):  Excessive amounts of blood in the stool  Significant tenderness or worsening of abdominal pains  Swelling of the abdomen that is new, acute  Fever of 100F or higher    FOLLOW UP: If any biopsies were taken you will be contacted by phone or by letter within the next 1-3 weeks.  Call your gastroenterologist if you have not heard about the biopsies in 3 weeks.  Our staff will call the home number listed on your records the next business day following your procedure to check on you and address any questions or concerns that you may have at that time regarding the information given to you following your procedure. This is a courtesy call and so if there is no answer at the home number and we have not heard from you through the emergency physician on call, we will assume that you have returned to your regular daily activities without incident.  SIGNATURES/CONFIDENTIALITY: You and/or your care partner have signed paperwork which will be entered into your electronic medical record.  These signatures attest to the fact that that the information above on your After Visit Summary has been reviewed and is understood.  Full responsibility of the confidentiality   of this discharge information lies with you and/or your care-partner.    Information on hemorrhoids given to you today  Hyoscyamine 0.125mg  1-2 po every 4 hrs as needed for abdominal pain sent to your pharmacy

## 2012-03-22 ENCOUNTER — Telehealth: Payer: Self-pay

## 2012-03-22 NOTE — Telephone Encounter (Signed)
Left message

## 2012-06-27 ENCOUNTER — Encounter: Payer: Self-pay | Admitting: Internal Medicine

## 2012-06-27 ENCOUNTER — Ambulatory Visit (INDEPENDENT_AMBULATORY_CARE_PROVIDER_SITE_OTHER): Payer: Medicare Other | Admitting: Internal Medicine

## 2012-06-27 VITALS — BP 148/100 | HR 72 | Temp 97.3°F | Wt 170.0 lb

## 2012-06-27 DIAGNOSIS — M76899 Other specified enthesopathies of unspecified lower limb, excluding foot: Secondary | ICD-10-CM

## 2012-06-27 DIAGNOSIS — M7071 Other bursitis of hip, right hip: Secondary | ICD-10-CM

## 2012-06-27 MED ORDER — METHYLPREDNISOLONE ACETATE 80 MG/ML IJ SUSP
80.0000 mg | Freq: Once | INTRAMUSCULAR | Status: AC
  Administered 2012-06-27: 80 mg via INTRAMUSCULAR

## 2012-06-29 ENCOUNTER — Other Ambulatory Visit: Payer: Self-pay

## 2012-06-29 MED ORDER — METOPROLOL SUCCINATE ER 50 MG PO TB24: 50.0000 mg | ORAL_TABLET | Freq: Two times a day (BID) | ORAL | Status: DC

## 2012-08-18 ENCOUNTER — Telehealth: Payer: Self-pay | Admitting: Gastroenterology

## 2012-08-18 DIAGNOSIS — R109 Unspecified abdominal pain: Secondary | ICD-10-CM

## 2012-08-18 MED ORDER — HYOSCYAMINE SULFATE 0.125 MG SL SUBL: 0.1250 mg | SUBLINGUAL_TABLET | SUBLINGUAL | Status: DC | PRN

## 2012-08-18 NOTE — Telephone Encounter (Signed)
Patient reports "everytime I eat red meat I have terrible abdominal pain and cramping".  Patient has not tried her levsin.  She is advised she can take 1-2 every 4 hours as needed and I have sent her a refill to her pharmacy.  She is advised that she should avoid red meat in the future if this is the response she has to it.

## 2012-08-24 ENCOUNTER — Other Ambulatory Visit: Payer: Self-pay | Admitting: Internal Medicine

## 2012-08-26 ENCOUNTER — Telehealth: Payer: Self-pay | Admitting: Internal Medicine

## 2012-08-26 NOTE — Telephone Encounter (Signed)
We recommend Dr. Genene Churn as a chiropractor on [address] Does she need pain meds? I am assuming whatever was done made her more sore? Please clarify

## 2012-08-26 NOTE — Telephone Encounter (Signed)
Spoke with patient to advise of Dr. Genene Churn name and gave patient address and phone #.  Asked patient if she needs pain meds.  Patient advised that she's taking 800mg  Ibuprofen twice a day for the pain at present.  She has that presently to get her through until she can get in to see Dr. Mayford Knife.  States she just wished she would've listened and NOT gone and seen these people to help...states it messed her up.  Patient notified.

## 2012-09-26 ENCOUNTER — Other Ambulatory Visit: Payer: Self-pay

## 2012-09-26 MED ORDER — IBUPROFEN 800 MG PO TABS: 800.0000 mg | ORAL_TABLET | Freq: Three times a day (TID) | ORAL | Status: DC | PRN

## 2012-09-26 MED ORDER — METOPROLOL SUCCINATE ER 50 MG PO TB24: 50.0000 mg | ORAL_TABLET | Freq: Two times a day (BID) | ORAL | Status: DC

## 2012-12-08 ENCOUNTER — Other Ambulatory Visit: Payer: Self-pay | Admitting: Internal Medicine

## 2012-12-14 ENCOUNTER — Encounter: Payer: Self-pay | Admitting: Internal Medicine

## 2012-12-14 ENCOUNTER — Other Ambulatory Visit: Payer: Self-pay | Admitting: Obstetrics and Gynecology

## 2012-12-14 DIAGNOSIS — Z853 Personal history of malignant neoplasm of breast: Secondary | ICD-10-CM

## 2012-12-14 DIAGNOSIS — R102 Pelvic and perineal pain: Secondary | ICD-10-CM

## 2012-12-15 NOTE — Progress Notes (Signed)
  Subjective:    Patient ID: Sonya Franklin, female    DOB: [birth year], 47 y.o.   MRN: [id number]  HPI Patient is in today complaining of right hip and thigh pain. Denies any injury or over exertion. She has a history of fibromyalgia syndrome and remote history of breast cancer. Last year she had considerable issues with abdominal pain and diarrhea. She was hospitalized at one point and thought to have infectious colitis or perhaps inflammatory bowel disease. Subsequently had colonoscopy by Dr. Russella Dar in January 20 14th which proved to be within normal limits with no evidence of inflammatory bowel disease. Patient enjoys painting.    Review of Systems     Objective:   Physical Exam straight leg raising is negative at 90. Deep tendon reflexes in the right lower extremity are normal. Muscle strength in the right lower extremity normal. Patient is tender over her right lateral trochanter.        Assessment & Plan:  Trochanteric bursitis  Plan: Sterapred DS 10 mg 6 day dosepak. If symptoms persist, refer to orthopedist.

## 2012-12-15 NOTE — Patient Instructions (Addendum)
Take Sterapred dosepak as prescribed.

## 2012-12-16 ENCOUNTER — Ambulatory Visit
Admission: RE | Admit: 2012-12-16 | Discharge: 2012-12-16 | Disposition: A | Discharge status: Home / Self Care | Payer: Medicare Other | Source: Ambulatory Visit | Attending: Obstetrics and Gynecology | Admitting: Obstetrics and Gynecology

## 2012-12-16 DIAGNOSIS — R102 Pelvic and perineal pain unspecified side: Secondary | ICD-10-CM

## 2012-12-16 DIAGNOSIS — Z853 Personal history of malignant neoplasm of breast: Secondary | ICD-10-CM

## 2012-12-16 MED ORDER — IOHEXOL 300 MG/ML  SOLN
100.0000 mL | Freq: Once | INTRAMUSCULAR | Status: AC | PRN
  Administered 2012-12-16: 100 mL via INTRAVENOUS

## 2013-01-06 ENCOUNTER — Other Ambulatory Visit: Payer: Self-pay | Admitting: Oncology

## 2013-01-06 ENCOUNTER — Other Ambulatory Visit: Payer: Self-pay | Admitting: Internal Medicine

## 2013-02-20 ENCOUNTER — Encounter: Payer: Self-pay | Admitting: Internal Medicine

## 2013-02-20 ENCOUNTER — Ambulatory Visit (INDEPENDENT_AMBULATORY_CARE_PROVIDER_SITE_OTHER): Payer: Medicare Other | Admitting: Internal Medicine

## 2013-02-20 VITALS — BP 160/110 | HR 96 | Temp 98.4°F | Ht 61.0 in | Wt 173.0 lb

## 2013-02-20 DIAGNOSIS — M459 Ankylosing spondylitis of unspecified sites in spine: Secondary | ICD-10-CM

## 2013-02-20 DIAGNOSIS — M25559 Pain in unspecified hip: Secondary | ICD-10-CM

## 2013-02-20 MED ORDER — METHYLPREDNISOLONE ACETATE 80 MG/ML IJ SUSP
80.0000 mg | Freq: Once | INTRAMUSCULAR | Status: AC
  Administered 2013-02-20: 80 mg via INTRAMUSCULAR

## 2013-02-20 NOTE — Progress Notes (Signed)
   Subjective:    Patient ID: Sonya Franklin, female    DOB: 09-22-1965, 47 y.o.   MRN: [id number]  HPI Patient complaining of severe lower back pain and generalized myalgias. Back pain seems to be worse on the right side radiating into her right lower leg. She has a history of ankylosing spondylitis and fibromyalgia. Has previously seen Dr. Phylliss Bob, rheumatologist. She also has a remote history of breast cancer. Patient denies overexertion causing back pain. Back pain flares up from time to time. Patient says her hip hurts as well. In 2009 she had MRI of right hip showing trochanteric bursitis. SI joints at that time look to be unremarkable.  Dr. Okey Dupre saw her in 2008. At that time she was complaining of aching of her whole body is well. Dr. Okey Dupre nurse practitioner, Filomena Jungling initially saw her in 2003 along with Dr. Phylliss Bob. X-rays showed some early widening of the SI joint was some slight blurring of the left SI joint midportion. Was treated with Vioxx. My records indicate she had a sed rate of 14, uric acid of 5.5, total CK of 60, negative rheumatoid factor, negative ANA, normal CCP in December 2008. Dr. Phylliss Bob did HLA-B27 testing by do not have that result. Notes from Dr. Phylliss Bob in 2005 indicated she had tenderness over her SI joints. One point she was on prednisone 5 mg daily. She also has been tried on Celebrex. There was some discussion about trying her on temporal. Patient complains that prednisone has caused palpitations.  I do see a note indicating patient's HLA-B27 was positive but do not have a lab test it confirms that on paper. In 2003 was given diagnosis of ankylosing spondylitis.  She also has history of mild hyperthyroidism seen by Dr. Lucianne Muss. Has multinodular gland on thyroid scan was normal uptake. This was diagnosed in 2011.  History of right lumpectomy 2007. Had chemotherapy and breast reconstruction. History of hypertension. History of vitamin D deficiency and GE reflux.  Patient says she  is intolerant of Pristiq and Cymbalta. Says shellfish causes anaphylaxis.  There is some remote history of mitral valve prolapse but 2-D echocardiogram done in 2008 by Dr. Elsie Lincoln showed no evidence of mitral valve prolapse.  No family history of rheumatoid disorders.  Dr. Dareen Piano injected her trochanter in 2009.      Review of Systems     Objective:   Physical Exam straight leg raising is negative at 90 bilaterally. Muscle strength is normal in both lower extremities. Internal and external rotation of right hip is negative. No tenderness of SI joints.        Assessment & Plan:  History of ankylosing spondylitis  Diffuse myalgias and arthralgias-? Fibromyalgia  Plan: Refer back to rheumatologist. Patient requests injection of Depo-Medrol IM today rather than oral prednisone which she says she does not tolerate well. Given 80 mg IM Depo-Medrol. Has been taking 1600 mg of Motrin at one time because of severe myalgias. Advised her this was not a good dose. Can take up to 800 mg 3 times daily but must spread dose over 8 hours. Prescription for Norco 5/325 ( #60) 1 by mouth every 12 hours when necessary pain

## 2013-02-20 NOTE — Patient Instructions (Signed)
Appointment to be made with Dr. Dareen Piano, rheumatologist. Depo-Medrol IM given today. Take Norco sparingly for pain. May take ibuprofen 800 mg 3 times daily

## 2013-06-15 DIAGNOSIS — Z0289 Encounter for other administrative examinations: Secondary | ICD-10-CM

## 2013-07-07 ENCOUNTER — Other Ambulatory Visit: Payer: Self-pay | Admitting: Internal Medicine

## 2013-08-16 ENCOUNTER — Other Ambulatory Visit: Payer: Self-pay | Admitting: Internal Medicine

## 2013-10-09 ENCOUNTER — Ambulatory Visit (INDEPENDENT_AMBULATORY_CARE_PROVIDER_SITE_OTHER): Payer: Medicare Other | Admitting: Internal Medicine

## 2013-10-09 ENCOUNTER — Encounter: Payer: Self-pay | Admitting: Internal Medicine

## 2013-10-09 VITALS — BP 174/104 | HR 80 | Temp 98.4°F | Wt 175.0 lb

## 2013-10-09 DIAGNOSIS — M797 Fibromyalgia: Secondary | ICD-10-CM

## 2013-10-09 DIAGNOSIS — M459 Ankylosing spondylitis of unspecified sites in spine: Secondary | ICD-10-CM

## 2013-10-09 DIAGNOSIS — I10 Essential (primary) hypertension: Secondary | ICD-10-CM

## 2013-10-09 DIAGNOSIS — IMO0001 Reserved for inherently not codable concepts without codable children: Secondary | ICD-10-CM

## 2013-10-09 MED ORDER — METHYLPREDNISOLONE ACETATE 80 MG/ML IJ SUSP
80.0000 mg | Freq: Once | INTRAMUSCULAR | Status: AC
  Administered 2013-10-09: 80 mg via INTRAMUSCULAR

## 2013-10-09 MED ORDER — HYDROMORPHONE HCL 2 MG PO TABS
ORAL_TABLET | ORAL | Status: DC
Start: 2013-10-09 — End: 2014-03-21

## 2013-10-09 NOTE — Patient Instructions (Addendum)
Take Dilaudid at bedtime for severe pain #6 tabs given Return next week for BP check. Depomedrol given.

## 2013-10-11 ENCOUNTER — Other Ambulatory Visit: Payer: Self-pay | Admitting: Gastroenterology

## 2013-10-15 DIAGNOSIS — M459 Ankylosing spondylitis of unspecified sites in spine: Secondary | ICD-10-CM | POA: Insufficient documentation

## 2013-10-15 NOTE — Progress Notes (Signed)
   Subjective:    Patient ID: Sonya Franklin, female    DOB: May 12, 1965, 48 y.o.   MRN: [id number]  HPI  Patient with history of ankylosing spondylitis and fibromyalgia syndrome. She sees rheumatologist. Today says she is aching all over and has been aching for several weeks. Says she is very miserable. Less so rheumatologist, Dr. Ouida Sills mid-March 20/15. This was the first time she had been there since 2009. Says she was offered Remicade in 2009 but declined it. Has been taking ibuprofen 800 mg several times daily and has sporadic Depo-Medrol injections. Dr. Justine Null diagnosed her with ankylosing spondylitis in 2003. She was HLA-B27 positive. Has been on permanent disability since 2010. History of breast cancer. History of hypertension. Patient was told that options at this point are biological agents. Apparently with her current Medicare plan these will not be reimbursed. Received a Depo-Medrol injection by rheumatologist in March. Blood pressure is elevated today at 174/104 presumably due to pain.    Review of Systems     Objective:   Physical Exam  She is walking stiffly and moving slowly. Has pain over her SI joints bilaterally. Complaining of pain in her upper arms.      Assessment & Plan:  Ankylosing spondylitis  Fibromyalgia  Hypertension-blood pressure elevated today presumably due to pain  Plan: Depo-Medrol 80 mg IM given today. Based return for blood pressure check when pain is under better control in the next few days. Gave her 6x2 mg  Dilaudid tabs for pain

## 2013-10-18 ENCOUNTER — Ambulatory Visit (INDEPENDENT_AMBULATORY_CARE_PROVIDER_SITE_OTHER): Payer: Medicare Other | Admitting: Internal Medicine

## 2013-10-18 ENCOUNTER — Encounter: Payer: Self-pay | Admitting: Internal Medicine

## 2013-10-18 VITALS — BP 150/100 | HR 80 | Wt 175.0 lb

## 2013-10-18 DIAGNOSIS — Z853 Personal history of malignant neoplasm of breast: Secondary | ICD-10-CM

## 2013-10-18 DIAGNOSIS — M459 Ankylosing spondylitis of unspecified sites in spine: Secondary | ICD-10-CM

## 2013-10-18 DIAGNOSIS — I10 Essential (primary) hypertension: Secondary | ICD-10-CM

## 2013-10-18 DIAGNOSIS — IMO0001 Reserved for inherently not codable concepts without codable children: Secondary | ICD-10-CM

## 2013-10-18 DIAGNOSIS — H6123 Impacted cerumen, bilateral: Secondary | ICD-10-CM

## 2013-10-18 DIAGNOSIS — M797 Fibromyalgia: Secondary | ICD-10-CM

## 2013-10-18 DIAGNOSIS — H6120 Impacted cerumen, unspecified ear: Secondary | ICD-10-CM

## 2013-10-18 MED ORDER — AMLODIPINE BESYLATE 5 MG PO TABS: 5.0000 mg | ORAL_TABLET | Freq: Every day | ORAL | Status: DC

## 2013-10-18 NOTE — Progress Notes (Signed)
   Subjective:    Patient ID: Sonya Franklin, female    DOB: 07/01/1965, 48 y.o.   MRN: [id number]  HPI  In today to followup on hypertension. Says Depo-Medrol given for ankylosing spondylitis and fibromyalgia at her request caused some palpitations. Feeling some better but not 100% better with musculoskeletal pain. Also complaining of some ear stuffiness.    Review of Systems     Objective:   Physical Exam  She has cerumen in both TMs. Blood pressure 150/100      Assessment & Plan:  Hypertension-add amlodipine 5 mg daily to current regimen and recheck in 3 weeks  Cerumen in the ears refer to ENT for removal  Musculoskeletal pain due to fibromyalgia and ankylosing spondylitis-trial of prednisone 10 mg daily for 2 weeks. Written prescription for #30 tablets with one refill

## 2013-10-18 NOTE — Patient Instructions (Addendum)
And amlodipine 5 mg daily 2 blood pressure regimen. Return in 3 weeks. Try prednisone 10 mg daily for fibromyalgia and ankylosing spondylitis for 2 weeks. ENT appointment to remove cerumen from the ears

## 2013-10-30 ENCOUNTER — Other Ambulatory Visit: Payer: Self-pay | Admitting: Internal Medicine

## 2013-11-09 ENCOUNTER — Ambulatory Visit: Payer: Medicare Other | Admitting: Internal Medicine

## 2013-11-13 ENCOUNTER — Encounter: Payer: Self-pay | Admitting: Internal Medicine

## 2013-11-13 ENCOUNTER — Ambulatory Visit (INDEPENDENT_AMBULATORY_CARE_PROVIDER_SITE_OTHER): Payer: Medicare Other | Admitting: Internal Medicine

## 2013-11-13 VITALS — BP 120/80 | HR 70 | Wt 174.0 lb

## 2013-11-13 DIAGNOSIS — M797 Fibromyalgia: Secondary | ICD-10-CM

## 2013-11-13 DIAGNOSIS — M459 Ankylosing spondylitis of unspecified sites in spine: Secondary | ICD-10-CM

## 2013-11-13 DIAGNOSIS — I10 Essential (primary) hypertension: Secondary | ICD-10-CM

## 2013-11-13 DIAGNOSIS — IMO0001 Reserved for inherently not codable concepts without codable children: Secondary | ICD-10-CM

## 2013-11-13 MED ORDER — PREDNISONE 5 MG PO TABS: 5.0000 mg | ORAL_TABLET | Freq: Every day | ORAL | Status: DC

## 2013-11-13 MED ORDER — OLOPATADINE HCL 0.1 % OP SOLN: 1.0000 [drp] | Freq: Two times a day (BID) | OPHTHALMIC | Status: DC

## 2013-11-13 NOTE — Patient Instructions (Addendum)
Increase prednisone to 10 mg daily. Continue same BP meds. Physical exam scheduled for late September.

## 2013-11-17 ENCOUNTER — Other Ambulatory Visit: Payer: Medicare Other | Admitting: Internal Medicine

## 2013-11-17 DIAGNOSIS — E0590 Thyrotoxicosis, unspecified without thyrotoxic crisis or storm: Secondary | ICD-10-CM

## 2013-11-17 DIAGNOSIS — R5381 Other malaise: Secondary | ICD-10-CM

## 2013-11-17 DIAGNOSIS — R5383 Other fatigue: Secondary | ICD-10-CM

## 2013-11-17 DIAGNOSIS — Z13220 Encounter for screening for lipoid disorders: Secondary | ICD-10-CM

## 2013-11-17 DIAGNOSIS — I10 Essential (primary) hypertension: Secondary | ICD-10-CM

## 2013-11-17 DIAGNOSIS — E559 Vitamin D deficiency, unspecified: Secondary | ICD-10-CM

## 2013-11-17 DIAGNOSIS — Z0000 Encounter for general adult medical examination without abnormal findings: Secondary | ICD-10-CM

## 2013-11-17 LAB — CBC WITH DIFFERENTIAL/PLATELET
Basophils Absolute: 0.1 10*3/uL (ref 0.0–0.1)
Basophils Relative: 1 % (ref 0–1)
Eosinophils Absolute: 0.1 10*3/uL (ref 0.0–0.7)
Eosinophils Relative: 2 % (ref 0–5)
HCT: 36.7 % (ref 36.0–46.0)
Hemoglobin: 12.4 g/dL (ref 12.0–15.0)
Lymphocytes Relative: 27 % (ref 12–46)
Lymphs Abs: 1.8 10*3/uL (ref 0.7–4.0)
MCH: 28.1 pg (ref 26.0–34.0)
MCHC: 33.8 g/dL (ref 30.0–36.0)
MCV: 83 fL (ref 78.0–100.0)
Monocytes Absolute: 0.4 10*3/uL (ref 0.1–1.0)
Monocytes Relative: 6 % (ref 3–12)
Neutro Abs: 4.2 10*3/uL (ref 1.7–7.7)
Neutrophils Relative %: 64 % (ref 43–77)
Platelets: 281 10*3/uL (ref 150–400)
RBC: 4.42 MIL/uL (ref 3.87–5.11)
RDW: 14.1 % (ref 11.5–15.5)
WBC: 6.5 10*3/uL (ref 4.0–10.5)

## 2013-11-17 LAB — LIPID PANEL
Cholesterol: 219 mg/dL — ABNORMAL HIGH (ref 0–200)
HDL: 92 mg/dL (ref >39)
LDL Cholesterol: 115 mg/dL — ABNORMAL HIGH (ref 0–99)
Total CHOL/HDL Ratio: 2.4 Ratio
Triglycerides: 62 mg/dL (ref <150)
VLDL: 12 mg/dL (ref 0–40)

## 2013-11-17 LAB — COMPREHENSIVE METABOLIC PANEL WITH GFR
ALT: 17 U/L (ref 0–35)
AST: 15 U/L (ref 0–37)
Albumin: 4.3 g/dL (ref 3.5–5.2)
Alkaline Phosphatase: 83 U/L (ref 39–117)
BUN: 10 mg/dL (ref 6–23)
CO2: 27 meq/L (ref 19–32)
Calcium: 9.6 mg/dL (ref 8.4–10.5)
Chloride: 104 meq/L (ref 96–112)
Creat: 0.6 mg/dL (ref 0.50–1.10)
Glucose, Bld: 95 mg/dL (ref 70–99)
Potassium: 3.8 meq/L (ref 3.5–5.3)
Sodium: 140 meq/L (ref 135–145)
Total Bilirubin: 0.5 mg/dL (ref 0.2–1.2)
Total Protein: 7.6 g/dL (ref 6.0–8.3)

## 2013-11-17 LAB — TSH: TSH: 0.3 u[IU]/mL — AB (ref 0.350–4.500)

## 2013-11-18 ENCOUNTER — Other Ambulatory Visit: Payer: Self-pay | Admitting: Gastroenterology

## 2013-11-18 LAB — VITAMIN D 25 HYDROXY (VIT D DEFICIENCY, FRACTURES): Vit D, 25-Hydroxy: 22 ng/mL — ABNORMAL LOW (ref 30–89)

## 2013-12-08 ENCOUNTER — Encounter: Payer: Self-pay | Admitting: Internal Medicine

## 2013-12-08 NOTE — Progress Notes (Signed)
   Subjective:    Patient ID: Sonya Franklin, female    DOB: 1965-08-12, 48 y.o.   MRN: [id number]  HPI  In today to follow up on elevated blood pressure. Is in August she is amlodipine 5 mg daily was added to her blood pressure regimen. Blood pressure is now excellent at 120/80. She still has musculoskeletal pain but seems a bit less than previous visit. She has been on prednisone recently for rheumatology issues.    Review of Systems     Objective:   Physical Exam  Chest clear. Cardiac exam regular rate and rhythm. Extremities without edema. Skin warm and dry. Tender over her SI joint with history of ankylosing spondylitis      Assessment & Plan:  Hypertension-better controlled with adding amlodipine  Fibromyalgia  Ankylosing spondylitis  Plan: Have physical exam late September. Take prednisone to 10 mg daily to see if pain improves.

## 2013-12-11 ENCOUNTER — Encounter: Payer: Medicare Other | Admitting: Internal Medicine

## 2014-01-02 ENCOUNTER — Telehealth: Payer: Self-pay | Admitting: Internal Medicine

## 2014-01-02 DIAGNOSIS — R1084 Generalized abdominal pain: Secondary | ICD-10-CM

## 2014-01-02 MED ORDER — HYOSCYAMINE SULFATE 0.125 MG SL SUBL: 0.1250 mg | SUBLINGUAL_TABLET | SUBLINGUAL | Status: DC | PRN

## 2014-01-02 NOTE — Telephone Encounter (Signed)
Please refill.

## 2014-01-02 NOTE — Telephone Encounter (Signed)
Pt would like Dr. Renold Genta to refill hyoscyamine (LEVSIN SL) 0.125 MG SL tablet [[id number]].  States she is having problems with her stomach and just wants the medication.   [facility] [address] [place]  Best number to call pt is [phone]

## 2014-01-04 ENCOUNTER — Other Ambulatory Visit: Payer: Self-pay | Admitting: Internal Medicine

## 2014-01-10 ENCOUNTER — Other Ambulatory Visit: Payer: Self-pay | Admitting: Obstetrics and Gynecology

## 2014-01-11 LAB — CYTOLOGY - PAP

## 2014-01-17 ENCOUNTER — Other Ambulatory Visit: Payer: Self-pay | Admitting: Internal Medicine

## 2014-02-12 ENCOUNTER — Encounter: Payer: Self-pay | Admitting: Internal Medicine

## 2014-02-12 ENCOUNTER — Ambulatory Visit (INDEPENDENT_AMBULATORY_CARE_PROVIDER_SITE_OTHER): Payer: Medicare Other | Admitting: Internal Medicine

## 2014-02-12 VITALS — BP 122/84 | HR 66 | Temp 97.8°F | Ht 61.0 in | Wt 178.5 lb

## 2014-02-12 DIAGNOSIS — M459 Ankylosing spondylitis of unspecified sites in spine: Secondary | ICD-10-CM

## 2014-02-12 DIAGNOSIS — M797 Fibromyalgia: Secondary | ICD-10-CM

## 2014-02-12 DIAGNOSIS — Z853 Personal history of malignant neoplasm of breast: Secondary | ICD-10-CM

## 2014-02-12 DIAGNOSIS — Z0000 Encounter for general adult medical examination without abnormal findings: Secondary | ICD-10-CM

## 2014-02-12 DIAGNOSIS — J069 Acute upper respiratory infection, unspecified: Secondary | ICD-10-CM

## 2014-02-12 DIAGNOSIS — E7800 Pure hypercholesterolemia, unspecified: Secondary | ICD-10-CM

## 2014-02-12 DIAGNOSIS — R0683 Snoring: Secondary | ICD-10-CM

## 2014-02-12 LAB — POCT URINALYSIS DIPSTICK
BILIRUBIN UA: NEGATIVE
Glucose, UA: NEGATIVE
Glucose, UA: NEGATIVE
KETONES UA: NEGATIVE
LEUKOCYTES UA: NEGATIVE
Leukocytes, UA: NEGATIVE
NITRITE UA: NEGATIVE
Nitrite, UA: NEGATIVE
Spec Grav, UA: 1.025
Spec Grav, UA: 1.025
UROBILINOGEN UA: 0.2
Urobilinogen, UA: NEGATIVE
pH, UA: 6
pH, UA: 6

## 2014-02-12 MED ORDER — METHYLPREDNISOLONE ACETATE 80 MG/ML IJ SUSP
80.0000 mg | Freq: Once | INTRAMUSCULAR | Status: AC
  Administered 2014-02-12: 80 mg via INTRAMUSCULAR

## 2014-02-12 MED ORDER — AZITHROMYCIN 250 MG PO TABS: ORAL_TABLET | ORAL | Status: DC

## 2014-02-12 NOTE — Progress Notes (Signed)
Subjective:    Patient ID: Sonya Franklin, female    DOB: 1966-01-02, 48 y.o.   MRN: [id number]  HPI 48 year old Black Female in today for health maintenance exam and evaluation of medical issues. She is on disability with history of fibromyalgia syndrome, breast cancer, chronic musculoskeletal pain, ankylosing spondylitis.  She has a history of low TSH levels followed by Dr. Dwyane Dee. Is thought to have multinodular goiter and not true hyperthyroidism. History of inflammatory process of the small bowel on CT scan after presenting to the emergency department in 2013 with acute abdominal pain. History of colonoscopy and endoscopy by Dr. Fuller Plan in 1999 showing mild esophagitis. Was admitted to hospital in November 2013 with abdominal pain. Stool for C. difficile and culture were negative. She was treated with Cipro and Flagyl and improved. She was complaining of recurrent headaches and a CT of the brain was negative. She has no bright red blood per rectum or diarrhea just some vague abdominal pain. Generally abdominal pain response to steroids. Colonoscopy by Dr. Fuller Plan in 2014 was normal.  Recent lab work shows an LDL cholesterol of  115; HDL cholesterol of 92, and total cholesterol of 219 with normal triglycerides. CBC was normal. TSH was low at 0.300. Vitamin D level was low at 22. Liver functions were normal. BUN and creatinine were normal. Glucose was normal.  New problem today is an acute upper respiratory infection. No fever or shaking chills.   Never feels well because of chronic musculoskeletal pain. Decreased energy. This is long-standing.  She had negative Cardiolite study in October 2007. 2-D echo 2008 was negative. Carotid Dopplers 2006 negative.  Total hysterectomy 2008. Right breast cancer 2007. 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 She had adjuvant chemotherapy consisting of 4 cycles Cytoxan and doxorubicin then weekly Taxol 4.  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 She had bilateral mastectomies November 2007 with no evidence of residual disease. She started tamoxifen in January 2008. 2008 had breast reconstruction.  Had biopsy of complex thyroid nodule right inferior lobe November 2006 which showed benign colloid nodule.  In 2009 had nerve conduction studies because of right upper extremity pain. These were normal.  History of trochanteric bursitis right hip.  Used to see Dr. Justine Null, rheumatologist for ankylosing spondylitis. Was on low-dose prednisone 5 mg daily for a period of time. Was subsequently changed to Celebrex around 2005. Now followed by Dr. Ouida Sills. In 2009 she had injection of right trochanteric bursa by Dr. Ouida Sills.  Social history: She is divorced. Has a disabled son born in 73. 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 She receives disability. Previously worked in Pension scheme manager at [employer]. Does not smoke or consume alcohol.  Family history: History of goiter and diabetes as well as hypertension in mother. Father with history of diabetes and hypertension as well as gout.  Patient indicates she is allergic to Aleve and shellfish.  Review of Systems  Constitutional: Positive for fatigue.  Respiratory:       Complains of loud snoring  Gastrointestinal:       No recent episodes of abdominal pain  Musculoskeletal:       Chronic musculoskeletal pain and fatigue  Psychiatric/Behavioral: Negative.        Objective:   Physical Exam  Constitutional: She is oriented to person, place, and time. She appears well-developed and well-nourished. No distress.  HENT:  Head: Normocephalic and atraumatic.  Right Ear: External ear normal.  Mouth/Throat: Oropharynx is clear and moist. No oropharyngeal exudate.  Eyes: Conjunctivae are normal. Pupils are equal, round, and reactive to light. Right eye exhibits no discharge. Left eye exhibits no  discharge. No scleral icterus.  Neck: Neck supple. No JVD present. No thyromegaly present.  Cardiovascular: Normal rate, normal heart sounds and intact distal pulses.   No murmur heard. Pulmonary/Chest: Effort normal and breath sounds normal. She has no wheezes. She has no rales.  Bilateral mastectomies with reconstruction   Abdominal: Soft. Bowel sounds are normal. She exhibits no distension and no mass. There is no tenderness. There is no rebound and no guarding.  Genitourinary:  Deferred to Dr. Melinda Crutch. Had Pap smear late October which was normal  Musculoskeletal: Normal range of motion. She exhibits no edema.  Lymphadenopathy:    She has no cervical adenopathy.  Neurological: She is alert and oriented to person, place, and time. She has normal reflexes. No cranial nerve deficit. Coordination normal.  Skin: Skin is warm and dry. No rash noted. She is not diaphoretic.  Psychiatric: She has a normal mood and affect. Her behavior is normal. Judgment and thought content normal.  Vitals reviewed.         Assessment & Plan:  History of ankylosing spondylitis  ? Fibromyalgia-has chronic musculoskeletal pain  History of right trochanteric bursitis  Hypertension-stable  History of right breast cancer  GE reflux  Anxiety  Hyperthyroidism-history of low TSH has seen Dr. Dwyane Dee in the past with history of goiter  History of vitamin D deficiency  Snoring-may have sleep apnea-consider sleep study    Acute URI  Plan: Depomedrol 80 mg IM. Zithromax 2 po today then one po days 2-5. Continue same medications. Watch diet. Return in 6 months.  Subjective:   Patient presents for Medicare Annual/Subsequent preventive examination.  Review Past Medical/Family/Social:see above   Risk Factors  Current exercise habits: Sedentary Dietary issues discussed: Low fat low carbohydrate discussed  Cardiac risk factors: Hyperlipidemia  Depression Screen  (Note: if answer to either of  the following is "Yes", a more complete depression screening is indicated)   Over the past two weeks, have you felt down, depressed or hopeless? No  Over the past two weeks, have you felt little interest or pleasure in doing things? No Have you lost interest or pleasure in daily life? No Do you often feel hopeless? No Do you cry easily over simple problems? No   Activities of Daily Living  In your present state of health, do you have any difficulty performing the following activities?:   Driving? No  Managing money? No  Feeding yourself? No  Getting from bed to chair? Yes because of musculoskeletal pain Climbing a flight of stairs? Yes because of musculoskeletal pain Preparing food and eating?: Yes because of musculoskeletal pain Bathing or showering? No  Getting dressed: No  Getting to the toilet? No  Using the toilet:No  Moving around from place to place: He has because of musculoskeletal pain  No recent falls  Are you sexually active? yes Do you have more than one partner? No   Hearing Difficulties: No  Do you often ask people to speak up or repeat themselves? No  Do you experience ringing or noises in your ears? Yes Do you have difficulty understanding soft or whispered voices? No  Do you feel that you have a problem with memory? No Do you often misplace items? Often   Home  Safety:  Do you have a smoke alarm at your residence? Yes Do you have grab bars in the bathroom? No Do you have throw rugs in your house? Yes   Cognitive Testing  Alert? Yes Normal Appearance?Yes  Oriented to person? Yes Place? Yes  Time? Yes  Recall of three objects? Yes  Can perform simple calculations? Yes  Displays appropriate judgment?Yes  Can read the correct time from a watch face?Yes   List the Names of Other Physician/Practitioners you currently use:  See referral list for the physicians patient is currently seeing.   Rheumatologist  Review of Systems: See above  Objective:      General appearance: Appears stated age  Head: Normocephalic, without obvious abnormality, atraumatic  Eyes: conj clear, EOMi PEERLA  Ears: normal TM's and external ear canals both ears  Nose: Nares normal. Septum midline. Mucosa normal. No drainage or sinus tenderness.  Throat: lips, mucosa, and tongue normal; teeth and gums normal  Neck: no adenopathy, no carotid bruit, no JVD, supple, symmetrical, trachea midline and thyroid not enlarged, symmetric, no tenderness/mass/nodules  No CVA tenderness.  Lungs: clear to auscultation bilaterally  Breasts: normal appearance, no masses or tenderness, Heart: regular rate and rhythm, S1, S2 normal, no murmur, click, rub or gallop  Abdomen: soft, non-tender; bowel sounds normal; no masses, no organomegaly  Musculoskeletal: ROM normal in all joints, no crepitus, no deformity, Normal muscle strengthen. Back  is symmetric, no curvature. Skin: Skin color, texture, turgor normal. No rashes or lesions  Lymph nodes: Cervical, supraclavicular, and axillary nodes normal.  Neurologic: CN 2 -12 Normal, Normal symmetric reflexes. Normal coordination and gait  Psych: Alert & Oriented x 3, Mood appear stable.    Assessment:    Annual wellness medicare exam   Plan:    During the course of the visit the patient was educated and counseled about appropriate screening and preventive services including:  Annual mammogram  Had colonoscopy 2014      Patient Instructions (the written plan) was given to the patient.  Medicare Attestation  I have personally reviewed:  The patient's medical and social history  Their use of alcohol, tobacco or illicit drugs  Their current medications and supplements  The patient's functional ability including ADLs,fall risks, home safety risks, cognitive, and hearing and visual impairment  Diet and physical activities  Evidence for depression or mood disorders  The patient's weight, height, BMI, and visual acuity have been  recorded in the chart. I have made referrals, counseling, and provided education to the patient based on review of the above and I have provided the patient with a written personalized care plan for preventive services.

## 2014-03-21 ENCOUNTER — Encounter: Payer: Self-pay | Admitting: Neurology

## 2014-03-21 ENCOUNTER — Ambulatory Visit (INDEPENDENT_AMBULATORY_CARE_PROVIDER_SITE_OTHER): Payer: 59 | Admitting: Neurology

## 2014-03-21 VITALS — BP 134/85 | HR 76 | Resp 14 | Ht 64.75 in | Wt 179.0 lb

## 2014-03-21 DIAGNOSIS — R0683 Snoring: Secondary | ICD-10-CM | POA: Diagnosis not present

## 2014-03-21 DIAGNOSIS — M4815 Ankylosing hyperostosis [Forestier], thoracolumbar region: Secondary | ICD-10-CM

## 2014-03-21 DIAGNOSIS — G620 Drug-induced polyneuropathy: Secondary | ICD-10-CM

## 2014-03-21 DIAGNOSIS — T451X5A Adverse effect of antineoplastic and immunosuppressive drugs, initial encounter: Secondary | ICD-10-CM

## 2014-03-21 HISTORY — DX: Snoring: R06.83

## 2014-03-21 MED ORDER — GABAPENTIN 300 MG PO CAPS: 300.0000 mg | ORAL_CAPSULE | Freq: Every day | ORAL | Status: DC

## 2014-03-21 NOTE — Progress Notes (Signed)
SLEEP MED[facility]INE CLINIC   Provider:  Larey Seat, M D  Referring Provider: Elby Showers, MD Primary Care Physician:  Elby Showers, MD  Chief Complaint  Patient presents with  . NP Sleep Consult    Rm 11, alone    HPI:  Sonya Franklin is a 49 y.o. female seen here as a referral  from Dr. Renold Genta for a sleep evaluation,  Sonya Franklin is seen here today describing that she has been snoring very loudly and would like to be evaluated for this. Mrs. Crill suffered from breast cancer was diagnosed in 2007 and underwent bilateral mastectomies. After the surgery he is her sleep pattern change to some degree first of all it was normal longer comfortable to sleep problem and she has developed in the meantime ankylosing spondylitis which makes it harder to sleep on the side or on the back. She is more restless finding a comfortable position or trying to find one. She may also have gained some weight over the last 7 years. She does not suffer from diabetes, hypercholesterolemia, thyroid disease, but she has  high blood pressure.  The patient goes to sleep around 10.30 Pm and is promptly falling asleep.  She sleeps usually until 4 AM, when she wakes up but doesn't know why and she needs time to go back to sleep. No nocturia. She feels tired after she finally rises. Rises at 9 Am and feels tired, achy and stiff . She noted crepitation in the knees and neck.  She has a lingering headache in the morning,   Dr. Ernesto Rutherford performed a sinus surgery, removed a polyp. She had not noticed a change in snoring.    Review of Systems: Out of a complete 14 system review, the patient complains of only the following symptoms, and all other reviewed systems are negative.   Epworth score 21, Fatigue severity score 21 , depression score 2,    History   Social History  . Marital Status: Legally Separated    Spouse Name: N/A    Number of Children: 1  . Years of Education: N/A   Occupational History  .      Social History Main Topics  . Smoking status: Never Smoker   . Smokeless tobacco: Never Used  . Alcohol Use: No  . Drug Use: No  . Sexual Activity: Yes   Other Topics Concern  . Not on file   Social History Narrative   Caffeine occasional drinker.   Not working/disabled.  12th grader. Divorced, one kids.     Family History  Problem Relation Age of Onset  . Breast cancer Maternal Aunt   . Colitis Brother   . Diabetes Mother   . Hypertension Mother   . Hyperlipidemia Mother     Past Medical History  Diagnosis Date  . Hypertension   . Ankylosing spondylitis   . Bursitis of hip, right   . GE reflux   . Vitamin D deficiency   . Anxiety   . Hyperthyroidism   . Hiatal hernia   . Ischemic bowel disease   . Cardiac arrhythmia   . Arthritis   . Breast cancer     right breast  . Snoring 03/21/2014    Past Surgical History  Procedure Laterality Date  . Breast lumpectomy      right  . Abdominal hysterectomy    . Mastectomy      double    Current Outpatient Prescriptions  Medication Sig Dispense Refill  . amLODipine (  NORVASC) 5 MG tablet TAKE 1 TABLET (5 MG TOTAL) BY MOUTH DAILY. 30 tablet 5  . hyoscyamine (LEVSIN SL) 0.125 MG SL tablet Place 1 tablet (0.125 mg total) under the tongue every 4 (four) hours as needed for cramping (abdominal pain). 90 tablet 6  . ibuprofen (ADVIL,MOTRIN) 800 MG tablet TAKE 1 TABLET (800 MG TOTAL) BY MOUTH EVERY 8 (EIGHT) HOURS AS NEEDED FOR PAIN. 180 tablet 3  . lisinopril (PRINIVIL,ZESTRIL) 20 MG tablet TAKE 1 TABLET BY MOUTH DAILY 30 tablet 5  . metoprolol succinate (TOPROL-XL) 50 MG 24 hr tablet TAKE 1 TABLET (50 MG TOTAL) BY MOUTH 2 (TWO) TIMES DAILY. 180 tablet 2  . olopatadine (PATANOL) 0.1 % ophthalmic solution Place 1 drop into both eyes 2 (two) times daily. 5 mL 12  . azithromycin (ZITHROMAX) 250 MG tablet   0  . gabapentin (NEURONTIN) 300 MG capsule Take 1 capsule (300 mg total) by mouth at bedtime. 90 capsule 3   No current  facility-administered medications for this visit.    Allergies as of 03/21/2014 - Review Complete 03/21/2014  Allergen Reaction Noted  . Shellfish allergy Anaphylaxis 09/02/2010  . Blue dyes (parenteral)  09/02/2010    Vitals: BP 134/85 mmHg  Pulse 76  Resp 14  Ht 5' 4.75" (1.645 m)  Wt 179 lb (81.194 kg)  BMI 30.00 kg/m2 Last Weight:  Wt Readings from Last 1 Encounters:  03/21/14 179 lb (81.194 kg)       Last Height:   Ht Readings from Last 1 Encounters:  03/21/14 5' 4.75" (1.645 m)    Physical exam:  General: The patient is awake, alert and appears not in acute distress. The patient is well groomed. Head: Normocephalic, atraumatic. Neck is supple. Mallampati 3 ,  neck circumference: 15.5 . Nasal airflow unrestrcited . Retrognathia is seen.  Cardiovascular:  Regular rate and rhythm , without  murmurs or carotid bruit, and without distended neck veins. Respiratory: Lungs are clear to auscultation. Skin:  Without evidence of edema, or rash Trunk: BMI is elevated and patien  has normal posture.  Neurologic exam : The patient is awake and alert, oriented to place and time.   Memory subjective described as intact.  There is a normal attention span & concentration ability. Speech is fluent without dysarthria, dysphonia or aphasia. Mood and affect are appropriate.  Cranial nerves: Pupils are equal and briskly reactive to light.  Funduscopic exam without evidence of pallor or edema. Dry eyes.  Extraocular movements  in vertical and horizontal planes intact and without nystagmus. Visual fields by finger perimetry are intact. Hearing to finger rub intact.  Facial sensation intact to fine touch. Facial motor strength is symmetric and tongue and uvula move midline. Tongue protrusion into the cheeks is equal.   Motor exam:  Normal tone, muscle bulk and symmetric, strength in all extremities. She reports pain and stiffness in all joints.  Sensory:  Fine touch, pinprick and vibration  were tested in all extremities.  Proprioception is impaired, reflected in her impaired finger to nose test and  wider based gait.  Coordination: Rapid alternating movements in the fingers/hands is normal. Finger-to-nose maneuver  normal without evidence of ataxia, dysmetria or tremor.  Gait and station: Patient walks without assistive device and is able unassisted to climb up to the exam table. Strength within normal limits. Stance is  Wider based , but stable and gait is a little wobbly normal. Tandem gait is fragmented. Romberg testing is negative.  Deep tendon reflexes: in  the upper and lower extremities are symmetric and intact.  Babinski maneuver response is downgoing.   Assessment:  After physical and neurologic examination, review of laboratory studies, imaging, neurophysiology testing and pre-existing records, assessment is  1) obesity, retrognathia and snoring. This patient wakes with morning headaches - order SPLIT with CO2.  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 Dry eyes.  3) gait abnormality and propioception impairment, will start neurontin 300 mg at night po.    The patient was advised of the nature of the diagnosed sleep disorder, the treatment options and risks for general a health and wellness arising from not treating the condition. Visit duration was 45 minutes.   Plan:  Treatment plan and additional workup :  SPLIT, get Co2,  Neuropathy induced gait disorder  : may need additional fall prevention excercises. 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      Asencion Partridge Sayid Moll MD  03/21/2014

## 2014-03-21 NOTE — Patient Instructions (Signed)
Place sleep apnea patient instructions here.

## 2014-04-03 DIAGNOSIS — M9906 Segmental and somatic dysfunction of lower extremity: Secondary | ICD-10-CM | POA: Diagnosis not present

## 2014-04-03 DIAGNOSIS — M9901 Segmental and somatic dysfunction of cervical region: Secondary | ICD-10-CM | POA: Diagnosis not present

## 2014-04-03 DIAGNOSIS — M9902 Segmental and somatic dysfunction of thoracic region: Secondary | ICD-10-CM | POA: Diagnosis not present

## 2014-04-03 DIAGNOSIS — M9905 Segmental and somatic dysfunction of pelvic region: Secondary | ICD-10-CM | POA: Diagnosis not present

## 2014-04-03 DIAGNOSIS — M9903 Segmental and somatic dysfunction of lumbar region: Secondary | ICD-10-CM | POA: Diagnosis not present

## 2014-04-19 DIAGNOSIS — M9901 Segmental and somatic dysfunction of cervical region: Secondary | ICD-10-CM | POA: Diagnosis not present

## 2014-04-19 DIAGNOSIS — M9902 Segmental and somatic dysfunction of thoracic region: Secondary | ICD-10-CM | POA: Diagnosis not present

## 2014-04-19 DIAGNOSIS — M9903 Segmental and somatic dysfunction of lumbar region: Secondary | ICD-10-CM | POA: Diagnosis not present

## 2014-04-19 DIAGNOSIS — M450 Ankylosing spondylitis of multiple sites in spine: Secondary | ICD-10-CM | POA: Diagnosis not present

## 2014-04-19 DIAGNOSIS — M9906 Segmental and somatic dysfunction of lower extremity: Secondary | ICD-10-CM | POA: Diagnosis not present

## 2014-05-01 ENCOUNTER — Ambulatory Visit (INDEPENDENT_AMBULATORY_CARE_PROVIDER_SITE_OTHER): Payer: 59 | Admitting: Neurology

## 2014-05-01 DIAGNOSIS — G4730 Sleep apnea, unspecified: Secondary | ICD-10-CM

## 2014-05-01 DIAGNOSIS — T451X5A Adverse effect of antineoplastic and immunosuppressive drugs, initial encounter: Secondary | ICD-10-CM

## 2014-05-01 DIAGNOSIS — G620 Drug-induced polyneuropathy: Secondary | ICD-10-CM

## 2014-05-01 DIAGNOSIS — G4710 Hypersomnia, unspecified: Secondary | ICD-10-CM

## 2014-05-01 DIAGNOSIS — R0683 Snoring: Secondary | ICD-10-CM

## 2014-05-01 DIAGNOSIS — M4815 Ankylosing hyperostosis [Forestier], thoracolumbar region: Secondary | ICD-10-CM

## 2014-05-02 NOTE — Sleep Study (Signed)
Please see the scanned sleep study interpretation located in the Procedure tab within the Chart Review section. 

## 2014-05-08 DIAGNOSIS — T451X5A Adverse effect of antineoplastic and immunosuppressive drugs, initial encounter: Secondary | ICD-10-CM

## 2014-05-08 DIAGNOSIS — G620 Drug-induced polyneuropathy: Secondary | ICD-10-CM | POA: Insufficient documentation

## 2014-05-08 DIAGNOSIS — M4815 Ankylosing hyperostosis [Forestier], thoracolumbar region: Secondary | ICD-10-CM | POA: Insufficient documentation

## 2014-05-09 ENCOUNTER — Encounter: Payer: Self-pay | Admitting: Neurology

## 2014-05-12 NOTE — Patient Instructions (Addendum)
Return in 6 months for office visit ,blood pressure check, lipid panel. Depo-Medrol and Zithromax prescribed today for acute URI. Consider sleep study.

## 2014-05-14 ENCOUNTER — Telehealth: Payer: Self-pay | Admitting: *Deleted

## 2014-05-15 ENCOUNTER — Encounter: Payer: Self-pay | Admitting: *Deleted

## 2014-05-15 ENCOUNTER — Telehealth: Payer: Self-pay | Admitting: *Deleted

## 2014-05-15 NOTE — Telephone Encounter (Signed)
On three different occasions I have contacted the listed number provided and attempted to contact Sonya Franklin to go over the results of her sleep study and refer her to a DME.  Each occasion someone answers with a low tone voice and when I introduce myself and request to speak with the patient someone hangs up the phone.  I have mailed a letter to the listed address requesting patient contact our office.

## 2014-05-18 ENCOUNTER — Other Ambulatory Visit: Payer: Self-pay | Admitting: Neurology

## 2014-05-18 ENCOUNTER — Telehealth: Payer: Self-pay | Admitting: *Deleted

## 2014-05-18 DIAGNOSIS — G4733 Obstructive sleep apnea (adult) (pediatric): Secondary | ICD-10-CM

## 2014-05-18 NOTE — Telephone Encounter (Addendum)
Patient called our office back after numerous attempts to contact her and was provided the results of her split night sleep study.  She provided [place] with her new phone number in order to contact her in the future.  Patient was informed that there was a positive diagnosis for obstructive sleep apnea and that CPAP therapy had been recommended.  Patient was in agreement and was referred to [facility] for CPAP set up.  The patient gave verbal permission to mail a copy of her test results.   Patient instructed to contact our office 6-8 weeks post set up to schedule a follow up appointment.  Dr. Tedra Senegal was provided a copy of the results.

## 2014-05-18 NOTE — Telephone Encounter (Signed)
Patient has evidence of sleep apnea on recent sleep  study. We can set up C Pap consultation with home health agency. Have left patient message on her cell phone.

## 2014-05-21 ENCOUNTER — Telehealth: Payer: Self-pay | Admitting: Internal Medicine

## 2014-05-21 NOTE — Telephone Encounter (Signed)
Patient called regarding her sleep study.  Advised that Dr. Edwena Felty office had called her on 3/4 according to notes in EPIC and provided results of her sleep study and advised her to contact [facility] to be setup for CPAP.  Patient advised she didn't know anything and couldn't remember anything.  So, I read her the results per the message from their phone call to her on 3/4.    Provided the phone # to [facility] to patient as she advised she didn't have their phone #.  Also advised patient that Dr. Brett Fairy wanted to see her back for a f/u appointment as well after she started the CPAP therapy per the 3/4 note.    Patient also has a new phone #.  Updated that in EPIC.  Patient verbalized an understanding of our conversation.

## 2014-05-23 ENCOUNTER — Telehealth: Payer: Self-pay | Admitting: *Deleted

## 2014-05-24 NOTE — Telephone Encounter (Signed)
Patient states she is starting a CPAP it has been set up for her they deliver it tomorrow

## 2014-05-28 ENCOUNTER — Ambulatory Visit (INDEPENDENT_AMBULATORY_CARE_PROVIDER_SITE_OTHER): Payer: Medicare Other | Admitting: Internal Medicine

## 2014-05-28 ENCOUNTER — Encounter: Payer: Self-pay | Admitting: Internal Medicine

## 2014-05-28 VITALS — BP 124/80 | HR 70 | Temp 97.3°F | Wt 179.0 lb

## 2014-05-28 DIAGNOSIS — G4733 Obstructive sleep apnea (adult) (pediatric): Secondary | ICD-10-CM

## 2014-05-28 DIAGNOSIS — E8770 Fluid overload, unspecified: Secondary | ICD-10-CM | POA: Diagnosis not present

## 2014-05-28 DIAGNOSIS — R5383 Other fatigue: Secondary | ICD-10-CM

## 2014-05-28 DIAGNOSIS — M459 Ankylosing spondylitis of unspecified sites in spine: Secondary | ICD-10-CM

## 2014-05-28 DIAGNOSIS — Z131 Encounter for screening for diabetes mellitus: Secondary | ICD-10-CM | POA: Diagnosis not present

## 2014-05-28 DIAGNOSIS — R609 Edema, unspecified: Secondary | ICD-10-CM

## 2014-05-28 LAB — COMPLETE METABOLIC PANEL WITH GFR
ALBUMIN: 4.2 g/dL (ref 3.5–5.2)
ALT: 15 U/L (ref 0–35)
AST: 15 U/L (ref 0–37)
Alkaline Phosphatase: 73 U/L (ref 39–117)
BUN: 17 mg/dL (ref 6–23)
CHLORIDE: 105 meq/L (ref 96–112)
CO2: 25 meq/L (ref 19–32)
Calcium: 9.1 mg/dL (ref 8.4–10.5)
Creat: 0.79 mg/dL (ref 0.50–1.10)
GFR, Est African American: >89 mL/min
GFR, Est Non African American: 88 mL/min
GLUCOSE: 93 mg/dL (ref 70–99)
POTASSIUM: 3.9 meq/L (ref 3.5–5.3)
SODIUM: 141 meq/L (ref 135–145)
Total Bilirubin: 0.3 mg/dL (ref 0.2–1.2)
Total Protein: 7.1 g/dL (ref 6.0–8.3)

## 2014-05-28 LAB — TSH: TSH: 0.109 u[IU]/mL — ABNORMAL LOW (ref 0.350–4.500)

## 2014-05-28 MED ORDER — HYDROCHLOROTHIAZIDE 25 MG PO TABS: ORAL_TABLET | ORAL | Status: DC

## 2014-05-28 MED ORDER — METHYLPREDNISOLONE ACETATE 80 MG/ML IJ SUSP
80.0000 mg | Freq: Once | INTRAMUSCULAR | Status: AC
  Administered 2014-05-28: 80 mg via INTRAMUSCULAR

## 2014-05-28 NOTE — Patient Instructions (Signed)
Patient complaining of fatigue and musculoskeletal pain. Depo-Medrol given 80 mg IM. Says she cannot tolerate daily dose prednisone due to palpitations. HCTZ prescribed for fluid retention. Checked vitamin D level, CBC with differential, C met and TSH today. Return in 2 weeks for basic metabolic panel to follow-up on possible hypokalemia. However she is on an ACE inhibitor which should help spare potassium some.

## 2014-05-28 NOTE — Progress Notes (Signed)
   Subjective:    Patient ID: Sonya Franklin, female    DOB: Jan 30, 1966, 49 y.o.   MRN: [id number]  HPI  Patient has a history of hypertension and ankylosing spondylitis. Back in 2009, she used to take HCTZ for hypertension in fluid retention. Along the way she discontinued this medication. She says it caused hypokalemia. She is on an ACE inhibitor for hypertension which should [place] potassium somewhat. Now complaining of fingers and hand swelling. Having more musculoskeletal pain. Says she cannot tolerate low-dose daily prednisone prescribed by Dr. Justine Null in the past because of palpitations. Wants an injection of Depo-Medrol. Also complaining of fatigue. Wants vitamin D level checked. It was 22 back in September 2015 but she's not taking supplementation. Complaining of some vague dizziness. No recent respiratory infection. Complaining of slight cough.  Blood pressure is stable today.    Review of Systems     Objective:   Physical Exam  Skin warm and dry. Nodes none. TMs are obscured by cerumen bilaterally. She does not want me to clean nose out today. Pharynx is clear. Neck is supple. Chest clear to auscultation. No pitting edema of lower extremities.      Assessment & Plan:  Fatigue  Ankylosing spondylitis  History of hypertension treated with ACE inhibitor  Fluid retention  History of vitamin D deficiency  Plan: Screen for diabetes with history of fatigue, check vitamin D level, TSH. She has a history of low TSH followed by endocrinologist. Check CBC and complete metabolic panel. Depo-Medrol 80 mg IM today for musculoskeletal pain/ankylosing spondylitis.

## 2014-05-29 ENCOUNTER — Telehealth: Payer: Self-pay | Admitting: *Deleted

## 2014-05-29 LAB — VITAMIN D 25 HYDROXY (VIT D DEFICIENCY, FRACTURES): Vit D, 25-Hydroxy: 13 ng/mL — ABNORMAL LOW (ref 30–100)

## 2014-05-29 LAB — CBC WITH DIFFERENTIAL/PLATELET
BASOS PCT: 0 % (ref 0–1)
Basophils Absolute: 0 10*3/uL (ref 0.0–0.1)
EOS ABS: 0.1 10*3/uL (ref 0.0–0.7)
EOS PCT: 1 % (ref 0–5)
HCT: 36.1 % (ref 36.0–46.0)
Hemoglobin: 12 g/dL (ref 12.0–15.0)
Lymphocytes Relative: 19 % (ref 12–46)
Lymphs Abs: 1.5 10*3/uL (ref 0.7–4.0)
MCH: 29.2 pg (ref 26.0–34.0)
MCHC: 33.2 g/dL (ref 30.0–36.0)
MCV: 87.8 fL (ref 78.0–100.0)
MONO ABS: 0.5 10*3/uL (ref 0.1–1.0)
MPV: 8.5 fL — ABNORMAL LOW (ref 8.6–12.4)
Monocytes Relative: 6 % (ref 3–12)
NEUTROS ABS: 5.7 10*3/uL (ref 1.7–7.7)
NEUTROS PCT: 74 % (ref 43–77)
PLATELETS: 292 10*3/uL (ref 150–400)
RBC: 4.11 MIL/uL (ref 3.87–5.11)
RDW: 14.7 % (ref 11.5–15.5)
WBC: 7.7 10*3/uL (ref 4.0–10.5)

## 2014-05-29 LAB — HEMOGLOBIN A1C
HEMOGLOBIN A1C: 5.9 % — AB (ref <5.7)
MEAN PLASMA GLUCOSE: 123 mg/dL — AB (ref <117)

## 2014-05-29 MED ORDER — ERGOCALCIFEROL 1.25 MG (50000 UT) PO CAPS: 50000.0000 [IU] | ORAL_CAPSULE | ORAL | Status: DC

## 2014-05-29 NOTE — Telephone Encounter (Signed)
Reviewed lab results and medication instructions with patient . Reviewed diet with patient.

## 2014-06-14 ENCOUNTER — Other Ambulatory Visit (INDEPENDENT_AMBULATORY_CARE_PROVIDER_SITE_OTHER): Payer: Medicare Other | Admitting: Internal Medicine

## 2014-06-14 DIAGNOSIS — R7302 Impaired glucose tolerance (oral): Secondary | ICD-10-CM

## 2014-06-14 DIAGNOSIS — Z79899 Other long term (current) drug therapy: Secondary | ICD-10-CM

## 2014-06-14 LAB — BASIC METABOLIC PANEL WITH GFR
BUN: 13 mg/dL (ref 6–23)
CO2: 24 meq/L (ref 19–32)
Calcium: 9 mg/dL (ref 8.4–10.5)
Chloride: 99 meq/L (ref 96–112)
Creat: 0.74 mg/dL (ref 0.50–1.10)
Glucose, Bld: 93 mg/dL (ref 70–99)
Potassium: 3.6 meq/L (ref 3.5–5.3)
Sodium: 136 meq/L (ref 135–145)

## 2014-06-16 DIAGNOSIS — J309 Allergic rhinitis, unspecified: Secondary | ICD-10-CM | POA: Diagnosis not present

## 2014-06-16 DIAGNOSIS — J029 Acute pharyngitis, unspecified: Secondary | ICD-10-CM | POA: Diagnosis not present

## 2014-06-18 ENCOUNTER — Telehealth: Payer: Self-pay | Admitting: *Deleted

## 2014-06-18 MED ORDER — POTASSIUM CHLORIDE CRYS ER 20 MEQ PO TBCR: 20.0000 meq | EXTENDED_RELEASE_TABLET | Freq: Every day | ORAL | Status: DC

## 2014-06-18 NOTE — Telephone Encounter (Signed)
Patient scheduled for 07/02/2014 for repeat K+ level started on KDur

## 2014-06-30 ENCOUNTER — Other Ambulatory Visit: Payer: Self-pay | Admitting: Internal Medicine

## 2014-07-02 ENCOUNTER — Other Ambulatory Visit: Payer: Medicare Other | Admitting: Internal Medicine

## 2014-07-02 DIAGNOSIS — Z79899 Other long term (current) drug therapy: Secondary | ICD-10-CM

## 2014-07-02 LAB — POTASSIUM: Potassium: 3.8 mEq/L (ref 3.5–5.3)

## 2014-07-03 ENCOUNTER — Telehealth: Payer: Self-pay | Admitting: *Deleted

## 2014-07-04 NOTE — Telephone Encounter (Signed)
Reviewed lab work with patient and gave instructions to increase Kdur 7meq to bid scheduled for repeat K+ LEVEL 07/23/14

## 2014-07-10 ENCOUNTER — Other Ambulatory Visit: Payer: Self-pay | Admitting: Internal Medicine

## 2014-07-19 ENCOUNTER — Other Ambulatory Visit: Payer: Self-pay | Admitting: Internal Medicine

## 2014-07-30 ENCOUNTER — Other Ambulatory Visit: Payer: Self-pay | Admitting: Internal Medicine

## 2014-08-13 ENCOUNTER — Other Ambulatory Visit: Payer: Self-pay | Admitting: Internal Medicine

## 2014-08-16 ENCOUNTER — Other Ambulatory Visit: Payer: Self-pay | Admitting: Internal Medicine

## 2014-08-16 NOTE — Telephone Encounter (Signed)
Does not need to refill after 12 weeks of therapy go to 2000 units daily

## 2014-08-22 ENCOUNTER — Other Ambulatory Visit: Payer: Self-pay | Admitting: Internal Medicine

## 2014-09-03 DIAGNOSIS — G4733 Obstructive sleep apnea (adult) (pediatric): Secondary | ICD-10-CM | POA: Diagnosis not present

## 2014-09-14 DIAGNOSIS — C50919 Malignant neoplasm of unspecified site of unspecified female breast: Secondary | ICD-10-CM | POA: Diagnosis not present

## 2014-10-03 DIAGNOSIS — G4733 Obstructive sleep apnea (adult) (pediatric): Secondary | ICD-10-CM | POA: Diagnosis not present

## 2014-10-05 DIAGNOSIS — C50919 Malignant neoplasm of unspecified site of unspecified female breast: Secondary | ICD-10-CM | POA: Diagnosis not present

## 2014-11-03 DIAGNOSIS — G4733 Obstructive sleep apnea (adult) (pediatric): Secondary | ICD-10-CM | POA: Diagnosis not present

## 2014-11-30 ENCOUNTER — Telehealth: Payer: Self-pay | Admitting: Internal Medicine

## 2014-11-30 DIAGNOSIS — J069 Acute upper respiratory infection, unspecified: Secondary | ICD-10-CM

## 2014-11-30 MED ORDER — AZITHROMYCIN 250 MG PO TABS: ORAL_TABLET | ORAL | Status: DC

## 2014-11-30 NOTE — Telephone Encounter (Signed)
Called complaining of URI symptoms.: Zithromax Z-Pak to pharmacy.

## 2014-12-04 DIAGNOSIS — G4733 Obstructive sleep apnea (adult) (pediatric): Secondary | ICD-10-CM | POA: Diagnosis not present

## 2014-12-11 ENCOUNTER — Ambulatory Visit (INDEPENDENT_AMBULATORY_CARE_PROVIDER_SITE_OTHER): Payer: Medicare Other | Admitting: Internal Medicine

## 2014-12-11 ENCOUNTER — Encounter: Payer: Self-pay | Admitting: Internal Medicine

## 2014-12-11 VITALS — BP 122/74 | HR 76 | Temp 98.1°F | Ht 65.0 in | Wt 173.0 lb

## 2014-12-11 DIAGNOSIS — H6123 Impacted cerumen, bilateral: Secondary | ICD-10-CM

## 2014-12-11 DIAGNOSIS — M791 Myalgia: Secondary | ICD-10-CM

## 2014-12-11 DIAGNOSIS — Z789 Other specified health status: Secondary | ICD-10-CM | POA: Diagnosis not present

## 2014-12-11 DIAGNOSIS — J069 Acute upper respiratory infection, unspecified: Secondary | ICD-10-CM

## 2014-12-11 DIAGNOSIS — M7918 Myalgia, other site: Secondary | ICD-10-CM

## 2014-12-11 MED ORDER — AMITRIPTYLINE HCL 10 MG PO TABS: ORAL_TABLET | ORAL | Status: DC

## 2014-12-11 MED ORDER — METHYLPREDNISOLONE ACETATE 80 MG/ML IJ SUSP
80.0000 mg | Freq: Once | INTRAMUSCULAR | Status: AC
  Administered 2014-12-11: 80 mg via INTRAMUSCULAR

## 2014-12-11 NOTE — Progress Notes (Signed)
   Subjective:    Patient ID: Sonya Franklin, female    DOB: 18-Aug-1965, 49 y.o.   MRN: [id number]  HPI Patient called September 16 with acute respiratory infection symptoms. Zithromax Z-PAK was called in and symptoms improved. However now she is concerned about some swelling and discomfort in right anterior cervical area. She also felt a popping sensation coming down some steps in her right back earlier today.  Also complaining of some issues with hearing and thinks she may have cerumen in her ear canals.  History of ankylosing spondylitis and fibromyalgia. History of breast cancer. History of sleep apnea. Having trouble adjusting to C Pap machine. Not sleeping well.    Review of Systems see above     Objective:   Physical Exam  TMs show cerumen bilaterally. Pharynx is clear. Neck is supple without any adenopathy whatsoever. Chest clear to auscultation. No palpable back spasm.      Assessment & Plan:  Musculoskeletal pain  History of ankylosing spondylitis  Fibromyalgia  Impacted cerumen ear canals  Recent acute respiratory infection-resolved  Plan: Refer to ENT to have cerumen removed. Try Elavil 10 mg at bedtime for sleep and musculoskeletal pain. Depo-Medrol 80 mg IM given which usually gives her relief from acute pain.

## 2014-12-11 NOTE — Patient Instructions (Signed)
Appointment with ENT to have cerumen removed from external ear canal. Depo-Medrol 80 mg IM given for musculoskeletal pain. Try Elavil 10 mg at bedtime for sleep and musculoskeletal pain

## 2014-12-12 ENCOUNTER — Ambulatory Visit: Payer: Medicare Other | Admitting: Podiatry

## 2014-12-19 ENCOUNTER — Telehealth: Payer: Self-pay | Admitting: *Deleted

## 2014-12-19 DIAGNOSIS — H6123 Impacted cerumen, bilateral: Secondary | ICD-10-CM

## 2014-12-19 NOTE — Telephone Encounter (Signed)
Information faxed to Dr Berle Mull office they will set up appt with patient

## 2015-01-03 DIAGNOSIS — G4733 Obstructive sleep apnea (adult) (pediatric): Secondary | ICD-10-CM | POA: Diagnosis not present

## 2015-01-12 ENCOUNTER — Other Ambulatory Visit: Payer: Self-pay | Admitting: Internal Medicine

## 2015-02-03 DIAGNOSIS — G4733 Obstructive sleep apnea (adult) (pediatric): Secondary | ICD-10-CM | POA: Diagnosis not present

## 2015-02-12 ENCOUNTER — Other Ambulatory Visit: Payer: Self-pay | Admitting: Internal Medicine

## 2015-03-05 DIAGNOSIS — G4733 Obstructive sleep apnea (adult) (pediatric): Secondary | ICD-10-CM | POA: Diagnosis not present

## 2015-04-05 DIAGNOSIS — G4733 Obstructive sleep apnea (adult) (pediatric): Secondary | ICD-10-CM | POA: Diagnosis not present

## 2015-05-06 DIAGNOSIS — G4733 Obstructive sleep apnea (adult) (pediatric): Secondary | ICD-10-CM | POA: Diagnosis not present

## 2015-05-17 ENCOUNTER — Other Ambulatory Visit: Payer: Self-pay | Admitting: Internal Medicine

## 2015-05-26 ENCOUNTER — Other Ambulatory Visit: Payer: Self-pay | Admitting: Internal Medicine

## 2015-05-28 ENCOUNTER — Other Ambulatory Visit: Payer: Medicare Other | Admitting: Internal Medicine

## 2015-05-28 DIAGNOSIS — R7309 Other abnormal glucose: Secondary | ICD-10-CM

## 2015-05-28 DIAGNOSIS — E039 Hypothyroidism, unspecified: Secondary | ICD-10-CM | POA: Diagnosis not present

## 2015-05-28 DIAGNOSIS — E8881 Metabolic syndrome: Secondary | ICD-10-CM

## 2015-05-28 DIAGNOSIS — E559 Vitamin D deficiency, unspecified: Secondary | ICD-10-CM

## 2015-05-28 DIAGNOSIS — I10 Essential (primary) hypertension: Secondary | ICD-10-CM | POA: Diagnosis not present

## 2015-05-28 DIAGNOSIS — E119 Type 2 diabetes mellitus without complications: Secondary | ICD-10-CM

## 2015-05-28 DIAGNOSIS — E785 Hyperlipidemia, unspecified: Secondary | ICD-10-CM | POA: Diagnosis not present

## 2015-05-28 DIAGNOSIS — R7302 Impaired glucose tolerance (oral): Secondary | ICD-10-CM | POA: Diagnosis not present

## 2015-05-28 LAB — CBC WITH DIFFERENTIAL/PLATELET
BASOS ABS: 0.1 10*3/uL (ref 0.0–0.1)
Basophils Relative: 1 % (ref 0–1)
Eosinophils Absolute: 0.2 10*3/uL (ref 0.0–0.7)
Eosinophils Relative: 3 % (ref 0–5)
HEMATOCRIT: 37.5 % (ref 36.0–46.0)
HEMOGLOBIN: 12.1 g/dL (ref 12.0–15.0)
LYMPHS ABS: 1.7 10*3/uL (ref 0.7–4.0)
LYMPHS PCT: 30 % (ref 12–46)
MCH: 28.1 pg (ref 26.0–34.0)
MCHC: 32.3 g/dL (ref 30.0–36.0)
MCV: 87 fL (ref 78.0–100.0)
MPV: 8.9 fL (ref 8.6–12.4)
Monocytes Absolute: 0.5 10*3/uL (ref 0.1–1.0)
Monocytes Relative: 9 % (ref 3–12)
NEUTROS ABS: 3.2 10*3/uL (ref 1.7–7.7)
Neutrophils Relative %: 57 % (ref 43–77)
Platelets: 285 10*3/uL (ref 150–400)
RBC: 4.31 MIL/uL (ref 3.87–5.11)
RDW: 14 % (ref 11.5–15.5)
WBC: 5.7 10*3/uL (ref 4.0–10.5)

## 2015-05-28 LAB — COMPREHENSIVE METABOLIC PANEL
ALBUMIN: 4.3 g/dL (ref 3.6–5.1)
ALK PHOS: 75 U/L (ref 33–130)
ALT: 13 U/L (ref 6–29)
AST: 16 U/L (ref 10–35)
BUN: 13 mg/dL (ref 7–25)
CO2: 24 mmol/L (ref 20–31)
Calcium: 9.7 mg/dL (ref 8.6–10.4)
Chloride: 107 mmol/L (ref 98–110)
Creat: 0.58 mg/dL (ref 0.50–1.05)
Glucose, Bld: 90 mg/dL (ref 65–99)
POTASSIUM: 4.2 mmol/L (ref 3.5–5.3)
Sodium: 142 mmol/L (ref 135–146)
Total Bilirubin: 0.4 mg/dL (ref 0.2–1.2)
Total Protein: 7.5 g/dL (ref 6.1–8.1)

## 2015-05-28 LAB — TSH: TSH: 0.33 m[IU]/L — AB

## 2015-05-28 NOTE — Addendum Note (Signed)
Addended by: Milta Deiters on: 05/28/2015 10:28 AM   Modules accepted: Orders

## 2015-05-29 LAB — HEMOGLOBIN A1C
Hgb A1c MFr Bld: 5.8 % — ABNORMAL HIGH
Mean Plasma Glucose: 120 mg/dL — ABNORMAL HIGH

## 2015-05-29 LAB — VITAMIN D 25 HYDROXY (VIT D DEFICIENCY, FRACTURES): Vit D, 25-Hydroxy: 15 ng/mL — ABNORMAL LOW (ref 30–100)

## 2015-05-30 ENCOUNTER — Ambulatory Visit (INDEPENDENT_AMBULATORY_CARE_PROVIDER_SITE_OTHER): Payer: Medicare Other | Admitting: Internal Medicine

## 2015-05-30 ENCOUNTER — Encounter: Payer: Self-pay | Admitting: Internal Medicine

## 2015-05-30 VITALS — BP 128/80 | HR 74 | Temp 97.4°F | Resp 20 | Ht 65.0 in | Wt 177.0 lb

## 2015-05-30 DIAGNOSIS — M791 Myalgia: Secondary | ICD-10-CM

## 2015-05-30 DIAGNOSIS — Z0000 Encounter for general adult medical examination without abnormal findings: Secondary | ICD-10-CM

## 2015-05-30 DIAGNOSIS — R7989 Other specified abnormal findings of blood chemistry: Secondary | ICD-10-CM

## 2015-05-30 DIAGNOSIS — R946 Abnormal results of thyroid function studies: Secondary | ICD-10-CM

## 2015-05-30 DIAGNOSIS — M459 Ankylosing spondylitis of unspecified sites in spine: Secondary | ICD-10-CM

## 2015-05-30 DIAGNOSIS — Z853 Personal history of malignant neoplasm of breast: Secondary | ICD-10-CM

## 2015-05-30 DIAGNOSIS — I10 Essential (primary) hypertension: Secondary | ICD-10-CM | POA: Diagnosis not present

## 2015-05-30 DIAGNOSIS — E559 Vitamin D deficiency, unspecified: Secondary | ICD-10-CM

## 2015-05-30 DIAGNOSIS — G8929 Other chronic pain: Secondary | ICD-10-CM | POA: Diagnosis not present

## 2015-05-30 DIAGNOSIS — M7918 Myalgia, other site: Secondary | ICD-10-CM

## 2015-05-30 LAB — LIPID PANEL
CHOL/HDL RATIO: 2.7 ratio (ref <=5.0)
Cholesterol: 205 mg/dL — ABNORMAL HIGH (ref 125–200)
HDL: 77 mg/dL (ref >=46)
LDL CALC: 102 mg/dL (ref <130)
Triglycerides: 129 mg/dL (ref <150)
VLDL: 26 mg/dL (ref <30)

## 2015-05-30 LAB — POCT URINALYSIS DIPSTICK
BILIRUBIN UA: NEGATIVE
GLUCOSE UA: NEGATIVE
Ketones, UA: NEGATIVE
Leukocytes, UA: NEGATIVE
NITRITE UA: NEGATIVE
Protein, UA: NEGATIVE
RBC UA: NEGATIVE
Spec Grav, UA: >=1.03
Urobilinogen, UA: 0.2
pH, UA: 6.5

## 2015-05-30 MED ORDER — ERGOCALCIFEROL 1.25 MG (50000 UT) PO CAPS: 50000.0000 [IU] | ORAL_CAPSULE | ORAL | Status: DC

## 2015-05-30 MED ORDER — METHYLPREDNISOLONE ACETATE 80 MG/ML IJ SUSP
80.0000 mg | Freq: Once | INTRAMUSCULAR | Status: AC
  Administered 2015-05-30: 80 mg via INTRAMUSCULAR

## 2015-05-31 ENCOUNTER — Telehealth: Payer: Self-pay | Admitting: Internal Medicine

## 2015-05-31 NOTE — Telephone Encounter (Signed)
Fax from [facility].  Appointment made with Dr. Leigh Aurora for 06/19/15 @ 1:30.  Dx:  Ankylosing Spondylitis  Patient has been notified by Dr. Melissa Noon office.  Documentation was faxed over to their office (22 pages) via fax on 05/30/15.

## 2015-06-03 DIAGNOSIS — G4733 Obstructive sleep apnea (adult) (pediatric): Secondary | ICD-10-CM | POA: Diagnosis not present

## 2015-06-14 NOTE — Progress Notes (Signed)
Subjective:    Patient ID: Sonya Franklin, female    DOB: 1965/12/04, 50 y.o.   MRN: [id number]  HPI 50 year old 50 female in today for health maintenance exam and evaluation of medical issues. She is on disability with history of fibromyalgia syndrome, breast cancer, chronic musculoskeletal pain, ankylosing spondylitis.  She has a history of low TSH level followed by Dr. Dwyane Dee. 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   History of colonoscopy and endoscopy by Dr. Fuller Plan in 1999 showing mild esophagitis. Was admitted to the hospital November 2013 with abdominal pain. CT scan showed an inflammatory process. Stool for C. difficile and culture were negative. She was treated with Cipro and Flagyl and improved. She was complaining of recurrent headaches and CT of the brain was negative. She has no bright red blood per rectum or diarrhea just sometimes vague abdominal pain. Abdominal pain responds to steroids. Has not had to be treated recently for abdominal pain. Colonoscopy by Dr. Fuller Plan in 2014 was normal.  Had negative Cardiolite study October 2007. 2-D echocardiogram 2008 was negative. Carotid Dopplers 2006 negative. Pt had biopsy of complex thyroid nodule right inferior lobe November 2006 which showed benign colloid nodule.  In 2009 she had nerve conduction studies because of right upper extremity pain. These were normal.  History of trochanteric bursitis right hip.  Never feels well because of chronic musculoskeletal pain. Has decreased energy. This is long-standing.  She is to see Dr. Justine Null, rheumatologist for ankylosing spondylitis. Was on low-dose prednisone 5 mg daily 4. Of time. Was subsequently changed to Celebrex around 2005. In 2009, she had injection of right trochanteric bursa by Dr. Ouida Sills. From time to time I give her a Depo-Medrol injection for musculoskeletal pain and she improves. She is no longer seeing rheumatologist but would like to get another  evaluation.  Social history: She is divorced. She has a disabled son born in 7. Has started gaining some in the last few years and that has helped her stress. She receives disability benefits. 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 Does not smoke or consume alcohol.  Family history: History of goiter and diabetes as well as hypertension in mother. Father with history of diabetes and hypertension as well as gout.  Patient indicates she is allergic to Aleve and shellfish.  GYN is Dr. Melinda Crutch.  Total hysterectomy 2008, right breast cancer 2007. 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 Fish negative. She had adjuvant chemotherapy consisting of 4 cycles of Cytoxan and doxorubicin then weekly Taxol 4. 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 She had bilateral mastectomies November 2007 with no evidence of residual disease. She was started on tamoxifen in January 2008. She had breast reconstruction in 2008.    Review of Systems  chronic pain along the SI joints. Feels stiff and sore most days.     Objective:   Physical Exam  Constitutional: She is oriented to person, place, and time. She appears well-developed and well-nourished. No distress.  HENT:  Head: Normocephalic and atraumatic.  Right Ear: External ear normal.  Left Ear: External ear normal.  Mouth/Throat: Oropharynx is clear and moist. No oropharyngeal exudate.  Eyes: Pupils are equal, round, and reactive to light. Right eye exhibits no discharge. No scleral icterus.  Cardiovascular: Normal rate and regular rhythm.   No murmur heard. Pulmonary/Chest: Effort normal and breath sounds normal.  Abdominal: She exhibits no distension and no mass.  There is no tenderness. There is no rebound and no guarding.  Genitourinary:  Deferred to GYN  Musculoskeletal:  Tender over SI joints particularly left SI joint  Neurological: She is alert and oriented to person, place,  and time. She has normal reflexes. No cranial nerve deficit. Coordination normal.  Skin: Skin is warm. She is not diaphoretic.  Psychiatric: She has a normal mood and affect. Her behavior is normal. Judgment and thought content normal.  Vitals reviewed.         Assessment & Plan:  History of ankylosing spondylitis  History of breast cancer status post bilateral mastectomies with reconstruction surgery  History of low TSH  Multinodular border  ? Fibromyalgia-has chronic musculoskeletal pain  History of right trochanteric bursitis  Essential hypertension stable  History of right breast cancer  GE reflux  Anxiety  History of vitamin D deficiency  Plan: She request Depo-Medrol 80 mg IM. Refer to rheumatologist. She's not seen one in some time. Generally seen here yearly or twice yearly.  Subjective:   Patient presents for Medicare Annual/Subsequent preventive examination.  Review Past Medical/Family/Social:See above   Risk Factors  Current exercise habits: Sedentary Dietary issues discussed: Low fat low carbohydrate  Cardiac risk factors:HTN  Depression Screen  (Note: if answer to either of the following is "Yes", a more complete depression screening is indicated)   Over the past two weeks, have you felt down, depressed or hopeless? No  Over the past two weeks, have you felt little interest or pleasure in doing things? No Have you lost interest or pleasure in daily life? No Do you often feel hopeless? No Do you cry easily over simple problems? No   Activities of Daily Living  In your present state of health, do you have any difficulty performing the following activities?:   Driving? sometimes Managing money? No  Feeding yourself? No  Getting from bed to chair? yes Climbing a flight of stairs? yes Preparing food and eating?: yes- hurts every day Bathing or showering? Difficulty maneuvering with pain Getting dressed: yes- has pain every day Getting to the  toilet? No  Using the toilet:No  Moving around from place to place: No  In the past year have you fallen or had a near fall?:No  Are you sexually active? No  Do you have more than one partner? No   Hearing Difficulties: No  Do you often ask people to speak up or repeat themselves? No  Do you experience ringing or noises in your ears? Yes Do you have difficulty understanding soft or whispered voices? No  Do you feel that you have a problem with memory? Yes Do you often misplace items? Yes   Home Safety:  Do you have a smoke alarm at your residence? Yes Do you have grab bars in the bathroom?Yes Do you have throw rugs in your house? No   Cognitive Testing  Alert? Yes Normal Appearance?Yes  Oriented to person? Yes Place? Yes  Time? Yes  Recall of three objects? Yes  Can perform simple calculations? Yes  Displays appropriate judgment?Yes  Can read the correct time from a watch face?Yes   List the Names of Other Physician/Practitioners you currently use:  See referral list for the physicians patient is currently seeing.  GYN and Dr. Dwyane Dee   Review of Systems: See above   Objective:     General appearance: Appears stated age and mildly obese  Head: Normocephalic, without obvious abnormality, atraumatic  Eyes: conj clear, EOMi PEERLA  Ears:  normal TM's and external ear canals both ears  Nose: Nares normal. Septum midline. Mucosa normal. No drainage or sinus tenderness.  Throat: lips, mucosa, and tongue normal; teeth and gums normal  Neck: no adenopathy, no carotid bruit, no JVD, supple, symmetrical, trachea midline and thyroid not enlarged, symmetric, no tenderness/mass/nodules  No CVA tenderness.  Lungs: clear to auscultation bilaterally  Breasts: Bilateral mastectomies with reconstruction Heart: regular rate and rhythm, S1, S2 normal, no murmur, click, rub or gallop  Abdomen: soft, non-tender; bowel sounds normal; no masses, no organomegaly  Musculoskeletal: ROM normal  in all joints, no crepitus, no deformity, Normal muscle strengthen. Back  is symmetric, no curvature. Skin: Skin color, texture, turgor normal. No rashes or lesions  Lymph nodes: Cervical, supraclavicular, and axillary nodes normal.  Neurologic: CN 2 -12 Normal, Normal symmetric reflexes. Normal coordination and gait  Psych: Alert & Oriented x 3, Mood appear stable.    Assessment:    Annual wellness medicare exam   Plan:    During the course of the visit the patient was educated and counseled about appropriate screening and preventive services including:  Annual flu vaccine      Patient Instructions (the written plan) was given to the patient.  Medicare Attestation  I have personally reviewed:  The patient's medical and social history  Their use of alcohol, tobacco or illicit drugs  Their current medications and supplements  The patient's functional ability including ADLs,fall risks, home safety risks, cognitive, and hearing and visual impairment  Diet and physical activities  Evidence for depression or mood disorders  The patient's weight, height, BMI, and visual acuity have been recorded in the chart. I have made referrals, counseling, and provided education to the patient based on review of the above and I have provided the patient with a written personalized care plan for preventive services.

## 2015-06-14 NOTE — Patient Instructions (Addendum)
To see rheumatologist  in the near future. Continue same medications and return in 6 months. Depo-Medrol given today.

## 2015-06-16 ENCOUNTER — Other Ambulatory Visit: Payer: Self-pay | Admitting: Internal Medicine

## 2015-06-19 DIAGNOSIS — M5030 Other cervical disc degeneration, unspecified cervical region: Secondary | ICD-10-CM | POA: Diagnosis not present

## 2015-06-19 DIAGNOSIS — R5383 Other fatigue: Secondary | ICD-10-CM | POA: Diagnosis not present

## 2015-06-19 DIAGNOSIS — M5136 Other intervertebral disc degeneration, lumbar region: Secondary | ICD-10-CM | POA: Diagnosis not present

## 2015-06-19 DIAGNOSIS — M2550 Pain in unspecified joint: Secondary | ICD-10-CM | POA: Diagnosis not present

## 2015-07-31 DIAGNOSIS — M797 Fibromyalgia: Secondary | ICD-10-CM | POA: Diagnosis not present

## 2015-07-31 DIAGNOSIS — M5136 Other intervertebral disc degeneration, lumbar region: Secondary | ICD-10-CM | POA: Diagnosis not present

## 2015-07-31 DIAGNOSIS — M5030 Other cervical disc degeneration, unspecified cervical region: Secondary | ICD-10-CM | POA: Diagnosis not present

## 2015-07-31 DIAGNOSIS — M450 Ankylosing spondylitis of multiple sites in spine: Secondary | ICD-10-CM | POA: Diagnosis not present

## 2015-08-22 ENCOUNTER — Other Ambulatory Visit: Payer: Self-pay | Admitting: Internal Medicine

## 2015-10-08 DIAGNOSIS — C50911 Malignant neoplasm of unspecified site of right female breast: Secondary | ICD-10-CM | POA: Diagnosis not present

## 2015-11-15 ENCOUNTER — Ambulatory Visit (INDEPENDENT_AMBULATORY_CARE_PROVIDER_SITE_OTHER): Payer: Medicare Other | Admitting: Internal Medicine

## 2015-11-15 ENCOUNTER — Other Ambulatory Visit: Payer: Self-pay | Admitting: Internal Medicine

## 2015-11-15 ENCOUNTER — Encounter: Payer: Self-pay | Admitting: Internal Medicine

## 2015-11-15 VITALS — BP 114/64 | HR 61 | Temp 98.2°F | Ht 65.0 in | Wt 176.0 lb

## 2015-11-15 DIAGNOSIS — R1084 Generalized abdominal pain: Secondary | ICD-10-CM

## 2015-11-15 DIAGNOSIS — M459 Ankylosing spondylitis of unspecified sites in spine: Secondary | ICD-10-CM | POA: Diagnosis not present

## 2015-11-15 MED ORDER — METHYLPREDNISOLONE ACETATE 80 MG/ML IJ SUSP
80.0000 mg | Freq: Once | INTRAMUSCULAR | Status: AC
  Administered 2015-11-15: 80 mg via INTRAMUSCULAR

## 2015-11-20 DIAGNOSIS — C50911 Malignant neoplasm of unspecified site of right female breast: Secondary | ICD-10-CM | POA: Diagnosis not present

## 2015-12-14 ENCOUNTER — Encounter: Payer: Self-pay | Admitting: Internal Medicine

## 2015-12-14 NOTE — Patient Instructions (Signed)
Depo-Medrol 80 mg IM given. Return as needed.

## 2015-12-14 NOTE — Progress Notes (Signed)
   Subjective:    Patient ID: Sonya Franklin, female    DOB: June 18, 1965, 50 y.o.   MRN: [id number]  HPI Patient with long-standing history of ankylosing spondylitis with recurrent musculoskeletal pain. Having more back pain recently. Her gentleman friend is critically ill at the [facility] in [place]. He may not pull through. This is been upsetting for her. He apparently has cirrhosis of the liver.  Patient says she needs injection of Depo-Medrol today to deal with back pain.    Review of Systems as above     Objective:   Physical Exam  She is tender over her posterior superior iliac spine on the right      Assessment & Plan:  Ankylosing fondle Linus  Plan: Depo-Medrol 80 mg IM. This seems to give her good relief and she prefers this to taking oral prednisone. Return as needed.

## 2015-12-23 DIAGNOSIS — H1013 Acute atopic conjunctivitis, bilateral: Secondary | ICD-10-CM | POA: Diagnosis not present

## 2015-12-23 DIAGNOSIS — H40033 Anatomical narrow angle, bilateral: Secondary | ICD-10-CM | POA: Diagnosis not present

## 2015-12-25 ENCOUNTER — Other Ambulatory Visit: Payer: Self-pay | Admitting: Internal Medicine

## 2015-12-25 NOTE — Telephone Encounter (Signed)
Verbal order by Dr. Renold Genta to refill Lisinopril 20mg , #90, no refills.  To [name] to be e-scribed.

## 2015-12-26 ENCOUNTER — Other Ambulatory Visit: Payer: Self-pay | Admitting: Internal Medicine

## 2016-01-16 ENCOUNTER — Other Ambulatory Visit: Payer: Self-pay | Admitting: Internal Medicine

## 2016-02-12 ENCOUNTER — Other Ambulatory Visit: Payer: Self-pay | Admitting: Internal Medicine

## 2016-02-12 NOTE — Telephone Encounter (Signed)
Please call pharmacy was only supposed to take weekly for 12 weeks then  Is totake 2000 units D3 over the counter daily

## 2016-02-21 ENCOUNTER — Other Ambulatory Visit: Payer: Self-pay | Admitting: Internal Medicine

## 2016-03-01 ENCOUNTER — Other Ambulatory Visit: Payer: Self-pay | Admitting: Internal Medicine

## 2016-03-03 ENCOUNTER — Ambulatory Visit (INDEPENDENT_AMBULATORY_CARE_PROVIDER_SITE_OTHER): Payer: Medicare Other | Admitting: Internal Medicine

## 2016-03-03 ENCOUNTER — Encounter: Payer: Self-pay | Admitting: Internal Medicine

## 2016-03-03 VITALS — BP 130/80 | HR 78 | Temp 97.4°F | Ht 65.0 in | Wt 180.0 lb

## 2016-03-03 DIAGNOSIS — I10 Essential (primary) hypertension: Secondary | ICD-10-CM

## 2016-03-03 DIAGNOSIS — R946 Abnormal results of thyroid function studies: Secondary | ICD-10-CM | POA: Diagnosis not present

## 2016-03-03 DIAGNOSIS — M459 Ankylosing spondylitis of unspecified sites in spine: Secondary | ICD-10-CM

## 2016-03-03 DIAGNOSIS — H1032 Unspecified acute conjunctivitis, left eye: Secondary | ICD-10-CM

## 2016-03-03 DIAGNOSIS — R7989 Other specified abnormal findings of blood chemistry: Secondary | ICD-10-CM

## 2016-03-03 DIAGNOSIS — R7302 Impaired glucose tolerance (oral): Secondary | ICD-10-CM | POA: Diagnosis not present

## 2016-03-03 DIAGNOSIS — R5383 Other fatigue: Secondary | ICD-10-CM | POA: Diagnosis not present

## 2016-03-03 LAB — CBC WITH DIFFERENTIAL/PLATELET
BASOS ABS: 0 {cells}/uL (ref 0–200)
Basophils Relative: 0 %
EOS ABS: 58 {cells}/uL (ref 15–500)
EOS PCT: 1 %
HCT: 36 % (ref 35.0–45.0)
Hemoglobin: 11.4 g/dL — ABNORMAL LOW (ref 11.7–15.5)
LYMPHS PCT: 26 %
Lymphs Abs: 1508 cells/uL (ref 850–3900)
MCH: 28.1 pg (ref 27.0–33.0)
MCHC: 31.7 g/dL — AB (ref 32.0–36.0)
MCV: 88.9 fL (ref 80.0–100.0)
MONOS PCT: 6 %
MPV: 8.3 fL (ref 7.5–12.5)
Monocytes Absolute: 348 cells/uL (ref 200–950)
NEUTROS ABS: 3886 {cells}/uL (ref 1500–7800)
NEUTROS PCT: 67 %
PLATELETS: 271 10*3/uL (ref 140–400)
RBC: 4.05 MIL/uL (ref 3.80–5.10)
RDW: 14.6 % (ref 11.0–15.0)
WBC: 5.8 10*3/uL (ref 3.8–10.8)

## 2016-03-03 LAB — HEMOGLOBIN A1C
HEMOGLOBIN A1C: 5.4 % (ref <5.7)
MEAN PLASMA GLUCOSE: 108 mg/dL

## 2016-03-03 LAB — T4, FREE: FREE T4: 1.1 ng/dL (ref 0.8–1.8)

## 2016-03-03 LAB — TSH: TSH: 0.25 mIU/L — ABNORMAL LOW

## 2016-03-03 MED ORDER — METHYLPREDNISOLONE ACETATE 80 MG/ML IJ SUSP
80.0000 mg | Freq: Once | INTRAMUSCULAR | Status: AC
  Administered 2016-03-03: 80 mg via INTRAMUSCULAR

## 2016-03-03 MED ORDER — OFLOXACIN 0.3 % OP SOLN: OPHTHALMIC | 0 refills | Status: DC

## 2016-03-03 NOTE — Progress Notes (Signed)
   Subjective:    Patient ID: Sonya Franklin, female    DOB: [birth year], 50 y.o.   MRN: [id number]  HPI  50 year old Female with ankylosing spondylitis. Is having a flareup again with considerable pain. Generally gets relief with a Depo-Medrol injection.  Also worried about her blood pressure. Blood pressure today is acceptable at 130/80.  She was here in September. Gen. one friend that was ill has returned home and is doing fairly well. This was last time she had a Depo-Medrol injection was in September 2017.  Issues with fatigue. Lab work will be checked today.  History of hypertension treated with metoprolol 50 mg daily and lisinopril 20 mg daily as well as HCTZ 25 mg daily.    Review of Systems see above     Objective:   Physical Exam  Tender over posterior superior iliac spine particular the right Chest clear to auscultation. Cardiac exam regular rate and rhythm normal S1 and S2. Extremity is without edema. No thyromegaly. Has developed conjunctivitis.       Assessment & Plan:  Ankylosing spondylitis with considerable pain. Depo-Medrol 80 mg IM given  Essential hypertension-stable  Conjunctivitis-treat with Ocuflox ophthalmic drops  Fatigue-lab work drawn and pending with the exception of lipid panel and she is not fasting. TSH is low at 0.25 but she has a history of low TSH. Free T4 is normal. Hemoglobin is 11.4 g with normal MCV periods C met is normal.  Impaired glucose tolerance. Hemoglobin A1c checked several months ago was 5.8% and is now 5.4%  Plan: Monitor blood pressure at home. Return as needed.

## 2016-03-04 LAB — COMPLETE METABOLIC PANEL WITH GFR
ALT: 13 U/L (ref 6–29)
AST: 13 U/L (ref 10–35)
Albumin: 4 g/dL (ref 3.6–5.1)
Alkaline Phosphatase: 69 U/L (ref 33–130)
BILIRUBIN TOTAL: 0.3 mg/dL (ref 0.2–1.2)
BUN: 10 mg/dL (ref 7–25)
CHLORIDE: 107 mmol/L (ref 98–110)
CO2: 24 mmol/L (ref 20–31)
Calcium: 9 mg/dL (ref 8.6–10.4)
Creat: 0.59 mg/dL (ref 0.50–1.05)
GLUCOSE: 92 mg/dL (ref 65–99)
POTASSIUM: 4 mmol/L (ref 3.5–5.3)
SODIUM: 141 mmol/L (ref 135–146)
TOTAL PROTEIN: 6.9 g/dL (ref 6.1–8.1)

## 2016-03-15 NOTE — Patient Instructions (Signed)
Lab work drawn and pending. Depo-Medrol 80 mg IM for ankylosing spondylitis. Ocuflox ophthalmic drops for conjunctivitis. Continue to monitor blood pressure at home and continue same medications.

## 2016-03-23 ENCOUNTER — Other Ambulatory Visit: Payer: Self-pay | Admitting: Internal Medicine

## 2016-06-08 ENCOUNTER — Ambulatory Visit (INDEPENDENT_AMBULATORY_CARE_PROVIDER_SITE_OTHER): Payer: Medicare Other | Admitting: Internal Medicine

## 2016-06-08 ENCOUNTER — Encounter: Payer: Self-pay | Admitting: Internal Medicine

## 2016-06-08 VITALS — BP 140/88 | HR 69 | Temp 97.2°F | Wt 180.0 lb

## 2016-06-08 DIAGNOSIS — R42 Dizziness and giddiness: Secondary | ICD-10-CM

## 2016-06-08 DIAGNOSIS — M7918 Myalgia, other site: Secondary | ICD-10-CM

## 2016-06-08 DIAGNOSIS — M459 Ankylosing spondylitis of unspecified sites in spine: Secondary | ICD-10-CM

## 2016-06-08 DIAGNOSIS — I10 Essential (primary) hypertension: Secondary | ICD-10-CM | POA: Diagnosis not present

## 2016-06-08 DIAGNOSIS — H1013 Acute atopic conjunctivitis, bilateral: Secondary | ICD-10-CM | POA: Diagnosis not present

## 2016-06-08 DIAGNOSIS — M791 Myalgia: Secondary | ICD-10-CM

## 2016-06-08 MED ORDER — OLOPATADINE HCL 0.1 % OP SOLN: 1.0000 [drp] | Freq: Two times a day (BID) | OPHTHALMIC | 12 refills | Status: DC

## 2016-06-08 MED ORDER — OLMESARTAN MEDOXOMIL 40 MG PO TABS: 40.0000 mg | ORAL_TABLET | Freq: Every day | ORAL | 0 refills | Status: DC

## 2016-06-08 MED ORDER — METHYLPREDNISOLONE ACETATE 80 MG/ML IJ SUSP
80.0000 mg | Freq: Once | INTRAMUSCULAR | Status: AC
  Administered 2016-06-08: 80 mg via INTRAMUSCULAR

## 2016-06-08 NOTE — Progress Notes (Signed)
   Subjective:    Patient ID: Sonya Franklin, female    DOB: May 27, 1965, 51 y.o.   MRN: [id number]  HPI  51 year old Female with elevated BP. Has History of ankylosing spondylitis and is having more issues with musculoskeletal pain recently. She likes  to get an injection of Depo-Medrol a few times a year. Doesn't like taking chronic medication.  Says she has felt a bit dizzy recently. This could be related to her blood pressure perhaps in her ear.  Frequently feels anxious when driving. Says this is a new issue for her. She previously was on Xanax a number of years ago but doesn't want that at this point in time.  Says eyes a been itchy and irritated recently. We have previously used Patanol eyedrops during allergy season and we'll refill those for her.       Review of Systems     Objective:   Physical Exam She has rightward nystagmus on extraocular movement testing. Eyes are puffy and irritated but no evidence of drainage or conjunctivitis. Chest clear. Cardiac exam regular rate and rhythm.       Assessment & Plan:  Allergic conjunctivitis HTN Ankylosing spondylitis Vertigo Anxiety  Plan: May purchase meclizine over-the-counter and take up to 3 times daily as needed for vertigo. Patanol eyedrops for allergic conjunctivitis. Depo-Medrol 80 mg IM. Discontinue lisinopril 20 mg daily. Prescribe Benicar 40 mg daily. May take HCTZ 25 mg daily. Continue Norvasc 5 mg daily. Doesn't like taking metoprolol. Says it's making her tired. Was only taking it once a day instead of twice a day as prescribed. This will be discontinued. Was not taking HCTZ on a daily basis either.  Return in 3 weeks for office visit blood pressure check and basic metabolic panel.  Depo-Medrol 80 mg IM given today.

## 2016-06-08 NOTE — Patient Instructions (Addendum)
Was a pleasure to see you today. Return in 3 weeks. Discontinue lisinopril. Start Benicar 40 mg daily. Discontinue Toprol. Continue Norvasc 5 mg daily. Use Patanol eyedrops as prescribed. Continue HCTZ. May take meclizine for dizziness. Depo-Medrol 80 mg IM given today.

## 2016-06-30 ENCOUNTER — Ambulatory Visit (INDEPENDENT_AMBULATORY_CARE_PROVIDER_SITE_OTHER): Payer: Medicare Other | Admitting: Internal Medicine

## 2016-06-30 ENCOUNTER — Ambulatory Visit
Admission: RE | Admit: 2016-06-30 | Discharge: 2016-06-30 | Disposition: A | Discharge status: Home / Self Care | Payer: Medicare Other | Source: Ambulatory Visit | Attending: Internal Medicine | Admitting: Internal Medicine

## 2016-06-30 ENCOUNTER — Encounter: Payer: Self-pay | Admitting: Internal Medicine

## 2016-06-30 VITALS — BP 132/82 | HR 81 | Temp 97.1°F | Ht 65.0 in | Wt 181.0 lb

## 2016-06-30 DIAGNOSIS — M5440 Lumbago with sciatica, unspecified side: Secondary | ICD-10-CM

## 2016-06-30 DIAGNOSIS — M459 Ankylosing spondylitis of unspecified sites in spine: Secondary | ICD-10-CM

## 2016-06-30 DIAGNOSIS — M542 Cervicalgia: Secondary | ICD-10-CM | POA: Diagnosis not present

## 2016-06-30 DIAGNOSIS — I10 Essential (primary) hypertension: Secondary | ICD-10-CM | POA: Diagnosis not present

## 2016-06-30 DIAGNOSIS — M47816 Spondylosis without myelopathy or radiculopathy, lumbar region: Secondary | ICD-10-CM | POA: Diagnosis not present

## 2016-06-30 MED ORDER — AMLODIPINE BESYLATE 5 MG PO TABS: 5.0000 mg | ORAL_TABLET | Freq: Every day | ORAL | 5 refills | Status: DC

## 2016-06-30 MED ORDER — OLMESARTAN MEDOXOMIL 40 MG PO TABS: 40.0000 mg | ORAL_TABLET | Freq: Every day | ORAL | 0 refills | Status: DC

## 2016-06-30 NOTE — Patient Instructions (Addendum)
Take Benicar and Norvasc every morning. Consider taking HCTZ daily. Return in 2 weeks for follow-up. Needs b-met upon return

## 2016-06-30 NOTE — Progress Notes (Signed)
   Subjective:    Patient ID: Sonya Franklin, female    DOB: 1965/09/22, 51 y.o.   MRN: [id number]  HPI 51 year old Female in today for follow-up on hypertension. At last visit we changed her blood pressure medication from Prinivil 20 mg daily to Benicar 40 mg daily. She takes HCTZ on a when necessary basis when she has fluid retention. I would prefer that she take it daily but she does not. She's been taking amlodipine at night. I would prefer that she take both blood pressure medications every morning. She will start to do this. Blood pressure is improved but she feels a bit sluggish. At last visit blood pressure was 140/88. Today it is 132/82.  History of ankylosing spondylitis. Considerable issues with pain. Asking when she'll get another injection of Depo-Medrol. Says prednisone makes her heart race and she cannot take that daily. Considered low-dose prednisone 5 mg daily but she doesn't want try that.  She wants to get her spine x-rayed. I have ordered C-spine and LS-spine films    Review of Systems see above     Objective:   Physical Exam  Not examined. Spent 15 minutes speaking with her about these issues and concerns. She needs to take Benicar and amlodipine in the mornings. I would prefer that she take HCTZ daily but she doesn't want to do that.      Assessment & Plan:  Essential hypertension  History of ankylosing spondylitis  Plan: She'll have C-spine and LS-spine films. Continue these medications and take Benicar and amlodipine every morning. Return in 2 weeks for office visit, blood pressure check and injection of Depo-Medrol.  If stays on Benicar needs b- met upon return

## 2016-07-14 ENCOUNTER — Encounter: Payer: Self-pay | Admitting: Internal Medicine

## 2016-07-14 ENCOUNTER — Ambulatory Visit (INDEPENDENT_AMBULATORY_CARE_PROVIDER_SITE_OTHER): Payer: Medicare Other | Admitting: Internal Medicine

## 2016-07-14 VITALS — BP 120/80 | HR 132 | Temp 101.5°F | Wt 177.0 lb

## 2016-07-14 DIAGNOSIS — K529 Noninfective gastroenteritis and colitis, unspecified: Secondary | ICD-10-CM | POA: Diagnosis not present

## 2016-07-14 DIAGNOSIS — R197 Diarrhea, unspecified: Secondary | ICD-10-CM

## 2016-07-14 LAB — CBC WITH DIFFERENTIAL/PLATELET
BASOS PCT: 0 %
Basophils Absolute: 0 cells/uL (ref 0–200)
EOS ABS: 0 {cells}/uL — AB (ref 15–500)
EOS PCT: 0 %
HCT: 36.4 % (ref 35.0–45.0)
Hemoglobin: 11.9 g/dL (ref 11.7–15.5)
LYMPHS PCT: 9 %
Lymphs Abs: 693 cells/uL — ABNORMAL LOW (ref 850–3900)
MCH: 28.3 pg (ref 27.0–33.0)
MCHC: 32.7 g/dL (ref 32.0–36.0)
MCV: 86.5 fL (ref 80.0–100.0)
MPV: 8.3 fL (ref 7.5–12.5)
Monocytes Absolute: 693 cells/uL (ref 200–950)
Monocytes Relative: 9 %
NEUTROS ABS: 6314 {cells}/uL (ref 1500–7800)
Neutrophils Relative %: 82 %
Platelets: 245 10*3/uL (ref 140–400)
RBC: 4.21 MIL/uL (ref 3.80–5.10)
RDW: 14.7 % (ref 11.0–15.0)
WBC: 7.7 10*3/uL (ref 3.8–10.8)

## 2016-07-14 MED ORDER — PROMETHAZINE HCL 25 MG PO TABS: 25.0000 mg | ORAL_TABLET | Freq: Three times a day (TID) | ORAL | 0 refills | Status: DC | PRN

## 2016-07-14 NOTE — Progress Notes (Signed)
   Subjective:    Patient ID: Sonya Franklin, female    DOB: 23-Oct-1965, 51 y.o.   MRN: [id number]  HPI 51 year old Female with onset nausea and diarrhea yesterday.Today she has temperature of 101.5 orally  She is tachycardic. She feels nauseated and has had some 10 episodes of diarrhea since about 3 AM. She had a salad yesterday from [employer]. It had onions tomatoes and let us. It was not romaine lettuce. She did eat pizza the previous day. Has not been around anyone that she knows of that his heel. No travel history.  She initially thought that Benicar was making her sick. She's been on Benicar for several days nail for hypertension.  She has some mild orthostasis today in the office and I think his volume depleted.    Review of Systems see above. No abdominal pain. No blood in bowel movement.     Objective:   Physical Exam Skin warm and dry. She moves slowly. Chest clear. Cardiac exam tachycardic regular rate and rhythm.       Assessment & Plan:  Probable viral gastroenteritis  Plan: CBC and C- met drawn and pending. Offered injection of Zofran but she declined saying that when she had breast cancer she did not like the way Zofran made her feel. Gave her Phenergan 25 mg tablets #20 one by mouth every 6 hours when necessary nausea and vomiting. Patient needs to hydrate herself as I suspect she has volume depletion. Should stay with clear liquids preferably ginger ale. May the saltine crackers if hungry. Call with progress report tomorrow morning. Hold Benicar for now. Hold diuretic for now.

## 2016-07-14 NOTE — Patient Instructions (Signed)
Hold Benicar and diuretic. Take Phenergan 1 by mouth every 6 hours when necessary nausea and vomiting. Stay with clear liquids such as ginger ale and force fluids because I think you have an element of volume depletion/dehydration. Call with progress report tomorrow. Tylenol for fever. Labs pending.

## 2016-07-15 LAB — COMPREHENSIVE METABOLIC PANEL
ALBUMIN: 4.1 g/dL (ref 3.6–5.1)
ALK PHOS: 79 U/L (ref 33–130)
ALT: 14 U/L (ref 6–29)
AST: 15 U/L (ref 10–35)
BILIRUBIN TOTAL: 0.4 mg/dL (ref 0.2–1.2)
BUN: 9 mg/dL (ref 7–25)
CO2: 20 mmol/L (ref 20–31)
CREATININE: 0.66 mg/dL (ref 0.50–1.05)
Calcium: 8.9 mg/dL (ref 8.6–10.4)
Chloride: 106 mmol/L (ref 98–110)
GLUCOSE: 92 mg/dL (ref 65–99)
Potassium: 3.7 mmol/L (ref 3.5–5.3)
SODIUM: 138 mmol/L (ref 135–146)
Total Protein: 7.3 g/dL (ref 6.1–8.1)

## 2016-07-17 ENCOUNTER — Observation Stay (HOSPITAL_COMMUNITY)
Admission: EM | Admit: 2016-07-17 | Discharge: 2016-07-18 | Disposition: A | Discharge status: Home / Self Care | Payer: Medicare Other | Attending: Internal Medicine | Admitting: Internal Medicine

## 2016-07-17 ENCOUNTER — Emergency Department (HOSPITAL_COMMUNITY): Payer: Medicare Other

## 2016-07-17 ENCOUNTER — Ambulatory Visit: Payer: Medicare Other | Admitting: Internal Medicine

## 2016-07-17 ENCOUNTER — Telehealth: Payer: Self-pay

## 2016-07-17 ENCOUNTER — Encounter (HOSPITAL_COMMUNITY): Payer: Self-pay | Admitting: Emergency Medicine

## 2016-07-17 DIAGNOSIS — R197 Diarrhea, unspecified: Secondary | ICD-10-CM | POA: Diagnosis not present

## 2016-07-17 DIAGNOSIS — K219 Gastro-esophageal reflux disease without esophagitis: Secondary | ICD-10-CM | POA: Insufficient documentation

## 2016-07-17 DIAGNOSIS — R109 Unspecified abdominal pain: Secondary | ICD-10-CM | POA: Diagnosis not present

## 2016-07-17 DIAGNOSIS — N179 Acute kidney failure, unspecified: Principal | ICD-10-CM | POA: Diagnosis present

## 2016-07-17 DIAGNOSIS — R739 Hyperglycemia, unspecified: Secondary | ICD-10-CM | POA: Diagnosis not present

## 2016-07-17 DIAGNOSIS — Z6834 Body mass index (BMI) 34.0-34.9, adult: Secondary | ICD-10-CM

## 2016-07-17 DIAGNOSIS — E6609 Other obesity due to excess calories: Secondary | ICD-10-CM | POA: Diagnosis not present

## 2016-07-17 DIAGNOSIS — Z853 Personal history of malignant neoplasm of breast: Secondary | ICD-10-CM | POA: Insufficient documentation

## 2016-07-17 DIAGNOSIS — R7309 Other abnormal glucose: Secondary | ICD-10-CM | POA: Diagnosis not present

## 2016-07-17 DIAGNOSIS — R110 Nausea: Secondary | ICD-10-CM | POA: Diagnosis not present

## 2016-07-17 DIAGNOSIS — N2889 Other specified disorders of kidney and ureter: Secondary | ICD-10-CM | POA: Diagnosis not present

## 2016-07-17 DIAGNOSIS — E559 Vitamin D deficiency, unspecified: Secondary | ICD-10-CM | POA: Diagnosis not present

## 2016-07-17 DIAGNOSIS — E876 Hypokalemia: Secondary | ICD-10-CM | POA: Diagnosis present

## 2016-07-17 DIAGNOSIS — R1111 Vomiting without nausea: Secondary | ICD-10-CM | POA: Diagnosis not present

## 2016-07-17 DIAGNOSIS — R946 Abnormal results of thyroid function studies: Secondary | ICD-10-CM | POA: Diagnosis not present

## 2016-07-17 DIAGNOSIS — I10 Essential (primary) hypertension: Secondary | ICD-10-CM | POA: Diagnosis not present

## 2016-07-17 DIAGNOSIS — E860 Dehydration: Secondary | ICD-10-CM | POA: Diagnosis present

## 2016-07-17 DIAGNOSIS — E669 Obesity, unspecified: Secondary | ICD-10-CM | POA: Diagnosis present

## 2016-07-17 DIAGNOSIS — R7989 Other specified abnormal findings of blood chemistry: Secondary | ICD-10-CM | POA: Diagnosis present

## 2016-07-17 DIAGNOSIS — A045 Campylobacter enteritis: Secondary | ICD-10-CM | POA: Diagnosis not present

## 2016-07-17 DIAGNOSIS — Z79899 Other long term (current) drug therapy: Secondary | ICD-10-CM | POA: Diagnosis not present

## 2016-07-17 DIAGNOSIS — Z6833 Body mass index (BMI) 33.0-33.9, adult: Secondary | ICD-10-CM | POA: Insufficient documentation

## 2016-07-17 DIAGNOSIS — Z9013 Acquired absence of bilateral breasts and nipples: Secondary | ICD-10-CM | POA: Insufficient documentation

## 2016-07-17 LAB — CBC
HCT: 40.1 % (ref 36.0–46.0)
Hemoglobin: 13.7 g/dL (ref 12.0–15.0)
MCH: 28.7 pg (ref 26.0–34.0)
MCHC: 34.2 g/dL (ref 30.0–36.0)
MCV: 83.9 fL (ref 78.0–100.0)
PLATELETS: 320 10*3/uL (ref 150–400)
RBC: 4.78 MIL/uL (ref 3.87–5.11)
RDW: 13.2 % (ref 11.5–15.5)
WBC: 9 10*3/uL (ref 4.0–10.5)

## 2016-07-17 LAB — LIPASE, BLOOD: LIPASE: 24 U/L (ref 11–51)

## 2016-07-17 LAB — URINALYSIS, ROUTINE W REFLEX MICROSCOPIC
Bilirubin Urine: NEGATIVE
Glucose, UA: NEGATIVE mg/dL
HGB URINE DIPSTICK: NEGATIVE
Ketones, ur: 5 mg/dL — AB
LEUKOCYTES UA: NEGATIVE
Nitrite: NEGATIVE
PH: 5 (ref 5.0–8.0)
Protein, ur: NEGATIVE mg/dL
SPECIFIC GRAVITY, URINE: 1.017 (ref 1.005–1.030)

## 2016-07-17 LAB — COMPREHENSIVE METABOLIC PANEL
ALK PHOS: 82 U/L (ref 38–126)
ALT: 15 U/L (ref 14–54)
AST: 20 U/L (ref 15–41)
Albumin: 4.8 g/dL (ref 3.5–5.0)
Anion gap: 16 — ABNORMAL HIGH (ref 5–15)
BILIRUBIN TOTAL: 0.7 mg/dL (ref 0.3–1.2)
BUN: 53 mg/dL — AB (ref 6–20)
CALCIUM: 9.7 mg/dL (ref 8.9–10.3)
CO2: 22 mmol/L (ref 22–32)
CREATININE: 3.13 mg/dL — AB (ref 0.44–1.00)
Chloride: 99 mmol/L — ABNORMAL LOW (ref 101–111)
GFR calc Af Amer: 19 mL/min — ABNORMAL LOW (ref >60)
GFR, EST NON AFRICAN AMERICAN: 16 mL/min — AB (ref >60)
GLUCOSE: 116 mg/dL — AB (ref 65–99)
Potassium: 3.2 mmol/L — ABNORMAL LOW (ref 3.5–5.1)
Sodium: 137 mmol/L (ref 135–145)
TOTAL PROTEIN: 9.5 g/dL — AB (ref 6.5–8.1)

## 2016-07-17 LAB — TSH: TSH: 0.1 u[IU]/mL — AB (ref 0.350–4.500)

## 2016-07-17 LAB — MAGNESIUM: MAGNESIUM: 2.5 mg/dL — AB (ref 1.7–2.4)

## 2016-07-17 LAB — PHOSPHORUS: PHOSPHORUS: 3.8 mg/dL (ref 2.5–4.6)

## 2016-07-17 MED ORDER — ACETAMINOPHEN 650 MG RE SUPP: 650.0000 mg | Freq: Four times a day (QID) | RECTAL | Status: DC | PRN

## 2016-07-17 MED ORDER — ONDANSETRON HCL 4 MG/2ML IJ SOLN
4.0000 mg | Freq: Once | INTRAMUSCULAR | Status: AC
  Administered 2016-07-17: 4 mg via INTRAVENOUS
  Filled 2016-07-17: qty 2

## 2016-07-17 MED ORDER — HEPARIN SODIUM (PORCINE) 5000 UNIT/ML IJ SOLN
5000.0000 [IU] | Freq: Three times a day (TID) | INTRAMUSCULAR | Status: DC
  Administered 2016-07-17 – 2016-07-18 (×2): 5000 [IU] via SUBCUTANEOUS
  Filled 2016-07-17 (×2): qty 1

## 2016-07-17 MED ORDER — ACETAMINOPHEN 325 MG PO TABS: 650.0000 mg | ORAL_TABLET | Freq: Four times a day (QID) | ORAL | Status: DC | PRN

## 2016-07-17 MED ORDER — OLOPATADINE HCL 0.1 % OP SOLN
1.0000 [drp] | Freq: Two times a day (BID) | OPHTHALMIC | Status: DC
  Filled 2016-07-17: qty 5

## 2016-07-17 MED ORDER — HYOSCYAMINE SULFATE 0.125 MG SL SUBL
0.1250 mg | SUBLINGUAL_TABLET | Freq: Four times a day (QID) | SUBLINGUAL | Status: DC | PRN
  Filled 2016-07-17 (×2): qty 1

## 2016-07-17 MED ORDER — ONDANSETRON HCL 4 MG PO TABS: 4.0000 mg | ORAL_TABLET | Freq: Four times a day (QID) | ORAL | Status: DC | PRN

## 2016-07-17 MED ORDER — SODIUM CHLORIDE 0.9 % IV BOLUS (SEPSIS)
1000.0000 mL | Freq: Once | INTRAVENOUS | Status: AC
  Administered 2016-07-17: 1000 mL via INTRAVENOUS

## 2016-07-17 MED ORDER — ONDANSETRON HCL 4 MG/2ML IJ SOLN
4.0000 mg | Freq: Four times a day (QID) | INTRAMUSCULAR | Status: DC | PRN
  Administered 2016-07-17 – 2016-07-18 (×2): 4 mg via INTRAVENOUS
  Filled 2016-07-17 (×2): qty 2

## 2016-07-17 MED ORDER — SODIUM CHLORIDE 0.9 % IV SOLN
INTRAVENOUS | Status: DC
  Administered 2016-07-17 – 2016-07-18 (×2): via INTRAVENOUS

## 2016-07-17 MED ORDER — POTASSIUM CHLORIDE CRYS ER 20 MEQ PO TBCR
40.0000 meq | EXTENDED_RELEASE_TABLET | ORAL | Status: AC
  Administered 2016-07-17 – 2016-07-18 (×3): 40 meq via ORAL
  Filled 2016-07-17 (×3): qty 2

## 2016-07-17 MED ORDER — LOPERAMIDE HCL 2 MG PO CAPS: 4.0000 mg | ORAL_CAPSULE | Freq: Once | ORAL | Status: DC

## 2016-07-17 MED ORDER — AMLODIPINE BESYLATE 5 MG PO TABS
5.0000 mg | ORAL_TABLET | Freq: Every day | ORAL | Status: DC
  Administered 2016-07-17 – 2016-07-18 (×2): 5 mg via ORAL
  Filled 2016-07-17 (×2): qty 1

## 2016-07-17 NOTE — ED Notes (Signed)
Patient states she is not able to provide urine sample because she has not been able to keep anything down.

## 2016-07-17 NOTE — ED Notes (Signed)
Pt unable to keep K+ pills down.

## 2016-07-17 NOTE — Telephone Encounter (Signed)
[facility] called cancelled today's appt, she's requesting a referral to GI. She still has diarrhea,sob, abd pain, fever comes and goes no vomiting. Is it ok to set up an urgent referral to GI? She saw Dr. Fuller Plan at [facility] in 2014.

## 2016-07-17 NOTE — Telephone Encounter (Signed)
They will likely not have appt. She needs to go to ED where appropriate tests can be done.

## 2016-07-17 NOTE — Telephone Encounter (Signed)
Spoke with patient and advised that I have spoken with Dr. Renold Genta and Dr. Renold Genta advised that with her current symptoms of dehydration, fever, and still having dirrhea, she needs to go to the ED so that she can get some fluids and so that they can perform appropriate tests that she needs to have done at this point.  Patient verbalized understanding of our conversation.    I'll update the referral to GI so that they know that she is going to the ED today.

## 2016-07-17 NOTE — ED Provider Notes (Signed)
[facility] DEPT Provider Note   CSN: [id number] Arrival date & time: 07/17/16  1108     History   Chief Complaint Chief Complaint  Patient presents with  . Emesis  . Abdominal Pain    HPI Sonya Franklin is a 51 y.o. female.  Patient is a 51 year old female with no significant past medical history presenting today with 5 days of persistent diarrhea and nausea. Symptoms started Monday night. Tuesday morning she saw her doctor who did lab work which was within normal limits. She gave Zofran which improved the patient's nausea but she continues to have greater than 10 episodes of diarrhea per day. Anytime she eats anything she immediately will have watery stool. There is also been occasional bright red blood in the stool. She complains of lower abdominal pain diffusely but denies any urinary complaints. She has not eaten anything in the last 4-5 days because of this severe diarrhea. No recent antibiotics no known sick contacts. She did eat lettuce and a salad she made from food from [employer] but does not recall any bad food exposure.   The history is provided by the patient.  Emesis   Associated symptoms include abdominal pain.  Abdominal Pain   Associated symptoms include vomiting.    Past Medical History:  Diagnosis Date  . Ankylosing spondylitis ([place])   . Anxiety   . Arthritis   . Breast cancer ([place])    right breast  . Bursitis of hip, right   . Cardiac arrhythmia   . GE reflux   . Hiatal hernia   . Hypertension   . Hyperthyroidism   . Ischemic bowel disease ([place])   . Snoring 03/21/2014  . Vitamin D deficiency     Patient Active Problem List   Diagnosis Date Noted  . Ankylosing hyperostosis of thoracolumbar region 05/08/2014  . Neuropathy due to chemotherapeutic drug ([place]) 05/08/2014  . Snoring 03/21/2014  . Ankylosing spondylitis ([place]) 10/15/2013  . Abdominal pain 03/19/2012  . Personal history of goiter 03/19/2012  . Nausea vomiting and diarrhea 02/08/2012    . Shellfish allergy 06/13/2011  . History of breast cancer 06/13/2011  . Abnormal TSH 05/19/2011  . Palpitations 05/19/2011  . Fatigue 05/19/2011  . Onychomycosis 05/19/2011  . Essential hypertension, benign 09/02/2010  . Fibromyalgia 09/02/2010  . Vitamin D deficiency 09/02/2010    Past Surgical History:  Procedure Laterality Date  . ABDOMINAL HYSTERECTOMY    . BREAST LUMPECTOMY     right  . MASTECTOMY     double    OB History    No data available       Home Medications    Prior to Admission medications   Medication Sig Start Date End Date Taking? Authorizing Provider  amLODipine (NORVASC) 5 MG tablet Take 1 tablet (5 mg total) by mouth daily. 06/30/16  Yes Elby Showers, MD  ergocalciferol (VITAMIN D2) 50000 units capsule Take 1 capsule (50,000 Units total) by mouth once a week. 05/30/15  Yes Elby Showers, MD  hydrochlorothiazide (HYDRODIURIL) 25 MG tablet TAKE 1 TABLET BY MOUTH EVERY DAY AS NEEDED FOR FLUID RETENTION 08/22/14  Yes Elby Showers, MD  hyoscyamine (LEVSIN SL) 0.125 MG SL tablet DISSOLVE 1 TABLET UNDER THE TONGUE EVERY 4 HOURS AS NEEDED FOR ABDOMINAL PAIN/ CRAMPING 11/15/15  Yes Elby Showers, MD  ibuprofen (ADVIL,MOTRIN) 800 MG tablet TAKE 1 TABLET (800 MG TOTAL) BY MOUTH EVERY 8 (EIGHT) HOURS AS NEEDED FOR PAIN. 01/16/16  Yes Elby Showers,  MD  ofloxacin (OCUFLOX) 0.3 % ophthalmic solution 2 drops each eye four times a day for 5 days 03/03/16  Yes Elby Showers, MD  olopatadine (PATANOL) 0.1 % ophthalmic solution Place 1 drop into both eyes 2 (two) times daily. 06/08/16  Yes Elby Showers, MD  promethazine (PHENERGAN) 25 MG tablet Take 1 tablet (25 mg total) by mouth every 8 (eight) hours as needed for nausea or vomiting. 07/14/16  Yes Elby Showers, MD  amitriptyline (ELAVIL) 10 MG tablet One po qhs Patient not taking: Reported on 07/17/2016 12/11/14   Elby Showers, MD  olmesartan (BENICAR) 40 MG tablet Take 1 tablet (40 mg total) by mouth daily. Patient not taking:  Reported on 07/17/2016 06/30/16   Elby Showers, MD  potassium chloride SA (K-DUR,KLOR-CON) 20 MEQ tablet Take 1 tablet (20 mEq total) by mouth daily. Patient not taking: Reported on 07/17/2016 06/18/14   Elby Showers, MD    Family History Family History  Problem Relation Age of Onset  . Breast cancer Maternal Aunt   . Colitis Brother   . Diabetes Mother   . Hypertension Mother   . Hyperlipidemia Mother     Social History Social History  Substance Use Topics  . Smoking status: Never Smoker  . Smokeless tobacco: Never Used  . Alcohol use No     Allergies   Shellfish allergy and Blue dyes (parenteral)   Review of Systems Review of Systems  Gastrointestinal: Positive for abdominal pain and vomiting.  All other systems reviewed and are negative.    Physical Exam Updated Vital Signs BP 122/85 (BP Location: Left Arm)   Pulse 99   Temp 98.1 F (36.7 C) (Oral)   Resp 18   Ht 5\' 1"  (1.549 m)   Wt 175 lb (79.4 kg)   SpO2 99%   BMI 33.07 kg/m   Physical Exam  Constitutional: She is oriented to person, place, and time. She appears well-developed and well-nourished. No distress.  HENT:  Head: Normocephalic and atraumatic.  Mouth/Throat: Oropharynx is clear and moist.  Eyes: Conjunctivae and EOM are normal. Pupils are equal, round, and reactive to light.  Neck: Normal range of motion. Neck supple.  Cardiovascular: Normal rate, regular rhythm and intact distal pulses.   No murmur heard. Pulmonary/Chest: Effort normal and breath sounds normal. No respiratory distress. She has no wheezes. She has no rales.  Abdominal: Soft. She exhibits no distension. There is tenderness in the right lower quadrant, suprapubic area and left lower quadrant. There is no rebound, no guarding and no CVA tenderness.    Musculoskeletal: Normal range of motion. She exhibits no edema or tenderness.  Neurological: She is alert and oriented to person, place, and time.  Skin: Skin is warm and dry. No  rash noted. No erythema.  Psychiatric: She has a normal mood and affect. Her behavior is normal.  Nursing note and vitals reviewed.    ED Treatments / Results  Labs (all labs ordered are listed, but only abnormal results are displayed) Labs Reviewed  COMPREHENSIVE METABOLIC PANEL - Abnormal; Notable for the following:       Result Value   Potassium 3.2 (*)    Chloride 99 (*)    Glucose, Bld 116 (*)    BUN 53 (*)    Creatinine, Ser 3.13 (*)    Total Protein 9.5 (*)    GFR calc non Af Amer 16 (*)    GFR calc Af Amer 19 (*)  Anion gap 16 (*)    All other components within normal limits  GASTROINTESTINAL PANEL BY PCR, STOOL (REPLACES STOOL CULTURE)  C DIFFICILE QUICK SCREEN W PCR REFLEX  LIPASE, BLOOD  CBC  URINALYSIS, ROUTINE W REFLEX MICROSCOPIC    EKG  EKG Interpretation None       Radiology Ct Abdomen Pelvis Wo Contrast  Result Date: 07/17/2016 CLINICAL DATA:  Nausea, vomiting and diarrhea with abdominal pain since Tuesday. Unable to keep fluids down. History of colitis. EXAM: CT ABDOMEN AND PELVIS WITHOUT CONTRAST TECHNIQUE: Multidetector CT imaging of the abdomen and pelvis was performed following the standard protocol without IV contrast. COMPARISON:  12/16/2012 CT FINDINGS: Lower chest: Bilateral mastectomies with partially visualized saline implants noted. Heart is normal in size. Dependent atelectasis is identified at lung bases. Hepatobiliary: No focal liver abnormality is seen. No gallstones, gallbladder wall thickening, or biliary dilatation. Pancreas: Unremarkable. No pancreatic ductal dilatation or surrounding inflammatory changes. Spleen: Normal in size without focal abnormality. Adrenals/Urinary Tract: Normal bilateral adrenal glands and kidneys. Punctate calcifications are suggested within the interpolar aspect of the kidneys. No evidence of renal mass nor obstructive uropathy. No hydroureter nor hydronephrosis. The urinary bladder is physiologically distended  without acute abnormality. Stomach/Bowel: Contracted stomach with normal small bowel rotation. No acute bowel obstruction or inflammation. Mild submucosal fatty deposition within the distal ileum and large bowel may reflect sequela of chronic inflammatory bowel. No acute inflammation is seen. Normal-appearing appendix. There is sigmoid diverticulosis without acute diverticulitis. Vascular/Lymphatic: No significant vascular findings are present. No enlarged abdominal or pelvic lymph nodes. Reproductive: Hysterectomy.  No adnexal mass. Other: No abdominal wall hernia or abnormality. No abdominopelvic ascites. Musculoskeletal: No acute or significant osseous findings. IMPRESSION: 1. Punctate calcifications in the kidneys suspicious for nonobstructing tiny renal calculi. 2. Submucosal fat deposition within the distal ileum and large bowel which may reflect chronic changes of inflammatory bowel disease. No acute bowel inflammation or obstruction is noted. 3. Bilateral mastectomies.  No evidence metastatic disease. 4. Status post hysterectomy. Electronically Signed   By: Ashley Royalty M.D.   On: 07/17/2016 15:30    Procedures Procedures (including critical care time)  Medications Ordered in ED Medications  sodium chloride 0.9 % bolus 1,000 mL (1,000 mLs Intravenous New Bag/Given 07/17/16 1448)  ondansetron (ZOFRAN) injection 4 mg (4 mg Intravenous Given 07/17/16 1448)     Initial Impression / Assessment and Plan / ED Course  I have reviewed the triage vital signs and the nursing notes.  Pertinent labs & imaging results that were available during my care of the patient were reviewed by me and considered in my medical decision making (see chart for details).     Patient is a pleasant 51 year old female presenting today with persistent diarrhea and now acute renal failure. Creatinine 2 days ago was 0.61 and today creatinine is 3.13. This all appears to be prerenal from poor by mouth intake and persistent  diarrhea. Patient has no known bad food exposure and denies any antibiotic use which would be concerning for C. difficile. She does have lower abdominal tenderness concerning for potential diverticulitis so we'll do a CT to further evaluate. CBC with normal white blood cell count and hemoglobin. Liver enzymes within normal limits. Lipase within normal limits. GI pathogen and C. difficile panel pending. Patient given IV fluids.  3:44 PM CT without signs of diverticulitis.  Pt attempting to give a stool sample.  Will admit for AKI, hydration and diarrhea management.  Final Clinical Impressions(s) / ED  Diagnoses   Final diagnoses:  Dehydration  Diarrhea of presumed infectious origin  AKI (acute kidney injury) [facility])    New Prescriptions New Prescriptions   No medications on file     Blanchie Dessert, MD 07/17/16 1545

## 2016-07-17 NOTE — ED Notes (Signed)
ED Provider at bedside. Verbal order for 1083ml NS bolus

## 2016-07-17 NOTE — ED Triage Notes (Signed)
Pt reports n/v/d and abd pain since Tuesday. Unable to keep fluids down.

## 2016-07-17 NOTE — H&P (Signed)
History and Physical    Sonya Franklin PPI:[id number] DOB: 1965-06-03 DOA: 07/17/2016  PCP: Elby Showers, MD   I have briefly reviewed patients previous medical reports in [facility].  Patient coming from: home  Chief Complaint: nausea, diarrhea and dehydration.  HPI: Sonya Franklin is a 51 y/o woman with PMH significant for HTN, abnormal TSH, prior hx of breast cancer, GERD and obesity; who presented to ED complaining of nausea and diarrhea. Patient symptoms present for approximately 4-5 days now and failing to improved. Patient described associated anorexia, malaise and on day of admission muscle cramps. Patient also reported intermittent bright red blood while wiping herself along with diarrhea. No melanotic changes, no hematemesis and no further vomiting (even this was also present at the beginning of symptoms). Patient saw her PCP at the beginning of the week, at that time blood work was normal and patient was prescribed some phenergan and instructed to stop ARB and to keep herself hydrated. Patient failed to do that given ongoing diarrhea.   Patient denies palpitations, SOB, HA's, dysuria, hematuria, focal weakness or any other complaints.   ED Course: patient blood work demonstrated acute renal failure and hypokalemia. Patient with physical exam demonstrating dehydration (dry MM and decrease skin turgor); patient started on IVF's (2 L given for acute resuscitation). Given low potassium, started on electrolytes repletion. TRH called to observed mainly for ARF and dehydration process.    Review of Systems:  All other systems reviewed and apart from HPI, are negative.  Past Medical History:  Diagnosis Date  . Ankylosing spondylitis ([place])   . Anxiety   . Arthritis   . Breast cancer ([place])    right breast  . Bursitis of hip, right   . Cardiac arrhythmia   . GE reflux   . Hiatal hernia   . Hypertension   . Hyperthyroidism   . Ischemic bowel disease ([place])   . Snoring 03/21/2014    . Vitamin D deficiency     Past Surgical History:  Procedure Laterality Date  . ABDOMINAL HYSTERECTOMY    . BREAST LUMPECTOMY     right  . MASTECTOMY     double    Social History  reports that she has never smoked. She has never used smokeless tobacco. She reports that she does not drink alcohol or use drugs.  Allergies  Allergen Reactions  . Shellfish Allergy Anaphylaxis  . Blue Dyes ([place])     Family History  Problem Relation Age of Onset  . Breast cancer Maternal Aunt   . Colitis Brother   . Diabetes Mother   . Hypertension Mother   . Hyperlipidemia Mother      Prior to Admission medications   Medication Sig Start Date End Date Taking? Authorizing Provider  amLODipine (NORVASC) 5 MG tablet Take 1 tablet (5 mg total) by mouth daily. 06/30/16  Yes Elby Showers, MD  ergocalciferol (VITAMIN D2) 50000 units capsule Take 1 capsule (50,000 Units total) by mouth once a week. 05/30/15  Yes Elby Showers, MD  hydrochlorothiazide (HYDRODIURIL) 25 MG tablet TAKE 1 TABLET BY MOUTH EVERY DAY AS NEEDED FOR FLUID RETENTION 08/22/14  Yes Elby Showers, MD  hyoscyamine (LEVSIN SL) 0.125 MG SL tablet DISSOLVE 1 TABLET UNDER THE TONGUE EVERY 4 HOURS AS NEEDED FOR ABDOMINAL PAIN/ CRAMPING 11/15/15  Yes Elby Showers, MD  ibuprofen (ADVIL,MOTRIN) 800 MG tablet TAKE 1 TABLET (800 MG TOTAL) BY MOUTH EVERY 8 (EIGHT) HOURS AS NEEDED FOR PAIN.  01/16/16  Yes Elby Showers, MD  ofloxacin (OCUFLOX) 0.3 % ophthalmic solution 2 drops each eye four times a day for 5 days 03/03/16  Yes Elby Showers, MD  olopatadine (PATANOL) 0.1 % ophthalmic solution Place 1 drop into both eyes 2 (two) times daily. 06/08/16  Yes Elby Showers, MD  promethazine (PHENERGAN) 25 MG tablet Take 1 tablet (25 mg total) by mouth every 8 (eight) hours as needed for nausea or vomiting. 07/14/16  Yes Elby Showers, MD  amitriptyline (ELAVIL) 10 MG tablet One po qhs Patient not taking: Reported on 07/17/2016 12/11/14   Elby Showers, MD   olmesartan (BENICAR) 40 MG tablet Take 1 tablet (40 mg total) by mouth daily. Patient not taking: Reported on 07/17/2016 06/30/16   Elby Showers, MD  potassium chloride SA (K-DUR,KLOR-CON) 20 MEQ tablet Take 1 tablet (20 mEq total) by mouth daily. Patient not taking: Reported on 07/17/2016 06/18/14   Elby Showers, MD    Physical Exam: Vitals:   07/17/16 1120 07/17/16 1337 07/17/16 1625 07/17/16 1716  BP: 98/71 122/85 136/78 (!) 129/95  Pulse: (!) 112 99 100 (!) 106  Resp: 17 18 18 18   Temp: 98.1 F (36.7 C)   98.2 F (36.8 C)  TempSrc: Oral   Oral  SpO2: 94% 99% 99% 100%  Weight: 79.4 kg (175 lb)     Height: 5\' 1"  (1.549 m)       Constitutional: afebrile, in mo major distress, denies CP and SOB. Patient able to follow commands and with good insight. Eyes: PERTLA, lids and conjunctivae normal, no icterus, no nystagmus ENMT: Mucous membranes dry on exam. Posterior pharynx clear of any exudate or lesions. Normal dentition.  Neck: supple, no masses, no thyromegaly, no JVD Respiratory: clear to auscultation bilaterally, no wheezing, no crackles. Normal respiratory effort. No accessory muscle use.  Cardiovascular: tachycardic, S1 & S2 heard, sinus rhythm, no murmurs / rubs / gallops. No extremity edema. 2+ pedal pulses. No carotid bruits.  Abdomen: mild distension and bloating sensation described. Mild tenderness to palpation (mainly in her lower quadrants). No masses palpated. No hepatosplenomegaly. Bowel sounds normal. No guarding and soft appearance on exam.  Musculoskeletal: no clubbing / cyanosis. No joint deformity upper and lower extremities. Good ROM, no contractures. Normal muscle tone.  Skin: no rashes, lesions, ulcers. No induration Neurologic: CN 2-12 grossly intact. Sensation intact, DTR normal. Strength 5/5 in all 4 limbs.  Psychiatric: Normal judgment. Alert and oriented x 3. Normal mood.    Labs on Admission: I have personally reviewed following labs and imaging  studies  CBC:  Recent Labs Lab 07/14/16 1623 07/17/16 1215  WBC 7.7 9.0  NEUTROABS 6,314  --   HGB 11.9 13.7  HCT 36.4 40.1  MCV 86.5 83.9  PLT 245 211   Basic Metabolic Panel:  Recent Labs Lab 07/14/16 1623 07/17/16 1215  NA 138 137  K 3.7 3.2*  CL 106 99*  CO2 20 22  GLUCOSE 92 116*  BUN 9 53*  CREATININE 0.66 3.13*  CALCIUM 8.9 9.7  MG  --  2.5*   Liver Function Tests:  Recent Labs Lab 07/14/16 1623 07/17/16 1215  AST 15 20  ALT 14 15  ALKPHOS 79 82  BILITOT 0.4 0.7  PROT 7.3 9.5*  ALBUMIN 4.1 4.8   Urine analysis:    Component Value Date/Time   COLORURINE YELLOW 02/08/2012 2127   APPEARANCEUR CLEAR 02/08/2012 2127   LABSPEC 1.020 02/08/2012 2127  LABSPEC 1.030 10/05/2005 1410   PHURINE 5.0 02/08/2012 2127   GLUCOSEU NEGATIVE 02/08/2012 2127   HGBUR TRACE (A) 02/08/2012 2127   BILIRUBINUR neg 05/30/2015 1427   BILIRUBINUR Negative 10/05/2005 1410   KETONESUR 40 (A) 02/08/2012 2127   PROTEINUR neg 05/30/2015 1427   PROTEINUR 30 (A) 02/08/2012 2127   UROBILINOGEN 0.2 05/30/2015 1427   UROBILINOGEN 0.2 02/08/2012 2127   NITRITE neg 05/30/2015 1427   NITRITE NEGATIVE 02/08/2012 2127   LEUKOCYTESUR Negative 05/30/2015 1427   LEUKOCYTESUR Negative 10/05/2005 1410     Radiological Exams on Admission: Ct Abdomen Pelvis Wo Contrast  Result Date: 07/17/2016 CLINICAL DATA:  Nausea, vomiting and diarrhea with abdominal pain since Tuesday. Unable to keep fluids down. History of colitis. EXAM: CT ABDOMEN AND PELVIS WITHOUT CONTRAST TECHNIQUE: Multidetector CT imaging of the abdomen and pelvis was performed following the standard protocol without IV contrast. COMPARISON:  12/16/2012 CT FINDINGS: Lower chest: Bilateral mastectomies with partially visualized saline implants noted. Heart is normal in size. Dependent atelectasis is identified at lung bases. Hepatobiliary: No focal liver abnormality is seen. No gallstones, gallbladder wall thickening, or biliary  dilatation. Pancreas: Unremarkable. No pancreatic ductal dilatation or surrounding inflammatory changes. Spleen: Normal in size without focal abnormality. Adrenals/Urinary Tract: Normal bilateral adrenal glands and kidneys. Punctate calcifications are suggested within the interpolar aspect of the kidneys. No evidence of renal mass nor obstructive uropathy. No hydroureter nor hydronephrosis. The urinary bladder is physiologically distended without acute abnormality. Stomach/Bowel: Contracted stomach with normal small bowel rotation. No acute bowel obstruction or inflammation. Mild submucosal fatty deposition within the distal ileum and large bowel may reflect sequela of chronic inflammatory bowel. No acute inflammation is seen. Normal-appearing appendix. There is sigmoid diverticulosis without acute diverticulitis. Vascular/Lymphatic: No significant vascular findings are present. No enlarged abdominal or pelvic lymph nodes. Reproductive: Hysterectomy.  No adnexal mass. Other: No abdominal wall hernia or abnormality. No abdominopelvic ascites. Musculoskeletal: No acute or significant osseous findings. IMPRESSION: 1. Punctate calcifications in the kidneys suspicious for nonobstructing tiny renal calculi. 2. Submucosal fat deposition within the distal ileum and large bowel which may reflect chronic changes of inflammatory bowel disease. No acute bowel inflammation or obstruction is noted. 3. Bilateral mastectomies.  No evidence metastatic disease. 4. Status post hysterectomy. Electronically Signed   By: Ashley Royalty M.D.   On: 07/17/2016 15:30    EKG:  None   Assessment/Plan 1-ARF (acute renal failure) ([place]): appears to be pre-renal in nature due to dehydration and with some co-adjuvant use of nephrotoxic agents. -will plac ein observation -will start IVF's resuscitation -2L bolus given in ED and subsequent NS at 125 cc/hr ordered -will hold nephrotoxic agents -will check UA -will check TSH and follow renal  function trend   2-Nausea, diarrhea and abd pain: -most liekly secondary to viral vs bacterial gastroenteritis  -patient endorses eating some food recently recalled -will check for C. Diff and also GI enteric pathogen panel  -will provide fluid resuscitation and supportive care -continue Levsin for cramps -will follow clinical response  3-Abnormal TSH -will check TSH  4-Dehydration: -due to poor intake and GI loses -will provide IVF's and supportive care  5-Hypokalemia -will replete potassium and check Mg and Phosphorus   6-Hyperglycemia -will check A1C  7-Obesity -Body mass index is 33.07 kg/m. -low calorie diet and exercise discussed with patient   8-HTN -will continue norvasc   Time: 50 minutes   DVT prophylaxis: Heparin   Code Status: Full Family Communication: father and brother at  bedside   Disposition Plan: home when medically stable. (most likely in -2 days) Consults called: none  Admission status: place in observation, LOS < 2 midnights, med-surg bed.    Barton Dubois MD [facility] Pager [phone]  If 7PM-7AM, please contact night-coverage [email or web address] Password [facility]  07/17/2016, 6:35 PM

## 2016-07-18 ENCOUNTER — Other Ambulatory Visit: Payer: Self-pay | Admitting: Internal Medicine

## 2016-07-18 DIAGNOSIS — R739 Hyperglycemia, unspecified: Secondary | ICD-10-CM | POA: Diagnosis not present

## 2016-07-18 DIAGNOSIS — A045 Campylobacter enteritis: Secondary | ICD-10-CM

## 2016-07-18 DIAGNOSIS — R7309 Other abnormal glucose: Secondary | ICD-10-CM

## 2016-07-18 DIAGNOSIS — E860 Dehydration: Secondary | ICD-10-CM | POA: Diagnosis not present

## 2016-07-18 DIAGNOSIS — R197 Diarrhea, unspecified: Secondary | ICD-10-CM | POA: Diagnosis not present

## 2016-07-18 DIAGNOSIS — R946 Abnormal results of thyroid function studies: Secondary | ICD-10-CM | POA: Diagnosis not present

## 2016-07-18 DIAGNOSIS — E876 Hypokalemia: Secondary | ICD-10-CM | POA: Diagnosis not present

## 2016-07-18 DIAGNOSIS — E669 Obesity, unspecified: Secondary | ICD-10-CM

## 2016-07-18 LAB — CBC
HEMATOCRIT: 32 % — AB (ref 36.0–46.0)
Hemoglobin: 10.5 g/dL — ABNORMAL LOW (ref 12.0–15.0)
MCH: 28.1 pg (ref 26.0–34.0)
MCHC: 32.8 g/dL (ref 30.0–36.0)
MCV: 85.6 fL (ref 78.0–100.0)
PLATELETS: 242 10*3/uL (ref 150–400)
RBC: 3.74 MIL/uL — ABNORMAL LOW (ref 3.87–5.11)
RDW: 13.6 % (ref 11.5–15.5)
WBC: 6.4 10*3/uL (ref 4.0–10.5)

## 2016-07-18 LAB — BASIC METABOLIC PANEL
Anion gap: 7 (ref 5–15)
BUN: 28 mg/dL — AB (ref 6–20)
CHLORIDE: 115 mmol/L — AB (ref 101–111)
CO2: 20 mmol/L — AB (ref 22–32)
CREATININE: 0.92 mg/dL (ref 0.44–1.00)
Calcium: 8.3 mg/dL — ABNORMAL LOW (ref 8.9–10.3)
GFR calc Af Amer: >60 mL/min (ref >60)
GFR calc non Af Amer: >60 mL/min (ref >60)
GLUCOSE: 100 mg/dL — AB (ref 65–99)
POTASSIUM: 4.3 mmol/L (ref 3.5–5.1)
Sodium: 142 mmol/L (ref 135–145)

## 2016-07-18 LAB — HIV ANTIBODY (ROUTINE TESTING W REFLEX): HIV Screen 4th Generation wRfx: NONREACTIVE

## 2016-07-18 LAB — MAGNESIUM: MAGNESIUM: 2.1 mg/dL (ref 1.7–2.4)

## 2016-07-18 LAB — GASTROINTESTINAL PANEL BY PCR, STOOL (REPLACES STOOL CULTURE)
ASTROVIRUS: NOT DETECTED
Adenovirus F40/41: NOT DETECTED
CAMPYLOBACTER SPECIES: DETECTED — AB
CYCLOSPORA CAYETANENSIS: NOT DETECTED
Cryptosporidium: NOT DETECTED
ENTEROTOXIGENIC E COLI (ETEC): NOT DETECTED
Entamoeba histolytica: NOT DETECTED
Enteroaggregative E coli (EAEC): NOT DETECTED
Enteropathogenic E coli (EPEC): NOT DETECTED
Giardia lamblia: NOT DETECTED
Norovirus GI/GII: NOT DETECTED
PLESIMONAS SHIGELLOIDES: NOT DETECTED
Rotavirus A: NOT DETECTED
SAPOVIRUS (I, II, IV, AND V): NOT DETECTED
SHIGA LIKE TOXIN PRODUCING E COLI (STEC): NOT DETECTED
Salmonella species: NOT DETECTED
Shigella/Enteroinvasive E coli (EIEC): NOT DETECTED
VIBRIO SPECIES: NOT DETECTED
Vibrio cholerae: NOT DETECTED
Yersinia enterocolitica: NOT DETECTED

## 2016-07-18 LAB — C DIFFICILE QUICK SCREEN W PCR REFLEX
C DIFFICILE (CDIFF) INTERP: NOT DETECTED
C DIFFICLE (CDIFF) ANTIGEN: NEGATIVE
C Diff toxin: NEGATIVE

## 2016-07-18 MED ORDER — SACCHAROMYCES BOULARDII 250 MG PO CAPS
250.0000 mg | ORAL_CAPSULE | Freq: Two times a day (BID) | ORAL | Status: DC
  Administered 2016-07-18: 250 mg via ORAL
  Filled 2016-07-18: qty 1

## 2016-07-18 MED ORDER — DIPHENOXYLATE-ATROPINE 2.5-0.025 MG PO TABS: 1.0000 | ORAL_TABLET | Freq: Four times a day (QID) | ORAL | 0 refills | Status: DC | PRN

## 2016-07-18 MED ORDER — PANTOPRAZOLE SODIUM 40 MG PO TBEC: 40.0000 mg | DELAYED_RELEASE_TABLET | Freq: Every day | ORAL | 0 refills | Status: DC

## 2016-07-18 MED ORDER — SACCHAROMYCES BOULARDII 250 MG PO CAPS: 250.0000 mg | ORAL_CAPSULE | Freq: Two times a day (BID) | ORAL | 0 refills | Status: DC

## 2016-07-18 NOTE — Discharge Summary (Addendum)
Physician Discharge Summary  Sonya Franklin YQM:[id number] DOB: January 06, 1966 DOA: 07/17/2016  PCP: Elby Showers, MD  Admit date: 07/17/2016 Discharge date: 07/18/2016  Time spent: 35 minutes  Recommendations for Outpatient Follow-up:  1. Repeat BMET to follow up electrolytes and renal function  2. Reassess BP and adjust medications as needed  3. Please follow final results for A1C (patient with hyperglycemia during this admission) 4. GI pathogen back and showing campylobacter infection; if patient symptoms persist will need treatment with fluoroquinolones or zithromax. Please follow symptoms progression/resolution.   5. Recheck thyroid panel in 3 weeks and if needed initiate treatment.   Discharge Diagnoses:  Principal Problem:   ARF (acute renal failure) (HCC) Active Problems:   Abnormal TSH   Dehydration   Hypokalemia   Hyperglycemia   Obesity   Diarrhea of presumed infectious origin   Discharge Condition: stable and improved. Patient discharged home and instructed to follow up with PCP in 10 days.  Diet recommendation: heart healthy/low calorie diet   Filed Weights   07/17/16 1120  Weight: 79.4 kg (175 lb)    History of present illness:  51 y/o woman with PMH significant for HTN, abnormal TSH, prior hx of breast cancer, GERD and obesity; who presented to ED complaining of nausea and diarrhea. Patient symptoms present for approximately 4-5 days now and failing to improved. Patient described associated anorexia, malaise and on day of admission muscle cramps. Patient also reported intermittent bright red blood while wiping herself along with diarrhea. No melanotic changes, no hematemesis and no further vomiting (even this was also present at the beginning of symptoms). Patient saw her PCP at the beginning of the week, at that time blood work was normal and patient was prescribed some phenergan and instructed to stop ARB and to keep herself hydrated. Patient failed to do that given  ongoing diarrhea.   Patient denies palpitations, SOB, HA's, dysuria, hematuria, focal weakness or any other complaints.   Hospital Course:  1-ARF (acute renal failure) ([place]): appears to be pre-renal in nature due to dehydration and with some co-adjuvant use of nephrotoxic agents. -patient was aggressively re-hydrated and her renal function returned to normal  -advise to minimize/avoid nephrotoxic agents -UA w/o infection  -will recommend BMET at follow up to reassess renal function trend   2-Nausea, diarrhea and abd pain: -most liekly secondary to campylobacter gastroenteritis  -resolving now -C. Diff neg -patient discharge on florastor and PRN lomotil -patient currently able to keep things down better. -final results from GI pathogen panel demonstrated Campylobacter; at this moment no need for antibiotics. Will continue supportive care  -patient advise to keep herself well hydrated  3-Abnormal TSH -tsh 0.100 -recent Free T4 WNL -continue outpatient follow up and decision for treatment is needed later on   4-Dehydration: -due to poor intake and GI loses -resolved with IVF's  5-Hypokalemia -repleted and WNL at discharge -was previously using daily maintenance potassium while using HCTZ -will recommend to resume daily potassium while diarrhea continues for now -Mg and phosphorus WNL  6-Hyperglycemia -A1C was checked; final report pending. Will ask PCP to follow on this result -repeat fasting CBG 100  7-Obesity -Body mass index is 33.07 kg/m. -low calorie diet and exercise discussed with patient   8-HTN -will continue norvasc -heart healthy diet recommended   Procedures:  See below for x-ray reports   Consultations:  None   Discharge Exam: Vitals:   07/17/16 2051 07/18/16 0501  BP: 120/65 125/71  Pulse: (!) 101 83  Resp: 18 17  Temp: 97.6 F (36.4 C) 98 F (36.7 C)    General: afebrile, no CP, no SOB, reports still loose stools, but less frequent.  No nausea, no vomiting and tolerating diet. Cardiovascular: S1 and S2, no rubs, no gallops  Respiratory: CTA bilaterally  Abd: soft, NT, ND, positive BS Extremities:no edema or cyanosis    Discharge Instructions   Discharge Instructions    Diet - low sodium heart healthy    Complete by:  As directed    Discharge instructions    Complete by:  As directed    Take medications as prescribed  Keep yourself well hydrated  Please arrange follow up with PCP in 10 days  Follow low calorie and heart healthy diet     Current Discharge Medication List    START taking these medications   Details  diphenoxylate-atropine (LOMOTIL) 2.5-0.025 MG tablet Take 1 tablet by mouth every 6 (six) hours as needed for diarrhea or loose stools. Qty: 30 tablet, Refills: 0    pantoprazole (PROTONIX) 40 MG tablet Take 1 tablet (40 mg total) by mouth daily. Qty: 30 tablet, Refills: 0    saccharomyces boulardii (FLORASTOR) 250 MG capsule Take 1 capsule (250 mg total) by mouth 2 (two) times daily. Qty: 40 capsule, Refills: 0      CONTINUE these medications which have NOT CHANGED   Details  amLODipine (NORVASC) 5 MG tablet Take 1 tablet (5 mg total) by mouth daily. Qty: 30 tablet, Refills: 5    ergocalciferol (VITAMIN D2) 50000 units capsule Take 1 capsule (50,000 Units total) by mouth once a week. Qty: 4 capsule, Refills: 3    hyoscyamine (LEVSIN SL) 0.125 MG SL tablet DISSOLVE 1 TABLET UNDER THE TONGUE EVERY 4 HOURS AS NEEDED FOR ABDOMINAL PAIN/ CRAMPING Qty: 90 tablet, Refills: 0   Associated Diagnoses: Generalized abdominal pain    ofloxacin (OCUFLOX) 0.3 % ophthalmic solution 2 drops each eye four times a day for 5 days Qty: 5 mL, Refills: 0    olopatadine (PATANOL) 0.1 % ophthalmic solution Place 1 drop into both eyes 2 (two) times daily. Qty: 5 mL, Refills: 12    promethazine (PHENERGAN) 25 MG tablet Take 1 tablet (25 mg total) by mouth every 8 (eight) hours as needed for nausea or  vomiting. Qty: 20 tablet, Refills: 0    ibuprofen (ADVIL,MOTRIN) 800 MG tablet TAKE 1 TABLET (800 MG TOTAL) BY MOUTH EVERY 8 (EIGHT) HOURS AS NEEDED FOR PAIN. Qty: 180 tablet, Refills: 0    potassium chloride SA (K-DUR,KLOR-CON) 20 MEQ tablet Take 1 tablet (20 mEq total) by mouth daily. Qty: 30 tablet, Refills: 1      STOP taking these medications     hydrochlorothiazide (HYDRODIURIL) 25 MG tablet      amitriptyline (ELAVIL) 10 MG tablet      olmesartan (BENICAR) 40 MG tablet        Allergies  Allergen Reactions  . Shellfish Allergy Anaphylaxis  . Blue Dyes (Parenteral)    Follow-up Information    Baxley, Cresenciano Lick, MD. Schedule an appointment as soon as possible for a visit in 10 day(s).   Specialty:  Internal Medicine Contact information: [address] [place] [address] [phone]           The results of significant diagnostics from this hospitalization (including imaging, microbiology, ancillary and laboratory) are listed below for reference.    Significant Diagnostic Studies: Ct Abdomen Pelvis Wo Contrast  Result Date: 07/17/2016 CLINICAL DATA:  Nausea,  vomiting and diarrhea with abdominal pain since Tuesday. Unable to keep fluids down. History of colitis. EXAM: CT ABDOMEN AND PELVIS WITHOUT CONTRAST TECHNIQUE: Multidetector CT imaging of the abdomen and pelvis was performed following the standard protocol without IV contrast. COMPARISON:  12/16/2012 CT FINDINGS: Lower chest: Bilateral mastectomies with partially visualized saline implants noted. Heart is normal in size. Dependent atelectasis is identified at lung bases. Hepatobiliary: No focal liver abnormality is seen. No gallstones, gallbladder wall thickening, or biliary dilatation. Pancreas: Unremarkable. No pancreatic ductal dilatation or surrounding inflammatory changes. Spleen: Normal in size without focal abnormality. Adrenals/Urinary Tract: Normal bilateral adrenal glands and kidneys. Punctate  calcifications are suggested within the interpolar aspect of the kidneys. No evidence of renal mass nor obstructive uropathy. No hydroureter nor hydronephrosis. The urinary bladder is physiologically distended without acute abnormality. Stomach/Bowel: Contracted stomach with normal small bowel rotation. No acute bowel obstruction or inflammation. Mild submucosal fatty deposition within the distal ileum and large bowel may reflect sequela of chronic inflammatory bowel. No acute inflammation is seen. Normal-appearing appendix. There is sigmoid diverticulosis without acute diverticulitis. Vascular/Lymphatic: No significant vascular findings are present. No enlarged abdominal or pelvic lymph nodes. Reproductive: Hysterectomy.  No adnexal mass. Other: No abdominal wall hernia or abnormality. No abdominopelvic ascites. Musculoskeletal: No acute or significant osseous findings. IMPRESSION: 1. Punctate calcifications in the kidneys suspicious for nonobstructing tiny renal calculi. 2. Submucosal fat deposition within the distal ileum and large bowel which may reflect chronic changes of inflammatory bowel disease. No acute bowel inflammation or obstruction is noted. 3. Bilateral mastectomies.  No evidence metastatic disease. 4. Status post hysterectomy. Electronically Signed   By: Ashley Royalty M.D.   On: 07/17/2016 15:30   Dg Cervical Spine Complete  Result Date: 07/01/2016 CLINICAL DATA:  Neck pain EXAM: CERVICAL SPINE - COMPLETE 4+ VIEW COMPARISON:  None. FINDINGS: There is no evidence of cervical spine fracture or prevertebral soft tissue swelling. Alignment is normal. No other significant bone abnormalities are identified. Calcification right neck likely atherosclerotic calcification right carotid. IMPRESSION: Negative cervical spine radiographs. Right carotid calcification Electronically Signed   By: Franchot Gallo M.D.   On: 07/01/2016 07:47   Dg Lumbar Spine Complete  Result Date: 07/01/2016 CLINICAL DATA:   Bilateral low back pain with sciatica EXAM: LUMBAR SPINE - COMPLETE 4+ VIEW COMPARISON:  CT abdomen pelvis 12/16/2012 FINDINGS: 7 mm anterolisthesis C7-T1 has developed since the prior study. No pars defect seen. This is likely due to disc and facet degeneration. Remaining alignment normal. Remaining disc spaces normal. No fracture or mass. IMPRESSION: 7 mm anterolisthesis L5-S1 has developed since 2014 and is most consistent with progressive disc and facet degeneration. Other levels are normal. Electronically Signed   By: Franchot Gallo M.D.   On: 07/01/2016 07:49    Microbiology: Recent Results (from the past 240 hour(s))  C difficile quick scan w PCR reflex     Status: None   Collection Time: 07/17/16  1:59 PM  Result Value Ref Range Status   C Diff antigen NEGATIVE NEGATIVE Final   C Diff toxin NEGATIVE NEGATIVE Final   C Diff interpretation No C. difficile detected.  Final     Labs: Basic Metabolic Panel:  Recent Labs Lab 07/14/16 1623 07/17/16 1215 07/17/16 2232 07/18/16 0634  NA 138 137  --  142  K 3.7 3.2*  --  4.3  CL 106 99*  --  115*  CO2 20 22  --  20*  GLUCOSE 92 116*  --  100*  BUN 9 53*  --  28*  CREATININE 0.66 3.13*  --  0.92  CALCIUM 8.9 9.7  --  8.3*  MG  --  2.5*  --  2.1  PHOS  --   --  3.8  --    Liver Function Tests:  Recent Labs Lab 07/14/16 1623 07/17/16 1215  AST 15 20  ALT 14 15  ALKPHOS 79 82  BILITOT 0.4 0.7  PROT 7.3 9.5*  ALBUMIN 4.1 4.8    Recent Labs Lab 07/17/16 1215  LIPASE 24   CBC:  Recent Labs Lab 07/14/16 1623 07/17/16 1215 07/18/16 0634  WBC 7.7 9.0 6.4  NEUTROABS 6,314  --   --   HGB 11.9 13.7 10.5*  HCT 36.4 40.1 32.0*  MCV 86.5 83.9 85.6  PLT 245 320 242     Signed:  Barton Dubois MD.  [facility] 07/18/2016, 4:12 PM

## 2016-07-18 NOTE — Progress Notes (Signed)
Pt left at this time. Pt alert, oriented, and without c/o. Discharge instructions/prescriptions given/explained with pt verbalizing understanding.

## 2016-07-18 NOTE — Telephone Encounter (Signed)
Telephone call to patient. She is hospitalized at [facility] with volume depletion and diarrhea. Feeling much better and her abnormal values have now normalized. She says she may be discharged soon.

## 2016-07-18 NOTE — Care Management Note (Signed)
[place] NOTIFICATION   Patient Details  Name: Sonya Franklin MRN: [id number] Date of Birth: Nov 07, 1965   Medicare Observation Status Notification Given:   yes    Norina Buzzard, RN 07/18/2016, 10:39 AM

## 2016-07-19 LAB — HEMOGLOBIN A1C
HEMOGLOBIN A1C: 5.8 % — AB (ref 4.8–5.6)
Mean Plasma Glucose: 120 mg/dL

## 2016-07-21 ENCOUNTER — Encounter: Payer: Self-pay | Admitting: Internal Medicine

## 2016-07-21 ENCOUNTER — Ambulatory Visit (INDEPENDENT_AMBULATORY_CARE_PROVIDER_SITE_OTHER): Payer: Medicare Other | Admitting: Internal Medicine

## 2016-07-21 VITALS — BP 120/88 | HR 80 | Temp 98.1°F | Wt 176.0 lb

## 2016-07-21 DIAGNOSIS — E860 Dehydration: Secondary | ICD-10-CM

## 2016-07-21 DIAGNOSIS — M459 Ankylosing spondylitis of unspecified sites in spine: Secondary | ICD-10-CM

## 2016-07-21 DIAGNOSIS — R197 Diarrhea, unspecified: Secondary | ICD-10-CM | POA: Diagnosis not present

## 2016-07-21 DIAGNOSIS — N179 Acute kidney failure, unspecified: Secondary | ICD-10-CM

## 2016-07-21 DIAGNOSIS — A045 Campylobacter enteritis: Secondary | ICD-10-CM | POA: Diagnosis not present

## 2016-07-21 DIAGNOSIS — I10 Essential (primary) hypertension: Secondary | ICD-10-CM | POA: Diagnosis not present

## 2016-07-21 LAB — BASIC METABOLIC PANEL
BUN: 9 mg/dL (ref 7–25)
CALCIUM: 9 mg/dL (ref 8.6–10.4)
CO2: 28 mmol/L (ref 20–31)
CREATININE: 0.61 mg/dL (ref 0.50–1.05)
Chloride: 106 mmol/L (ref 98–110)
GLUCOSE: 91 mg/dL (ref 65–99)
POTASSIUM: 4.3 mmol/L (ref 3.5–5.3)
Sodium: 142 mmol/L (ref 135–146)

## 2016-07-21 MED ORDER — AZITHROMYCIN 500 MG PO TABS: 500.0000 mg | ORAL_TABLET | Freq: Every day | ORAL | 0 refills | Status: DC

## 2016-07-21 NOTE — Progress Notes (Signed)
   Subjective:    Patient ID: Sonya Franklin, female    DOB: Jun 22, 1965, 51 y.o.   MRN: [id number]  HPI 51 year old Female discharged Saturday from hospital after being admitted with volume depletion, elevated serum creatinine and persistent diarrhea. Stool studies revealed Campylobacter. She's not sure where she got this. 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 She also ate some lettuce and tomato prior to becoming ill. Hemoglobin was 10.5 g on day of discharge on May 5 and had been 13.7 g  on admission. The low CBC on May 5 was likely due to being rehydrated and also with blood drawing. She usually has a hemoglobin in the 11 range. She was seen with diarrhea as an outpatient on May 1. She was given Phenergan and asked to stay well-hydrated but she called back saying she was much worse on May 4 and was referred to the emergency department. Hemoglobin was 11.4 g in December 2017  On admission her potassium was 3.2, chloride was 99, sodium 137, creatinine 3.13 and BUN 53. At discharge creatinine was 0.92 and BUN 28. Potassium was 4.3.  Marland Kitchen She was found to have some mild glucose intolerance of 5.8%. She has a history of impaired glucose tolerance. Hemoglobin A1c was 5.4% in December and 5.8% year ago.    She has a history of ankylosing spondylitis. Continues to have hip and low back pain. Reviewed with her LS-spine films showing anterior listhesis L5-S1. We may want to proceed with MRI of the LS-spine once she recovers from diarrhea illness.  She still having some diarrhea. Basic metabolic panel drawn today.  She does not want to take Benicar. She did not like the way it made her feel.  Review of Systems see above  She had plain films of LS-spine recently showing anterior listhesis at a couple of levels. This was explained to her today. She thinks she may want to have an MRI of LS-spine in the near future. We will try to get this approved.     Objective:   Physical Exam Not examined. She  is afebrile. Blood pressure is 120/88 but has not taken diuretic.       Assessment & Plan:  Campylobacter infection-still with diarrhea  History of ankylosing spondylitis  Chronic low back pain and hip pain  Essential hypertension-diastolic blood pressure elevated off Benicar and diuretic  Plan: Resume diuretic. Hold Benicar. Return in 4 weeks. Try to get MRI of LS-spine approved. Treat with azithromycin 500 mg daily for 3 days. Continue amlodipine 5 mg daily.

## 2016-07-21 NOTE — Patient Instructions (Addendum)
Continue to stay well hydrated. Take azithromycin 500 mg daily for 3 days. Follow-up in 4 weeks with regard to blood pressure. Hold Benicar. Continue diuretic. Continue amlodipine.

## 2016-07-31 ENCOUNTER — Telehealth: Payer: Self-pay

## 2016-07-31 DIAGNOSIS — M459 Ankylosing spondylitis of unspecified sites in spine: Secondary | ICD-10-CM

## 2016-07-31 DIAGNOSIS — M549 Dorsalgia, unspecified: Secondary | ICD-10-CM

## 2016-07-31 NOTE — Telephone Encounter (Signed)
Put in MRI order and Sharyn Lull is doing PA

## 2016-08-04 ENCOUNTER — Other Ambulatory Visit: Payer: Self-pay | Admitting: Internal Medicine

## 2016-08-18 ENCOUNTER — Encounter: Payer: Self-pay | Admitting: Internal Medicine

## 2016-08-18 ENCOUNTER — Ambulatory Visit (INDEPENDENT_AMBULATORY_CARE_PROVIDER_SITE_OTHER): Payer: Medicare Other | Admitting: Internal Medicine

## 2016-08-18 ENCOUNTER — Ambulatory Visit
Admission: RE | Admit: 2016-08-18 | Discharge: 2016-08-18 | Disposition: A | Discharge status: Home / Self Care | Payer: Medicare Other | Source: Ambulatory Visit | Attending: Internal Medicine | Admitting: Internal Medicine

## 2016-08-18 VITALS — BP 118/78 | HR 73 | Temp 98.4°F | Wt 176.0 lb

## 2016-08-18 DIAGNOSIS — M459 Ankylosing spondylitis of unspecified sites in spine: Secondary | ICD-10-CM | POA: Diagnosis not present

## 2016-08-18 DIAGNOSIS — I10 Essential (primary) hypertension: Secondary | ICD-10-CM | POA: Diagnosis not present

## 2016-08-18 DIAGNOSIS — M5126 Other intervertebral disc displacement, lumbar region: Secondary | ICD-10-CM | POA: Diagnosis not present

## 2016-08-18 MED ORDER — METHYLPREDNISOLONE ACETATE 80 MG/ML IJ SUSP
80.0000 mg | Freq: Once | INTRAMUSCULAR | Status: AC
  Administered 2016-08-18: 80 mg via INTRAMUSCULAR

## 2016-08-18 NOTE — Patient Instructions (Signed)
Depo-Medrol given for ankylosing spondylitis. Does not want to be on steroids were see rheumatologist. Continue Norvasc for hypertension and return in 6 months or as needed.

## 2016-08-18 NOTE — Progress Notes (Signed)
   Subjective:    Patient ID: Sonya Franklin, female    DOB: 09/16/1965, 51 y.o.   MRN: [id number]  HPI   51 year old Female here today to follow-up on hypertension. Recently was hospitalized for volume depletion secondary to Campylobacter infection. [facility] called to question where she may have gotten this infection but they never made a determination according to the patient.  Persistent issues with ankylosing spondylitis and was injection of Depo-Medrol. Doesn't want to take oral steroids and doesn't want to see rheumatologist.  No symptoms with diarrhea at the present time.  Did not like taking Benicar. Currently taking amlodipine 5 mg daily for hypertension and blood pressure is well controlled.    Review of Systems see above     Objective:   Physical Exam  Not examined. Spent 20 minutes speaking with her about these issues.    Assessment & Plan:  Essential hypertension  Ankylosing spondylitis  Plan: Depo-Medrol 80 mg IM given for pain from ankylosing spondylitis. Continue Norvasc 5 mg daily. Return in 6 months or as needed.

## 2016-08-20 ENCOUNTER — Encounter: Payer: Self-pay | Admitting: Internal Medicine

## 2016-08-24 ENCOUNTER — Other Ambulatory Visit: Payer: Self-pay | Admitting: Internal Medicine

## 2016-09-30 ENCOUNTER — Other Ambulatory Visit: Payer: Self-pay | Admitting: Internal Medicine

## 2016-10-01 NOTE — Telephone Encounter (Signed)
LMTCB

## 2016-10-01 NOTE — Telephone Encounter (Signed)
After 12 weeks of this pt is supposed to take 2000 units daily. Please call pharmacy and explain and call pt to explain

## 2016-12-30 ENCOUNTER — Other Ambulatory Visit: Payer: Self-pay | Admitting: Internal Medicine

## 2017-01-15 ENCOUNTER — Encounter: Payer: Self-pay | Admitting: Internal Medicine

## 2017-01-15 ENCOUNTER — Ambulatory Visit (INDEPENDENT_AMBULATORY_CARE_PROVIDER_SITE_OTHER): Payer: Medicare Other | Admitting: Internal Medicine

## 2017-01-15 VITALS — BP 122/80 | HR 82 | Temp 97.9°F | Wt 177.0 lb

## 2017-01-15 DIAGNOSIS — I10 Essential (primary) hypertension: Secondary | ICD-10-CM

## 2017-01-15 DIAGNOSIS — M450 Ankylosing spondylitis of multiple sites in spine: Secondary | ICD-10-CM | POA: Diagnosis not present

## 2017-01-15 DIAGNOSIS — F4321 Adjustment disorder with depressed mood: Secondary | ICD-10-CM | POA: Diagnosis not present

## 2017-01-15 DIAGNOSIS — Z853 Personal history of malignant neoplasm of breast: Secondary | ICD-10-CM | POA: Diagnosis not present

## 2017-01-15 MED ORDER — METHYLPREDNISOLONE ACETATE 80 MG/ML IJ SUSP
80.0000 mg | Freq: Once | INTRAMUSCULAR | Status: AC
  Administered 2017-01-15: 80 mg via INTRAMUSCULAR

## 2017-01-15 NOTE — Progress Notes (Signed)
   Subjective:    Patient ID: Sonya Franklin, female    DOB: 07-Mar-1966, 51 y.o.   MRN: [id number]  HPI 51 year old with Black Female in today complaining of worsening symptoms of ankylosing spondylitis with generalized body aches and persistent back issues.  Her long-term boyfriend passed away of complications of cirrhosis of the liver a few weeks ago.  She is appropriately grieving.  We talked about this at length today.  She seems to be doing okay.  She does not like taking oral steroids but says she gets relief with Depo-Medrol 80 mg IM.  She does not want to be on other medication for ankylosing spondylitis.  History of essential hypertension.  Some concern about possible recurrence of breast cancer which she had in the remote past.  Chest x-ray ordered.    Review of Systems see above     Objective:   Physical Exam  Tender in SI joints bilaterally.  Chest clear.  Cardiac exam regular rate and rhythm normal S1 and S2.  Extremities without edema      Assessment & Plan:  History of breast cancer  Ankylosing spondylitis  Arthralgias  Essential hypertension-stable  Grief reaction -stable Plan: Depo-Medrol 80 mg IM.  To have chest x-ray.

## 2017-01-20 ENCOUNTER — Ambulatory Visit
Admission: RE | Admit: 2017-01-20 | Discharge: 2017-01-20 | Disposition: A | Discharge status: Home / Self Care | Payer: Medicare Other | Source: Ambulatory Visit | Attending: Internal Medicine | Admitting: Internal Medicine

## 2017-01-20 DIAGNOSIS — M450 Ankylosing spondylitis of multiple sites in spine: Secondary | ICD-10-CM

## 2017-01-20 DIAGNOSIS — Z853 Personal history of malignant neoplasm of breast: Secondary | ICD-10-CM | POA: Diagnosis not present

## 2017-01-20 DIAGNOSIS — R079 Chest pain, unspecified: Secondary | ICD-10-CM | POA: Diagnosis not present

## 2017-01-21 ENCOUNTER — Telehealth: Payer: Self-pay

## 2017-01-21 NOTE — Telephone Encounter (Signed)
Pt is aware.  

## 2017-01-21 NOTE — Telephone Encounter (Signed)
-----   Message from Elby Showers, MD sent at 01/21/2017  9:41 AM EST ----- CXR is normal. No tumor or rib fracture

## 2017-01-30 NOTE — Patient Instructions (Addendum)
Depo-Medrol 80 mg IM given.  To have chest x-ray in the near future.  Continue same antihypertensive medication.

## 2017-02-10 DIAGNOSIS — Z853 Personal history of malignant neoplasm of breast: Secondary | ICD-10-CM | POA: Diagnosis not present

## 2017-02-16 DIAGNOSIS — H524 Presbyopia: Secondary | ICD-10-CM | POA: Diagnosis not present

## 2017-02-16 DIAGNOSIS — H5213 Myopia, bilateral: Secondary | ICD-10-CM | POA: Diagnosis not present

## 2017-04-14 ENCOUNTER — Other Ambulatory Visit: Payer: Self-pay

## 2017-04-14 MED ORDER — AMLODIPINE BESYLATE 5 MG PO TABS: 5.0000 mg | ORAL_TABLET | Freq: Every day | ORAL | 3 refills | Status: DC

## 2017-05-18 ENCOUNTER — Other Ambulatory Visit: Payer: Self-pay | Admitting: Internal Medicine

## 2017-05-18 DIAGNOSIS — R7302 Impaired glucose tolerance (oral): Secondary | ICD-10-CM

## 2017-05-18 DIAGNOSIS — Z0000 Encounter for general adult medical examination without abnormal findings: Secondary | ICD-10-CM

## 2017-05-18 DIAGNOSIS — E559 Vitamin D deficiency, unspecified: Secondary | ICD-10-CM

## 2017-05-18 DIAGNOSIS — R7989 Other specified abnormal findings of blood chemistry: Secondary | ICD-10-CM

## 2017-05-18 DIAGNOSIS — E7800 Pure hypercholesterolemia, unspecified: Secondary | ICD-10-CM

## 2017-05-18 DIAGNOSIS — I10 Essential (primary) hypertension: Secondary | ICD-10-CM

## 2017-05-22 ENCOUNTER — Other Ambulatory Visit: Payer: Self-pay | Admitting: Internal Medicine

## 2017-05-28 ENCOUNTER — Other Ambulatory Visit: Payer: Medicare Other | Admitting: Internal Medicine

## 2017-05-28 DIAGNOSIS — R7989 Other specified abnormal findings of blood chemistry: Secondary | ICD-10-CM

## 2017-05-28 DIAGNOSIS — Z0000 Encounter for general adult medical examination without abnormal findings: Secondary | ICD-10-CM

## 2017-05-28 DIAGNOSIS — I10 Essential (primary) hypertension: Secondary | ICD-10-CM

## 2017-05-28 DIAGNOSIS — E7800 Pure hypercholesterolemia, unspecified: Secondary | ICD-10-CM

## 2017-05-28 DIAGNOSIS — E559 Vitamin D deficiency, unspecified: Secondary | ICD-10-CM

## 2017-05-28 DIAGNOSIS — R7302 Impaired glucose tolerance (oral): Secondary | ICD-10-CM | POA: Diagnosis not present

## 2017-05-29 LAB — HEMOGLOBIN A1C
EAG (MMOL/L): 6.6 (calc)
Hgb A1c MFr Bld: 5.8 % of total Hgb — ABNORMAL HIGH (ref <5.7)
Mean Plasma Glucose: 120 (calc)

## 2017-05-29 LAB — CBC WITH DIFFERENTIAL/PLATELET
Basophils Absolute: 42 cells/uL (ref 0–200)
Basophils Relative: 0.9 %
EOS ABS: 113 {cells}/uL (ref 15–500)
Eosinophils Relative: 2.4 %
HCT: 33.2 % — ABNORMAL LOW (ref 35.0–45.0)
Hemoglobin: 11.2 g/dL — ABNORMAL LOW (ref 11.7–15.5)
Lymphs Abs: 1076 cells/uL (ref 850–3900)
MCH: 28.4 pg (ref 27.0–33.0)
MCHC: 33.7 g/dL (ref 32.0–36.0)
MCV: 84.1 fL (ref 80.0–100.0)
MONOS PCT: 10.5 %
MPV: 9.1 fL (ref 7.5–12.5)
NEUTROS PCT: 63.3 %
Neutro Abs: 2975 cells/uL (ref 1500–7800)
PLATELETS: 251 10*3/uL (ref 140–400)
RBC: 3.95 10*6/uL (ref 3.80–5.10)
RDW: 13.2 % (ref 11.0–15.0)
TOTAL LYMPHOCYTE: 22.9 %
WBC: 4.7 10*3/uL (ref 3.8–10.8)
WBCMIX: 494 {cells}/uL (ref 200–950)

## 2017-05-29 LAB — LIPID PANEL
CHOL/HDL RATIO: 2.7 (calc) (ref <5.0)
CHOLESTEROL: 234 mg/dL — AB (ref <200)
HDL: 86 mg/dL (ref >50)
LDL CHOLESTEROL (CALC): 132 mg/dL — AB
Non-HDL Cholesterol (Calc): 148 mg/dL (calc) — ABNORMAL HIGH (ref <130)
TRIGLYCERIDES: 66 mg/dL (ref <150)

## 2017-05-29 LAB — COMPLETE METABOLIC PANEL WITH GFR
AG RATIO: 1.6 (calc) (ref 1.0–2.5)
ALT: 14 U/L (ref 6–29)
AST: 15 U/L (ref 10–35)
Albumin: 4.2 g/dL (ref 3.6–5.1)
Alkaline phosphatase (APISO): 85 U/L (ref 33–130)
BUN: 12 mg/dL (ref 7–25)
CALCIUM: 9.3 mg/dL (ref 8.6–10.4)
CHLORIDE: 107 mmol/L (ref 98–110)
CO2: 27 mmol/L (ref 20–32)
CREATININE: 0.62 mg/dL (ref 0.50–1.05)
GFR, Est African American: 120 mL/min/{1.73_m2} (ref >=60)
GFR, Est Non African American: 104 mL/min/{1.73_m2} (ref >=60)
GLOBULIN: 2.7 g/dL (ref 1.9–3.7)
Glucose, Bld: 91 mg/dL (ref 65–99)
Potassium: 4 mmol/L (ref 3.5–5.3)
Sodium: 142 mmol/L (ref 135–146)
Total Bilirubin: 0.5 mg/dL (ref 0.2–1.2)
Total Protein: 6.9 g/dL (ref 6.1–8.1)

## 2017-05-29 LAB — TSH: TSH: 0.51 mIU/L

## 2017-05-29 LAB — VITAMIN D 25 HYDROXY (VIT D DEFICIENCY, FRACTURES): Vit D, 25-Hydroxy: 15 ng/mL — ABNORMAL LOW (ref 30–100)

## 2017-05-31 ENCOUNTER — Encounter: Payer: Self-pay | Admitting: Internal Medicine

## 2017-05-31 ENCOUNTER — Ambulatory Visit: Payer: Medicare Other | Admitting: Internal Medicine

## 2017-05-31 VITALS — BP 120/86 | HR 79 | Ht 61.0 in | Wt 183.0 lb

## 2017-05-31 DIAGNOSIS — M450 Ankylosing spondylitis of multiple sites in spine: Secondary | ICD-10-CM | POA: Diagnosis not present

## 2017-05-31 DIAGNOSIS — Z853 Personal history of malignant neoplasm of breast: Secondary | ICD-10-CM | POA: Diagnosis not present

## 2017-05-31 DIAGNOSIS — M4310 Spondylolisthesis, site unspecified: Secondary | ICD-10-CM | POA: Diagnosis not present

## 2017-05-31 DIAGNOSIS — Z0000 Encounter for general adult medical examination without abnormal findings: Secondary | ICD-10-CM

## 2017-05-31 DIAGNOSIS — F439 Reaction to severe stress, unspecified: Secondary | ICD-10-CM

## 2017-05-31 DIAGNOSIS — R8290 Unspecified abnormal findings in urine: Secondary | ICD-10-CM

## 2017-05-31 DIAGNOSIS — R7302 Impaired glucose tolerance (oral): Secondary | ICD-10-CM

## 2017-05-31 DIAGNOSIS — I10 Essential (primary) hypertension: Secondary | ICD-10-CM

## 2017-05-31 DIAGNOSIS — E559 Vitamin D deficiency, unspecified: Secondary | ICD-10-CM

## 2017-05-31 DIAGNOSIS — M7918 Myalgia, other site: Secondary | ICD-10-CM

## 2017-05-31 DIAGNOSIS — M2550 Pain in unspecified joint: Secondary | ICD-10-CM | POA: Diagnosis not present

## 2017-05-31 DIAGNOSIS — R7989 Other specified abnormal findings of blood chemistry: Secondary | ICD-10-CM | POA: Diagnosis not present

## 2017-05-31 LAB — POCT URINALYSIS DIPSTICK
APPEARANCE: ABNORMAL
Bilirubin, UA: NEGATIVE
Glucose, UA: NEGATIVE
Ketones, UA: NEGATIVE
NITRITE UA: NEGATIVE
Odor: ABNORMAL
PH UA: 7 (ref 5.0–8.0)
Protein, UA: NEGATIVE
RBC UA: NEGATIVE
SPEC GRAV UA: 1.015 (ref 1.010–1.025)
UROBILINOGEN UA: 0.2 U/dL

## 2017-05-31 MED ORDER — ERGOCALCIFEROL 1.25 MG (50000 UT) PO CAPS: 50000.0000 [IU] | ORAL_CAPSULE | ORAL | 0 refills | Status: DC

## 2017-05-31 MED ORDER — METHYLPREDNISOLONE ACETATE 80 MG/ML IJ SUSP
80.0000 mg | Freq: Once | INTRAMUSCULAR | Status: AC
  Administered 2017-05-31: 80 mg via INTRAMUSCULAR

## 2017-06-01 LAB — URINE CULTURE
MICRO NUMBER: 90338506
RESULT: NO GROWTH
SPECIMEN QUALITY:: ADEQUATE

## 2017-06-01 NOTE — Progress Notes (Signed)
Subjective:    Patient ID: Sonya Franklin, female    DOB: [birth year], 52 y.o.   MRN: [id number]  HPI 52 year old Black Female in today for health maintenance exam and evaluation of medical issues.  She receives disability benefits with history of fibromyalgia syndrome, breast cancer, chronic musculoskeletal pain, ankylosing spondylitis.  She has a history of low TSH level which is been evaluated by Dr. Dwyane Dee.  Is thought to have a multinodular goiter and not true hyperthyroidism.  History of colonoscopy and endoscopy by Dr. Fuller Plan 1999 showing mild esophagitis.  Was admitted to the hospital November 2013 with abdominal pain.  CT showed an inflammatory process.  However stool for C. difficile and culture were negative.  She was treated with Cipro and Flagyl and improved.  She complained of recurrent headaches and CT of the brain was negative.  Has had some recurrent episodes of abdominal pain that responded to steroids.  Has not had to be treated recently for this.  Colonoscopy by Dr. Fuller Plan in 2014 was normal.  Negative Cardiolite study October 2007.  2D echocardiogram 2008 was negative.  Carotid Dopplers 2006-.  Patient had biopsy of complex thyroid nodule right inferior lobe November 2006 which showed benign colloid nodule.  In 2009 she had nerve conduction studies because of right upper extremity pain and these were normal.  History of trochanteric bursitis right hip.  Has decreased energy which is long-standing and never feels well because of chronic musculoskeletal pain.  She is to see Dr. Justine Null, rheumatologist for ankylosing spondylitis.  Was on low-dose prednisone 5 mg daily for period of time.  Subsequently changed to Celebrex around 2005.  In 2009 she had an injection of right trochanteric bursa by Dr. Ouida Sills.  From time to time I give her a Depo-Medrol injection for musculoskeletal pain and she improves.  She no longer sees a rheumatologist.  Social history: She is divorced.  She has a  disabled son born in 52.  She previously worked in accounts payable for pyelogram program.  Does not smoke or consume alcohol.  Family history: History of goiter and diabetes as well as hypertension in mother.  Father with history of diabetes and hypertension as well as gout.  Patient indicates she is allergic to Aleve and shellfish.  GYN is Dr. Melinda Crutch but he retired and she can be seen in that practice.  Had total hysterectomy 2008.  Right breast cancer 2007.  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  FISH negative.  She had adjuvant chemotherapy consisting of 4 cycles of Cytoxan and doxorubicin then weekly Taxol x4.  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  She had bilateral mastectomies November 2007 with no evidence of residual disease.  She was started on tamoxifen January 2008.  She had breast reconstruction 2008.  In May 2018 she had Campylobacter intestinal infection requiring admission to the hospital for volume depletion.  Was found to have mild glucose intolerance at that time of 5.8%.  Did not want to take Benicar for hypertension.  She did not like the way it made her feel.  Had LS spine films in 2018 showing anterolisthesis at a couple of levels.  Subsequently had MRI of the LS spine showing a mild broad-based disc bulge at L5-S1 and moderate bilateral facet arthropathy.  Minimal grade 1 anterior listhesis of L5 on S1 secondary to facet disease.    Review of Systems see above     Objective:   Physical  Exam  Constitutional: She is oriented to person, place, and time. She appears well-developed and well-nourished. No distress.  HENT:  Head: Normocephalic and atraumatic.  Right Ear: External ear normal.  Left Ear: External ear normal.  Mouth/Throat: Oropharynx is clear and moist.  Eyes: Pupils are equal, round, and reactive to light. Conjunctivae and EOM are normal. Right eye exhibits no discharge.  Neck: Neck supple. No JVD  present. No thyromegaly present.  Cardiovascular: Normal rate, regular rhythm, normal heart sounds and intact distal pulses.  No murmur heard. Pulmonary/Chest: Effort normal and breath sounds normal. She has no wheezes. She has no rales.  Abdominal: Soft. Bowel sounds are normal. She exhibits no distension and no mass. There is no tenderness. There is no rebound and no guarding.  Genitourinary:  Genitourinary Comments: Deferred to GYN  Musculoskeletal: She exhibits no edema.  Lymphadenopathy:    She has no cervical adenopathy.  Neurological: She is alert and oriented to person, place, and time. She has normal reflexes. No cranial nerve deficit. Coordination normal.  Skin: Skin is warm and dry. No rash noted. She is not diaphoretic.  Psychiatric: She has a normal mood and affect. Her behavior is normal. Thought content normal.  Vitals reviewed.         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  TSH is low at 0.51 but that falls within normal limits  Fibromyalgia syndrome with chronic musculoskeletal pain  History of right trochanteric bursitis  History of right breast cancer  Anxiety  History of vitamin D deficiency-level is 15 so Drisdol 50,000 units was prescribed weekly for 12 weeks then she is to take 2000 units daily  History of impaired glucose tolerance-hemoglobin A1c 5.8%.  Continue to watch diet  History of Campylobacter diarrhea 2018  Plan: She requests Depo-Medrol 80 mg IM which was given today.  Return in 6 months or as needed.  Subjective:   Patient presents for Medicare Annual/Subsequent preventive examination.  Review Past Medical/Family/Social:   Risk Factors  Current exercise habits:  Dietary issues discussed:    Cardiac risk factors:  Depression Screen  (Note: if answer to either of the following is "Yes", a more complete depression screening is indicated)   Over the past two weeks, have you felt down, depressed or hopeless? No  Over the past two weeks, have you felt little interest or pleasure in doing things? No Have you lost interest or pleasure in daily life? No Do you often feel hopeless? No Do you cry easily over simple problems? No   Activities of Daily Living  In your present state of health, do you have any difficulty performing the following activities?:   Driving? 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 Yes-had many repairs recently Feeding yourself? No  Getting from bed to chair? yes due to musculoskeletal paines Climbing a flight of stairs? y due to musculoskeletal paines Preparing food and eating?: No  Bathing or showering? No  Getting dressed: yes Getting to the toilet? No  Using the toilet:No  Moving around from place to place: yes In the past year have you fallen or had a near fall?:No  Are you sexually active? yes Do you have more than one partner? No   Hearing Difficulties:   Do you often ask people to speak  up or repeat themselves?  Yes Do you experience ringing or noises in your ears?  Yes Do you have difficulty understanding soft or whispered voices?  Yes Do you feel that you have a problem with memory?  Yes Do you often misplace items?  Yes   Home Safety:  Do you have a smoke alarm at your residence? Yes Do you have grab bars in the bathroom?  No Do you have throw rugs in your house?  Yes   Cognitive Testing  Alert? Yes Normal Appearance?Yes  Oriented to person? Yes Place? Yes  Time? Yes  Recall of three objects? Yes  Can perform simple calculations? Yes  Displays appropriate judgment?Yes  Can read the correct time from a watch face?Yes   List the Names of Other Physician/Practitioners you currently use:  See referral list for the  physicians patient is currently seeing.     Review of Systems: See above   Objective:     General appearance: Appears stated age and mildly obese  Head: Normocephalic, without obvious abnormality, atraumatic  Eyes: conj clear, EOMi PEERLA  Ears: normal TM's and external ear canals both ears  Nose: Nares normal. Septum midline. Mucosa normal. No drainage or sinus tenderness.  Throat: lips, mucosa, and tongue normal; teeth and gums normal  Neck: no adenopathy, no carotid bruit, no JVD, supple, symmetrical, trachea midline and thyroid not enlarged, symmetric, no tenderness/mass/nodules  No CVA tenderness.  Lungs: clear to auscultation bilaterally  Breasts: normal appearance, no masses or tenderness see above Heart: regular rate and rhythm, S1, S2 normal, no murmur, click, rub or gallop  Abdomen: soft, non-tender; bowel sounds normal; no masses, no organomegaly  Musculoskeletal: ROM normal in all joints, no crepitus, no deformity, Normal muscle strengthen. Back  is symmetric, no curvature. Skin: Skin color, texture, turgor normal. No rashes or lesions  Lymph nodes: Cervical, supraclavicular, and axillary nodes normal.  Neurologic: CN 2 -12 Normal, Normal symmetric reflexes. Normal coordination and gait  Psych: Alert & Oriented x 3, Mood appear stable.    Assessment:    Annual wellness medicare exam   Plan:    During the course of the visit the patient was educated and counseled about appropriate screening and preventive services .       Patient Instructions (the written plan) was given to the patient.  Medicare Attestation  I have personally reviewed:  The patient's medical and social history  Their use of alcohol, tobacco or illicit drugs  Their current medications and supplements  The patient's functional ability including ADLs,fall risks, home safety risks, cognitive, and hearing and visual impairment  Diet and physical activities  Evidence for depression or mood  disorders  The patient's weight, height, BMI, and visual acuity have been recorded in the chart. I have made referrals, counseling, and provided education to the patient based on review of the above and I have provided the patient with a written personalized care plan for preventive services.

## 2017-06-13 ENCOUNTER — Encounter: Payer: Self-pay | Admitting: Internal Medicine

## 2017-06-13 NOTE — Patient Instructions (Signed)
It was a pleasure to see you today.  Depo-Medrol 80 mg IM given for musculoskeletal pain.  Watch carbohydrates.  Take vitamin D supplement weekly as directed then 2000 units vitamin D3 daily.  Continue same medications and follow-up in 6 months.  See GYN physician.

## 2017-06-17 DIAGNOSIS — C50911 Malignant neoplasm of unspecified site of right female breast: Secondary | ICD-10-CM | POA: Diagnosis not present

## 2017-06-17 DIAGNOSIS — Z9012 Acquired absence of left breast and nipple: Secondary | ICD-10-CM | POA: Diagnosis not present

## 2017-07-06 DIAGNOSIS — C50911 Malignant neoplasm of unspecified site of right female breast: Secondary | ICD-10-CM | POA: Diagnosis not present

## 2017-08-20 ENCOUNTER — Other Ambulatory Visit: Payer: Self-pay | Admitting: Internal Medicine

## 2017-08-23 ENCOUNTER — Encounter: Payer: Self-pay | Admitting: Internal Medicine

## 2017-08-23 ENCOUNTER — Ambulatory Visit: Payer: Medicare Other | Admitting: Internal Medicine

## 2017-08-23 VITALS — BP 120/90 | HR 97 | Ht 61.0 in | Wt 174.0 lb

## 2017-08-23 DIAGNOSIS — M7918 Myalgia, other site: Secondary | ICD-10-CM | POA: Diagnosis not present

## 2017-08-23 DIAGNOSIS — M459 Ankylosing spondylitis of unspecified sites in spine: Secondary | ICD-10-CM

## 2017-08-23 DIAGNOSIS — E559 Vitamin D deficiency, unspecified: Secondary | ICD-10-CM | POA: Diagnosis not present

## 2017-08-23 DIAGNOSIS — R7302 Impaired glucose tolerance (oral): Secondary | ICD-10-CM

## 2017-08-23 MED ORDER — METHYLPREDNISOLONE ACETATE 80 MG/ML IJ SUSP
80.0000 mg | Freq: Once | INTRAMUSCULAR | Status: AC
  Administered 2017-08-23: 80 mg via INTRAMUSCULAR

## 2017-08-23 NOTE — Progress Notes (Signed)
   Subjective:    Patient ID: Sonya Franklin, female    DOB: Mar 28, 1965, 52 y.o.   MRN: [id number]  HPI Pleasant 52 year old female in today to follow-up of impaired glucose tolerance and vitamin D deficiency.  In March vitamin D level was low at 15 and hemoglobin A1c was 5.8% Hemoglobin A1c is now 5.7%.  Vitamin D level is still low at 20.  We are going to place her on high-dose vitamin D 50,000 units weekly for 6 months.  Patient has a history of ankylosing spondylitis and has diffuse musculoskeletal pain.  Generally benefits from Depo-Medrol 80 mg IM from time to time and she would like to get an injection today.  She does have a history of low TSH however March her TSH was normal at 0.51.  Has seen Dr. Dwyane Dee in the past and apparently was felt to have a multinodular goiter and not true hyperthyroidism.  Review of Systems see above     Objective:   Physical Exam  She was not examined today but spent 15 minutes speaking with her about these issues.      Assessment & Plan:  Ankylosing spondylitis-Depo-Medrol 80 mg IM given today . Vitamin D deficiency  Impaired glucose tolerance  She will continue to work on diet and exercise for impaired glucose tolerance and will be placed on high-dose vitamin D weekly for vitamin D deficiency for 6 months

## 2017-08-24 LAB — HEMOGLOBIN A1C
EAG (MMOL/L): 6.5 (calc)
Hgb A1c MFr Bld: 5.7 % of total Hgb — ABNORMAL HIGH (ref <5.7)
MEAN PLASMA GLUCOSE: 117 (calc)

## 2017-08-24 LAB — VITAMIN D 25 HYDROXY (VIT D DEFICIENCY, FRACTURES): Vit D, 25-Hydroxy: 20 ng/mL — ABNORMAL LOW (ref 30–100)

## 2017-08-24 MED ORDER — ERGOCALCIFEROL 1.25 MG (50000 UT) PO CAPS: 50000.0000 [IU] | ORAL_CAPSULE | ORAL | 1 refills | Status: DC

## 2017-08-25 ENCOUNTER — Other Ambulatory Visit: Payer: Self-pay | Admitting: Internal Medicine

## 2017-09-11 DIAGNOSIS — R7302 Impaired glucose tolerance (oral): Secondary | ICD-10-CM | POA: Insufficient documentation

## 2017-09-11 MED ORDER — METHYLPREDNISOLONE ACETATE 80 MG/ML IJ SUSP
80.0000 mg | Freq: Once | INTRAMUSCULAR | Status: AC
  Administered 2020-12-17: 80 mg via INTRAMUSCULAR

## 2017-09-11 NOTE — Patient Instructions (Addendum)
Depo-Medrol 80 mg IM given in office today.  Continue to work on diet and exercise.  Prescribed weekly vitamin D 50,000 units p.o. for 6 months.

## 2017-10-20 DIAGNOSIS — H6123 Impacted cerumen, bilateral: Secondary | ICD-10-CM | POA: Diagnosis not present

## 2017-10-20 DIAGNOSIS — Z0130 Encounter for examination of blood pressure without abnormal findings: Secondary | ICD-10-CM | POA: Diagnosis not present

## 2017-10-20 DIAGNOSIS — J019 Acute sinusitis, unspecified: Secondary | ICD-10-CM | POA: Diagnosis not present

## 2017-10-26 ENCOUNTER — Ambulatory Visit (INDEPENDENT_AMBULATORY_CARE_PROVIDER_SITE_OTHER): Payer: Medicare Other | Admitting: Internal Medicine

## 2017-10-26 ENCOUNTER — Encounter: Payer: Self-pay | Admitting: Internal Medicine

## 2017-10-26 VITALS — BP 120/80 | HR 80 | Temp 98.1°F | Ht 61.0 in | Wt 180.0 lb

## 2017-10-26 DIAGNOSIS — H6122 Impacted cerumen, left ear: Secondary | ICD-10-CM | POA: Diagnosis not present

## 2017-10-26 DIAGNOSIS — F411 Generalized anxiety disorder: Secondary | ICD-10-CM

## 2017-10-26 DIAGNOSIS — I10 Essential (primary) hypertension: Secondary | ICD-10-CM | POA: Diagnosis not present

## 2017-10-26 DIAGNOSIS — H6693 Otitis media, unspecified, bilateral: Secondary | ICD-10-CM

## 2017-10-26 DIAGNOSIS — Z853 Personal history of malignant neoplasm of breast: Secondary | ICD-10-CM

## 2017-10-26 DIAGNOSIS — R7302 Impaired glucose tolerance (oral): Secondary | ICD-10-CM

## 2017-10-26 DIAGNOSIS — M542 Cervicalgia: Secondary | ICD-10-CM | POA: Diagnosis not present

## 2017-10-26 MED ORDER — METHYLPREDNISOLONE ACETATE 80 MG/ML IJ SUSP
80.0000 mg | Freq: Once | INTRAMUSCULAR | Status: AC
  Administered 2017-10-26: 80 mg via INTRAMUSCULAR

## 2017-10-26 MED ORDER — DOXYCYCLINE HYCLATE 100 MG PO TABS: 100.0000 mg | ORAL_TABLET | Freq: Two times a day (BID) | ORAL | 0 refills | Status: DC

## 2017-10-26 NOTE — Progress Notes (Signed)
   Subjective:    Patient ID: Sonya Franklin, female    DOB: 07-05-1965, 52 y.o.   MRN: [id number]  HPI Pt went to [facility] August 7th and was dx with sinusitis. Took Zithromax x 3 days 500 mg daily. Says she has stiff neck and headache. Says ears are stopped up especially left ear.Hx post nasal drip Hx impacted cerumen. Had tick bite recently. No fever or chills. Says left arm tingles some.    Review of Systems see above     Objective:   Physical Exam Palpable spasm bilateral SCM muscles. Cerumen both external ear canals removed with curette. Pharynx slightly injected. Neck supple. Chest clear. No focal deficits on brief Neurological exam and muscle strength is normal in LUE.TMS are dull bilaterally.       Assessment & Plan:  Bilateral otitis media  URI  Tick exposure  Plan: Depomedrol 80 mg IM. Doxycycline 100 mg bid x 10 days. CBC with diff drawn. Depomedrol should help congestion and musculoskeletal pain. Call if not improving in 48 hours or sooner if worse.

## 2017-10-26 NOTE — Addendum Note (Signed)
Addended by: Mady Haagensen on: 10/26/2017 03:22 PM   Modules accepted: Orders

## 2017-10-26 NOTE — Patient Instructions (Signed)
Cerumen removed from both ears. Depomedrol 80 mg IM for ear congestion and neck pain. Doxycycline 100 mg bid x 10 days. CBC with diff drawn and pending.

## 2017-10-27 LAB — CBC WITH DIFFERENTIAL/PLATELET
Basophils Absolute: 21 {cells}/uL (ref 0–200)
Basophils Relative: 0.3 %
Eosinophils Absolute: 78 {cells}/uL (ref 15–500)
Eosinophils Relative: 1.1 %
HCT: 35.9 % (ref 35.0–45.0)
Hemoglobin: 11.9 g/dL (ref 11.7–15.5)
Lymphs Abs: 1491 {cells}/uL (ref 850–3900)
MCH: 28.1 pg (ref 27.0–33.0)
MCHC: 33.1 g/dL (ref 32.0–36.0)
MCV: 84.7 fL (ref 80.0–100.0)
MPV: 8.9 fL (ref 7.5–12.5)
Monocytes Relative: 7.5 %
Neutro Abs: 4977 {cells}/uL (ref 1500–7800)
Neutrophils Relative %: 70.1 %
Platelets: 282 Thousand/uL (ref 140–400)
RBC: 4.24 Million/uL (ref 3.80–5.10)
RDW: 13.4 % (ref 11.0–15.0)
Total Lymphocyte: 21 %
WBC mixed population: 533 {cells}/uL (ref 200–950)
WBC: 7.1 Thousand/uL (ref 3.8–10.8)

## 2018-03-01 DIAGNOSIS — Z0000 Encounter for general adult medical examination without abnormal findings: Secondary | ICD-10-CM | POA: Diagnosis not present

## 2018-03-01 DIAGNOSIS — Z853 Personal history of malignant neoplasm of breast: Secondary | ICD-10-CM | POA: Diagnosis not present

## 2018-03-11 ENCOUNTER — Encounter: Payer: Self-pay | Admitting: Internal Medicine

## 2018-03-11 ENCOUNTER — Ambulatory Visit (INDEPENDENT_AMBULATORY_CARE_PROVIDER_SITE_OTHER): Payer: Medicare Other | Admitting: Internal Medicine

## 2018-03-11 VITALS — BP 130/80 | HR 83 | Temp 98.1°F | Ht 61.0 in | Wt 180.0 lb

## 2018-03-11 DIAGNOSIS — H109 Unspecified conjunctivitis: Secondary | ICD-10-CM

## 2018-03-11 DIAGNOSIS — R7302 Impaired glucose tolerance (oral): Secondary | ICD-10-CM

## 2018-03-11 DIAGNOSIS — M459 Ankylosing spondylitis of unspecified sites in spine: Secondary | ICD-10-CM

## 2018-03-11 DIAGNOSIS — R6883 Chills (without fever): Secondary | ICD-10-CM

## 2018-03-11 DIAGNOSIS — R7989 Other specified abnormal findings of blood chemistry: Secondary | ICD-10-CM

## 2018-03-11 DIAGNOSIS — Z853 Personal history of malignant neoplasm of breast: Secondary | ICD-10-CM

## 2018-03-11 MED ORDER — METHYLPREDNISOLONE ACETATE 80 MG/ML IJ SUSP
80.0000 mg | Freq: Once | INTRAMUSCULAR | Status: AC
  Administered 2018-03-11: 80 mg via INTRAMUSCULAR

## 2018-03-11 MED ORDER — OFLOXACIN 0.3 % OP SOLN: 2.0000 [drp] | Freq: Four times a day (QID) | OPHTHALMIC | 0 refills | Status: DC

## 2018-03-11 NOTE — Progress Notes (Signed)
   Subjective:    Patient ID: Sonya Franklin, female    DOB: Dec 14, 1965, 52 y.o.   MRN: [id number]  HPI 52 year old Female with history of ankylosing spondylitis diagnosed by Dr.Rowe number of years ago in today complaining of generalized musculoskeletal pain.  From time to time I gave her an injection of Depo-Medrol and that seems to help her symptoms.  She was seen recently by GYN physician who noted that she had a low TSH.  I reminded her that this was not a new finding.  Has been evaluated previously by Dr. Dwyane Dee and is thought to have multinodular goiter and not true hyperthyroidism.  Will refer back to Dr. Dwyane Dee for reevaluation.  TSH on December 17 at [name] was 0.347 normal being between 0.450 and 4.5 .  Free T4 was not done.  Her recent total cholesterol was 238 at the GYN office with an LDL cholesterol of 132.  Hemoglobin A1c was 5.8%.  She does not exercise much due to musculoskeletal pain.  Her liver functions were normal.  Sodium and potassium are normal.  CBC was normal.  History of breast cancer in the remote past.  Has had hysterectomy.  Had colonoscopy in 2017.  History of hypertension.  Complaining of some redness of her eyes but no frank drainage.  She thinks she may have conjunctivitis.  Have prescribed Ocuflox ophthalmic drops 2 drops in each eye 4 times a day for 5 days.    Review of Systems see above     Objective:   Physical Exam Mild enlargement of thyroid gland which is symmetrical.  Bilateral conjunctival erythema but no drainage  Records reviewed from Dr. [place] office including low TSH.  TSH here in March was 0.51.  TSH in May 2018 was 0.100.  TSH in 2017 was 0.25 with normal free T4       Assessment & Plan:  Fatigue and chills-etiology unclear.  Impaired glucose tolerance-she will be seen by Dr. Cristal Deer TSH-she will be seen by Dr. Dwyane Dee for evaluation  Bilateral conjunctivitis-prescribed Ocuflox ophthalmic drops  Plan: She will  be given Depo-Medrol 80 mg IM for musculoskeletal pain which seems to last for a number of weeks for her and does a good job for her.

## 2018-03-11 NOTE — Patient Instructions (Signed)
Patient will be seen by Dr. Dwyane Dee regarding low TSH level and impaired glucose tolerance.  Depo-Medrol 80 mg IM today for musculoskeletal pain associated with ankylosing spondylitis.

## 2018-04-20 ENCOUNTER — Other Ambulatory Visit: Payer: Self-pay | Admitting: Internal Medicine

## 2018-05-30 ENCOUNTER — Other Ambulatory Visit: Payer: Self-pay | Admitting: Internal Medicine

## 2018-06-16 DIAGNOSIS — G4733 Obstructive sleep apnea (adult) (pediatric): Secondary | ICD-10-CM | POA: Diagnosis not present

## 2018-06-22 ENCOUNTER — Ambulatory Visit: Payer: Medicare Other | Admitting: Endocrinology

## 2018-07-05 ENCOUNTER — Other Ambulatory Visit: Payer: Self-pay | Admitting: Internal Medicine

## 2018-08-02 DIAGNOSIS — C50911 Malignant neoplasm of unspecified site of right female breast: Secondary | ICD-10-CM | POA: Diagnosis not present

## 2018-08-12 ENCOUNTER — Ambulatory Visit: Payer: Medicare Other | Admitting: Endocrinology

## 2018-08-15 ENCOUNTER — Telehealth: Payer: Self-pay | Admitting: Internal Medicine

## 2018-08-15 NOTE — Telephone Encounter (Signed)
Tomorrow :)

## 2018-08-15 NOTE — Telephone Encounter (Signed)
Done

## 2018-08-15 NOTE — Telephone Encounter (Signed)
Pt called and she wants to schedule her shot and that Dr Renold Genta would know which shot she is talking about, when do you want he to come in for this?

## 2018-08-16 ENCOUNTER — Encounter: Payer: Self-pay | Admitting: Internal Medicine

## 2018-08-16 ENCOUNTER — Ambulatory Visit (INDEPENDENT_AMBULATORY_CARE_PROVIDER_SITE_OTHER): Payer: Medicare Other | Admitting: Internal Medicine

## 2018-08-16 VITALS — BP 120/80 | HR 80 | Temp 98.2°F | Ht 61.0 in | Wt 178.0 lb

## 2018-08-16 DIAGNOSIS — I10 Essential (primary) hypertension: Secondary | ICD-10-CM

## 2018-08-16 DIAGNOSIS — M459 Ankylosing spondylitis of unspecified sites in spine: Secondary | ICD-10-CM | POA: Diagnosis not present

## 2018-08-16 MED ORDER — METHYLPREDNISOLONE ACETATE 80 MG/ML IJ SUSP
80.0000 mg | Freq: Once | INTRAMUSCULAR | Status: AC
  Administered 2018-08-16: 80 mg via INTRAMUSCULAR

## 2018-08-16 NOTE — Patient Instructions (Signed)
Depo-Medrol 80 mg IM for ankylosing spondylitis.  Blood pressure stable on current regimen.  Needs to schedule physical exam in the near future.

## 2018-08-16 NOTE — Progress Notes (Signed)
   Subjective:    Patient ID: Sonya Franklin, female    DOB: 1965-10-27, 53 y.o.   MRN: [id number]  HPI 53 year old Female with history of ankylosing spondylitis a number of years ago.  From time to time, she received an injection of Depo-Medrol and that seems to help her symptoms.  Feeling more stiff and sore particularly in her low back recently.  Staying at home during the pandemic.  Has her groceries delivered.  Has limited exposure to others.  Resides with her son.  Remote history of breast cancer.  History of hypertension.    Review of Systems feels fullness in left ear     Objective:   Physical Exam  Blood pressure stable at 120/80.  Chest clear.  Cardiac exam regular rate and rhythm.  Tender over SI joints.  Left TM somewhat obscured by cerumen but not red      Assessment & Plan:  Ankylosing spondylitis-given 80 mg IM Depo-Medrol which seems to help her pain.  Essential hypertension-stable on current regimen on amlodipine 5 mg daily.  Anxiety state due to pandemic today she does not want to be on antianxiety medication  Plan: Continue to encourage her to social distance and follow-up in the next few months for physical exam.  Last physical exam was May 31, 2017.

## 2018-08-16 NOTE — Addendum Note (Signed)
Addended by: Mady Haagensen on: 08/16/2018 01:11 PM   Modules accepted: Orders

## 2018-08-29 ENCOUNTER — Other Ambulatory Visit: Payer: Self-pay | Admitting: Internal Medicine

## 2018-10-08 ENCOUNTER — Other Ambulatory Visit: Payer: Self-pay | Admitting: Internal Medicine

## 2018-10-13 ENCOUNTER — Encounter: Payer: Medicare Other | Admitting: Endocrinology

## 2018-10-13 ENCOUNTER — Encounter: Payer: Self-pay | Admitting: Endocrinology

## 2018-10-13 ENCOUNTER — Other Ambulatory Visit: Payer: Self-pay

## 2018-10-14 NOTE — Progress Notes (Signed)
This encounter was created in error - please disregard.

## 2018-10-16 ENCOUNTER — Other Ambulatory Visit: Payer: Self-pay | Admitting: Internal Medicine

## 2018-10-18 DIAGNOSIS — G4730 Sleep apnea, unspecified: Secondary | ICD-10-CM

## 2018-10-19 ENCOUNTER — Other Ambulatory Visit: Payer: Self-pay

## 2018-10-19 ENCOUNTER — Ambulatory Visit (INDEPENDENT_AMBULATORY_CARE_PROVIDER_SITE_OTHER): Payer: Medicare Other | Admitting: Endocrinology

## 2018-10-19 ENCOUNTER — Encounter: Payer: Self-pay | Admitting: Endocrinology

## 2018-10-19 VITALS — BP 100/80 | HR 73 | Ht 61.0 in | Wt 179.0 lb

## 2018-10-19 DIAGNOSIS — R7989 Other specified abnormal findings of blood chemistry: Secondary | ICD-10-CM | POA: Diagnosis not present

## 2018-10-19 DIAGNOSIS — E042 Nontoxic multinodular goiter: Secondary | ICD-10-CM | POA: Diagnosis not present

## 2018-10-19 LAB — T3, FREE: T3, Free: 3.4 pg/mL (ref 2.3–4.2)

## 2018-10-19 LAB — TSH: TSH: 0.25 u[IU]/mL — ABNORMAL LOW (ref 0.35–4.50)

## 2018-10-19 LAB — T4, FREE: Free T4: 0.87 ng/dL (ref 0.60–1.60)

## 2018-10-19 NOTE — Progress Notes (Signed)
Reason for Appointment: Thyroid test abnormal, new consultation    History of Present Illness:   Patient has been referred by Dr. Renold Genta  The patient's thyroid abnormality was first discovered in 02/2009  At that time she had an episode of shakiness and palpitations but no other symptoms of weight loss or sweating Although her TSH was 0.03 her free T4 and T3 were normal at that time Her symptoms resolved spontaneously and when she was seen in consultation about 2 months later her TSH was improving at 0.12 and her free T4 and T3 were also normal She was told to follow-up but she did not do so  She is now being referred here for follow-up since she has had a periodically abnormal TSH test without increased free T4 or T3 Her last abnormal TSH was 0.347 done by her gynecologist in 12/19  She recently does not have any palpitations, shakiness, weight loss, heat intolerance, sweating or nervousness  Does not feel like she has any choking sensation in her neck or pressure in any position or when lying down.  No recent thyroid levels are available since 02/2018  Lab Results  Component Value Date   FREET4 1.1 03/03/2016   FREET4 1.03 02/09/2012   FREET4 0.98 05/04/2011   TSH 0.51 05/28/2017   TSH 0.100 (L) 07/17/2016   TSH 0.25 (L) 03/03/2016    She has had a thyroid scan in 02/2009 which showed a thyroid enlargement especially on the right side and heterogenous uptake and scattered areas of increased and decreased uptake along with a 24-hour uptake of 34.7  She has not had a thyroid ultrasound   Allergies as of 10/19/2018      Reactions   Shellfish Allergy Anaphylaxis   Blue Dyes (parenteral)       Medication List       Accurate as of October 19, 2018  2:23 PM. If you have any questions, ask your nurse or doctor.        amLODipine 5 MG tablet Commonly known as: NORVASC TAKE 1 TABLET BY MOUTH EVERY DAY   ibuprofen 800 MG tablet Commonly known as: ADVIL TAKE 1  TABLET (800 MG TOTAL) BY MOUTH EVERY 8 (EIGHT) HOURS AS NEEDED FOR PAIN. What changed: See the new instructions.   olopatadine 0.1 % ophthalmic solution Commonly known as: Patanol Place 1 drop into both eyes 2 (two) times daily. What changed:   when to take this  reasons to take this   pantoprazole 40 MG tablet Commonly known as: PROTONIX TAKE 1 TABLET BY MOUTH EVERY DAY   saccharomyces boulardii 250 MG capsule Commonly known as: FLORASTOR Take 1 capsule (250 mg total) by mouth 2 (two) times daily. What changed:   when to take this  reasons to take this   Vitamin D (Ergocalciferol) 1.25 MG (50000 UT) Caps capsule Commonly known as: DRISDOL TAKE 1 CAPSULE BY MOUTH ONE TIME PER WEEK       Allergies:  Allergies  Allergen Reactions  . Shellfish Allergy Anaphylaxis  . Blue Dyes (Parenteral)     Past Medical History:  Diagnosis Date  . Ankylosing spondylitis ([place])   . Anxiety   . Arthritis   . Breast cancer ([place])    right breast  . Bursitis of hip, right   . Cardiac arrhythmia   . GE reflux   . Hiatal hernia   . Hypertension   . Hyperthyroidism   . Ischemic bowel disease ([place])   . Snoring 03/21/2014  .  Vitamin D deficiency     Past Surgical History:  Procedure Laterality Date  . ABDOMINAL HYSTERECTOMY    . BREAST LUMPECTOMY     right  . MASTECTOMY     double    Family History  Problem Relation Age of Onset  . Breast cancer Maternal Aunt   . Colitis Brother   . Diabetes Mother   . Hypertension Mother   . Hyperlipidemia Mother     Social History:  reports that she has never smoked. She has never used smokeless tobacco. She reports that she does not drink alcohol or use drugs.   Review of Systems  Constitutional: Positive for weight gain.  HENT: Negative for trouble swallowing.   Respiratory: Negative for shortness of breath.   Cardiovascular: Negative for leg swelling.  Gastrointestinal: Negative for diarrhea.  Endocrine: Positive for fatigue.   Musculoskeletal: Positive for joint pain.  Neurological: Positive for weakness.       She has mild weakness in her extremities which she thinks is from her ankylosing spondylitis      Examination:   BP 100/80 (BP Location: Left Arm, Patient Position: Sitting, Cuff Size: Large)   Pulse 73   Ht 5\' 1"  (1.549 m)   Wt 179 lb (81.2 kg)   SpO2 98%   BMI 33.82 kg/m    General Appearance: pleasant, well built and nourished No cushingoid features        Eyes: No prominence or swelling.           Neck: The thyroid is nonpalpable.  There is no lymphadenopathy.     Cardiovascular: Normal  heart sounds, no murmur Respiratory:  Lungs clear Neurological: REFLEXES: at biceps are normal. No tremor present  Skin: no rash        Assessment/Plan:  Multinodular goiter with variably autonomous function  She does not have a palpable goiter but her scan previously indicated a mild enlargement of the right lobe and heterogenous uptake typical of multinodular goiter She also has a family history of goiter  She has currently no symptoms and her last TSH was low normal She has had 2 instances where her TSH has been in the hyperthyroid range but free T4 and free T3 in the past have always been normal  Currently she appears asymptomatic Discussed the nature of multinodular goiter and interpretation of thyroid function test Explained to the patient that since she usually has only minimally abnormal TSH and no evidence of hyperthyroidism she did not need any treatment or further evaluation  Thyroid function will be rechecked today including free T4 and T3 If they are normal she will follow-up annually with her PCP  She can be seen here in follow-up again if her TSH is completely suppressed in the hyperthyroid range  A copy of the consultation note is sent to the referring physician  Elayne Snare 10/19/2018  ADDENDUM: TSH is 0.25 with T4 and T3 very normal, will not need any further evaluation or  treatment

## 2018-10-21 ENCOUNTER — Telehealth: Payer: Self-pay | Admitting: Internal Medicine

## 2018-10-21 NOTE — Telephone Encounter (Signed)
Sonya Franklin called back to say that her CPAP Pressure setting is 6.6-12

## 2018-10-26 DIAGNOSIS — G4733 Obstructive sleep apnea (adult) (pediatric): Secondary | ICD-10-CM | POA: Diagnosis not present

## 2018-10-29 IMAGING — CR DG CERVICAL SPINE COMPLETE 4+V
6 series · 6 of 6 positions shown · non-contrast
Comparison: None.

CLINICAL DATA: Neck pain

EXAM:
CERVICAL SPINE - COMPLETE 4+ VIEW

[w cervical spine lat]
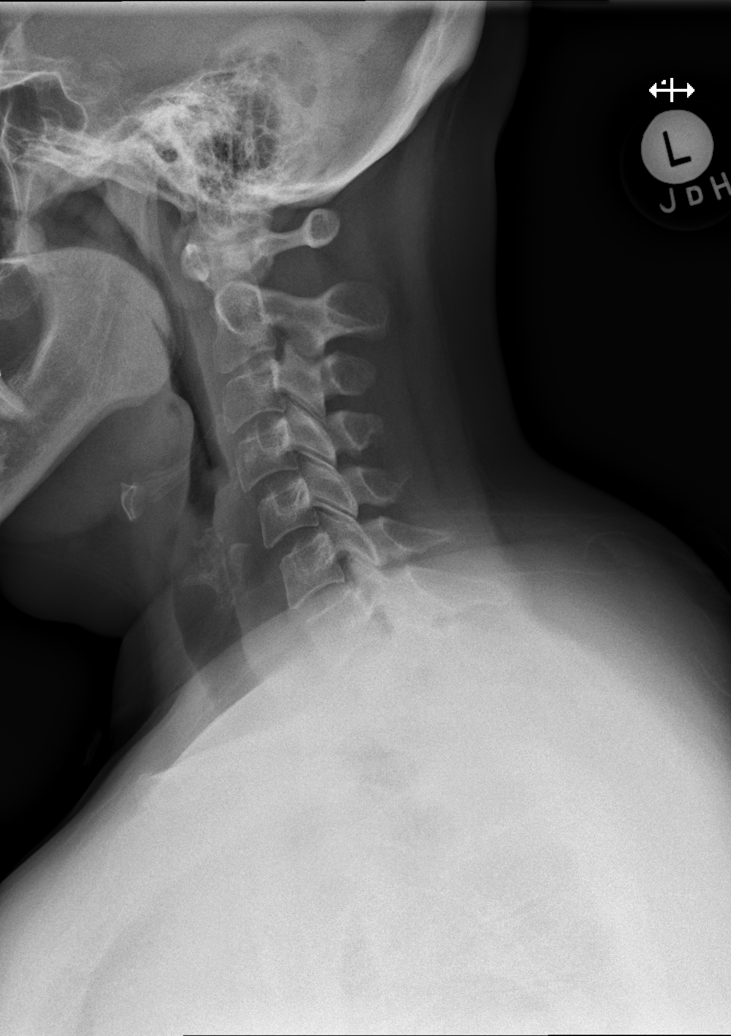

[w cervical spine ap_obl (1 of 2)]
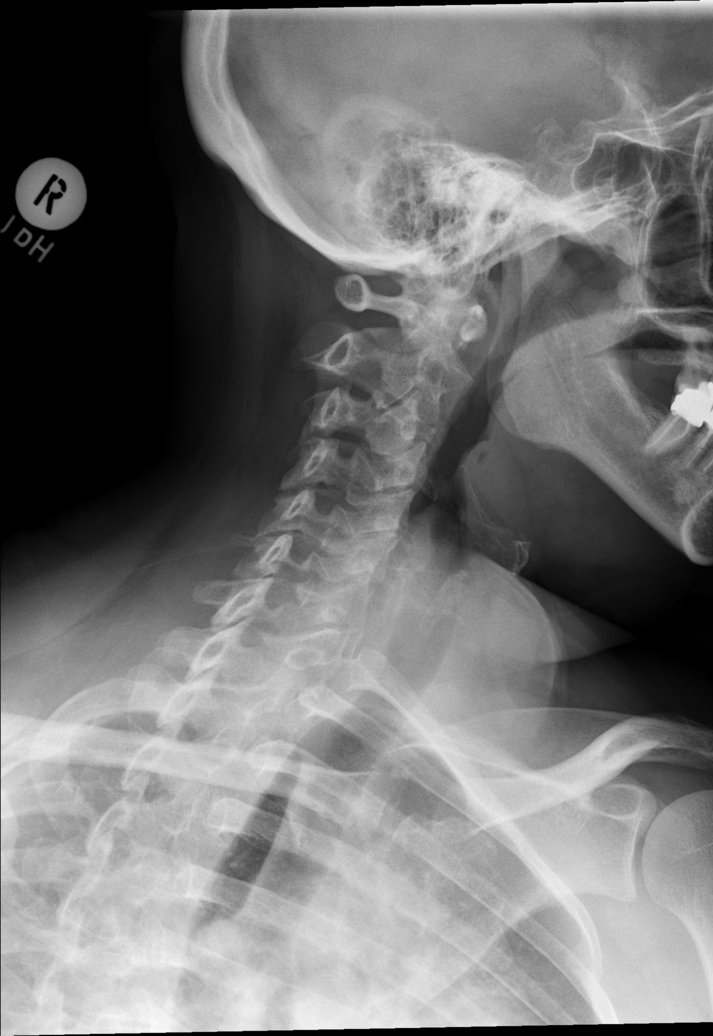

[w cervical spine ap_obl (2 of 2)]
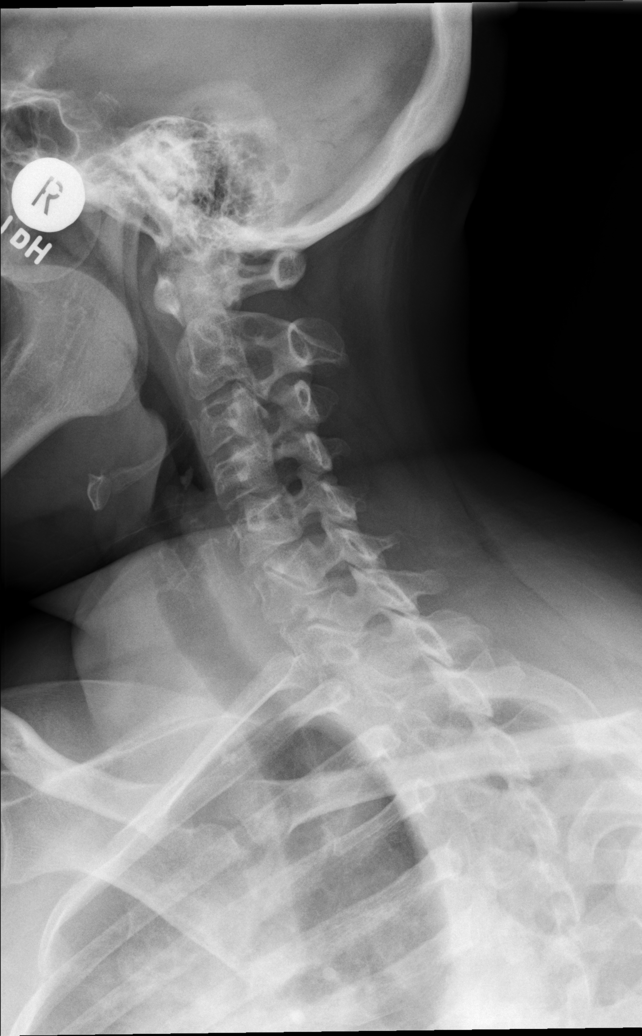

[w cervical spine ap]
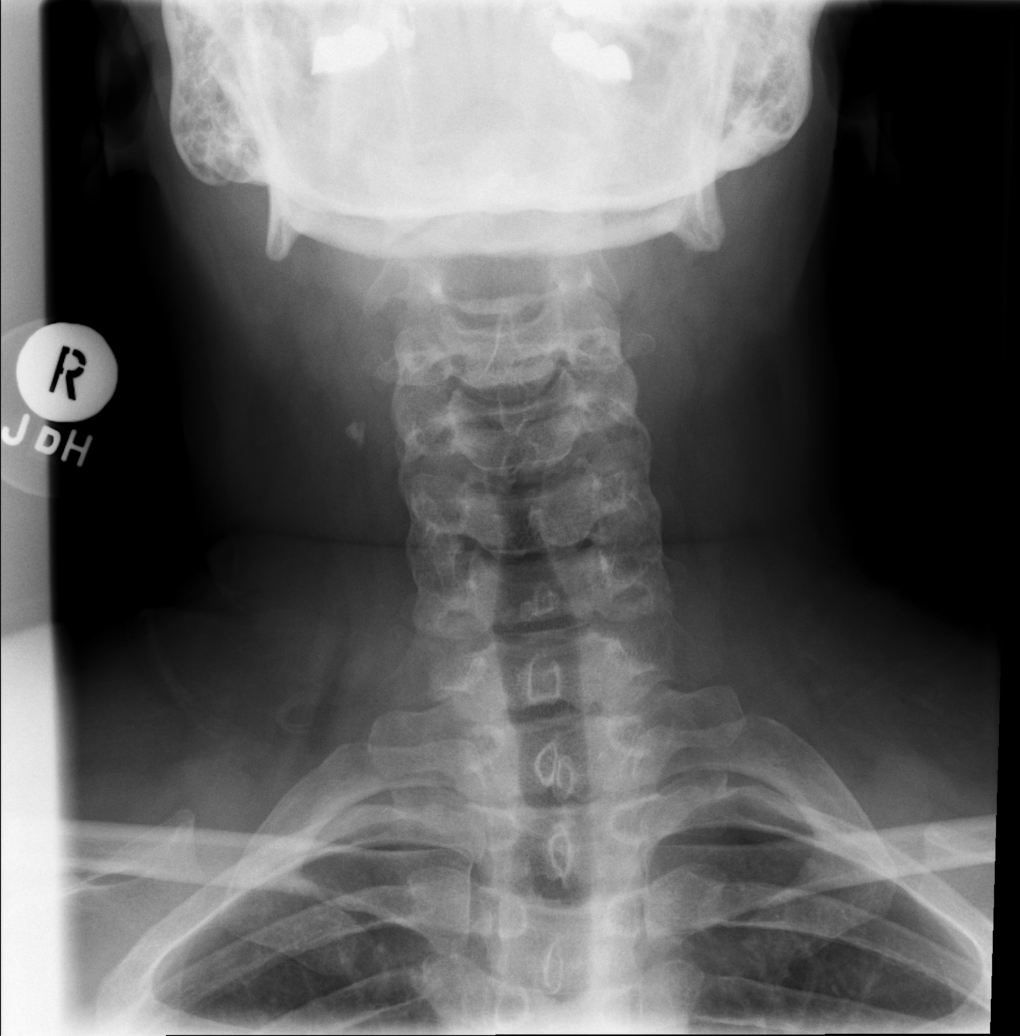

[w cervical swimmers]
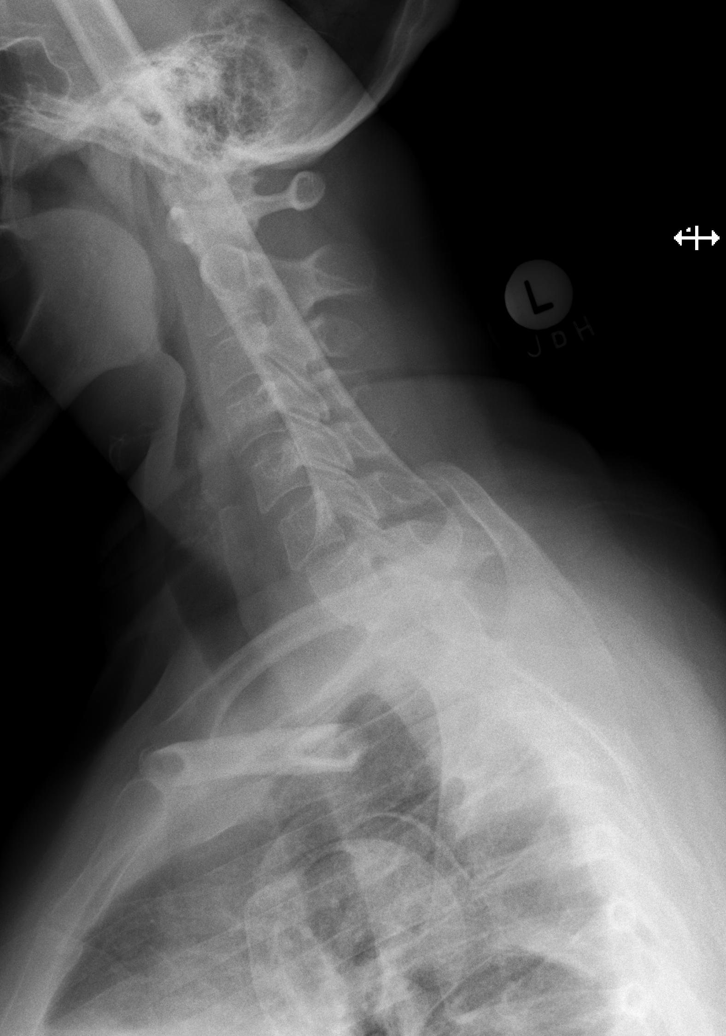

[t cervical spine odontoid]
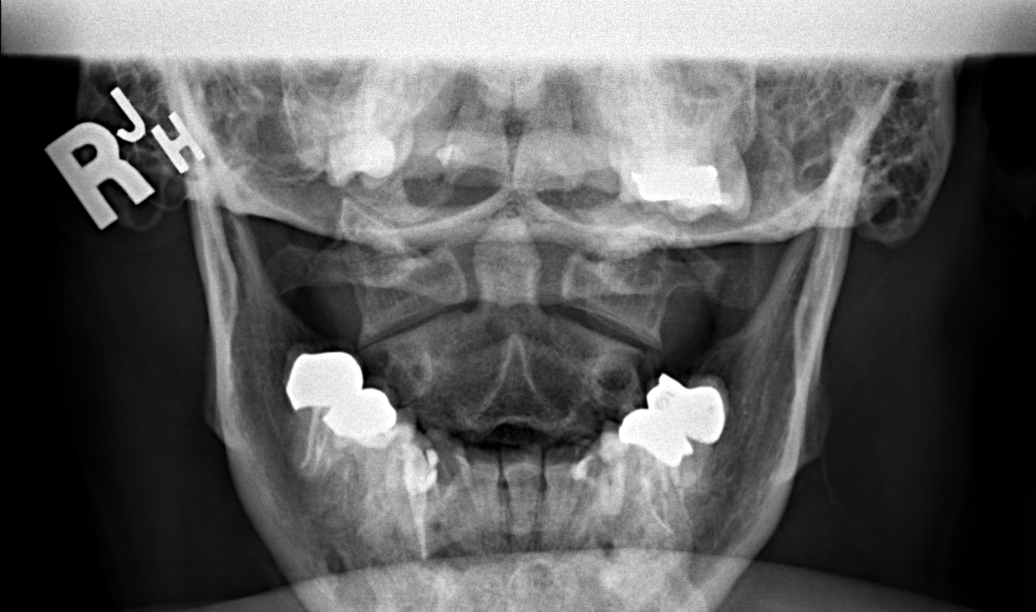

[6 of 6 positions shown; findings below may reference images not displayed]

FINDINGS: There is no evidence of cervical spine fracture or prevertebral soft
tissue swelling. Alignment is normal. No other significant bone
abnormalities are identified. Calcification right neck likely
atherosclerotic calcification right carotid.
IMPRESSION: Negative cervical spine radiographs.

Right carotid calcification

## 2018-12-22 ENCOUNTER — Ambulatory Visit (INDEPENDENT_AMBULATORY_CARE_PROVIDER_SITE_OTHER): Payer: Medicare Other | Admitting: Internal Medicine

## 2018-12-22 ENCOUNTER — Encounter: Payer: Self-pay | Admitting: Internal Medicine

## 2018-12-22 ENCOUNTER — Other Ambulatory Visit: Payer: Self-pay

## 2018-12-22 VITALS — BP 120/70 | HR 90 | Temp 98.0°F | Ht 61.0 in | Wt 182.0 lb

## 2018-12-22 DIAGNOSIS — M459 Ankylosing spondylitis of unspecified sites in spine: Secondary | ICD-10-CM | POA: Diagnosis not present

## 2018-12-22 DIAGNOSIS — Z853 Personal history of malignant neoplasm of breast: Secondary | ICD-10-CM | POA: Diagnosis not present

## 2018-12-22 DIAGNOSIS — F411 Generalized anxiety disorder: Secondary | ICD-10-CM | POA: Diagnosis not present

## 2018-12-22 DIAGNOSIS — I10 Essential (primary) hypertension: Secondary | ICD-10-CM | POA: Diagnosis not present

## 2018-12-22 DIAGNOSIS — Z6834 Body mass index (BMI) 34.0-34.9, adult: Secondary | ICD-10-CM

## 2018-12-22 MED ORDER — METHYLPREDNISOLONE ACETATE 80 MG/ML IJ SUSP
80.0000 mg | Freq: Once | INTRAMUSCULAR | Status: AC
  Administered 2018-12-22: 80 mg via INTRAMUSCULAR

## 2018-12-22 MED ORDER — ALPRAZOLAM 0.25 MG PO TABS: 0.2500 mg | ORAL_TABLET | Freq: Two times a day (BID) | ORAL | 1 refills | Status: DC | PRN

## 2018-12-22 NOTE — Progress Notes (Signed)
   Subjective:    Patient ID: Sonya Franklin, female    DOB: [birth year], 53 y.o.   MRN: [id number]  HPI 53 year old Female in today with history of ankylosing spondylitis.  Basically puts up with the arthralgias but sometimes they get to be unbearable.  Generally gets a Depo-Medrol injection every few months and that seems to work well for her.  She last had an injection in June.  She has a history of multinodular goiter followed by Dr. Dwyane Dee.  History of sleep apnea treated with CPAP.  Remote history of breast cancer.  She has a history of anxiety treated with Xanax.  Hypertension treated with amlodipine.    Review of Systems diffuse myalgias and arthralgias     Objective:   Physical Exam  Tenderness over SI joints.  Blood pressure stable at 120/70 on medication.  BMI 34.39      Assessment & Plan:  Ankylosing spondylitis  Hypertension-stable on amlodipine  History of goiter followed by Dr. Dwyane Dee  Anxiety treated with Xanax  BMI 34 needs to watch diet and get some exercise  Plan: Depo-Medrol 80 mg IM.  We have scheduled her physical exam appointment for January 2021.

## 2018-12-27 ENCOUNTER — Other Ambulatory Visit: Payer: Self-pay | Admitting: Internal Medicine

## 2019-01-14 NOTE — Patient Instructions (Signed)
Depo-Medrol 80 mg IM given in office for arthralgias.  Follow-up in January for health maintenance exam and fasting labs.  No change in medications.

## 2019-01-16 ENCOUNTER — Other Ambulatory Visit: Payer: Self-pay

## 2019-01-16 DIAGNOSIS — Z0000 Encounter for general adult medical examination without abnormal findings: Secondary | ICD-10-CM

## 2019-01-16 DIAGNOSIS — R7302 Impaired glucose tolerance (oral): Secondary | ICD-10-CM

## 2019-01-30 ENCOUNTER — Telehealth: Payer: Self-pay | Admitting: Internal Medicine

## 2019-01-30 NOTE — Telephone Encounter (Signed)
LVM to CB and schedule AMW appointment by the end of the year

## 2019-01-31 DIAGNOSIS — G4733 Obstructive sleep apnea (adult) (pediatric): Secondary | ICD-10-CM | POA: Diagnosis not present

## 2019-02-01 ENCOUNTER — Other Ambulatory Visit: Payer: Self-pay

## 2019-02-01 MED ORDER — VITAMIN D (ERGOCALCIFEROL) 1.25 MG (50000 UNIT) PO CAPS: ORAL_CAPSULE | ORAL | 1 refills | Status: DC

## 2019-02-03 ENCOUNTER — Ambulatory Visit (INDEPENDENT_AMBULATORY_CARE_PROVIDER_SITE_OTHER): Payer: Medicare Other | Admitting: Internal Medicine

## 2019-02-03 ENCOUNTER — Other Ambulatory Visit: Payer: Self-pay

## 2019-02-03 ENCOUNTER — Encounter: Payer: Self-pay | Admitting: Internal Medicine

## 2019-02-03 ENCOUNTER — Telehealth: Payer: Self-pay | Admitting: Internal Medicine

## 2019-02-03 VITALS — BP 120/80 | HR 86 | Temp 97.7°F | Ht 61.0 in | Wt 182.0 lb

## 2019-02-03 VITALS — BP 120/70 | HR 74 | Temp 98.0°F | Ht 61.0 in | Wt 182.0 lb

## 2019-02-03 DIAGNOSIS — R300 Dysuria: Secondary | ICD-10-CM | POA: Diagnosis not present

## 2019-02-03 DIAGNOSIS — R7302 Impaired glucose tolerance (oral): Secondary | ICD-10-CM | POA: Diagnosis not present

## 2019-02-03 DIAGNOSIS — Z0000 Encounter for general adult medical examination without abnormal findings: Secondary | ICD-10-CM | POA: Diagnosis not present

## 2019-02-03 DIAGNOSIS — N342 Other urethritis: Secondary | ICD-10-CM | POA: Diagnosis not present

## 2019-02-03 MED ORDER — CIPROFLOXACIN HCL 500 MG PO TABS: 500.0000 mg | ORAL_TABLET | Freq: Two times a day (BID) | ORAL | 0 refills | Status: DC

## 2019-02-03 NOTE — Telephone Encounter (Signed)
She needs nurse visit and urine dipstick. Is she constipated?

## 2019-02-03 NOTE — Telephone Encounter (Signed)
Scheduled nurse visit.

## 2019-02-03 NOTE — Patient Instructions (Addendum)
Cipro 500 mg  twice daily x 3 days.

## 2019-02-03 NOTE — Telephone Encounter (Signed)
Sonya Franklin [phone]  Sonya Franklin called to say that sometime last week she started having some discomfort on the right side, she has frequent urination, right side feels heavy, if she drinks alcohol it really flares it up. She has some amoxicillin at home would this help. She has been trying to drink water and cranberry juice, not helping.

## 2019-02-03 NOTE — Progress Notes (Signed)
   Subjective:    Patient ID: Sonya Franklin, female    DOB: 11-30-1965, 53 y.o.   MRN: [id number]  HPI Patient here for complaint of urinary frequency and pressure in right lower quadrant. No fever,chills, nausea or vomiting. Did some yard work and could have strained abdominal muscles. Urine dipstick is negative.     Review of Systems see above. Patient has chronic musculoskeletal pain. Asking about another injection of Depomedrol but prefer not to at this time with surge in pandemic.     Objective:   Physical Exam  Afebrile. Urine dipstick is negative.      Assessment & Plan:  Urinary frequency  Possible urethritis  Plan: Cipro 500 mg twice a day x 3 days.

## 2019-02-04 LAB — HEMOGLOBIN A1C
Hgb A1c MFr Bld: 5.8 % of total Hgb — ABNORMAL HIGH (ref <5.7)
Mean Plasma Glucose: 120 (calc)
eAG (mmol/L): 6.6 (calc)

## 2019-02-04 LAB — MICROALBUMIN / CREATININE URINE RATIO
Creatinine, Urine: 99 mg/dL (ref 20–275)
Microalb Creat Ratio: 29 mcg/mg creat (ref <30)
Microalb, Ur: 2.9 mg/dL

## 2019-02-06 LAB — POCT URINALYSIS DIPSTICK
Appearance: NEGATIVE
Bilirubin, UA: NEGATIVE
Blood, UA: NEGATIVE
Glucose, UA: NEGATIVE
Ketones, UA: NEGATIVE
Leukocytes, UA: NEGATIVE
Nitrite, UA: NEGATIVE
Odor: NEGATIVE
Protein, UA: NEGATIVE
Spec Grav, UA: 1.01 (ref 1.010–1.025)
Urobilinogen, UA: 0.2 E.U./dL
pH, UA: 6.5 (ref 5.0–8.0)

## 2019-02-06 NOTE — Telephone Encounter (Signed)
Schedule OV tomorrow

## 2019-02-06 NOTE — Telephone Encounter (Signed)
Sonya Franklin called to say she had finished medicine yesterday and she is still experiencing a lot of discomfort. She is wandering does she need more medicine or to give it a few more days. She is worried with the Holiday coming up.

## 2019-02-07 ENCOUNTER — Encounter: Payer: Self-pay | Admitting: Internal Medicine

## 2019-02-07 ENCOUNTER — Other Ambulatory Visit: Payer: Self-pay

## 2019-02-07 ENCOUNTER — Ambulatory Visit (INDEPENDENT_AMBULATORY_CARE_PROVIDER_SITE_OTHER): Payer: Medicare Other | Admitting: Internal Medicine

## 2019-02-07 ENCOUNTER — Ambulatory Visit (HOSPITAL_COMMUNITY)
Admission: RE | Admit: 2019-02-07 | Discharge: 2019-02-07 | Disposition: A | Discharge status: Home / Self Care | Payer: Medicare Other | Source: Ambulatory Visit | Attending: Internal Medicine | Admitting: Internal Medicine

## 2019-02-07 VITALS — BP 120/80 | HR 84 | Temp 98.1°F | Ht 61.0 in | Wt 180.0 lb

## 2019-02-07 DIAGNOSIS — R1031 Right lower quadrant pain: Secondary | ICD-10-CM | POA: Insufficient documentation

## 2019-02-07 LAB — CBC WITH DIFFERENTIAL/PLATELET
Absolute Monocytes: 774 cells/uL (ref 200–950)
Basophils Absolute: 26 cells/uL (ref 0–200)
Basophils Relative: 0.3 %
Eosinophils Absolute: 60 cells/uL (ref 15–500)
Eosinophils Relative: 0.7 %
HCT: 35.8 % (ref 35.0–45.0)
Hemoglobin: 11.9 g/dL (ref 11.7–15.5)
Lymphs Abs: 1238 cells/uL (ref 850–3900)
MCH: 28.3 pg (ref 27.0–33.0)
MCHC: 33.2 g/dL (ref 32.0–36.0)
MCV: 85.2 fL (ref 80.0–100.0)
MPV: 8.9 fL (ref 7.5–12.5)
Monocytes Relative: 9 %
Neutro Abs: 6502 cells/uL (ref 1500–7800)
Neutrophils Relative %: 75.6 %
Platelets: 290 10*3/uL (ref 140–400)
RBC: 4.2 10*6/uL (ref 3.80–5.10)
RDW: 13.3 % (ref 11.0–15.0)
Total Lymphocyte: 14.4 %
WBC: 8.6 10*3/uL (ref 3.8–10.8)

## 2019-02-07 LAB — BASIC METABOLIC PANEL
BUN/Creatinine Ratio: 20 (calc) (ref 6–22)
BUN: 24 mg/dL (ref 7–25)
CO2: 24 mmol/L (ref 20–32)
Calcium: 9.2 mg/dL (ref 8.6–10.4)
Chloride: 107 mmol/L (ref 98–110)
Creat: 1.22 mg/dL — ABNORMAL HIGH (ref 0.50–1.05)
Glucose, Bld: 134 mg/dL — ABNORMAL HIGH (ref 65–99)
Potassium: 3.8 mmol/L (ref 3.5–5.3)
Sodium: 142 mmol/L (ref 135–146)

## 2019-02-07 LAB — POCT URINALYSIS DIPSTICK
Appearance: NEGATIVE
Bilirubin, UA: NEGATIVE
Blood, UA: NEGATIVE
Glucose, UA: NEGATIVE
Ketones, UA: NEGATIVE
Leukocytes, UA: NEGATIVE
Nitrite, UA: NEGATIVE
Odor: NEGATIVE
Protein, UA: POSITIVE — AB
Spec Grav, UA: 1.015 (ref 1.010–1.025)
Urobilinogen, UA: 0.2 E.U./dL
pH, UA: 6 (ref 5.0–8.0)

## 2019-02-07 MED ORDER — IOHEXOL 9 MG/ML PO SOLN
ORAL | Status: AC
  Filled 2019-02-07: qty 500

## 2019-02-07 MED ORDER — IOHEXOL 300 MG/ML  SOLN
100.0000 mL | Freq: Once | INTRAMUSCULAR | Status: AC | PRN
  Administered 2019-02-07: 100 mL via INTRAVENOUS

## 2019-02-07 MED ORDER — SODIUM CHLORIDE (PF) 0.9 % IJ SOLN
INTRAMUSCULAR | Status: AC
  Filled 2019-02-07: qty 50

## 2019-02-07 MED ORDER — IOHEXOL 9 MG/ML PO SOLN
1000.0000 mL | ORAL | Status: AC
  Administered 2019-02-07: 1000 mL via ORAL

## 2019-02-07 NOTE — Patient Instructions (Signed)
Request CT of abdomen and pelvis to rule out acute appendicitis or diverticulitis

## 2019-02-07 NOTE — Progress Notes (Signed)
   Subjective:    Patient ID: Sonya Franklin, female    DOB: 1965/09/16, 53 y.o.   MRN: [id number]  HPI  53 year old Female with RLQ pain. About 3 weeks ago took Amoxicillin for a dental problem. Thinks it made her constipated. Was here last week, I  thought she had urethritis and treated with 3 days of Cipro. Was here for lab draw only but saw her briefly. She is back today saying she has persistent heaviness in RLQ and discomfort when walking. She is worried about an acuyte appendicitis. Cannot recall day of last BM maybe last Friday.Thinks maybe Cipro helped a little.  Ate chicken noodle soup and frape. Says lower abdomen feels heavy.  Had colonoscopy 1999 which was negative. In November 2013, was admitted with abdominal pain. CT showed infalmmatory process. Stoool for Cdiff was negative. She was treated with Cipro and Flagyl and improved. Colonoscopy in 2014 was normal.  History of ankylosing spondylitis and remote history of breast cancer.Hx of impaired glucose tolerance.  In May 2018 had campylobacter infection requiring admission to hospital due to volume depletion.  Had total hysterectomy 2008. Right breast cancer 2007. Breast reconstruction 2008.  Review of Systems No nausea and vomiting. No fever or chills.     Objective:   Physical Exam BP 120/80 T 98.1 degrees.Abdomen is obese soft nondistended without palpable mass but tender in right lower quadrant to palpation.Chest is clear. Cor: RRR. No LE edema       Assessment & Plan:  Persistent RLQ pain not responding to 3 days of Cipro. Need to rule out kidney stone, acute appendicitis or diverticulitis.Order CT of abdomen with oral and IV contrast.

## 2019-02-10 ENCOUNTER — Encounter: Payer: Self-pay | Admitting: Internal Medicine

## 2019-02-10 NOTE — Patient Instructions (Addendum)
Medicare wellness visit completed.  Follow-up 2021 for in-office health maintenance exam and fasting lab work

## 2019-02-10 NOTE — Progress Notes (Signed)
Subjective:    Patient ID: Sonya Franklin, female    DOB: [birth year], 53 y.o.   MRN: [id number]  HPI 53 year old Female seen today for urinary issue but needs medicare Wellness visit. This is being done today in addition to her being seen in person for an acute visit.   Medical issues include ankylosing spondylitis for which she receives Depo-Medrol IM from time to time.  She has a history of multinodular goiter and is seeing Dr. Dwyane Dee for management.  Had biopsy in 2006 of complex thyroid nodule right inferior lobe which showed benign colloid nodule.  She has mild hypertension treated with amlodipine.  History of GE reflux treated with Protonix.  History of vitamin D deficiency treated with high-dose vitamin D supplement.  Takes ibuprofen for musculoskeletal pain and Xanax for anxiety.  History of abdominal pain 2013 and CT showed inflammatory process with negative stool studies for C. difficile.  Has had recurrent episodes of abdominal pain that responded to steroids.  A colonoscopy by Dr. Fuller Plan in 2014 that was normal.  Repeat study recommended in 10 years at that time.  She resides with her special needs son.  She is divorced.  Non-smoker.  No alcohol consumption.  History of right breast cancer 2007.  She had adjuvant chemotherapy.  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  History of fibromyalgia syndrome with chronic musculoskeletal pain.   Review of Systems chronic musculoskeletal pain     Objective:   Physical Exam  BP 120/80 weight stable at 182 pounds. BMI 34.39.  Does not exercise very much due to chronic musculoskeletal pain.      Assessment & Plan:  Essential hypertension-stable on current regimen  Chronic musculoskeletal pain with history of ankylosing spondylitis.  Sparingly with Depo-Medrol from time to time  History of breast cancer  Plan: Recommend health maintenance exam and fasting labs within the next 6 months.  Subjective:    Patient presents for Medicare Annual/Subsequent preventive examination.  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 No  Over the past two weeks, have you felt little interest or pleasure in doing things? No Have you lost interest or pleasure in daily life?  Yes due to the pandemic Do you often feel hopeless? No Do you cry easily over simple problems? No   Activities of Daily Living  In your present state of health, do you have any difficulty performing the following activities?:   Driving?  Sometimes on busy highways Managing money? No  Feeding yourself? No  Getting from bed to chair? No  Climbing a flight of stairs? No  Preparing food and eating?: No  Bathing or showering? No  Getting dressed: No  Getting to the toilet? No  Using the toilet:No  Moving around from place to place: Moves slowly due to pain In the past year have you fallen or had a near fall?:No  Are you sexually active? No  Do you have more than one partner? No   Hearing Difficulties: No  Do you often ask people to speak up or repeat  themselves? Not unless people are wearing masks then it is difficult to hear them Do you experience ringing or noises in your ears?  Yes Do you have difficulty understanding soft or whispered voices? No  Do you feel that you have a problem with memory?  Yes sometimes Do you often misplace items?  Yes   Home Safety:  Do you have a smoke alarm at your residence? Yes Do you have grab bars in the bathroom?  No Do you have throw rugs in your house?  No   Cognitive  Testing  Alert? Yes Normal Appearance?Yes  Oriented to person? Yes Place? Yes  Time? Yes  Recall of three objects? Yes  Can perform simple calculations? Yes  Displays appropriate judgment?Yes  Can read the correct time from a watch face?Yes   List the Names of Other Physician/Practitioners you currently use:  See referral list for the physicians patient is currently seeing.     Review of Systems: See above   Objective:     General appearance: Appears stated age and obese  Head: Normocephalic, without obvious abnormality, atraumatic  Eyes: conj clear, EOMi PEERLA  Ears: normal TM's and external ear canals both ears  Nose: Nares normal. Septum midline. Mucosa normal. No drainage or sinus tenderness.  Throat: lips, mucosa, and tongue normal; teeth and gums normal  Neck: no adenopathy, no carotid bruit, no JVD, supple, symmetrical, trachea midline and thyroid not enlarged, symmetric, no tenderness/mass/nodules  No CVA tenderness.  Lungs: clear to auscultation bilaterally  Breasts: Status post breast cancer surgery Heart: regular rate and rhythm, S1, S2 normal, no murmur, click, rub or gallop  Abdomen: soft, non-tender; bowel sounds normal; no masses, no organomegaly  Musculoskeletal: ROM normal in all joints, no crepitus, no deformity, Normal muscle strengthen. Back  is symmetric, no curvature. Skin: Skin color, texture, turgor normal. No rashes or lesions  Lymph nodes: Cervical, supraclavicular, and axillary nodes normal.  Neurologic: CN 2 -12 Normal, Normal symmetric reflexes. Normal coordination and gait  Psych: Alert & Oriented x 3, Mood appear stable.    Assessment:    Annual wellness medicare exam   Plan:    During the course of the visit the patient was educated and counseled about appropriate screening and preventive services including:   Recommend annual flu vaccine  Recommend annual mammogram  Colonoscopy is due in 2024     Patient Instructions (the  written plan) was given to the patient.  Medicare Attestation  I have personally reviewed:  The patient's medical and social history  Their use of alcohol, tobacco or illicit drugs  Their current medications and supplements  The patient's functional ability including ADLs,fall risks, home safety risks, cognitive, and hearing and visual impairment  Diet and physical activities  Evidence for depression or mood disorders  The patient's weight, height, BMI, and visual acuity have been recorded in the chart. I have made referrals, counseling, and provided education to the patient based on review of the above and I have provided the patient with a written personalized care plan for preventive services.

## 2019-03-06 DIAGNOSIS — R350 Frequency of micturition: Secondary | ICD-10-CM | POA: Diagnosis not present

## 2019-03-31 ENCOUNTER — Other Ambulatory Visit: Payer: Self-pay

## 2019-03-31 ENCOUNTER — Other Ambulatory Visit: Payer: Medicare Other | Admitting: Internal Medicine

## 2019-03-31 DIAGNOSIS — M450 Ankylosing spondylitis of multiple sites in spine: Secondary | ICD-10-CM | POA: Diagnosis not present

## 2019-03-31 DIAGNOSIS — Z1329 Encounter for screening for other suspected endocrine disorder: Secondary | ICD-10-CM

## 2019-03-31 DIAGNOSIS — F329 Major depressive disorder, single episode, unspecified: Secondary | ICD-10-CM

## 2019-03-31 DIAGNOSIS — Z6834 Body mass index (BMI) 34.0-34.9, adult: Secondary | ICD-10-CM

## 2019-03-31 DIAGNOSIS — F419 Anxiety disorder, unspecified: Secondary | ICD-10-CM

## 2019-03-31 DIAGNOSIS — Z0000 Encounter for general adult medical examination without abnormal findings: Secondary | ICD-10-CM

## 2019-03-31 DIAGNOSIS — I10 Essential (primary) hypertension: Secondary | ICD-10-CM | POA: Diagnosis not present

## 2019-03-31 DIAGNOSIS — R7302 Impaired glucose tolerance (oral): Secondary | ICD-10-CM | POA: Diagnosis not present

## 2019-03-31 DIAGNOSIS — Z853 Personal history of malignant neoplasm of breast: Secondary | ICD-10-CM

## 2019-03-31 DIAGNOSIS — F32A Depression, unspecified: Secondary | ICD-10-CM

## 2019-04-01 LAB — HEMOGLOBIN A1C
Hgb A1c MFr Bld: 5.8 % of total Hgb — ABNORMAL HIGH (ref <5.7)
Mean Plasma Glucose: 120 (calc)
eAG (mmol/L): 6.6 (calc)

## 2019-04-01 LAB — CBC WITH DIFFERENTIAL/PLATELET
Absolute Monocytes: 490 cells/uL (ref 200–950)
Basophils Absolute: 40 cells/uL (ref 0–200)
Basophils Relative: 0.7 %
Eosinophils Absolute: 143 cells/uL (ref 15–500)
Eosinophils Relative: 2.5 %
HCT: 34.2 % — ABNORMAL LOW (ref 35.0–45.0)
Hemoglobin: 11.2 g/dL — ABNORMAL LOW (ref 11.7–15.5)
Lymphs Abs: 1345 cells/uL (ref 850–3900)
MCH: 28.1 pg (ref 27.0–33.0)
MCHC: 32.7 g/dL (ref 32.0–36.0)
MCV: 85.7 fL (ref 80.0–100.0)
MPV: 8.8 fL (ref 7.5–12.5)
Monocytes Relative: 8.6 %
Neutro Abs: 3682 cells/uL (ref 1500–7800)
Neutrophils Relative %: 64.6 %
Platelets: 291 10*3/uL (ref 140–400)
RBC: 3.99 10*6/uL (ref 3.80–5.10)
RDW: 13.9 % (ref 11.0–15.0)
Total Lymphocyte: 23.6 %
WBC: 5.7 10*3/uL (ref 3.8–10.8)

## 2019-04-01 LAB — LIPID PANEL
Cholesterol: 250 mg/dL — ABNORMAL HIGH (ref <200)
HDL: 91 mg/dL (ref >=50)
LDL Cholesterol (Calc): 143 mg/dL (calc) — ABNORMAL HIGH
Non-HDL Cholesterol (Calc): 159 mg/dL (calc) — ABNORMAL HIGH (ref <130)
Total CHOL/HDL Ratio: 2.7 (calc) (ref <5.0)
Triglycerides: 69 mg/dL (ref <150)

## 2019-04-01 LAB — COMPLETE METABOLIC PANEL WITH GFR
AG Ratio: 1.5 (calc) (ref 1.0–2.5)
ALT: 19 U/L (ref 6–29)
AST: 16 U/L (ref 10–35)
Albumin: 4.4 g/dL (ref 3.6–5.1)
Alkaline phosphatase (APISO): 89 U/L (ref 37–153)
BUN: 9 mg/dL (ref 7–25)
CO2: 27 mmol/L (ref 20–32)
Calcium: 9.4 mg/dL (ref 8.6–10.4)
Chloride: 106 mmol/L (ref 98–110)
Creat: 0.72 mg/dL (ref 0.50–1.05)
GFR, Est African American: 111 mL/min/{1.73_m2} (ref >=60)
GFR, Est Non African American: 96 mL/min/{1.73_m2} (ref >=60)
Globulin: 2.9 g/dL (calc) (ref 1.9–3.7)
Glucose, Bld: 95 mg/dL (ref 65–99)
Potassium: 4 mmol/L (ref 3.5–5.3)
Sodium: 141 mmol/L (ref 135–146)
Total Bilirubin: 0.5 mg/dL (ref 0.2–1.2)
Total Protein: 7.3 g/dL (ref 6.1–8.1)

## 2019-04-01 LAB — TSH: TSH: 0.76 mIU/L

## 2019-04-07 ENCOUNTER — Other Ambulatory Visit: Payer: Self-pay

## 2019-04-07 ENCOUNTER — Encounter: Payer: Self-pay | Admitting: Internal Medicine

## 2019-04-07 ENCOUNTER — Ambulatory Visit (INDEPENDENT_AMBULATORY_CARE_PROVIDER_SITE_OTHER): Payer: Medicare Other | Admitting: Internal Medicine

## 2019-04-07 VITALS — BP 120/74 | HR 75 | Temp 98.2°F | Ht 61.0 in | Wt 185.0 lb

## 2019-04-07 DIAGNOSIS — Z853 Personal history of malignant neoplasm of breast: Secondary | ICD-10-CM

## 2019-04-07 DIAGNOSIS — I10 Essential (primary) hypertension: Secondary | ICD-10-CM

## 2019-04-07 DIAGNOSIS — F411 Generalized anxiety disorder: Secondary | ICD-10-CM

## 2019-04-07 DIAGNOSIS — R7302 Impaired glucose tolerance (oral): Secondary | ICD-10-CM | POA: Diagnosis not present

## 2019-04-07 DIAGNOSIS — M450 Ankylosing spondylitis of multiple sites in spine: Secondary | ICD-10-CM

## 2019-04-07 DIAGNOSIS — J0100 Acute maxillary sinusitis, unspecified: Secondary | ICD-10-CM | POA: Diagnosis not present

## 2019-04-07 DIAGNOSIS — Z0000 Encounter for general adult medical examination without abnormal findings: Secondary | ICD-10-CM | POA: Diagnosis not present

## 2019-04-07 DIAGNOSIS — E7800 Pure hypercholesterolemia, unspecified: Secondary | ICD-10-CM

## 2019-04-07 LAB — POCT URINALYSIS DIPSTICK
Appearance: NEGATIVE
Bilirubin, UA: NEGATIVE
Blood, UA: NEGATIVE
Glucose, UA: NEGATIVE
Ketones, UA: NEGATIVE
Leukocytes, UA: NEGATIVE
Nitrite, UA: NEGATIVE
Odor: NEGATIVE
Protein, UA: NEGATIVE
Spec Grav, UA: 1.01 (ref 1.010–1.025)
Urobilinogen, UA: 0.2 U/dL
pH, UA: 6.5 (ref 5.0–8.0)

## 2019-04-07 MED ORDER — METHYLPREDNISOLONE ACETATE 80 MG/ML IJ SUSP
80.0000 mg | Freq: Once | INTRAMUSCULAR | Status: AC
  Administered 2019-04-07: 80 mg via INTRAMUSCULAR

## 2019-04-07 MED ORDER — ROSUVASTATIN CALCIUM 5 MG PO TABS: ORAL_TABLET | ORAL | 2 refills | Status: DC

## 2019-04-07 MED ORDER — AZITHROMYCIN 250 MG PO TABS: ORAL_TABLET | ORAL | 0 refills | Status: DC

## 2019-04-07 NOTE — Patient Instructions (Signed)
Start Crestor 5 mg 3 times a week and follow-up in 8 weeks with lipid panel liver functions without office visit.  Depo-Medrol 80 mg IM given in office today for musculoskeletal pain/ankylosing spondylitis.  Continue current medications for hypertension

## 2019-04-07 NOTE — Progress Notes (Signed)
Subjective:    Patient ID: Sonya Franklin, female    DOB: Sep 29, 1965, 54 y.o.   MRN: [id number]  HPI 54 year old Female for health maintenance exam, Medicare wellness, and evaluation of medical issues. Has been staying at home and occasionally goes to [place] to grocery store during off hours. No known Covid-19 exposure and says she will not take vaccine yet if offered it.  Medical issues include ankylosing spondylolysis treated with Depo-Medrol IM from time to time.  History of multinodular goiter and has seen Dr. Dwyane Dee for management but not recently.  Had biopsy in 2006 of complex thyroid nodule right inferior lobe which showed benign colloid nodule.  She has mild hypertension treated with amlodipine.  History of GE reflux treated with Protonix.  History of vitamin D deficiency.  Takes ibuprofen for musculoskeletal pain and Xanax for anxiety.  History of abdominal pain 2013 and CT showed inflammatory process with negative stool studies for C. difficile.  Has had recurrent episodes of abdominal pain that responded to steroids.  Colonoscopy by Dr. Fuller Plan in 2014 was normal.  Repeat study recommended in 2024.  History of right breast cancer 2007.  She had adjuvant chemotherapy.  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  Has done well since that time.  However was not able to return to work after treatment.  She underwent a right lumpectomy and sentinel node biopsy in May 2007 for T2 N0 grade 2 invasive ductal carcinoma.  Tumor was strongly ER/PR positive HER-2/neu negative.  She had 4 cycles of doxorubicin and cyclophosphamide followed by weekly Taxol x4.  She is status post bilateral mastectomies November 2007 with implant reconstruction and no evidence of residual disease.  She took Femara briefly in 2012 and decided to discontinue it.  Prior to that she took tamoxifen from January 2008 until June 2011.  History of mild to moderate osteopenia on bone  density study in 2012.  T score was -1.5 in the LS spine and -0.7 in the left femoral neck  Social history: She receives disability benefits for history of breast cancer 2007 and ankylosing spondylitis.  She resides with special needs son.  She is divorced.  Non-smoker.  No alcohol consumption.  Family history: Father with history of prostate cancer.  Patient's mother was diagnosed with breast cancer at age 35.  Maternal aunt with history of breast cancer.  3 brothers.  Review of Systems complains of postnasal drip and right ear discomfort Has usual musculoskeletal pain and would like injection of Depomedrol which was given today. It helps her pain immensely.     Objective:   Physical Exam Vital signs reviewed.  Skin warm and dry.  Nodes none.  TMs and pharynx are clear.  Neck is supple without JVD thyromegaly or carotid bruits.  Chest clear to auscultation.  Cardiac exam regular rate and rhythm normal S1 and S2.  Abdomen no hepatosplenomegaly masses or tenderness.  Extremities without deformity or edema.  Neuro no focal deficits on brief neurological exam.  Affect, thought process and behavior are normal.       Assessment & Plan:  Acute maxillary sinusitis-treat with Zithromax Z-PAK 2 p.o. day 1 followed by 1 p.o. days 2 through 5    Depo-Medrol IM given for ankylosing spondylosis-this also may help sinusitis   History of vitamin D deficiency-remain on 50,000 units vitamin D3 weekly  Hyperlipidemia-start Crestor 5 mg 3 times a week and follow-up in 8 weeks with  lipid panel liver functions without office visit  History of GE reflux treated with Protonix  Essential hypertension treated with amlodipine  Anxiety treated with Xanax twice daily as needed  History of ankylosing spondylitis treated with Depo-Medrol injections from time to time.  She also takes ibuprofen.  Remote history of breast cancer-stable and disease-free  Plan: Continue current medications.  She will return on a as  needed basis for Depo-Medrol injections which helps pain with ankylosing spondylitis.  Otherwise she will have repeat health maintenance exam in 1 year.  She had Medicare wellness exam in November 2020.

## 2019-04-13 ENCOUNTER — Telehealth: Payer: Self-pay | Admitting: Internal Medicine

## 2019-04-13 MED ORDER — AMLODIPINE BESYLATE 5 MG PO TABS: 5.0000 mg | ORAL_TABLET | Freq: Every day | ORAL | 0 refills | Status: DC

## 2019-04-13 NOTE — Telephone Encounter (Signed)
Received Fax RX request from  Pharmacy -  [id number] - [place], [address] Phone:  [phone]  Fax:  [phone]       Medication - amLODipine (NORVASC) 5 MG tablet   Last Refill - 01/17/19  Last OV - 04/06/2019  Last CPE - 04/06/2019  Next Appointment - 05/25/2019

## 2019-05-02 DIAGNOSIS — C50911 Malignant neoplasm of unspecified site of right female breast: Secondary | ICD-10-CM | POA: Diagnosis not present

## 2019-05-08 ENCOUNTER — Other Ambulatory Visit: Payer: Self-pay | Admitting: Internal Medicine

## 2019-05-25 ENCOUNTER — Other Ambulatory Visit: Payer: Medicare Other | Admitting: Internal Medicine

## 2019-06-01 ENCOUNTER — Other Ambulatory Visit: Payer: Medicare Other | Admitting: Internal Medicine

## 2019-06-01 ENCOUNTER — Other Ambulatory Visit: Payer: Self-pay

## 2019-06-01 DIAGNOSIS — E7800 Pure hypercholesterolemia, unspecified: Secondary | ICD-10-CM | POA: Diagnosis not present

## 2019-06-02 LAB — LIPID PANEL
Cholesterol: 208 mg/dL — ABNORMAL HIGH (ref <200)
HDL: 100 mg/dL (ref >=50)
LDL Cholesterol (Calc): 93 mg/dL (calc)
Non-HDL Cholesterol (Calc): 108 mg/dL (calc) (ref <130)
Total CHOL/HDL Ratio: 2.1 (calc) (ref <5.0)
Triglycerides: 63 mg/dL (ref <150)

## 2019-06-02 LAB — HEPATIC FUNCTION PANEL
AG Ratio: 1.6 (calc) (ref 1.0–2.5)
ALT: 17 U/L (ref 6–29)
AST: 13 U/L (ref 10–35)
Albumin: 4.4 g/dL (ref 3.6–5.1)
Alkaline phosphatase (APISO): 86 U/L (ref 37–153)
Bilirubin, Direct: 0.1 mg/dL (ref 0.0–0.2)
Globulin: 2.8 g/dL (calc) (ref 1.9–3.7)
Indirect Bilirubin: 0.3 mg/dL (calc) (ref 0.2–1.2)
Total Bilirubin: 0.4 mg/dL (ref 0.2–1.2)
Total Protein: 7.2 g/dL (ref 6.1–8.1)

## 2019-07-13 ENCOUNTER — Other Ambulatory Visit: Payer: Self-pay | Admitting: Internal Medicine

## 2019-07-21 ENCOUNTER — Telehealth: Payer: Self-pay

## 2019-07-21 MED ORDER — HYDROCHLOROTHIAZIDE 25 MG PO TABS: 25.0000 mg | ORAL_TABLET | Freq: Every day | ORAL | 0 refills | Status: DC

## 2019-07-21 NOTE — Telephone Encounter (Signed)
Rx was e-scribed, left detailed message, needs to call back to book appt for 30 days from now.

## 2019-07-21 NOTE — Telephone Encounter (Signed)
Patient called has been having swelling around her ankles for a week now no other symptoms, she said in the past she was given HZTC 25mg  but she is out. Please advise?

## 2019-07-21 NOTE — Telephone Encounter (Signed)
Please e-scribe HCTZ 25 mg daily.(#[id number]).Needs follow up in 30 days with OV and potassium at same time.

## 2019-07-31 DIAGNOSIS — G4733 Obstructive sleep apnea (adult) (pediatric): Secondary | ICD-10-CM | POA: Diagnosis not present

## 2019-08-28 ENCOUNTER — Other Ambulatory Visit: Payer: Self-pay

## 2019-08-28 ENCOUNTER — Encounter: Payer: Self-pay | Admitting: Internal Medicine

## 2019-08-28 ENCOUNTER — Ambulatory Visit (INDEPENDENT_AMBULATORY_CARE_PROVIDER_SITE_OTHER): Payer: Medicare Other | Admitting: Internal Medicine

## 2019-08-28 ENCOUNTER — Telehealth: Payer: Self-pay | Admitting: Internal Medicine

## 2019-08-28 VITALS — BP 120/80 | HR 84 | Temp 98.0°F | Ht 61.0 in | Wt 186.0 lb

## 2019-08-28 DIAGNOSIS — J0100 Acute maxillary sinusitis, unspecified: Secondary | ICD-10-CM

## 2019-08-28 DIAGNOSIS — M450 Ankylosing spondylitis of multiple sites in spine: Secondary | ICD-10-CM | POA: Diagnosis not present

## 2019-08-28 DIAGNOSIS — J309 Allergic rhinitis, unspecified: Secondary | ICD-10-CM

## 2019-08-28 DIAGNOSIS — Z853 Personal history of malignant neoplasm of breast: Secondary | ICD-10-CM

## 2019-08-28 DIAGNOSIS — W57XXXA Bitten or stung by nonvenomous insect and other nonvenomous arthropods, initial encounter: Secondary | ICD-10-CM

## 2019-08-28 DIAGNOSIS — S80861A Insect bite (nonvenomous), right lower leg, initial encounter: Secondary | ICD-10-CM

## 2019-08-28 MED ORDER — METHYLPREDNISOLONE ACETATE 80 MG/ML IJ SUSP
80.0000 mg | Freq: Once | INTRAMUSCULAR | Status: AC
  Administered 2019-08-28: 80 mg via INTRAMUSCULAR

## 2019-08-28 MED ORDER — TRIAMCINOLONE ACETONIDE 0.1 % EX CREA: 1.0000 "application " | TOPICAL_CREAM | Freq: Three times a day (TID) | CUTANEOUS | 0 refills | Status: DC

## 2019-08-28 MED ORDER — AZITHROMYCIN 250 MG PO TABS: ORAL_TABLET | ORAL | 0 refills | Status: DC

## 2019-08-28 NOTE — Telephone Encounter (Signed)
Sonya Franklin [phone]  [place] called to say her legs still feel tight and she feels light headed and off balance when she blows her nose for the last couple of weeks since she has been working in yard. Would like to come in for appointment. Had her 2nd vaccine in March.

## 2019-08-28 NOTE — Patient Instructions (Addendum)
Depomedrol 80 mg IM. Take Zyrtec for allergic rhinitis. Take Zithromax Z pack as directed. Triamcinolone cream to right leg 3 times a day. Take Crestor 5 mg one a week instead of 3 times a week.

## 2019-08-28 NOTE — Telephone Encounter (Signed)
Needs OV.  

## 2019-08-28 NOTE — Telephone Encounter (Signed)
Appointment scheduled.

## 2019-08-28 NOTE — Progress Notes (Signed)
   Subjective:    Patient ID: Sonya Franklin, female    DOB: [birth year], 54 y.o.   MRN: [id number]  HPI Here to day with complaint of nasal congestion.  No fever or shaking chills.  Had 2 Covid immunizations in April.  No travel history.  Discussed lab results results from March.  Total cholesterol was 208, HDL excellent at 100, triglycerides 63, LDL 93.  Liver functions were normal.  Was here in January for health maintenance exam.  History of ankylosing spondylitis for which she gets Depo-Medrol injections from time to time.  In January had acute maxillary sinusitis treated with Zithromax.  Also at that time starting Crestor 5 mg 3 times a week but she did not follow through with it because she said it made her legs feel heavy.  History of right lumpectomy May 2007 for invasive ductal carcinoma and has done well after chemotherapy.  Took tamoxifen for 3 years after chemotherapy and Femara briefly in 2012.  History of moderate osteopenia.  History of multinodular thyroid and used to see Dr. Dwyane Dee.   Review of Systems see above     Objective:   Physical Exam Blood pressure 120/80 pulse 84 temperature 98 degrees orally pulse oximetry 98% weight 186 pounds.  BMI 35.14 Pharynx is clear.  TMs are clear.  Neck supple.  Chest clear.  No cervical adenopathy.      Assessment & Plan:  Acute respiratory infection-?  Viral versus allergic rhinitis with secondary bacterial infection  Hyperlipidemia-did not tolerate Crestor 5 mg 3 times a week to recommend once a week.  Eczema versus insect bite right leg-apply triamcinolone cream 0.1% 3 times a day.  Plan: Refill Zithromax Z-PAK and gave her Depo-Medrol 80 mg IM.  Call if not better in 7 to 10 days or sooner if worse.  Take Crestor 5 mg once a week.

## 2019-09-13 ENCOUNTER — Encounter: Payer: Self-pay | Admitting: Internal Medicine

## 2019-10-07 ENCOUNTER — Other Ambulatory Visit: Payer: Self-pay | Admitting: Internal Medicine

## 2019-10-12 ENCOUNTER — Other Ambulatory Visit: Payer: Self-pay | Admitting: Internal Medicine

## 2019-10-30 DIAGNOSIS — G4733 Obstructive sleep apnea (adult) (pediatric): Secondary | ICD-10-CM | POA: Diagnosis not present

## 2019-11-02 ENCOUNTER — Encounter: Payer: Self-pay | Admitting: Genetic Counselor

## 2019-11-16 ENCOUNTER — Ambulatory Visit (INDEPENDENT_AMBULATORY_CARE_PROVIDER_SITE_OTHER): Payer: Medicare Other | Admitting: Internal Medicine

## 2019-11-16 ENCOUNTER — Encounter: Payer: Self-pay | Admitting: Internal Medicine

## 2019-11-16 ENCOUNTER — Other Ambulatory Visit: Payer: Self-pay

## 2019-11-16 VITALS — BP 120/80 | HR 77 | Temp 97.8°F | Ht 61.0 in | Wt 181.0 lb

## 2019-11-16 DIAGNOSIS — R600 Localized edema: Secondary | ICD-10-CM

## 2019-11-16 DIAGNOSIS — I10 Essential (primary) hypertension: Secondary | ICD-10-CM | POA: Diagnosis not present

## 2019-11-16 DIAGNOSIS — M459 Ankylosing spondylitis of unspecified sites in spine: Secondary | ICD-10-CM | POA: Diagnosis not present

## 2019-11-16 MED ORDER — METHYLPREDNISOLONE ACETATE 80 MG/ML IJ SUSP
80.0000 mg | Freq: Once | INTRAMUSCULAR | Status: AC
  Administered 2019-11-16: 80 mg via INTRAMUSCULAR

## 2019-11-19 ENCOUNTER — Other Ambulatory Visit: Payer: Self-pay | Admitting: Internal Medicine

## 2019-12-05 DIAGNOSIS — C50911 Malignant neoplasm of unspecified site of right female breast: Secondary | ICD-10-CM | POA: Diagnosis not present

## 2019-12-09 NOTE — Progress Notes (Signed)
   Subjective:    Patient ID: Jessee Avers, female    DOB: 05-Nov-1965, 54 y.o.   MRN: [id number]  HPI Pleasant 54 year old female in today with history of ankylosing spondylitis which we treat from time to time with Depo-Medrol IM and that seems to work fairly well for her.  She would like an injection today.  Is noticed some edema of her ankles and feet with recent hot weather.  She is on amlodipine and HCTZ.  Amlodipine could aggravate dependent edema particularly in hot weather.  Offered her Lasix but she declines.    Review of Systems no other complaints today     Objective:   Physical Exam Blood pressure 120/80 pulse 77 temperature 97.8 degrees pulse oximetry 97% weight 181 pounds BMI 34.20  Neck is supple without JVD thyromegaly or carotid bruits.  Chest is clear to auscultation without rales or wheezing.  Cardiac exam regular rate and rhythm normal S1 and S2 without murmurs or gallops.  There is trace to 1+ edema of her lower extremities.       Assessment & Plan:  Lower extremity edema aggravated by Norvasc and hot weather.  Watch salt intake.  Continue HCTZ  Ankylosing spondylitis treated from time to time with Depo-Medrol IM.  Given 80 mg IM today in office.

## 2019-12-09 NOTE — Patient Instructions (Signed)
It was a pleasure to see you today.  Depo-Medrol 80 mg IM given for ankylosing spondylitis.  Continue HCTZ and Norvasc for hypertension.  HCTZ helps with edema.

## 2019-12-12 ENCOUNTER — Ambulatory Visit (INDEPENDENT_AMBULATORY_CARE_PROVIDER_SITE_OTHER): Payer: Medicare Other | Admitting: Internal Medicine

## 2019-12-12 ENCOUNTER — Other Ambulatory Visit: Payer: Self-pay

## 2019-12-12 ENCOUNTER — Encounter: Payer: Self-pay | Admitting: Internal Medicine

## 2019-12-12 VITALS — BP 120/80 | HR 74 | Temp 97.7°F | Ht 61.0 in | Wt 181.0 lb

## 2019-12-12 DIAGNOSIS — R300 Dysuria: Secondary | ICD-10-CM | POA: Diagnosis not present

## 2019-12-12 DIAGNOSIS — R350 Frequency of micturition: Secondary | ICD-10-CM | POA: Diagnosis not present

## 2019-12-12 DIAGNOSIS — K5900 Constipation, unspecified: Secondary | ICD-10-CM

## 2019-12-12 DIAGNOSIS — M450 Ankylosing spondylitis of multiple sites in spine: Secondary | ICD-10-CM | POA: Diagnosis not present

## 2019-12-12 DIAGNOSIS — N342 Other urethritis: Secondary | ICD-10-CM | POA: Diagnosis not present

## 2019-12-12 DIAGNOSIS — L308 Other specified dermatitis: Secondary | ICD-10-CM

## 2019-12-12 LAB — POCT URINALYSIS DIPSTICK
Appearance: NEGATIVE
Bilirubin, UA: NEGATIVE
Blood, UA: NEGATIVE
Glucose, UA: NEGATIVE
Ketones, UA: NEGATIVE
Leukocytes, UA: NEGATIVE
Nitrite, UA: NEGATIVE
Odor: NEGATIVE
Protein, UA: NEGATIVE
Spec Grav, UA: 1.01 (ref 1.010–1.025)
Urobilinogen, UA: 0.2 E.U./dL
pH, UA: 7 (ref 5.0–8.0)

## 2019-12-12 MED ORDER — VITAMIN D (ERGOCALCIFEROL) 1.25 MG (50000 UNIT) PO CAPS: ORAL_CAPSULE | ORAL | 1 refills | Status: DC

## 2019-12-12 MED ORDER — TRIAMCINOLONE ACETONIDE 0.1 % EX CREA: 1.0000 "application " | TOPICAL_CREAM | Freq: Three times a day (TID) | CUTANEOUS | 0 refills | Status: DC

## 2019-12-12 MED ORDER — CIPROFLOXACIN HCL 500 MG PO TABS: 500.0000 mg | ORAL_TABLET | Freq: Two times a day (BID) | ORAL | 0 refills | Status: DC

## 2019-12-12 NOTE — Progress Notes (Signed)
   Subjective:    Patient ID: Sonya Franklin, female    DOB: 08-Sep-1965, 54 y.o.   MRN: [id number]  HPI 54 year old Female called today with complaint of urinary frequency and was concerned she might have a UTI.  She had some dental work done recently and was given amoxicillin for dental prophylaxis by dentist.  She took that prior to procedure.  Afterwards she developed urinary frequency.  No dysuria.  No fever chills or significant back pain.  Also developed some itching over her right upper shoulder area.  She has had some constipation. She is experiencing some right lower abdominal pain.  She has a history of ankylosing spondylitis and gets Depo-Medrol injections from time to time to control musculoskeletal pain.  She has a history of multinodular goiter and sleep apnea.  She has remote history of breast cancer.  In November 2020 she had similar episode of urinary frequency and pressure in the right lower quadrant.  She did some yard work and was thought to strengthen the abdominal muscles.  Urine dipstick was negative.  At the time she was thought to have urethritis and was treated with Cipro 500 mg twice a day for 3 days.  Today, urine dipstick is unremarkable.  She has no CVA tenderness.    Review of Systems     Objective:   Physical Exam Temperature 97.7 degrees orally blood pressure 120/80 pulse 74 weight 181 pounds height 5 feet 1 inches BMI 34.20 she has an area of dry skin posterior shoulder area near the glenohumeral joint.  No definite rash noted.  Can see some scratch marks where she has been scratching this area.  Dipstick UA is unremarkable for UTI or occult blood  She has some mild tenderness in her right lower quadrant and could well be constipated.       Assessment & Plan:  Urinary frequency-doubt urinary tract infection.  Possible urethritis.  Treat with Cipro 500 mg twice daily for 3 days  Constipation-has history of constipation  Nonspecific itchy rash right  posterior shoulder area.  Prescribed triamcinolone cream 0.1% to use 3 times daily as needed.  History of ankylosing spondylitis and has chronic musculoskeletal pain.

## 2019-12-12 NOTE — Patient Instructions (Signed)
Use triamcinolone cream on rash right posterior shoulder up 3 times daily.  Cipro 500 mg twice daily for 3 days.  May take MiraLAX for constipation.

## 2019-12-21 ENCOUNTER — Other Ambulatory Visit: Payer: Self-pay | Admitting: Internal Medicine

## 2019-12-31 ENCOUNTER — Other Ambulatory Visit: Payer: Self-pay | Admitting: Internal Medicine

## 2020-01-22 ENCOUNTER — Other Ambulatory Visit: Payer: Self-pay

## 2020-01-22 ENCOUNTER — Ambulatory Visit (INDEPENDENT_AMBULATORY_CARE_PROVIDER_SITE_OTHER): Payer: Medicare Other | Admitting: Internal Medicine

## 2020-01-22 ENCOUNTER — Encounter: Payer: Self-pay | Admitting: Internal Medicine

## 2020-01-22 VITALS — BP 110/60 | HR 92 | Temp 98.4°F | Ht 61.0 in | Wt 189.0 lb

## 2020-01-22 DIAGNOSIS — Z23 Encounter for immunization: Secondary | ICD-10-CM | POA: Diagnosis not present

## 2020-01-22 DIAGNOSIS — S91331A Puncture wound without foreign body, right foot, initial encounter: Secondary | ICD-10-CM | POA: Diagnosis not present

## 2020-01-22 MED ORDER — DOXYCYCLINE HYCLATE 100 MG PO TABS: 100.0000 mg | ORAL_TABLET | Freq: Two times a day (BID) | ORAL | 0 refills | Status: DC

## 2020-01-22 NOTE — Progress Notes (Signed)
   Subjective:    Patient ID: Sonya Franklin, female    DOB: [birth year], 54 y.o.   MRN: [id number]  HPI Patient was outdoors and accidentally stepped on a nail.  It went through her shoe and punctured her right foot plantar aspect.  She is not sure when she never had a tetanus immunization update.  1 was given today.  Foot is a bit sore.  She has tried soaking it.  Has not noticed any drainage from the puncture area.  History of ankylosing spondylitis for which she receives Depo-Medrol IM from time to time.  Last dose was in early September.  Had urinary frequency in late September but U/A was unremarkable.  Thought she possibly had urethritis and treated her with Cipro 500 mg twice daily for 3 days which had worked in the past.  Review of Systems no fever or shaking chills.  Checked her old paper chart and cannot find where there was a recent tetanus update either.  Consulted Up-to-date and I think she has given her booster would be sufficient.  The wound is clean.  Unlikely to get tetanus from this particular wound.     Objective:   Physical Exam Blood pressure 110/60 pulse 92 temperature 98.4 degrees pulse oximetry 98% weight 189 pounds BMI 35.78 pounds puncture wound plantar aspect right foot that is clean, not red, not hot to touch and not draining.  Very mildly tender.       Assessment & Plan:  Puncture wound right foot  Tetanus immunization update given.  She will keep area clean and dry.  Doxycycline 100 mg twice daily for 10 days.

## 2020-01-29 DIAGNOSIS — G4733 Obstructive sleep apnea (adult) (pediatric): Secondary | ICD-10-CM | POA: Diagnosis not present

## 2020-02-05 ENCOUNTER — Ambulatory Visit (INDEPENDENT_AMBULATORY_CARE_PROVIDER_SITE_OTHER): Payer: Medicare Other | Admitting: Internal Medicine

## 2020-02-05 ENCOUNTER — Other Ambulatory Visit: Payer: Self-pay

## 2020-02-05 ENCOUNTER — Encounter: Payer: Self-pay | Admitting: Internal Medicine

## 2020-02-05 VITALS — BP 120/80 | HR 82 | Temp 97.9°F | Ht 61.0 in | Wt 187.0 lb

## 2020-02-05 DIAGNOSIS — M450 Ankylosing spondylitis of multiple sites in spine: Secondary | ICD-10-CM

## 2020-02-05 MED ORDER — METHYLPREDNISOLONE ACETATE 80 MG/ML IJ SUSP
80.0000 mg | Freq: Once | INTRAMUSCULAR | Status: AC
  Administered 2020-02-05: 80 mg via INTRAMUSCULAR

## 2020-02-05 NOTE — Patient Instructions (Addendum)
Depo-Medrol 80 mg IM given today.  Puncture wound right sole of foot is healed.  Return as needed.

## 2020-02-05 NOTE — Progress Notes (Signed)
   Subjective:    Patient ID: Sonya Franklin, female    DOB: December 15, 1965, 54 y.o.   MRN: [id number]  HPI Patient stepped on a nail and sustained a puncture wound right sole of foot requiring office visit on November 8.  Tetanus immunization update given at that time.  She was treated with doxycycline 100 mg twice daily for 7 days since tetanus immunization was out of date.  The area was tender to touch but was not draining at the time.  She has since recovered and has no tenderness at puncture site.  Here today to follow-up on puncture wound and also to get Depo-Medrol injection which helps with her symptoms of ankylosing spondylitis from time to time.  She is not on chronic medication for this.    Review of Systems-no new symptoms.  Has chronic musculoskeletal pain.  Did not develop fever or chills with puncture wound.     Objective:   Physical Exam  Blood pressure 120/80 pulse 82 regular temperature 97.9 degrees pulse oximetry 97% weight 187 pounds height 5 feet 1 inches BMI 35.33  Puncture site right sole of foot is clean with no evidence of infection.      Assessment & Plan:  Healed puncture wound right sole of foot  History of chronic musculoskeletal pain which has been described by rheumatologist as possible ankylosing spondylitis  Plan: Depo-Medrol 80 mg IM which seems to help her immensely for a period of a few weeks.  Return as needed.

## 2020-02-11 NOTE — Patient Instructions (Signed)
Soak foot in warm soapy water for 20 minutes twice daily.  Take doxycycline 100 mg twice daily for 10 days.  Tetanus immunization update given.

## 2020-03-11 ENCOUNTER — Telehealth: Payer: Self-pay | Admitting: Internal Medicine

## 2020-03-11 NOTE — Telephone Encounter (Signed)
This is not urgent see later this week. Tell her therei s a lot of Covid out here. Exactly how bad is it?

## 2020-03-11 NOTE — Telephone Encounter (Signed)
Patient is going to watch it for another week or so and see if COVID calms down. She will call back if it does not get better.

## 2020-03-11 NOTE — Telephone Encounter (Signed)
SHAHIDAH NESBITT [phone]  Rubina called to say her R foot started swelling last week.

## 2020-04-05 ENCOUNTER — Other Ambulatory Visit: Payer: Medicare Other | Admitting: Internal Medicine

## 2020-04-05 ENCOUNTER — Other Ambulatory Visit: Payer: Self-pay

## 2020-04-05 DIAGNOSIS — M459 Ankylosing spondylitis of unspecified sites in spine: Secondary | ICD-10-CM | POA: Diagnosis not present

## 2020-04-05 DIAGNOSIS — R0989 Other specified symptoms and signs involving the circulatory and respiratory systems: Secondary | ICD-10-CM | POA: Diagnosis not present

## 2020-04-05 DIAGNOSIS — R600 Localized edema: Secondary | ICD-10-CM | POA: Diagnosis not present

## 2020-04-05 DIAGNOSIS — E7800 Pure hypercholesterolemia, unspecified: Secondary | ICD-10-CM | POA: Diagnosis not present

## 2020-04-05 DIAGNOSIS — R7302 Impaired glucose tolerance (oral): Secondary | ICD-10-CM | POA: Diagnosis not present

## 2020-04-06 LAB — CBC WITH DIFFERENTIAL/PLATELET
Absolute Monocytes: 421 cells/uL (ref 200–950)
Basophils Absolute: 31 cells/uL (ref 0–200)
Basophils Relative: 0.6 %
Eosinophils Absolute: 130 cells/uL (ref 15–500)
Eosinophils Relative: 2.5 %
HCT: 35.6 % (ref 35.0–45.0)
Hemoglobin: 11.8 g/dL (ref 11.7–15.5)
Lymphs Abs: 1529 cells/uL (ref 850–3900)
MCH: 28.6 pg (ref 27.0–33.0)
MCHC: 33.1 g/dL (ref 32.0–36.0)
MCV: 86.4 fL (ref 80.0–100.0)
MPV: 9.2 fL (ref 7.5–12.5)
Monocytes Relative: 8.1 %
Neutro Abs: 3089 cells/uL (ref 1500–7800)
Neutrophils Relative %: 59.4 %
Platelets: 273 10*3/uL (ref 140–400)
RBC: 4.12 10*6/uL (ref 3.80–5.10)
RDW: 13.5 % (ref 11.0–15.0)
Total Lymphocyte: 29.4 %
WBC: 5.2 10*3/uL (ref 3.8–10.8)

## 2020-04-06 LAB — HEMOGLOBIN A1C
Hgb A1c MFr Bld: 6 % of total Hgb — ABNORMAL HIGH (ref <5.7)
Mean Plasma Glucose: 126 mg/dL
eAG (mmol/L): 7 mmol/L

## 2020-04-06 LAB — COMPLETE METABOLIC PANEL WITH GFR
AG Ratio: 1.5 (calc) (ref 1.0–2.5)
ALT: 15 U/L (ref 6–29)
AST: 11 U/L (ref 10–35)
Albumin: 4.4 g/dL (ref 3.6–5.1)
Alkaline phosphatase (APISO): 89 U/L (ref 37–153)
BUN: 11 mg/dL (ref 7–25)
CO2: 27 mmol/L (ref 20–32)
Calcium: 9.4 mg/dL (ref 8.6–10.4)
Chloride: 104 mmol/L (ref 98–110)
Creat: 0.52 mg/dL (ref 0.50–1.05)
GFR, Est African American: 126 mL/min/{1.73_m2} (ref >=60)
GFR, Est Non African American: 108 mL/min/{1.73_m2} (ref >=60)
Globulin: 3 g/dL (calc) (ref 1.9–3.7)
Glucose, Bld: 88 mg/dL (ref 65–99)
Potassium: 4 mmol/L (ref 3.5–5.3)
Sodium: 141 mmol/L (ref 135–146)
Total Bilirubin: 0.5 mg/dL (ref 0.2–1.2)
Total Protein: 7.4 g/dL (ref 6.1–8.1)

## 2020-04-06 LAB — LIPID PANEL
Cholesterol: 252 mg/dL — ABNORMAL HIGH (ref <200)
HDL: 95 mg/dL (ref >=50)
LDL Cholesterol (Calc): 140 mg/dL (calc) — ABNORMAL HIGH
Non-HDL Cholesterol (Calc): 157 mg/dL (calc) — ABNORMAL HIGH (ref <130)
Total CHOL/HDL Ratio: 2.7 (calc) (ref <5.0)
Triglycerides: 78 mg/dL (ref <150)

## 2020-04-06 LAB — TSH: TSH: 0.57 mIU/L

## 2020-04-08 ENCOUNTER — Other Ambulatory Visit: Payer: Self-pay

## 2020-04-08 ENCOUNTER — Encounter: Payer: Self-pay | Admitting: Internal Medicine

## 2020-04-08 ENCOUNTER — Ambulatory Visit (INDEPENDENT_AMBULATORY_CARE_PROVIDER_SITE_OTHER): Payer: Medicare Other | Admitting: Internal Medicine

## 2020-04-08 ENCOUNTER — Other Ambulatory Visit (HOSPITAL_COMMUNITY): Payer: Self-pay | Admitting: Internal Medicine

## 2020-04-08 VITALS — BP 130/70 | HR 72 | Ht 61.0 in | Wt 196.0 lb

## 2020-04-08 DIAGNOSIS — Z0000 Encounter for general adult medical examination without abnormal findings: Secondary | ICD-10-CM | POA: Diagnosis not present

## 2020-04-08 DIAGNOSIS — I10 Essential (primary) hypertension: Secondary | ICD-10-CM

## 2020-04-08 DIAGNOSIS — R7302 Impaired glucose tolerance (oral): Secondary | ICD-10-CM | POA: Diagnosis not present

## 2020-04-08 DIAGNOSIS — M459 Ankylosing spondylitis of unspecified sites in spine: Secondary | ICD-10-CM

## 2020-04-08 DIAGNOSIS — Z853 Personal history of malignant neoplasm of breast: Secondary | ICD-10-CM

## 2020-04-08 DIAGNOSIS — R600 Localized edema: Secondary | ICD-10-CM | POA: Diagnosis not present

## 2020-04-08 DIAGNOSIS — E7800 Pure hypercholesterolemia, unspecified: Secondary | ICD-10-CM | POA: Diagnosis not present

## 2020-04-08 DIAGNOSIS — J309 Allergic rhinitis, unspecified: Secondary | ICD-10-CM

## 2020-04-08 DIAGNOSIS — R0989 Other specified symptoms and signs involving the circulatory and respiratory systems: Secondary | ICD-10-CM

## 2020-04-08 DIAGNOSIS — F411 Generalized anxiety disorder: Secondary | ICD-10-CM

## 2020-04-08 LAB — POCT URINALYSIS DIPSTICK
Appearance: NEGATIVE
Bilirubin, UA: NEGATIVE
Blood, UA: NEGATIVE
Glucose, UA: NEGATIVE
Ketones, UA: NEGATIVE
Leukocytes, UA: NEGATIVE
Nitrite, UA: NEGATIVE
Odor: NEGATIVE
Protein, UA: POSITIVE — AB
Spec Grav, UA: 1.01 (ref 1.010–1.025)
Urobilinogen, UA: 0.2 E.U./dL
pH, UA: 6 (ref 5.0–8.0)

## 2020-04-08 MED ORDER — METHYLPREDNISOLONE ACETATE 80 MG/ML IJ SUSP
80.0000 mg | Freq: Once | INTRAMUSCULAR | Status: AC
  Administered 2020-04-08: 80 mg via INTRAMUSCULAR

## 2020-04-08 MED ORDER — AMLODIPINE BESYLATE 5 MG PO TABS: 5.0000 mg | ORAL_TABLET | Freq: Every day | ORAL | 2 refills | Status: DC

## 2020-04-08 MED ORDER — ALPRAZOLAM 0.25 MG PO TABS: 0.2500 mg | ORAL_TABLET | Freq: Two times a day (BID) | ORAL | 1 refills | Status: DC | PRN

## 2020-04-08 NOTE — Patient Instructions (Addendum)
It was a pleasure to see you today. Depomedrol given IM for ankylosing spondylitis.  There is a new left carotid bruit.  Carotid duplex studies arranged for tomorrow.  Cholesterol is elevated but due to ankylosing spondylitis, patient has not wanted to be on statin medication.

## 2020-04-08 NOTE — Progress Notes (Signed)
Subjective:    Patient ID: Sonya Franklin, female    DOB: 09/05/1965, 55 y.o.   MRN: [id number]  HPI 55 year old Female seen for health maintenance exam and evaluation of medical issues.  History of ankylosing spondylitis treated with Depo-Medrol IM from time to time.  History of multinodular goiter and has seen Dr. Dwyane Dee for management but not recently.  Had biopsy in 2006 of complex thyroid nodule right inferior lobe which showed benign colloid nodule.  She has mild hypertension treated with amlodipine.  History of GE reflux treated with Protonix.  History of vitamin D deficiency.  Takes ibuprofen for musculoskeletal pain and Xanax for anxiety.  History abdominal pain 2013 and CT showed inflammatory process with negative stool studies for C. difficile.  Has had recurrent episodes of abdominal pain that responded to steroids.  Colonoscopy by Dr. Fuller Plan in 2014 was normal.  Repeat study recommended in 2024.  History of right breast cancer in 2007.  She had adjuvant chemotherapy.  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  Has done well since that time but was not able to return to work after treatment.  She underwent a right lumpectomy and sentinel node biopsy in May 2007 for T2N0 grade 2 invasive ductal carcinoma.  Tumor was strongly ER/PR positive HER-2/neu negative.  She had 4 cycles of doxorubicin and cyclophosphamide followed by weekly Taxol x4.  She is status post bilateral mastectomies November 2007 with implant reconstruction and no evidence of residual disease.  She took Femara briefly in 2012 and decided to discontinue that.  Prior to that she took tamoxifen from January 2008 until June 2011.  History of mild to moderate osteopenia on bone density study in 2012.  T score was -1.5 in the LS spine and -0.7 in the left femoral neck.  Social history: She receives disability benefits for history of breast cancer 2007 and ankylosing spondylitis.  She resides with  special needs son.  She is divorced.  Non-smoker.  No alcohol consumption.  Family history: Father with history of prostate cancer.  Patient's mother was diagnosed with breast cancer at age 1.  Maternal aunt with history of breast cancer.  3 brothers.    Review of Systems getting along fairly well with the pandemic.  Basically stays at home and enjoys it.  Main complaint is musculoskeletal pain and would like to have Depo-Medrol injection today which was given.     Objective:   Physical Exam Blood pressure 130/70 pulse 72 pulse oximetry 99% weight 196 pounds BMI 37.03  Skin is warm and dry.  No cervical adenopathy.  No thyromegaly.  She has a left carotid bruit which is new.  Chest is clear to auscultation.  Cardiac exam regular rate and rhythm normal S1 and S2.  Breasts: Bilateral mastectomies with implant reconstruction.  Abdomen: Soft nondistended without hepatosplenomegaly masses or tenderness.  Extremities without deformity or edema.  Neuro: No focal deficits on brief neurological exam.  Affect, thought, and judgment are normal.       Assessment & Plan:  New left carotid bruit-to have carotid duplex scan tomorrow for evaluation.  Has been asymptomatic.  Ankylosing spondylitis treated with Depo-Medrol IM from time to time and was given Depo-Medrol IM injection today because she is having considerable musculoskeletal pain.  History of right lumpectomy 2007  Status post bilateral mastectomies November 2007 with implant reconstruction  History of osteopenia  Hyperlipidemia treated with Crestor 5 mg 3 times a week  but I do not think she is taking this at the present time based on her levels of total cholesterol of 252 and LDL cholesterol of 140.   GE reflux treated with Protonix  Mild essential hypertension treated with amlodipine and stable.  Occasionally takes HCTZ 25 mg daily for lower extremity edema.  History of vitamin D deficiency-this level was not done due to expense.   Has been maintained on high-dose vitamin D weekly.  In June 2019 her vitamin D level was 20 and 4 number of years previously had been abnormal in the 13-20 range.  I think it is best for her to continue with high-dose vitamin D weekly.  History of anxiety treated with twice daily Xanax as needed  Impaired glucose tolerance-treated with diet alone.  Hemoglobin A1c is 6%.  Generally is in the 5.7-5.8 range.  She controls this with diet and suggest we recheck this in 6 months.  Plan: Continue current medications.  Has carotid duplex study tomorrow with further recommendations to follow.  Continue current medications.  Watch diet a bit his hemoglobin A1c is up slightly.  Has had 2 COVID-19 immunizations.  Needs booster.  Tetanus immunization is up-to-date.  Declines flu vaccine.  Generally seen every 6 months and in between if needs Depo-Medrol for ankylosing spondylitis.  Subjective:   Patient presents for Medicare Annual/Subsequent preventive examination.  Review Past Medical/Family/Social: See above   Risk Factors  Current exercise habits: Active about her property Dietary issues discussed: Low-fat low carbohydrate  Cardiac risk factors: Hyperlipidemia  Depression Screen  (Note: if answer to either of the following is "Yes", a more complete depression screening is indicated)   Over the past two weeks, have you felt down, depressed or hopeless? No  Over the past two weeks, have you felt little interest or pleasure in doing things? No Have you lost interest or pleasure in daily life? No Do you often feel hopeless? No Do you cry easily over simple problems? No   Activities of Daily Living  In your present state of health, do you have any difficulty performing the following activities?:   Driving?  Does not like driving in [place] because of traffic Managing money? No  Feeding yourself? No  Getting from bed to chair?  Yes when having musculoskeletal pain Climbing a flight of stairs?   Yes when having musculoskeletal pain Preparing food and eating?: No  Bathing or showering? No  Getting dressed: No  Getting to the toilet? No  Using the toilet:No  Moving around from place to place: Yes at times due to pain In the past year have you fallen or had a near fall?:No  Are you sexually active? No  Do you have more than one partner? No   Hearing Difficulties:  Do you often ask people to speak up or repeat themselves?  Sometimes Do you experience ringing or noises in your ears?  Yes Do you have difficulty understanding soft or whispered voices?  Yes sometimes Do you feel that you have a problem with memory?  Sometimes Do you often misplace items?  Sometimes  Home Safety:  Do you have a smoke alarm at your residence? Yes Do you have grab bars in the bathroom?  No Do you have throw rugs in your house?  Yes   Cognitive Testing  Alert? Yes Normal Appearance?Yes  Oriented to person? Yes Place? Yes  Time? Yes  Recall of three objects? Yes  Can perform simple calculations? Yes  Displays appropriate judgment?Yes  Can read the correct time from a watch face?Yes   List the Names of Other Physician/Practitioners you currently use:  See referral list for the physicians patient is currently seeing.     Review of Systems: See above   Objective:     General appearance: Appears stated age  Head: Normocephalic, without obvious abnormality, atraumatic  Eyes: conj clear, EOMi PEERLA  Ears: normal TM's and external ear canals both ears  Nose: Nares normal. Septum midline. Mucosa normal. No drainage or sinus tenderness.  Throat: lips, mucosa, and tongue normal; teeth and gums normal  Neck: no adenopathy, no carotid bruit, no JVD, supple, symmetrical, trachea midline and thyroid not enlarged, symmetric, no tenderness/mass/nodules  No CVA tenderness.  Lungs: clear to auscultation bilaterally  Breasts: normal appearance, no masses or tenderness Heart: regular rate and rhythm,  S1, S2 normal, no murmur, click, rub or gallop  Abdomen: soft, non-tender; bowel sounds normal; no masses, no organomegaly  Musculoskeletal: ROM normal in all joints, no crepitus, no deformity, Normal muscle strengthen. Back  is symmetric, no curvature. Skin: Skin color, texture, turgor normal. No rashes or lesions  Lymph nodes: Cervical, supraclavicular, and axillary nodes normal.  Neurologic: CN 2 -12 Normal, Normal symmetric reflexes. Normal coordination and gait  Psych: Alert & Oriented x 3, Mood appear stable.    Assessment:    Annual wellness medicare exam   Plan:    During the course of the visit the patient was educated and counseled about appropriate screening and preventive services including:   Colonoscopy done in 2014  Make sure patient has had 3 Covid immunizations  Status post bilateral mastectomy so does not get mammograms     Patient Instructions (the written plan) was given to the patient.  Medicare Attestation  I have personally reviewed:  The patient's medical and social history  Their use of alcohol, tobacco or illicit drugs  Their current medications and supplements  The patient's functional ability including ADLs,fall risks, home safety risks, cognitive, and hearing and visual impairment  Diet and physical activities  Evidence for depression or mood disorders  The patient's weight, height, BMI, and visual acuity have been recorded in the chart. I have made referrals, counseling, and provided education to the patient based on review of the above and I have provided the patient with a written personalized care plan for preventive services.

## 2020-04-09 ENCOUNTER — Ambulatory Visit (HOSPITAL_COMMUNITY)
Admission: RE | Admit: 2020-04-09 | Discharge: 2020-04-09 | Disposition: A | Discharge status: Home / Self Care | Payer: Medicare Other | Source: Ambulatory Visit | Attending: Internal Medicine | Admitting: Internal Medicine

## 2020-04-09 DIAGNOSIS — R0989 Other specified symptoms and signs involving the circulatory and respiratory systems: Secondary | ICD-10-CM | POA: Insufficient documentation

## 2020-04-09 LAB — MICROALBUMIN / CREATININE URINE RATIO
Creatinine, Urine: 170 mg/dL (ref 20–275)
Microalb Creat Ratio: 23 mcg/mg creat (ref <30)
Microalb, Ur: 3.9 mg/dL

## 2020-04-11 ENCOUNTER — Encounter: Payer: Self-pay | Admitting: Vascular Surgery

## 2020-04-11 ENCOUNTER — Ambulatory Visit: Payer: Medicare Other | Admitting: Vascular Surgery

## 2020-04-11 ENCOUNTER — Telehealth: Payer: Self-pay | Admitting: Internal Medicine

## 2020-04-11 ENCOUNTER — Other Ambulatory Visit: Payer: Self-pay

## 2020-04-11 VITALS — BP 124/81 | HR 71 | Temp 98.2°F | Ht 61.0 in | Wt 190.6 lb

## 2020-04-11 DIAGNOSIS — R011 Cardiac murmur, unspecified: Secondary | ICD-10-CM | POA: Diagnosis not present

## 2020-04-11 NOTE — Progress Notes (Signed)
Referring Physician: Dr. Renold Genta  Patient name: Sonya Franklin MRN: [id number] DOB: Oct 20, 1965 Sex: female  REASON FOR CONSULT: Left carotid bruit  HPI: Sonya Franklin is a 55 y.o. female, who recently has had some intermittent numbness in her left hand and left leg.  She does have a history of ankylosing spondylitis.  However, she was found to have a left carotid bruit on recent exam and sent for carotid duplex scan.  Patient also has a history of mitral valve prolapse.  She has not had an echocardiogram from my review of the records for at least 10 years.  She does not describe chest pain or shortness of breath.  She states she does have some pain in her chest but feels like this is more indigestion.  This does not occur all the time but is not rare.  She has noticed some increased swelling in both of her feet recently.  This was not really acute in onset.  This has been going on for about 3 weeks.  Other medical problems include history of right breast cancer, reflux, hypertension all of which have been stable.  She is on a statin.  She does not take aspirin regularly but when she has had these events of numbness and tingling she has taken an aspirin then.  She does also complain of some fullness and occasional ringing in her left ear.  She does not really describe pulsatile tinnitus.  Past Medical History:  Diagnosis Date  . Ankylosing spondylitis ([place])   . Anxiety   . Arthritis   . Breast cancer ([place])    right breast  . Bursitis of hip, right   . Cardiac arrhythmia   . GE reflux   . Hiatal hernia   . Hypertension   . Hyperthyroidism   . Ischemic bowel disease ([place])   . Snoring 03/21/2014  . Vitamin D deficiency    Past Surgical History:  Procedure Laterality Date  . ABDOMINAL HYSTERECTOMY    . BREAST LUMPECTOMY     right  . MASTECTOMY     double    Family History  Problem Relation Age of Onset  . Breast cancer Maternal Aunt   . Colitis Brother   . Diabetes Mother   .  Hypertension Mother   . Hyperlipidemia Mother   . Goiter Maternal Grandmother     SOCIAL HISTORY: Social History   Socioeconomic History  . Marital status: Legally Separated    Spouse name: Not on file  . Number of children: 1  . Years of education: Not on file  . Highest education level: Not on file  Occupational History    Employer: UNEMPLOYED  Tobacco Use  . Smoking status: Never Smoker  . Smokeless tobacco: Never Used  Substance and Sexual Activity  . Alcohol use: No  . Drug use: No  . Sexual activity: Yes  Other Topics Concern  . Not on file  Social History Narrative   Caffeine occasional drinker.   Not working/disabled.  12th grader. Divorced, one kids.    Social Determinants of Health   Financial Resource Strain: Not on file  Food Insecurity: Not on file  Transportation Needs: Not on file  Physical Activity: Not on file  Stress: Not on file  Social Connections: Not on file  Intimate Partner Violence: Not on file    Allergies  Allergen Reactions  . Shellfish Allergy Anaphylaxis  . Blue Dyes (Parenteral)     Current Outpatient Medications  Medication Sig  Dispense Refill  . ALPRAZolam (XANAX) 0.25 MG tablet Take 1 tablet (0.25 mg total) by mouth 2 (two) times daily as needed for anxiety. 60 tablet 1  . amLODipine (NORVASC) 5 MG tablet Take 1 tablet (5 mg total) by mouth daily. 90 tablet 2  . hydrochlorothiazide (HYDRODIURIL) 25 MG tablet TAKE 1 TABLET (25 MG TOTAL) BY MOUTH DAILY. AS NEEDED FOR FLUID RETENTION 90 tablet 0  . ibuprofen (ADVIL) 800 MG tablet TAKE 1 TABLET (800 MG TOTAL) BY MOUTH EVERY 8 (EIGHT) HOURS AS NEEDED FOR PAIN. 180 tablet 1  . olopatadine (PATANOL) 0.1 % ophthalmic solution Place 1 drop into both eyes 2 (two) times daily. (Patient taking differently: Place 1 drop into both eyes 2 (two) times daily as needed.) 5 mL 12  . pantoprazole (PROTONIX) 40 MG tablet TAKE 1 TABLET BY MOUTH EVERY DAY 90 tablet 3  . rosuvastatin (CRESTOR) 5 MG  tablet TAKE ONE TABLET BY MOUTH THREE TIMES A WEEK 36 tablet 2  . saccharomyces boulardii (FLORASTOR) 250 MG capsule Take 1 capsule (250 mg total) by mouth 2 (two) times daily. (Patient taking differently: Take 250 mg by mouth as needed.) 40 capsule 0  . triamcinolone cream (KENALOG) 0.1 % Apply 1 application topically 3 (three) times daily. 30 g 0  . Vitamin D, Ergocalciferol, (DRISDOL) 1.25 MG (50000 UNIT) CAPS capsule Take one capsule by mouth once a week 12 capsule 1   Current Facility-Administered Medications  Medication Dose Route Frequency Provider Last Rate Last Admin  . methylPREDNISolone acetate (DEPO-MEDROL) injection 80 mg  80 mg Intramuscular Once Elby Showers, MD        ROS:   General:  No weight loss, Fever, chills  HEENT: No recent headaches, no nasal bleeding, no visual changes, no sore throat  Neurologic: No dizziness, blackouts, seizures. No recent symptoms of stroke or mini- stroke. No recent episodes of slurred speech, or temporary blindness.  Cardiac: No recent episodes of chest pain/pressure, no shortness of breath at rest.  No shortness of breath with exertion.  Denies history of atrial fibrillation or irregular heartbeat  Vascular: No history of rest pain in feet.  No history of claudication.  No history of non-healing ulcer, No history of DVT   Pulmonary: No home oxygen, no productive cough, no hemoptysis,  No asthma or wheezing  Musculoskeletal:  [X]  Arthritis, [ ]  Low back pain,  [X]  Joint pain  Hematologic:No history of hypercoagulable state.  No history of easy bleeding.  No history of anemia  Gastrointestinal: No hematochezia or melena,  No gastroesophageal reflux, no trouble swallowing  Urinary: [ ]  chronic Kidney disease, [ ]  on HD - [ ]  MWF or [ ]  TTHS, [ ]  Burning with urination, [ ]  Frequent urination, [ ]  Difficulty urinating;   Skin: No rashes  Psychological: No history of anxiety,  No history of depression   Physical Examination  Vitals:    04/11/20 1349  BP: 124/81  Pulse: 71  Temp: 98.2 F (36.8 C)  TempSrc: Skin  SpO2: 99%  Weight: 190 lb 9.6 oz (86.5 kg)  Height: 5\' 1"  (1.549 m)    Body mass index is 36.01 kg/m.  General:  Alert and oriented, no acute distress HEENT: Normal Neck: No JVD, left side carotid bruit Pulmonary: Clear to auscultation bilaterally Cardiac: Regular Rate and Rhythm 3/6 systolic murmur heard throughout the precordium most intense to the right of the sternum Abdomen: Soft, non-tender, non-distended Skin: No rash Extremity Pulses:  2+ radial, brachial,  dorsalis pedis, posterior tibial pulses bilaterally Musculoskeletal: No deformity trace ankle and pedal edema  Neurologic: Upper and lower extremity motor 5/5 and symmetric  DATA:  Patient had a carotid duplex exam 2 days ago which I reviewed and interpreted.  This showed no significant carotid stenosis bilaterally.  ASSESSMENT: Left carotid bruit most likely transmitted from her cardiac murmur.  Patient does have a history of mitral valve prolapse in the past but has not had an echocardiogram in several years.  She does have some lower extremity edema as well.  No significant carotid occlusive disease.   PLAN: We will schedule her for a transthoracic echocardiogram for further evaluation of valvular disease.  She will follow up with me in 2 weeks.  Ruta Hinds, MD [facility] Office: [phone]

## 2020-04-11 NOTE — Telephone Encounter (Signed)
LVM to CB to see if she would like to start taking Crestor or Zetia

## 2020-04-16 ENCOUNTER — Encounter: Payer: Medicare Other | Admitting: Vascular Surgery

## 2020-04-19 ENCOUNTER — Other Ambulatory Visit: Payer: Self-pay

## 2020-04-19 ENCOUNTER — Ambulatory Visit (HOSPITAL_COMMUNITY)
Admission: RE | Admit: 2020-04-19 | Discharge: 2020-04-19 | Disposition: A | Discharge status: Home / Self Care | Payer: Medicare Other | Source: Ambulatory Visit | Attending: Vascular Surgery | Admitting: Vascular Surgery

## 2020-04-19 DIAGNOSIS — I10 Essential (primary) hypertension: Secondary | ICD-10-CM | POA: Insufficient documentation

## 2020-04-19 DIAGNOSIS — I088 Other rheumatic multiple valve diseases: Secondary | ICD-10-CM | POA: Diagnosis not present

## 2020-04-19 DIAGNOSIS — R011 Cardiac murmur, unspecified: Secondary | ICD-10-CM | POA: Insufficient documentation

## 2020-04-19 NOTE — Progress Notes (Signed)
  Echocardiogram 2D Echocardiogram has been performed.  Sonya Franklin 04/19/2020, 4:01 PM

## 2020-04-25 ENCOUNTER — Encounter: Payer: Self-pay | Admitting: Vascular Surgery

## 2020-04-25 ENCOUNTER — Ambulatory Visit: Payer: Medicare Other | Admitting: Vascular Surgery

## 2020-04-25 ENCOUNTER — Other Ambulatory Visit: Payer: Self-pay

## 2020-04-25 VITALS — BP 130/82 | HR 77 | Temp 98.0°F | Resp 20 | Ht 61.0 in | Wt 190.0 lb

## 2020-04-25 DIAGNOSIS — R0989 Other specified symptoms and signs involving the circulatory and respiratory systems: Secondary | ICD-10-CM | POA: Diagnosis not present

## 2020-04-25 LAB — ECHOCARDIOGRAM COMPLETE
Area-P 1/2: 3.03 cm2
S' Lateral: 3.1 cm

## 2020-04-25 NOTE — Progress Notes (Signed)
Patient returns for follow-up today.  She reports mild lower extremity edema but otherwise is doing well.  She has no shortness of breath or chest pain.  She has had no symptoms of TIA amaurosis or stroke.  Initial referral was for a left carotid bruit.  Physical exam:  Vitals:   04/25/20 1515  BP: 130/82  Pulse: 77  Resp: 20  Temp: 98 F (36.7 C)  SpO2: 99%  Weight: 190 lb (86.2 kg)  Height: 5\' 1"  (1.549 m)    Neuro: Symmetric upper extremity lower extremity motor strength 5/5 no facial asymmetry  Data: I reviewed the patient's recent echocardiogram which showed mild mitral valve regurgitation, trivial tricuspid regurgitation, mild pulmonic valve regurgitation and normal valve structure throughout and no evidence of aortic stenosis.  Assessment: Left carotid bruit most likely secondary to transmitted heart sounds as her carotid duplex showed no significant stenosis and she did have mild regurgitation on her echocardiogram which may be a transmitted murmur.  Lower extremity edema mild in nature  Plan: Patient was given a recommendation for lower extremity compression stockings for controlling her lower extremity edema.  I do not believe she needs a repeat echocardiogram unless she develops new symptoms.  I do not believe she needs a repeat carotid duplex exam unless she develops symptoms of TIA amaurosis or stroke.  Patient currently is on low-dose aspirin.  Although she does not have a strong indicator for this I told her overall the risk of this would be fairly low with no GI history.  She will continue this for now.  Patient will follow up with me on an as-needed basis.  Ruta Hinds, MD [facility] Office: [phone]

## 2020-05-13 ENCOUNTER — Other Ambulatory Visit: Payer: Self-pay | Admitting: Internal Medicine

## 2020-05-31 ENCOUNTER — Telehealth: Payer: Self-pay | Admitting: Internal Medicine

## 2020-05-31 NOTE — Telephone Encounter (Signed)
We got notes from GYN.  She said she feels pressure and nagging pain on her lower abdomen and hips, pain comes and goes. She is having a bowel movement everyday. She said she does not have a uterus and GYN didn't know where this pain is coming from. No fever.

## 2020-05-31 NOTE — Telephone Encounter (Signed)
LVM to CB and schedule an appointment for week of 06/17/2020

## 2020-05-31 NOTE — Telephone Encounter (Signed)
We can see her week of April 4

## 2020-05-31 NOTE — Telephone Encounter (Signed)
[name] [phone]  Sonya Franklin called to say she feels heavy in the front especially when standing, no burning, or urgency, this has been going on for awhile. She went to GYN, in the last week or so, he was going to send you a message. She stated something is just not right and wants to come in and see you.

## 2020-05-31 NOTE — Telephone Encounter (Signed)
I have not heard from GYN. I need more information- what she told you is too vague before I can offer an appointmnent.

## 2020-06-03 NOTE — Telephone Encounter (Addendum)
LVM to CB.

## 2020-07-08 ENCOUNTER — Other Ambulatory Visit: Payer: Self-pay

## 2020-07-08 ENCOUNTER — Ambulatory Visit (INDEPENDENT_AMBULATORY_CARE_PROVIDER_SITE_OTHER): Payer: Medicare Other

## 2020-07-08 ENCOUNTER — Ambulatory Visit: Payer: Medicare Other | Admitting: Podiatry

## 2020-07-08 DIAGNOSIS — M722 Plantar fascial fibromatosis: Secondary | ICD-10-CM

## 2020-07-08 MED ORDER — BETAMETHASONE SOD PHOS & ACET 6 (3-3) MG/ML IJ SUSP
3.0000 mg | Freq: Once | INTRAMUSCULAR | Status: AC
  Administered 2020-07-08: 3 mg via INTRA_ARTICULAR

## 2020-07-08 NOTE — Progress Notes (Signed)
   Subjective: 55 y.o. female presenting as a new patient for evaluation of heel pain is been going on for the past month.  Sudden onset.  She denies a history of injury.  She states that that the left foot is worse than the right foot.  She takes Motrin 800 mg 3 times daily as prescribed for other ailments.  She presents for further treatment and evaluation   Past Medical History:  Diagnosis Date  . Ankylosing spondylitis ([place])   . Anxiety   . Arthritis   . Breast cancer ([place])    right breast  . Bursitis of hip, right   . Cardiac arrhythmia   . GE reflux   . Hiatal hernia   . Hypertension   . Hyperthyroidism   . Ischemic bowel disease ([place])   . Snoring 03/21/2014  . Vitamin D deficiency      Objective: Physical Exam General: The patient is alert and oriented x3 in no acute distress.  Dermatology: Skin is warm, dry and supple bilateral lower extremities. Negative for open lesions or macerations bilateral.   Vascular: Dorsalis Pedis and Posterior Tibial pulses palpable bilateral.  Capillary fill time is immediate to all digits.  Neurological: Epicritic and protective threshold intact bilateral.   Musculoskeletal: Tenderness to palpation to the plantar aspect of the bilateral heels along the plantar fascia. All other joints range of motion within normal limits bilateral. Strength 5/5 in all groups bilateral.   Radiographic exam: Normal osseous mineralization. Joint spaces preserved. No fracture/dislocation/boney destruction. No other soft tissue abnormalities or radiopaque foreign bodies.   Assessment: 1. plantar fasciitis bilateral feet  Plan of Care:  1. Patient evaluated. Xrays reviewed.   2. Injection of 0.5cc Celestone soluspan injected into the bilateral heels.  3.  Continue Motrin 800 mg 3 times daily as prescribed 4.  Cam boot dispensed.  Wear daily as needed  5.  Also a night splint was dispensed to wear nightly  6. Instructed patient regarding therapies and  modalities at home to alleviate symptoms.  7. Return to clinic in 4 weeks.    *On disability  Edrick Kins, DPM [facility]  Dr. Edrick Kins, DPM    [address], [place] [address]                Office [phone]  Fax [phone]

## 2020-07-09 DIAGNOSIS — G4733 Obstructive sleep apnea (adult) (pediatric): Secondary | ICD-10-CM | POA: Diagnosis not present

## 2020-07-18 ENCOUNTER — Telehealth: Payer: Self-pay | Admitting: Internal Medicine

## 2020-07-18 NOTE — Telephone Encounter (Signed)
Faxed signed order to [facility], [place], phone [phone] , for Mastectomy Bra

## 2020-07-25 DIAGNOSIS — C50911 Malignant neoplasm of unspecified site of right female breast: Secondary | ICD-10-CM | POA: Diagnosis not present

## 2020-08-13 ENCOUNTER — Encounter: Payer: Self-pay | Admitting: Internal Medicine

## 2020-08-13 ENCOUNTER — Telehealth: Payer: Self-pay | Admitting: Internal Medicine

## 2020-08-13 DIAGNOSIS — R609 Edema, unspecified: Secondary | ICD-10-CM

## 2020-08-13 NOTE — Telephone Encounter (Signed)
Recently re-started HCTZ for LE edema. Has only taken med a day or two. Not seeing much improvement. Advised takes a few days for med to work. Will see Thursday if no improvement.

## 2020-08-23 ENCOUNTER — Other Ambulatory Visit: Payer: Self-pay | Admitting: Internal Medicine

## 2020-08-29 ENCOUNTER — Telehealth: Payer: Self-pay | Admitting: Internal Medicine

## 2020-08-29 DIAGNOSIS — R600 Localized edema: Secondary | ICD-10-CM

## 2020-08-29 NOTE — Telephone Encounter (Signed)
XRAY ordered, patient scheduled here tomorrow at 12:00pm

## 2020-08-29 NOTE — Telephone Encounter (Signed)
Sonya Franklin [phone]  Sonya Franklin called to say her feet are still swollen and it is time for her shot and she is having body aches.

## 2020-08-30 ENCOUNTER — Other Ambulatory Visit: Payer: Self-pay

## 2020-08-30 ENCOUNTER — Encounter: Payer: Self-pay | Admitting: Internal Medicine

## 2020-08-30 ENCOUNTER — Ambulatory Visit
Admission: RE | Admit: 2020-08-30 | Discharge: 2020-08-30 | Disposition: A | Discharge status: Home / Self Care | Payer: Medicare Other | Source: Ambulatory Visit | Attending: Internal Medicine | Admitting: Internal Medicine

## 2020-08-30 ENCOUNTER — Ambulatory Visit (INDEPENDENT_AMBULATORY_CARE_PROVIDER_SITE_OTHER): Payer: Medicare Other | Admitting: Internal Medicine

## 2020-08-30 VITALS — BP 110/70 | HR 85 | Temp 97.7°F | Ht 61.0 in | Wt 187.0 lb

## 2020-08-30 DIAGNOSIS — M450 Ankylosing spondylitis of multiple sites in spine: Secondary | ICD-10-CM

## 2020-08-30 DIAGNOSIS — R600 Localized edema: Secondary | ICD-10-CM

## 2020-08-30 DIAGNOSIS — I10 Essential (primary) hypertension: Secondary | ICD-10-CM

## 2020-08-30 DIAGNOSIS — R609 Edema, unspecified: Secondary | ICD-10-CM | POA: Diagnosis not present

## 2020-08-30 DIAGNOSIS — M457 Ankylosing spondylitis of lumbosacral region: Secondary | ICD-10-CM

## 2020-08-30 DIAGNOSIS — J9811 Atelectasis: Secondary | ICD-10-CM | POA: Diagnosis not present

## 2020-08-30 MED ORDER — TRIAMTERENE-HCTZ 37.5-25 MG PO TABS: 1.0000 | ORAL_TABLET | Freq: Every day | ORAL | 3 refills | Status: DC

## 2020-08-30 MED ORDER — METHYLPREDNISOLONE ACETATE 80 MG/ML IJ SUSP
80.0000 mg | Freq: Once | INTRAMUSCULAR | Status: AC
  Administered 2020-08-30: 80 mg via INTRAMUSCULAR

## 2020-08-30 NOTE — Progress Notes (Signed)
   Subjective:    Patient ID: Sonya Franklin, female    DOB: August 19, 1965, 55 y.o.   MRN: [id number]  HPI 55 year old Female with history of ankylosing spondylitis treated with intermittent Depo-Medrol injections from time to time here today with dependent edema.  She has a history of hypertension.  History of mild mitral regurgitation seen on echocardiogram after murmur was detected.  Left ventricular ejection fraction was normal.  History of GE reflux treated with Protonix.  Patient finding that HCTZ is not working well.  I am reluctant to put her on Lasix for dependent edema which is likely related to recent heat that we have had here in [place].  She generally does not have issues with that.  However she does take Norvasc 5 mg daily but usually that does not cause much edema.  She does take Advil as needed for musculoskeletal pain.    Review of Systems     Objective:   Physical Exam  Vital signs reviewed.  Her blood pressure is excellent 110/70 pulse 85 temperature 97.7 degrees pulse oximetry 97% weight 187 pounds.  Weight in January was 196 pounds. Neck is supple.  No carotid bruits.  Chest clear to auscultation.  Cardiac exam regular rate and rhythm.  She has trace pitting edema today.  Says it has been worse recently.     Assessment & Plan:   Lower extremity edema currently just treated with mild diuretic  Essential hypertension treated with Norvasc 5 mg daily  Ankylosing spondylitis treated with as needed injections of Depo-Medrol 80 mg IM which was given today  Plan: She will continue with amlodipine 5 mg daily.  Her blood pressure is excellent.  However I have decided to put her on Maxide 25 instead of just HCTZ 25 mg daily.  She will see how things go over the next 2 weeks and let me know if this is not helping her edema.  She was reassured that this would spare her potassium using triamterene in addition to HCTZ.

## 2020-09-01 ENCOUNTER — Encounter: Payer: Self-pay | Admitting: Internal Medicine

## 2020-09-01 NOTE — Patient Instructions (Signed)
Depo-Medrol 80 mg IM given today for ankylosing spondylitis musculoskeletal pain.  Discontinue HCTZ 25 mg daily and use Maxide 25 instead 1 tablet daily.  Let me know in 2 weeks if edema has or has not improved.  Continue amlodipine for hypertension.

## 2020-11-18 ENCOUNTER — Other Ambulatory Visit: Payer: Self-pay | Admitting: Internal Medicine

## 2020-12-12 ENCOUNTER — Telehealth: Payer: Self-pay | Admitting: Internal Medicine

## 2020-12-12 NOTE — Telephone Encounter (Signed)
Tonnie Stillman [phone]  Sonya Franklin called to say it was time for her shot, after much questioning she said she was having body aches, unable to move, moving very very slow.  She also thinks she has fluid in her ears.

## 2020-12-12 NOTE — Telephone Encounter (Signed)
LVM to CB to schedule an appointment 

## 2020-12-13 NOTE — Telephone Encounter (Signed)
Pt cB to schedule

## 2020-12-13 NOTE — Telephone Encounter (Signed)
Patient scheduled for next week 12/17/20

## 2020-12-17 ENCOUNTER — Ambulatory Visit (INDEPENDENT_AMBULATORY_CARE_PROVIDER_SITE_OTHER): Payer: Medicare Other | Admitting: Internal Medicine

## 2020-12-17 ENCOUNTER — Other Ambulatory Visit: Payer: Self-pay

## 2020-12-17 ENCOUNTER — Encounter: Payer: Self-pay | Admitting: Internal Medicine

## 2020-12-17 VITALS — BP 122/80 | HR 77 | Temp 97.7°F | Ht 61.0 in | Wt 183.5 lb

## 2020-12-17 DIAGNOSIS — Z853 Personal history of malignant neoplasm of breast: Secondary | ICD-10-CM

## 2020-12-17 DIAGNOSIS — I10 Essential (primary) hypertension: Secondary | ICD-10-CM

## 2020-12-17 DIAGNOSIS — M19031 Primary osteoarthritis, right wrist: Secondary | ICD-10-CM | POA: Diagnosis not present

## 2020-12-17 DIAGNOSIS — M459 Ankylosing spondylitis of unspecified sites in spine: Secondary | ICD-10-CM

## 2020-12-17 DIAGNOSIS — M457 Ankylosing spondylitis of lumbosacral region: Secondary | ICD-10-CM | POA: Diagnosis not present

## 2020-12-17 NOTE — Progress Notes (Signed)
   Subjective:    Patient ID: Sonya Franklin, female    DOB: Sep 17, 1965, 55 y.o.   MRN: [id number]  HPI 55 year old Female with history of ankylosing spondylitis and chronic musculoskeletal pain presents today with complaint of right wrist pain.  It has been tender and throbbing occasionally.  Does not recall any activity that would have been considered overexertion or injury.  She has been treated with Depo-Medrol 80 mg IM from time to time with history of ankylosing spondylitis of multiple sites.  She formally was seen by Dr. Justine Null, Rheumatologist who diagnosed her with ankylosing spondylitis a number of years ago.  She also has a remote history of breast cancer as well.  She has a history of hypertension.  History of mild mitral valve regurgitation on 2D echocardiogram in February 2022 with normal left ventricular ejection fraction.  She had GYN exam with Dr. Stann Mainland in February 2022.  Had Medicare annual wellness visit in January 2022.  History of right breast cancer in 2007 and was given adjuvant chemotherapy.  Tumor was ER and PR positive.  History of multinodular goiter seen by Dr. Dwyane Dee.  Had biopsy in 2006 of complex thyroid nodule right inferior lobe which showed benign colloid nodule.  She has mild hypertension treated with amlodipine.  History of GE reflux treated with Protonix.  Takes Xanax for anxiety and ibuprofen for musculoskeletal pain.  In January 2022 she had total cholesterol 252 increased from 208 in 2021 and LDL cholesterol of 140 increased from 93 in 2021.  She was reluctant to be on statin medication.  Physical exam has been booked for late January along with Medicare wellness visit.    Review of Systems see above     Objective:   Physical Exam Blood pressure 122/80 pulse 77 pulse oximetry 98% temperature 97.7 degrees  Skin: Warm and dry.  No cervical adenopathy.  No thyromegaly.  Chest clear.  Cardiac exam: Regular rate and rhythm.  Right wrist is slightly erythematous on the  dorsal aspect with increased tenderness to palpation.  Has good range of motion however.       Assessment & Plan:  Right wrist arthropathy  History of ankylosing spondylitis  Hypertension stable on current regimen  Plan: Depo-Medrol 80 mg IM per patient request.  Return in January for Medicare wellness visit, fasting labs and health maintenance exam.  Flu vaccine declined.

## 2020-12-17 NOTE — Patient Instructions (Signed)
Depo-Medrol 80 mg IM per patient request.  Continue current medications and return in January 2023 for Medicare wellness visit, fasting labs and health maintenance exam.  Declines flu vaccine.

## 2020-12-25 ENCOUNTER — Other Ambulatory Visit: Payer: Self-pay

## 2020-12-25 ENCOUNTER — Encounter: Payer: Self-pay | Admitting: Emergency Medicine

## 2020-12-25 ENCOUNTER — Telehealth: Payer: Self-pay | Admitting: Internal Medicine

## 2020-12-25 ENCOUNTER — Ambulatory Visit
Admission: EM | Admit: 2020-12-25 | Discharge: 2020-12-25 | Disposition: A | Discharge status: Home / Self Care | Payer: Medicare Other | Attending: Internal Medicine | Admitting: Internal Medicine

## 2020-12-25 DIAGNOSIS — J018 Other acute sinusitis: Secondary | ICD-10-CM

## 2020-12-25 DIAGNOSIS — Z20822 Contact with and (suspected) exposure to covid-19: Secondary | ICD-10-CM

## 2020-12-25 MED ORDER — AMOXICILLIN 875 MG PO TABS: 875.0000 mg | ORAL_TABLET | Freq: Two times a day (BID) | ORAL | 0 refills | Status: AC

## 2020-12-25 NOTE — Discharge Instructions (Addendum)
You are being treated for acute sinus infection with amoxicillin antibiotic.  Please follow-up if symptoms persist.

## 2020-12-25 NOTE — ED Triage Notes (Signed)
Patient c/o possible sinus infection, headache, bilateral ear pain since Sunday.  Patient has taken Sudafed and saline spray.  Patient is vaccinated for COVID.

## 2020-12-25 NOTE — ED Provider Notes (Signed)
[facility]    CSN: [id number] Arrival date & time: 12/25/20  1303      History   Chief Complaint Chief Complaint  Patient presents with   Sinus Infection    HPI Sonya Franklin is a 55 y.o. female.   Patient presents with 4-day history of nasal congestion, sinus pressure, headache, bilateral ear pain.  Patient has been taking Sudafed and saline spray with no improvement in symptoms.  Denies any known fevers or sick contacts.  Denies chest pain or shortness of breath.  Patient reports that she gets sinus infections often and this "feels the same".    Past Medical History:  Diagnosis Date   Ankylosing spondylitis ([place])    Anxiety    Arthritis    Breast cancer ([place])    right breast   Bursitis of hip, right    Cardiac arrhythmia    GE reflux    Hiatal hernia    Hypertension    Hyperthyroidism    Ischemic bowel disease ([place])    Snoring 03/21/2014   Vitamin D deficiency     Patient Active Problem List   Diagnosis Date Noted   Impaired glucose tolerance 09/11/2017   Hypokalemia 07/17/2016   Hyperglycemia 07/17/2016   Obesity 07/17/2016   Snoring 03/21/2014   Ankylosing spondylitis ([place]) 10/15/2013   Abdominal pain 03/19/2012   Personal history of goiter 03/19/2012   Shellfish allergy 06/13/2011   History of breast cancer 06/13/2011   Abnormal TSH 05/19/2011   Palpitations 05/19/2011   Fatigue 05/19/2011   Onychomycosis 05/19/2011   Benign essential HTN 09/02/2010   Fibromyalgia 09/02/2010   Vitamin D deficiency 09/02/2010    Past Surgical History:  Procedure Laterality Date   ABDOMINAL HYSTERECTOMY     BREAST LUMPECTOMY     right   MASTECTOMY     double    OB History   No obstetric history on file.      Home Medications    Prior to Admission medications   Medication Sig Start Date End Date Taking? Authorizing Provider  ALPRAZolam (XANAX) 0.25 MG tablet Take 1 tablet (0.25 mg total) by mouth 2 (two) times daily as needed for  anxiety. 04/08/20  Yes Baxley, Cresenciano Lick, MD  amLODipine (NORVASC) 5 MG tablet Take 1 tablet (5 mg total) by mouth daily. 04/08/20  Yes Baxley, Cresenciano Lick, MD  amoxicillin (AMOXIL) 875 MG tablet Take 1 tablet (875 mg total) by mouth 2 (two) times daily for 7 days. 12/25/20 01/01/21 Yes Odis Luster, FNP  ibuprofen (ADVIL) 800 MG tablet TAKE 1 TABLET BY MOUTH EVERY 8 HOURS AS NEEDED FOR PAIN 08/23/20  Yes Baxley, Cresenciano Lick, MD  olopatadine (PATANOL) 0.1 % ophthalmic solution Place 1 drop into both eyes 2 (two) times daily. Patient taking differently: Place 1 drop into both eyes 2 (two) times daily as needed. 06/08/16  Yes Elby Showers, MD  pantoprazole (PROTONIX) 40 MG tablet TAKE 1 TABLET BY MOUTH EVERY DAY 11/18/20  Yes Baxley, Cresenciano Lick, MD  saccharomyces boulardii (FLORASTOR) 250 MG capsule Take 1 capsule (250 mg total) by mouth 2 (two) times daily. Patient taking differently: Take 250 mg by mouth as needed. 07/18/16  Yes Barton Dubois, MD  triamcinolone cream (KENALOG) 0.1 % Apply 1 application topically 3 (three) times daily. 12/12/19  Yes Baxley, Cresenciano Lick, MD  triamterene-hydrochlorothiazide (MAXZIDE-25) 37.5-25 MG tablet Take 1 tablet by mouth daily. 08/30/20  Yes Baxley, Cresenciano Lick, MD  Vitamin D, Ergocalciferol, (DRISDOL) 1.25 MG (  50000 UNIT) CAPS capsule TAKE 1 CAPSULE BY MOUTH ONE TIME PER WEEK 08/23/20  Yes Baxley, Cresenciano Lick, MD    Family History Family History  Problem Relation Age of Onset   Breast cancer Maternal Aunt    Colitis Brother    Diabetes Mother    Hypertension Mother    Hyperlipidemia Mother    Goiter Maternal Grandmother     Social History Social History   Tobacco Use   Smoking status: Never   Smokeless tobacco: Never  Vaping Use   Vaping Use: Never used  Substance Use Topics   Alcohol use: No   Drug use: No     Allergies   Shellfish allergy and Blue dyes (parenteral)   Review of Systems Review of Systems Per HPI  Physical Exam Triage Vital Signs ED Triage Vitals   Enc Vitals Group     BP 12/25/20 1359 (!) 151/87     Pulse Rate 12/25/20 1359 61     Resp --      Temp 12/25/20 1359 98.2 F (36.8 C)     Temp Source 12/25/20 1359 Oral     SpO2 12/25/20 1359 97 %     Weight 12/25/20 1400 180 lb (81.6 kg)     Height 12/25/20 1400 5\' 2"  (1.575 m)     Head Circumference --      Peak Flow --      Pain Score 12/25/20 1400 10     Pain Loc --      Pain Edu? --      Excl. in [place]? --    No data found.  Updated Vital Signs BP (!) 151/87 (BP Location: Left Arm)   Pulse 61   Temp 98.2 F (36.8 C) (Oral)   Ht 5\' 2"  (1.575 m)   Wt 180 lb (81.6 kg)   SpO2 97%   BMI 32.92 kg/m   Visual Acuity Right Eye Distance:   Left Eye Distance:   Bilateral Distance:    Right Eye Near:   Left Eye Near:    Bilateral Near:     Physical Exam Constitutional:      General: She is not in acute distress.    Appearance: Normal appearance. She is not toxic-appearing or diaphoretic.  HENT:     Head: Normocephalic and atraumatic.     Right Ear: Ear canal normal. A middle ear effusion is present. Tympanic membrane is not erythematous or bulging.     Left Ear: Ear canal normal. A middle ear effusion is present. Tympanic membrane is not erythematous or bulging.     Nose: Congestion present.     Right Sinus: Maxillary sinus tenderness present.     Left Sinus: Maxillary sinus tenderness present.     Mouth/Throat:     Mouth: Mucous membranes are moist.     Pharynx: No posterior oropharyngeal erythema.  Eyes:     Extraocular Movements: Extraocular movements intact.     Conjunctiva/sclera: Conjunctivae normal.     Pupils: Pupils are equal, round, and reactive to light.  Cardiovascular:     Rate and Rhythm: Normal rate and regular rhythm.     Pulses: Normal pulses.     Heart sounds: Normal heart sounds.  Pulmonary:     Effort: Pulmonary effort is normal. No respiratory distress.     Breath sounds: Normal breath sounds. No wheezing.  Abdominal:     General: Abdomen  is flat. Bowel sounds are normal.     Palpations: Abdomen is soft.  Musculoskeletal:        General: Normal range of motion.     Cervical back: Normal range of motion.  Skin:    General: Skin is warm and dry.  Neurological:     General: No focal deficit present.     Mental Status: She is alert and oriented to person, place, and time. Mental status is at baseline.  Psychiatric:        Mood and Affect: Mood normal.        Behavior: Behavior normal.     UC Treatments / Results  Labs (all labs ordered are listed, but only abnormal results are displayed) Labs Reviewed  NOVEL CORONAVIRUS, NAA    EKG   Radiology No results found.  Procedures Procedures (including critical care time)  Medications Ordered in UC Medications - No data to display  Initial Impression / Assessment and Plan / UC Course  I have reviewed the triage vital signs and the nursing notes.  Pertinent labs & imaging results that were available during my care of the patient were reviewed by me and considered in my medical decision making (see chart for details).     Highly suspect that symptoms are in viral etiology, but patient has tenderness over maxillary sinuses and states that this feels similar to her chronic sinus infections.  Will treat empirically with amoxicillin x7 days.  COVID-19 PCR is pending.  Discussed over-the-counter medications to help alleviate symptoms with patient.Discussed strict return precautions. Patient verbalized understanding and is agreeable with plan.  Final Clinical Impressions(s) / UC Diagnoses   Final diagnoses:  Acute non-recurrent sinusitis of other sinus  Encounter for laboratory testing for COVID-19 virus     Discharge Instructions      You are being treated for acute sinus infection with amoxicillin antibiotic.  Please follow-up if symptoms persist.     ED Prescriptions     Medication Sig Dispense Auth. Provider   amoxicillin (AMOXIL) 875 MG tablet Take 1  tablet (875 mg total) by mouth 2 (two) times daily for 7 days. 14 tablet Odis Luster, FNP      PDMP not reviewed this encounter.   Odis Luster, [place] 12/25/20 1429

## 2020-12-25 NOTE — Telephone Encounter (Signed)
Amita Atayde [phone]  Sonya Franklin called to say she has had a headache, pressure in her face, ears ringing and feel blocked, throat is draining, horrible taste, body is achy, no fever, has had 2 COVID vaccines. I have ask her to do a home COVID test and to call back with results. She does not have any at home, I let her know she can pick one up at any local drug store.

## 2020-12-26 DIAGNOSIS — G4733 Obstructive sleep apnea (adult) (pediatric): Secondary | ICD-10-CM | POA: Diagnosis not present

## 2020-12-26 LAB — NOVEL CORONAVIRUS, NAA: SARS-CoV-2, NAA: NOT DETECTED

## 2020-12-26 LAB — SARS-COV-2, NAA 2 DAY TAT

## 2020-12-26 NOTE — Telephone Encounter (Signed)
Patient went to Urgent Care yesterday

## 2020-12-26 NOTE — Telephone Encounter (Signed)
LVM to see if patient has results of COVID test so we can get her scheduled for an appointment.

## 2021-01-03 ENCOUNTER — Telehealth: Payer: Self-pay | Admitting: Internal Medicine

## 2021-01-03 NOTE — Telephone Encounter (Signed)
Sonya Franklin [phone]  Sonya Franklin called to say her ears are still clogged up and she still has sinus drainage in her throat. She went to the Urgent Care on [address] on 12/25/2020 and she was negative for COVID, they gave her amoxicillin for 6 days.

## 2021-01-03 NOTE — Telephone Encounter (Signed)
Called Sonya Franklin again and she could not come today she is in [place] [facility], so I scheduled her for Monday.

## 2021-01-03 NOTE — Telephone Encounter (Signed)
LVM to CB to schedule appointment

## 2021-01-03 NOTE — Telephone Encounter (Signed)
error 

## 2021-01-06 ENCOUNTER — Encounter: Payer: Self-pay | Admitting: Internal Medicine

## 2021-01-06 ENCOUNTER — Ambulatory Visit (INDEPENDENT_AMBULATORY_CARE_PROVIDER_SITE_OTHER): Payer: Medicare Other | Admitting: Internal Medicine

## 2021-01-06 ENCOUNTER — Other Ambulatory Visit: Payer: Self-pay

## 2021-01-06 VITALS — BP 128/88 | HR 72 | Temp 97.7°F | Ht 62.0 in | Wt 182.0 lb

## 2021-01-06 DIAGNOSIS — R0982 Postnasal drip: Secondary | ICD-10-CM

## 2021-01-06 DIAGNOSIS — M457 Ankylosing spondylitis of lumbosacral region: Secondary | ICD-10-CM | POA: Diagnosis not present

## 2021-01-06 DIAGNOSIS — H9193 Unspecified hearing loss, bilateral: Secondary | ICD-10-CM | POA: Diagnosis not present

## 2021-01-06 DIAGNOSIS — H6123 Impacted cerumen, bilateral: Secondary | ICD-10-CM | POA: Diagnosis not present

## 2021-01-06 MED ORDER — AZITHROMYCIN 250 MG PO TABS: ORAL_TABLET | ORAL | 1 refills | Status: AC

## 2021-01-06 NOTE — Progress Notes (Signed)
   Subjective:    Patient ID: Sonya Franklin, female    DOB: [birth year], 55 y.o.   MRN: [id number]  HPI Patient was seen on October 12 at Urgent Care on [facility]. Was though to have bilateral ear effusions when seen there as well as maxillary sinus tenderness.Was diagnosed with viral syndrome. Covid test was negative. Treated with Amoxicillin for 7 days.  Symptoms have not improved.  She is complaining of ringing in her ears and postnasal drip.  Denies fever or shaking chills.  No chest pain or shortness of breath.  Says Zithromax usually works well for her.  She has a history of ankylosing spondylitis and receives Depo-Medrol injections from time to time when having severe musculoskeletal pain.  Remote history of breast cancer.  History of vitamin D deficiency, impaired glucose tolerance, hypertension, pure hypercholesterolemia, seasonal allergic rhinitis and GE reflux.  Review of Systems see above no fever, nausea or vomiting     Objective:   Physical Exam Temperature 97.7 degrees, pulse oximetry 98% weight 182 pounds ,blood pressure 128/88  Skin: Warm and dry.  Pharynx is clear.  Neck is supple.  No cervical adenopathy or thyromegaly.  TMs are obscured by cerumen.  Chest is clear to auscultation.       Assessment & Plan:  Impacted cerumen both ears  Postnasal drip and congestion  Bilateral otitis media not responding to amoxicillin  Plan: Treat with Zithromax Z-PAK 2 tabs day 1 followed by 1 tab days 2 through 5.  Needs ENT referral to have earwax removed.

## 2021-01-20 ENCOUNTER — Other Ambulatory Visit: Payer: Self-pay | Admitting: Internal Medicine

## 2021-02-10 DIAGNOSIS — H9313 Tinnitus, bilateral: Secondary | ICD-10-CM | POA: Diagnosis not present

## 2021-02-10 DIAGNOSIS — H6123 Impacted cerumen, bilateral: Secondary | ICD-10-CM | POA: Diagnosis not present

## 2021-02-11 DIAGNOSIS — C50911 Malignant neoplasm of unspecified site of right female breast: Secondary | ICD-10-CM | POA: Diagnosis not present

## 2021-02-21 ENCOUNTER — Other Ambulatory Visit: Payer: Self-pay | Admitting: Internal Medicine

## 2021-04-08 ENCOUNTER — Other Ambulatory Visit: Payer: Medicare Other | Admitting: Internal Medicine

## 2021-04-08 ENCOUNTER — Other Ambulatory Visit: Payer: Self-pay

## 2021-04-08 DIAGNOSIS — Z1329 Encounter for screening for other suspected endocrine disorder: Secondary | ICD-10-CM | POA: Diagnosis not present

## 2021-04-08 DIAGNOSIS — I10 Essential (primary) hypertension: Secondary | ICD-10-CM | POA: Diagnosis not present

## 2021-04-08 DIAGNOSIS — E7800 Pure hypercholesterolemia, unspecified: Secondary | ICD-10-CM

## 2021-04-08 LAB — COMPLETE METABOLIC PANEL WITH GFR
AG Ratio: 1.8 (calc) (ref 1.0–2.5)
ALT: 13 U/L (ref 6–29)
AST: 13 U/L (ref 10–35)
Albumin: 4.6 g/dL (ref 3.6–5.1)
Alkaline phosphatase (APISO): 77 U/L (ref 37–153)
BUN: 19 mg/dL (ref 7–25)
CO2: 33 mmol/L — ABNORMAL HIGH (ref 20–32)
Calcium: 9.7 mg/dL (ref 8.6–10.4)
Chloride: 104 mmol/L (ref 98–110)
Creat: 0.78 mg/dL (ref 0.50–1.03)
Globulin: 2.6 g/dL (calc) (ref 1.9–3.7)
Glucose, Bld: 99 mg/dL (ref 65–99)
Potassium: 4.1 mmol/L (ref 3.5–5.3)
Sodium: 141 mmol/L (ref 135–146)
Total Bilirubin: 0.3 mg/dL (ref 0.2–1.2)
Total Protein: 7.2 g/dL (ref 6.1–8.1)
eGFR: 90 mL/min/{1.73_m2} (ref >=60)

## 2021-04-08 LAB — CBC WITH DIFFERENTIAL/PLATELET
Absolute Monocytes: 561 cells/uL (ref 200–950)
Basophils Absolute: 43 cells/uL (ref 0–200)
Basophils Relative: 0.7 %
Eosinophils Absolute: 220 cells/uL (ref 15–500)
Eosinophils Relative: 3.6 %
HCT: 35.3 % (ref 35.0–45.0)
Hemoglobin: 11.6 g/dL — ABNORMAL LOW (ref 11.7–15.5)
Lymphs Abs: 1580 cells/uL (ref 850–3900)
MCH: 29.4 pg (ref 27.0–33.0)
MCHC: 32.9 g/dL (ref 32.0–36.0)
MCV: 89.4 fL (ref 80.0–100.0)
MPV: 8.8 fL (ref 7.5–12.5)
Monocytes Relative: 9.2 %
Neutro Abs: 3697 cells/uL (ref 1500–7800)
Neutrophils Relative %: 60.6 %
Platelets: 270 10*3/uL (ref 140–400)
RBC: 3.95 10*6/uL (ref 3.80–5.10)
RDW: 13.2 % (ref 11.0–15.0)
Total Lymphocyte: 25.9 %
WBC: 6.1 10*3/uL (ref 3.8–10.8)

## 2021-04-08 LAB — TSH: TSH: 0.57 mIU/L

## 2021-04-08 LAB — LIPID PANEL
Cholesterol: 254 mg/dL — ABNORMAL HIGH (ref <200)
HDL: 88 mg/dL (ref >=50)
LDL Cholesterol (Calc): 151 mg/dL (calc) — ABNORMAL HIGH
Non-HDL Cholesterol (Calc): 166 mg/dL (calc) — ABNORMAL HIGH (ref <130)
Total CHOL/HDL Ratio: 2.9 (calc) (ref <5.0)
Triglycerides: 55 mg/dL (ref <150)

## 2021-04-08 NOTE — Progress Notes (Signed)
Lab only 

## 2021-04-10 ENCOUNTER — Ambulatory Visit (INDEPENDENT_AMBULATORY_CARE_PROVIDER_SITE_OTHER): Payer: Medicare Other | Admitting: Internal Medicine

## 2021-04-10 ENCOUNTER — Encounter: Payer: Self-pay | Admitting: Internal Medicine

## 2021-04-10 ENCOUNTER — Other Ambulatory Visit: Payer: Self-pay

## 2021-04-10 VITALS — BP 124/82 | HR 85 | Temp 98.5°F | Ht 62.0 in | Wt 180.5 lb

## 2021-04-10 DIAGNOSIS — Z0000 Encounter for general adult medical examination without abnormal findings: Secondary | ICD-10-CM | POA: Diagnosis not present

## 2021-04-10 DIAGNOSIS — R600 Localized edema: Secondary | ICD-10-CM

## 2021-04-10 DIAGNOSIS — R609 Edema, unspecified: Secondary | ICD-10-CM | POA: Diagnosis not present

## 2021-04-10 DIAGNOSIS — R0989 Other specified symptoms and signs involving the circulatory and respiratory systems: Secondary | ICD-10-CM

## 2021-04-10 DIAGNOSIS — F411 Generalized anxiety disorder: Secondary | ICD-10-CM

## 2021-04-10 DIAGNOSIS — M457 Ankylosing spondylitis of lumbosacral region: Secondary | ICD-10-CM | POA: Diagnosis not present

## 2021-04-10 DIAGNOSIS — M459 Ankylosing spondylitis of unspecified sites in spine: Secondary | ICD-10-CM

## 2021-04-10 DIAGNOSIS — M450 Ankylosing spondylitis of multiple sites in spine: Secondary | ICD-10-CM

## 2021-04-10 DIAGNOSIS — I10 Essential (primary) hypertension: Secondary | ICD-10-CM

## 2021-04-10 DIAGNOSIS — E7800 Pure hypercholesterolemia, unspecified: Secondary | ICD-10-CM | POA: Diagnosis not present

## 2021-04-10 DIAGNOSIS — Z853 Personal history of malignant neoplasm of breast: Secondary | ICD-10-CM | POA: Diagnosis not present

## 2021-04-10 MED ORDER — EZETIMIBE 10 MG PO TABS: 10.0000 mg | ORAL_TABLET | Freq: Every day | ORAL | 3 refills | Status: DC

## 2021-04-10 MED ORDER — METHYLPREDNISOLONE ACETATE 80 MG/ML IJ SUSP
80.0000 mg | Freq: Once | INTRAMUSCULAR | Status: AC
  Administered 2021-04-10: 80 mg via INTRAMUSCULAR

## 2021-04-10 NOTE — Progress Notes (Signed)
Annual Wellness Visit     Patient: Sonya Franklin, Female    DOB: 1965-11-01, 56 y.o.   MRN: [id number] Visit Date: 04/10/2021  Chief Complaint  Patient presents with   Medicare Wellness   Subjective    Sonya Franklin is a 56 y.o. female who presents today for her Annual Wellness Visit.  HPI She is also here for health maintenance exam and evaluation of medical issues. She has a history of ankylosing spondylitis for which she receives Depo-Medrol IM here from time to time.  History of multinodular goiter and has seen Dr. Dwyane Dee for management but not recently.  Had biopsy in 2006 with complex thyroid nodule right inferior lobe which showed benign colloid nodule.  She has mild hypertension treated with amlodipine.  History of GE reflux treated with Protonix.  History of vitamin D deficiency.  Sometimes takes ibuprofen for musculoskeletal pain.  Takes Xanax for anxiety.  History of abdominal pain 2013 and CT showed inflammatory process with negative stool studies for C. difficile.  Had recurrent episodes of abdominal pain that responded to steroids.  Colonoscopy by Dr. Fuller Plan in 2014 was normal and repeat study is due in 2024.  History of right breast cancer in 2007.  She had adjuvant chemotherapy.  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  Has done well since that time but was not able to return to work after treatment.  She underwent a right lumpectomy and sentinel node biopsy May 2007 for T2N0 grade 2 invasive ductal carcinoma.  Tumor was strongly ER/PR positive HER2/neu negative.  She had 4 cycles of doxorubicin and cyclophosphamide followed by weekly Taxol x4.  She is status post bilateral mastectomies November 2007 with implant reconstruction.  No evidence of residual disease.  She took Femara briefly in 2012 but decided to discontinue that.  Prior to that she took tamoxifen from January 2008 until June 2011.  History of mild to moderate osteopenia on bone  density study in 2012.  T score was -1.5 in the LS spine and -0.7 in the left femoral neck.  Thyroid scan in 2010 consistent with multinodular goiter.  Social history: She receives disability benefits for history of breast cancer 2007 and ankylosing spondylitis.  She resides with her special needs son.  She is divorced.  Non-smoker.  No alcohol consumption.  Family history: Father with history of prostate cancer.  Patient's mother was diagnosed with breast cancer at age 28.  Maternal aunt with history of breast cancer.  3 brothers.  Review of systems: Has gotten along fairly well during the pandemic and basically stays at home and enjoys it.   Social History   Social History Narrative   Caffeine occasional drinker.   Not working/disabled.  12th grader. Divorced, one kids.       Social history: She receives disability benefits for history of breast cancer 2007 and ankylosing spondylitis.  She resides with special needs son.  She is divorced.  Non-smoker.  No alcohol consumption.       Family history: Father with history of prostate cancer.  Patient's mother was diagnosed with breast cancer at age 11.  Maternal aunt with history of breast cancer.  3 brothers.        Patient Care Team: Elby Showers, MD as PCP - General (Internal Medicine)  Review of Systems see above   Objective    Vitals: BP 124/82    Pulse 85    Temp 98.5 F (36.9  C) (Tympanic)    Ht $R'5\' 2"'my$  (1.575 m)    Wt 180 lb 8 oz (81.9 kg)    SpO2 97%    BMI 33.01 kg/m   Physical Exam Vitals reviewed.  Constitutional:      General: She is not in acute distress.    Appearance: Normal appearance. She is normal weight. She is not diaphoretic.  HENT:     Head: Normocephalic and atraumatic.     Right Ear: Tympanic membrane and ear canal normal.     Left Ear: Tympanic membrane and ear canal normal.     Nose: Nose normal.     Mouth/Throat:     Pharynx: Oropharynx is clear.  Eyes:     General: No scleral icterus.     Extraocular Movements: Extraocular movements intact.     Conjunctiva/sclera: Conjunctivae normal.     Pupils: Pupils are equal, round, and reactive to light.  Neck:     Vascular: No carotid bruit.  Cardiovascular:     Rate and Rhythm: Normal rate and regular rhythm.     Heart sounds: Normal heart sounds. No murmur heard.    Comments: Soft left carotid bruit evaluated in 2022 Pulmonary:     Effort: Pulmonary effort is normal.     Breath sounds: Normal breath sounds. No wheezing or rales.  Abdominal:     General: Bowel sounds are normal.     Palpations: Abdomen is soft. There is no mass.     Tenderness: There is no abdominal tenderness. There is no guarding or rebound.  Musculoskeletal:     Cervical back: Neck supple. No rigidity.     Right lower leg: No edema.     Left lower leg: No edema.  Lymphadenopathy:     Cervical: No cervical adenopathy.  Skin:    General: Skin is warm and dry.     Findings: No rash.  Neurological:     General: No focal deficit present.     Mental Status: She is alert and oriented to person, place, and time.     Cranial Nerves: No cranial nerve deficit.     Coordination: Coordination normal.     Gait: Gait normal.  Psychiatric:        Mood and Affect: Mood normal.        Behavior: Behavior normal.        Thought Content: Thought content normal.        Judgment: Judgment normal.     Most recent functional status assessment: In your present state of health, do you have any difficulty performing the following activities: 04/10/2021  Hearing? N  Vision? N  Difficulty concentrating or making decisions? N  Walking or climbing stairs? N  Dressing or bathing? N  Doing errands, shopping? N  Preparing Food and eating ? N  Using the Toilet? N  In the past six months, have you accidently leaked urine? N  Do you have problems with loss of bowel control? N  Managing your Medications? N  Managing your Finances? N  Housekeeping or managing your Housekeeping?  N  Some recent data might be hidden   Most recent fall risk assessment: Fall Risk  04/10/2021  Falls in the past year? 0  Number falls in past yr: 0  Injury with Fall? 0  Risk for fall due to : No Fall Risks  Follow up Falls evaluation completed    Most recent depression screenings: PHQ 2/9 Scores 04/10/2021 04/08/2020  PHQ - 2 Score 0  0  PHQ- 9 Score - -   Most recent cognitive screening: 6CIT Screen 04/10/2021  What Year? 0 points  What month? 0 points  What time? 0 points  Count back from 20 0 points  Months in reverse 0 points  Repeat phrase 0 points  Total Score 0       Assessment & Plan  History of left carotid bruit found in January 2022 and vascular ultrasound showed 1 to 39% stenosis.  Patient did not want to take statin medication.  She is asymptomatic.  Ankylosing spondylitis treated with as needed Depo-Medrol injections from time to time  Essential hypertension treated with HCTZ daily as well as amlodipine 5 mg daily  History of GE reflux treated with generic Protonix 40 mg daily  History of vitamin D deficiency with level of 20 in 2019 and she will take high-dose vitamin D weekly  History of right breast cancer 2007  Status post bilateral mastectomies with implant reconstruction November 2007  History of osteopenia  History of anxiety treated with twice daily Xanax as needed  History of impaired glucose tolerance treated with diet alone  Plan: Continue current medications.  Tetanus immunization is up-to-date.  Declines flu vaccine.  Return in 1 year or as needed.  I have suggested Zetia for hyperlipidemia.       Annual wellness visit done today including the all of the following: Reviewed patient's Family Medical History Reviewed and updated list of patient's medical providers Assessment of cognitive impairment was done Assessed patient's functional ability Established a written schedule for health screening [place]  Completed and Reviewed  Discussed health benefits of physical activity, and encouraged her to engage in regular exercise appropriate for her age and condition.         IElby Showers, MD, have reviewed all documentation for this visit. The documentation on 04/11/21 for the exam, diagnosis, procedures, and orders are all accurate and complete.   Angus Seller, CMA

## 2021-05-02 ENCOUNTER — Telehealth: Payer: Self-pay

## 2021-05-02 NOTE — Telephone Encounter (Signed)
Patient states that the triamterene HCTZ Is too much for her. She is getting cramping and going to the bathroom to often. She has cut it to half a pills but still having the cramping in legs and feet.

## 2021-05-02 NOTE — Telephone Encounter (Signed)
Results have been relayed to the patient. The patient verbalized understanding. No questions at this time.  She will get cuff and call back to make appt.

## 2021-05-05 ENCOUNTER — Ambulatory Visit (INDEPENDENT_AMBULATORY_CARE_PROVIDER_SITE_OTHER): Payer: Medicare Other | Admitting: Internal Medicine

## 2021-05-05 ENCOUNTER — Encounter: Payer: Self-pay | Admitting: Internal Medicine

## 2021-05-05 ENCOUNTER — Other Ambulatory Visit: Payer: Self-pay

## 2021-05-05 VITALS — BP 128/78 | HR 80 | Temp 98.3°F | Wt 186.0 lb

## 2021-05-05 DIAGNOSIS — I10 Essential (primary) hypertension: Secondary | ICD-10-CM

## 2021-05-05 DIAGNOSIS — R609 Edema, unspecified: Secondary | ICD-10-CM | POA: Diagnosis not present

## 2021-05-05 DIAGNOSIS — F411 Generalized anxiety disorder: Secondary | ICD-10-CM | POA: Diagnosis not present

## 2021-05-05 MED ORDER — OLOPATADINE HCL 0.1 % OP SOLN: 1.0000 [drp] | Freq: Two times a day (BID) | OPHTHALMIC | 99 refills | Status: DC | PRN

## 2021-05-05 MED ORDER — HYDROCHLOROTHIAZIDE 25 MG PO TABS: 25.0000 mg | ORAL_TABLET | Freq: Every day | ORAL | 3 refills | Status: DC

## 2021-05-05 NOTE — Progress Notes (Signed)
° °  Subjective:    Patient ID: Sonya Franklin, female    DOB: 1966/03/05, 56 y.o.   MRN: [id number]  HPI 56 year old Female called with complaint of leg cramps from taking Maxide 25.  She had labs drawn in late January.  BUN and creatinine were normal and potassium was normal at 4.1.  Was evaluated years ago and was felt to have ankylosing spondylitis.  Gets an injection of Depo-Medrol from time to time for musculoskeletal pain.  Remote history of breast cancer and doing well.  History of anxiety.  Gets nervous driving.  She is unemployed and receives disability benefits.    Review of Systems she called last week complaining of muscle cramping and urinary frequency.  She cut Maxide 25 and half but is still complaining of cramping in legs and feet.     Objective:   Physical Exam Her blood pressure is excellent at 128/78 pulse 80 temperature 98.3 degrees pulse oximetry 98% weight 186 pounds BMI 34.02   Chest is clear.  Cardiac exam: Regular rate and rhythm.  She appears to be anxious.  Does not like to drive on [employer].  Blood pressure is stable.  Trace lower extremity edema nonpitting.     Assessment & Plan:  Patient complaining of dependent edema on amlodipine.  Amlodipine 5 mg daily can cause some mild edema.  However her blood pressure is excellent.  She wants to stop amlodipine.  I have prescribed hydrochlorothiazide 25 mg daily.  Previously she was on Maxide 25 but she does not like the way she feels on it.  Explained to her that Maxide 25 spare her potassium.  She can stop amlodipine and monitor her blood pressure on HCTZ 25 mg alone but I am not sure it will control her blood pressure.  Anxiety state-treated with Xanax

## 2021-05-05 NOTE — Patient Instructions (Signed)
You could try just HCTZ 25 mg daily for hypertension.  This may eliminate issues with edema caused by amlodipine.  You can hold amlodipine and see how edema does over the next few days.  Discontinue Maxide 25 and just take HCTZ.  Take Xanax for anxiety

## 2021-05-10 NOTE — Patient Instructions (Signed)
I have suggested Zetia for hyperlipidemia.  Tetanus immunization is up-to-date.  Glucose intolerance treated with diet alone.  Declines flu vaccine.  Continue current medications and follow-up in 1 year or as needed.

## 2021-05-17 ENCOUNTER — Other Ambulatory Visit: Payer: Self-pay | Admitting: Internal Medicine

## 2021-05-21 ENCOUNTER — Telehealth: Payer: Self-pay | Admitting: Internal Medicine

## 2021-05-21 NOTE — Telephone Encounter (Signed)
LVM that appointment had been change from 2:45 to 2:30. To CB to confirm ?

## 2021-05-22 ENCOUNTER — Telehealth: Payer: Self-pay

## 2021-05-22 NOTE — Telephone Encounter (Signed)
Called to confirm new appt. ?

## 2021-05-22 NOTE — Telephone Encounter (Signed)
Patient notified. Sonya Franklin is checking on how to do this.  ?

## 2021-05-22 NOTE — Telephone Encounter (Signed)
Referral is placed.

## 2021-05-22 NOTE — Telephone Encounter (Signed)
Patient called stating that the water pill part of her blood pressure medicine is too much. She states that the HCTZ is making her go to the bathroom way too much. She is not checking her BP but states that she knows her body and needs a new medication without the diuretic. Please advise.  ?

## 2021-06-02 ENCOUNTER — Other Ambulatory Visit: Payer: Self-pay

## 2021-06-02 ENCOUNTER — Telehealth: Payer: Self-pay

## 2021-06-02 NOTE — Telephone Encounter (Signed)
Patient states that the blood pressure medication HCTZ  is now causing to have itching. She would like to go bak to plan amlodipine without the diuretic. Please advise.  ?

## 2021-06-02 NOTE — Telephone Encounter (Signed)
Detailed message left on identifiable machine belonging to the patient. Advised to call with any questions or concerns.   

## 2021-06-02 NOTE — Telephone Encounter (Signed)
Appointment with [facility] on 08/25/21, Dr Skeet Latch ?

## 2021-06-02 NOTE — Telephone Encounter (Signed)
She has an appt. So to clarify she is stopping the HCTZ and only taking the amlodipine correct? ? ?

## 2021-06-12 ENCOUNTER — Encounter: Payer: Self-pay | Admitting: Cardiovascular Disease

## 2021-06-25 DIAGNOSIS — G4733 Obstructive sleep apnea (adult) (pediatric): Secondary | ICD-10-CM | POA: Diagnosis not present

## 2021-07-17 ENCOUNTER — Other Ambulatory Visit: Payer: Medicare Other

## 2021-07-17 DIAGNOSIS — E7800 Pure hypercholesterolemia, unspecified: Secondary | ICD-10-CM

## 2021-07-18 LAB — LIPID PANEL
Cholesterol: 181 mg/dL (ref <200)
HDL: 64 mg/dL (ref >=50)
LDL Cholesterol (Calc): 100 mg/dL (calc) — ABNORMAL HIGH
Non-HDL Cholesterol (Calc): 117 mg/dL (calc) (ref <130)
Total CHOL/HDL Ratio: 2.8 (calc) (ref <5.0)
Triglycerides: 76 mg/dL (ref <150)

## 2021-07-24 ENCOUNTER — Ambulatory Visit (INDEPENDENT_AMBULATORY_CARE_PROVIDER_SITE_OTHER): Payer: Medicare Other | Admitting: Internal Medicine

## 2021-07-24 ENCOUNTER — Encounter: Payer: Self-pay | Admitting: Internal Medicine

## 2021-07-24 VITALS — BP 112/80 | HR 68 | Temp 98.1°F | Wt 182.2 lb

## 2021-07-24 DIAGNOSIS — I10 Essential (primary) hypertension: Secondary | ICD-10-CM

## 2021-07-24 DIAGNOSIS — M457 Ankylosing spondylitis of lumbosacral region: Secondary | ICD-10-CM | POA: Diagnosis not present

## 2021-07-24 DIAGNOSIS — Z853 Personal history of malignant neoplasm of breast: Secondary | ICD-10-CM

## 2021-07-24 DIAGNOSIS — J0100 Acute maxillary sinusitis, unspecified: Secondary | ICD-10-CM

## 2021-07-24 DIAGNOSIS — E7800 Pure hypercholesterolemia, unspecified: Secondary | ICD-10-CM

## 2021-07-24 DIAGNOSIS — H6503 Acute serous otitis media, bilateral: Secondary | ICD-10-CM | POA: Diagnosis not present

## 2021-07-24 MED ORDER — TRIAMTERENE-HCTZ 37.5-25 MG PO TABS: 1.0000 | ORAL_TABLET | Freq: Every day | ORAL | 0 refills | Status: DC

## 2021-07-24 MED ORDER — METHYLPREDNISOLONE ACETATE 80 MG/ML IJ SUSP
80.0000 mg | Freq: Once | INTRAMUSCULAR | Status: AC
  Administered 2021-07-24: 80 mg via INTRAMUSCULAR

## 2021-07-24 NOTE — Progress Notes (Signed)
   Subjective:    Patient ID: Sonya Franklin, female    DOB: [birth year], 56 y.o.   MRN: [id number]  HPI  56 year old Female with history of ankylosing spondylitis here for follow-up on hypertension.  However having some allergy type symptoms.  She also has a history of pure hypercholesterolemia and in January total cholesterol was 254 and is now 181.  LDL was 151 and is now 100.  Has been on Zetia 10 mg daily since January.  This is working well for her and of course does not have any side effects that are bothersome.  In March complained that Maxide 25 was too strong for her.  She had not checked her blood pressure but reported that she needed a new medication without diuretic.  We recommended referral to hypertension clinic but she has not been yet.  Records indicate she is on amlodipine 5 mg daily.  Still having some issues with edema and was prescribed today Maxide 25 daily.  She has taken this in the past with success.  Says she needs another Depo-Medrol injection because ankylosing spondylitis symptoms are flaring up.  Having some respiratory congestion symptoms.    Review of Systems see above-no fever or shaking chills.     Objective:   Physical Exam Her blood pressure is excellent at 112/80 pulse 68 regular temperature 98.1 degrees pulse oximetry 98% BMI 33.33 weight 182 pounds 4 ounces  Skin: Warm and dry.  Both TMs are full bilaterally consistent with serous otitis media.  Pharynx is clear.  Neck is supple.  Chest is clear to auscultation.  Trace pitting edema of the lower extremities.       Assessment & Plan:  Ankylosing spondylitis-receives Depo-Medrol 80 mg IM injections from time to time and 1 was given today.  Generally gets good relief with this treatment.  Essential hypertension-she will continue amlodipine 5 mg daily and try Maxide 25 daily.  Amlodipine may be causing some dependent edema but usually does so at a higher dose in hot weather.  Bilateral serous otitis  media to be treated with amoxicillin 500 mg 3 times a day for 10 days.  Depo-Medrol should help this problem as well.  Pure hypercholesterolemia-lipids are excellent on Zetia.  Plan: She has an appointment to see Dr. Oval Linsey in June and hypertensive clinic.  She should keep that appointment.

## 2021-08-11 NOTE — Patient Instructions (Addendum)
Depo-Medrol 80 mg IM given today for ankylosing spondylitis and allergy symptoms..  For hypertension, continue amlodipine and Maxide 25.  Take Amoxicillin 500 mg 3 times a day for 10 days for bilateral serous otitis media.  Depo-Medrol should also help this as well.  Continue Zetia as her lipid panel is excellent.  We are making referral to hypertension clinic for you to discuss your concerns.

## 2021-08-22 ENCOUNTER — Telehealth: Payer: Self-pay | Admitting: Internal Medicine

## 2021-08-22 MED ORDER — HYOSCYAMINE SULFATE 0.125 MG PO TBDP: 0.1250 mg | ORAL_TABLET | ORAL | 0 refills | Status: DC | PRN

## 2021-08-22 NOTE — Telephone Encounter (Addendum)
Sonya Franklin [phone]  [name] called to say she ate some [place] last night and her stomach started flipping and cramping, hurting and she needs to get a refill on below medication if possible.  She also had tick bite last week, so far no issues.  hyoscyamine (LEVSIN SL) 0.125 MG SL tablet   [id number] [address] Phone:  [phone] Fax:  [phone]

## 2021-08-22 NOTE — Telephone Encounter (Signed)
Refilled

## 2021-08-25 ENCOUNTER — Ambulatory Visit (HOSPITAL_BASED_OUTPATIENT_CLINIC_OR_DEPARTMENT_OTHER): Payer: Medicare Other | Admitting: Cardiovascular Disease

## 2021-08-25 NOTE — Progress Notes (Signed)
[facility] Initial Assessment:    Date:  08/26/2021   ID:  Sonya Franklin, DOB 05/26/1965, MRN [id number]  PCP:  Elby Showers, MD  Cardiologist:  None  Nephrologist:  Referring MD: Elby Showers, MD   CC: Hypertension  History of Present Illness:    Sonya Franklin is a 56 y.o. female with a hx of hypertension, hyperthyroidism, ankylosing spondylitis, arthritis, right breast cancer, and ischemic bowel disease here to establish care in the [facility].   She saw Dr. Renold Genta 07/2021, and her BP was elevated on amlodipine and maxide. Amlodipine was contributing to her edema. She was referred to [facility] for management. She had na Echo 06/2020 for a murmur; LVEF was 60-65% with normal diastolic function. She had mild carotid stenosis bilaterally on dopplers 03/2020. Today, she reports she still has occasional swelling in her legs, so she has been taking Midol as needed, which has been helping her. She also complains of tinnitus, but this is not as severe as it was while on her previous antihypertensive. She reports that her blood pressure has been low at office visits, but she is not monitoring much at home. At one time she felt like she was going to pass out, which she attributes to low blood pressures. Lately her most strenuous activity has been climbing stairs. Her exercise is mostly limited by her ankylosing spondylitis and arthritis. She notes that most daily activities can be difficult. She is interested in starting with a yoga class. Regarding her diet, she usually orders out and has pork, chicken or fish. She suffers from significant GI symptoms if she eats steak or other red meats. She does not add extra salt to her meals. At home she is compliant with using her CPAP. She denies any palpitations, chest pain, or shortness of breath. No lightheadedness, headaches, orthopnea, or PND.   Past Medical History:  Diagnosis Date   Ankylosing  spondylitis ([place])    Anxiety    Arthritis    Breast cancer ([place])    right breast   Bursitis of hip, right    Cardiac arrhythmia    Carotid stenosis 08/26/2021   GE reflux    Hiatal hernia    Hypertension    Hyperthyroidism    Ischemic bowel disease ([place])    Prediabetes 08/26/2021   Snoring 03/21/2014   Vitamin D deficiency     Past Surgical History:  Procedure Laterality Date   ABDOMINAL HYSTERECTOMY     BREAST LUMPECTOMY     right   MASTECTOMY     double    Current Medications: Current Meds  Medication Sig   ALPRAZolam (XANAX) 0.25 MG tablet TAKE 1 TABLET (0.25 MG TOTAL) BY MOUTH 2 (TWO) TIMES DAILY AS NEEDED FOR ANXIETY.   amLODipine (NORVASC) 5 MG tablet TAKE 1 TABLET (5 MG TOTAL) BY MOUTH DAILY.   amoxicillin (AMOXIL) 500 MG capsule Take 500 mg by mouth as needed.   furosemide (LASIX) 20 MG tablet Take 1 tablet (20 mg total) by mouth daily as needed.   hyoscyamine (ANASPAZ) 0.125 MG TBDP disintergrating tablet Place 1 tablet (0.125 mg total) under the tongue every 4 (four) hours as needed for cramping.   ibuprofen (ADVIL) 800 MG tablet TAKE 1 TABLET BY MOUTH EVERY 8 HOURS AS NEEDED FOR PAIN   olopatadine (PATANOL) 0.1 % ophthalmic solution Place 1 drop into both eyes 2 (two) times daily as needed.   pantoprazole (PROTONIX) 40 MG tablet TAKE  1 TABLET BY MOUTH EVERY DAY   saccharomyces boulardii (FLORASTOR) 250 MG capsule Take 1 capsule (250 mg total) by mouth 2 (two) times daily. (Patient taking differently: Take 250 mg by mouth as needed.)   triamcinolone cream (KENALOG) 0.1 % Apply 1 application topically 3 (three) times daily.     Allergies:   Shellfish allergy, Blue dyes (parenteral), and Hydrochlorothiazide   Social History   Socioeconomic History   Marital status: Legally Separated    Spouse name: Not on file   Number of children: 1   Years of education: Not on file   Highest education level: Not on file  Occupational History    Employer: UNEMPLOYED  Tobacco  Use   Smoking status: Never   Smokeless tobacco: Never  Vaping Use   Vaping Use: Never used  Substance and Sexual Activity   Alcohol use: No   Drug use: No   Sexual activity: Yes  Other Topics Concern   Not on file  Social History Narrative   Caffeine occasional drinker.   Not working/disabled.  12th grader. Divorced, one kids.       Social history: She receives disability benefits for history of breast cancer 2007 and ankylosing spondylitis.  She resides with special needs son.  She is divorced.  Non-smoker.  No alcohol consumption.       Family history: Father with history of prostate cancer.  Patient's mother was diagnosed with breast cancer at age 49.  Maternal aunt with history of breast cancer.  3 brothers.       Social Determinants of Health   Financial Resource Strain: Low Risk  (08/26/2021)   Overall Financial Resource Strain (CARDIA)    Difficulty of Paying Living Expenses: Not hard at all  Food Insecurity: No Food Insecurity (08/26/2021)   Hunger Vital Sign    Worried About Running Out of Food in the Last Year: Never true    Ran Out of Food in the Last Year: Never true  Transportation Needs: No Transportation Needs (08/26/2021)   PRAPARE - Hydrologist (Medical): No    Lack of Transportation (Non-Medical): No  Physical Activity: Inactive (08/26/2021)   Exercise Vital Sign    Days of Exercise per Week: 0 days    Minutes of Exercise per Session: 0 min  Stress: Not on file  Social Connections: Not on file     Family History: The patient's family history includes Breast cancer in her maternal aunt; Colitis in her brother; Diabetes in her mother; Goiter in her maternal grandmother; Hyperlipidemia in her mother; Hypertension in her mother.  ROS:   Please see the history of present illness.    (+) LE edema (+) Tinnitus (+) Near syncope (+) Arthralgias All other systems reviewed and are negative.  EKGs/Labs/Other Studies Reviewed:     Echo  04/19/2020: Sonographer Comments: Image acquisition challenging due to breast  implants.  IMPRESSIONS    1. Left ventricular ejection fraction, by estimation, is 60 to 65%. The  left ventricle has normal function. The left ventricle has no regional  wall motion abnormalities. Left ventricular diastolic parameters were  normal.   2. Right ventricular systolic function is normal. The right ventricular  size is normal.   3. The mitral valve is normal in structure. Mild mitral valve  regurgitation.   4. The aortic valve is normal in structure. Aortic valve regurgitation is  not visualized.   5. The inferior vena cava is normal in size with greater  than 50%  respiratory variability, suggesting right atrial pressure of 3 mmHg.   Bilateral Carotid Doppler 04/09/2020: Summary:  Right Carotid: Velocities in the right ICA are consistent with a 1-39%  stenosis                 by ICA/CCA ratio.   Left Carotid: Velocities in the left ICA are consistent with a 1-39%  stenosis.   Vertebrals:  Bilateral vertebral arteries demonstrate antegrade flow.  Subclavians: Normal flow hemodynamics were seen in bilateral subclavian               arteries.    EKG:  EKG is personally reviewed. 08/26/2021: Sinus rhythm. Rate 73 bpm.  Recent Labs: 04/08/2021: ALT 13; BUN 19; Creat 0.78; Hemoglobin 11.6; Platelets 270; Potassium 4.1; Sodium 141; TSH 0.57   Recent Lipid Panel    Component Value Date/Time   CHOL 181 07/17/2021 1118   TRIG 76 07/17/2021 1118   HDL 64 07/17/2021 1118   CHOLHDL 2.8 07/17/2021 1118   VLDL 26 05/28/2015 0343   LDLCALC 100 (H) 07/17/2021 1118    Physical Exam:    VS:  BP 124/80 (BP Location: Left Arm, Patient Position: Sitting, Cuff Size: Large)   Pulse 73   Ht '5\' 2"'$  (5.003 m)   Wt 181 lb 8 oz (82.3 kg)   BMI 33.20 kg/m  , BMI Body mass index is 33.2 kg/m. GENERAL:  Well appearing HEENT: Pupils equal round and reactive, fundi not visualized, oral mucosa  unremarkable NECK:  No jugular venous distention, waveform within normal limits, carotid upstroke brisk and symmetric, no bruits, no thyromegaly LYMPHATICS:  No cervical adenopathy LUNGS:  Clear to auscultation bilaterally HEART:  RRR.  PMI not displaced or sustained,S1 and S2 within normal limits, no S3, no S4, no clicks, no rubs, no murmurs ABD:  Flat, positive bowel sounds normal in frequency in pitch, no bruits, no rebound, no guarding, no midline pulsatile mass, no hepatomegaly, no splenomegaly EXT:  2 plus pulses throughout, no edema, RLE tenderness on palpation, no cyanosis no clubbing SKIN:  No rashes no nodules NEURO:  Cranial nerves II through XII grossly intact, motor grossly intact throughout PSYCH:  Cognitively intact, oriented to person place and time   ASSESSMENT/PLAN:    Benign essential HTN Blood pressure is well controlled.  She seems to be doing well now on amlodipine.  Edema limitations and not eating out so frequently.  She is already doing a good job of avoiding red meat.  We discussed limiting pork as well.  Her exercise is limited due to ankylosing spondylitis.  We discussed yoga and water activities.  She will consider this and is interested in enrolling in the prep program at the [facility].  She does not need to be enrolled in our remote patient monitoring study.  Now that she stopped She occasionally has some swelling and tightness in her legs.  No other evidence of heart failure on exam.  We will give furosemide 20 mg to take as needed.  Carotid stenosis Mild carotid stenosis bilaterally on prior imaging.  We discussed the fact that her LDL should be less than 70.  She did not tolerate Zetia due to leg heaviness.  She decided to take Metamucil instead.  We discussed the fact that this is unlikely to get her to goal.  Recommend that she repeat her lab work in 2 to 3 months with her PCP.  If she is not at goal would consider starting a statin.  Her LDL has been as high as the  150s.  Also recommended that she increase exercise and referred to prep as above.  Prediabetes Hemoglobin A1c was 6.0%.  When we discussed this she replied "if it is not 11%, I am not going to worry about it."  Recommended exercise and dietary changes as above.   Screening for Secondary Hypertension:     08/26/2021    3:52 PM  Causes  Drugs/Herbals Screened     - Comments Eats out.  High sodium.  No caffeine/EtOH.  NSAIDS  Renovascular HTN N/A  Sleep Apnea Screened     - Comments CPAP  Thyroid Disease Screened  Hyperaldosteronism Screened    Relevant Labs/Studies:    Latest Ref Rng & Units 04/08/2021   11:33 AM 04/05/2020   11:28 AM 03/31/2019    9:16 AM  Basic Labs  Sodium 135 - 146 mmol/L 141  141  141   Potassium 3.5 - 5.3 mmol/L 4.1  4.0  4.0   Creatinine 0.50 - 1.03 mg/dL 0.78  0.52  0.72        Latest Ref Rng & Units 04/08/2021   11:33 AM 04/05/2020   11:28 AM  Thyroid   TSH mIU/L 0.57  0.57      Disposition:    FU with Verneda Hollopeter C. Oval Linsey, MD, [facility] as needed.   Medication Adjustments/Labs and Tests Ordered: Current medicines are reviewed at length with the patient today.  Concerns regarding medicines are outlined above.   Orders Placed This Encounter  Procedures   Amb Referral To Provider Referral Exercise Program (P.R.E.P)   EKG 12-Lead   Meds ordered this encounter  Medications   furosemide (LASIX) 20 MG tablet    Sig: Take 1 tablet (20 mg total) by mouth daily as needed.    Dispense:  10 tablet    Refill:  1   I,Mathew Stumpf,acting as a scribe for Skeet Latch, MD.,have documented all relevant documentation on the behalf of Skeet Latch, MD,as directed by  Skeet Latch, MD while in the presence of Skeet Latch, MD.  .tcrcs   Signed, Skeet Latch, MD  08/26/2021 5:02 PM    [facility]

## 2021-08-26 ENCOUNTER — Ambulatory Visit (HOSPITAL_BASED_OUTPATIENT_CLINIC_OR_DEPARTMENT_OTHER): Payer: Medicare Other | Admitting: Cardiovascular Disease

## 2021-08-26 ENCOUNTER — Encounter (HOSPITAL_BASED_OUTPATIENT_CLINIC_OR_DEPARTMENT_OTHER): Payer: Self-pay | Admitting: Cardiovascular Disease

## 2021-08-26 DIAGNOSIS — I10 Essential (primary) hypertension: Secondary | ICD-10-CM

## 2021-08-26 DIAGNOSIS — I6523 Occlusion and stenosis of bilateral carotid arteries: Secondary | ICD-10-CM

## 2021-08-26 DIAGNOSIS — I6529 Occlusion and stenosis of unspecified carotid artery: Secondary | ICD-10-CM | POA: Insufficient documentation

## 2021-08-26 DIAGNOSIS — R7303 Prediabetes: Secondary | ICD-10-CM

## 2021-08-26 HISTORY — DX: Occlusion and stenosis of unspecified carotid artery: I65.29

## 2021-08-26 HISTORY — DX: Prediabetes: R73.03

## 2021-08-26 MED ORDER — FUROSEMIDE 20 MG PO TABS: 20.0000 mg | ORAL_TABLET | Freq: Every day | ORAL | 1 refills | Status: DC | PRN

## 2021-08-26 NOTE — Assessment & Plan Note (Signed)
Hemoglobin A1c was 6.0%.  When we discussed this she replied "if it is not 11%, I am not going to worry about it."  Recommended exercise and dietary changes as above.

## 2021-08-26 NOTE — Assessment & Plan Note (Signed)
Mild carotid stenosis bilaterally on prior imaging.  We discussed the fact that her LDL should be less than 70.  She did not tolerate Zetia due to leg heaviness.  She decided to take Metamucil instead.  We discussed the fact that this is unlikely to get her to goal.  Recommend that she repeat her lab work in 2 to 3 months with her PCP.  If she is not at goal would consider starting a statin.  Her LDL has been as high as the 150s.  Also recommended that she increase exercise and referred to prep as above.

## 2021-08-26 NOTE — Patient Instructions (Signed)
Medication Instructions:  TRY LASIX (FUROSEMIDE) 20 MG AS NEEDED   Labwork: NONE  Testing/Procedures: NONE  Follow-Up: AS NEEDED   Any Other Special Instructions Will Be Listed Below (If Applicable).  PAM OR LORA FROM PREP ([facility]) PROGRAM WILL BE IN [facility]   If you need a refill on your cardiac medications before your next appointment, please call your pharmacy.

## 2021-08-26 NOTE — Assessment & Plan Note (Signed)
Blood pressure is well controlled.  She seems to be doing well now on amlodipine.  Edema limitations and not eating out so frequently.  She is already doing a good job of avoiding red meat.  We discussed limiting pork as well.  Her exercise is limited due to ankylosing spondylitis.  We discussed yoga and water activities.  She will consider this and is interested in enrolling in the prep program at the [facility].  She does not need to be enrolled in our remote patient monitoring study.  Now that she stopped She occasionally has some swelling and tightness in her legs.  No other evidence of heart failure on exam.  We will give furosemide 20 mg to take as needed.

## 2021-08-29 ENCOUNTER — Telehealth: Payer: Self-pay

## 2021-08-29 NOTE — Telephone Encounter (Signed)
Call placed to pt reference PREP referral LVMT requesting call back

## 2021-09-10 ENCOUNTER — Ambulatory Visit (INDEPENDENT_AMBULATORY_CARE_PROVIDER_SITE_OTHER): Payer: Medicare Other

## 2021-09-10 ENCOUNTER — Ambulatory Visit
Admission: EM | Admit: 2021-09-10 | Discharge: 2021-09-10 | Disposition: A | Discharge status: Home / Self Care | Payer: Medicare Other | Attending: Family Medicine | Admitting: Family Medicine

## 2021-09-10 DIAGNOSIS — R079 Chest pain, unspecified: Secondary | ICD-10-CM | POA: Diagnosis not present

## 2021-09-10 DIAGNOSIS — J189 Pneumonia, unspecified organism: Secondary | ICD-10-CM | POA: Diagnosis not present

## 2021-09-10 DIAGNOSIS — R0789 Other chest pain: Secondary | ICD-10-CM

## 2021-09-10 MED ORDER — DOXYCYCLINE HYCLATE 100 MG PO CAPS: 100.0000 mg | ORAL_CAPSULE | Freq: Two times a day (BID) | ORAL | 0 refills | Status: AC

## 2021-09-10 NOTE — ED Provider Notes (Signed)
[facility]    CSN: [id number] Arrival date & time: 09/10/21  0934      History   Chief Complaint Chief Complaint  Patient presents with   chest soreness    HPI Sonya Franklin is a 56 y.o. female.   HPI Here for sharp pain in her right upper anterior chest that started yesterday.  It hurts to breathe deeply, and hurts when she coughs, and it does hurt some when she moves her right arm.  She is not really having any persistent cough or fever or sore throat.  Past Medical History:  Diagnosis Date   Ankylosing spondylitis ([place])    Anxiety    Arthritis    Breast cancer ([place])    right breast   Bursitis of hip, right    Cardiac arrhythmia    Carotid stenosis 08/26/2021   GE reflux    Hiatal hernia    Hypertension    Hyperthyroidism    Ischemic bowel disease ([place])    Prediabetes 08/26/2021   Snoring 03/21/2014   Vitamin D deficiency     Patient Active Problem List   Diagnosis Date Noted   Carotid stenosis 08/26/2021   Prediabetes 08/26/2021   Impaired glucose tolerance 09/11/2017   Hypokalemia 07/17/2016   Hyperglycemia 07/17/2016   Obesity 07/17/2016   Snoring 03/21/2014   Ankylosing spondylitis ([place]) 10/15/2013   Abdominal pain 03/19/2012   Personal history of goiter 03/19/2012   Shellfish allergy 06/13/2011   History of breast cancer 06/13/2011   Abnormal TSH 05/19/2011   Palpitations 05/19/2011   Fatigue 05/19/2011   Onychomycosis 05/19/2011   Benign essential HTN 09/02/2010   Fibromyalgia 09/02/2010   Vitamin D deficiency 09/02/2010    Past Surgical History:  Procedure Laterality Date   ABDOMINAL HYSTERECTOMY     BREAST LUMPECTOMY     right   MASTECTOMY     double    OB History   No obstetric history on file.      Home Medications    Prior to Admission medications   Medication Sig Start Date End Date Taking? Authorizing Provider  doxycycline (VIBRAMYCIN) 100 MG capsule Take 1 capsule (100 mg total) by mouth 2 (two) times daily  for 7 days. 09/10/21 09/17/21 Yes Ellieanna Funderburg, Gwenlyn Perking, MD  ALPRAZolam Duanne Moron) 0.25 MG tablet TAKE 1 TABLET (0.25 MG TOTAL) BY MOUTH 2 (TWO) TIMES DAILY AS NEEDED FOR ANXIETY. 02/22/21   Elby Showers, MD  amLODipine (NORVASC) 5 MG tablet TAKE 1 TABLET (5 MG TOTAL) BY MOUTH DAILY. 05/18/21   Elby Showers, MD  furosemide (LASIX) 20 MG tablet Take 1 tablet (20 mg total) by mouth daily as needed. 08/26/21 11/24/21  Skeet Latch, MD  hyoscyamine (ANASPAZ) 0.125 MG TBDP disintergrating tablet Place 1 tablet (0.125 mg total) under the tongue every 4 (four) hours as needed for cramping. 08/22/21   Elby Showers, MD  ibuprofen (ADVIL) 800 MG tablet TAKE 1 TABLET BY MOUTH EVERY 8 HOURS AS NEEDED FOR PAIN 01/20/21   Elby Showers, MD  olopatadine (PATANOL) 0.1 % ophthalmic solution Place 1 drop into both eyes 2 (two) times daily as needed. 05/05/21   Elby Showers, MD  pantoprazole (PROTONIX) 40 MG tablet TAKE 1 TABLET BY MOUTH EVERY DAY 11/18/20   Elby Showers, MD  saccharomyces boulardii (FLORASTOR) 250 MG capsule Take 1 capsule (250 mg total) by mouth 2 (two) times daily. Patient taking differently: Take 250 mg by mouth as needed. 07/18/16  Barton Dubois, MD  triamcinolone cream (KENALOG) 0.1 % Apply 1 application topically 3 (three) times daily. 12/12/19   Elby Showers, MD    Family History Family History  Problem Relation Age of Onset   Breast cancer Maternal Aunt    Colitis Brother    Diabetes Mother    Hypertension Mother    Hyperlipidemia Mother    Goiter Maternal Grandmother     Social History Social History   Tobacco Use   Smoking status: Never   Smokeless tobacco: Never  Vaping Use   Vaping Use: Never used  Substance Use Topics   Alcohol use: No   Drug use: No     Allergies   Shellfish allergy, Blue dyes (parenteral), and Hydrochlorothiazide   Review of Systems Review of Systems   Physical Exam Triage Vital Signs ED Triage Vitals [09/10/21 1059]  Enc Vitals Group      BP (!) 150/99     Pulse Rate 80     Resp 18     Temp 98 F (36.7 C)     Temp Source Oral     SpO2 95 %     Weight      Height      Head Circumference      Peak Flow      Pain Score 0     Pain Loc      Pain Edu?      Excl. in [place]?    No data found.  Updated Vital Signs BP (!) 150/99 (BP Location: Left Arm)   Pulse 80   Temp 98 F (36.7 C) (Oral)   Resp 18   SpO2 95%   Visual Acuity Right Eye Distance:   Left Eye Distance:   Bilateral Distance:    Right Eye Near:   Left Eye Near:    Bilateral Near:     Physical Exam Vitals reviewed.  Constitutional:      General: She is not in acute distress.    Appearance: She is not ill-appearing, toxic-appearing or diaphoretic.  HENT:     Mouth/Throat:     Mouth: Mucous membranes are moist.  Eyes:     Extraocular Movements: Extraocular movements intact.     Pupils: Pupils are equal, round, and reactive to light.  Cardiovascular:     Rate and Rhythm: Normal rate and regular rhythm.  Pulmonary:     Effort: Pulmonary effort is normal.     Breath sounds: Normal breath sounds.  Chest:     Chest wall: Tenderness (right upper anterior chest, about rib space 2-3) present.  Musculoskeletal:     Cervical back: Neck supple.  Lymphadenopathy:     Cervical: No cervical adenopathy.  Neurological:     Mental Status: She is alert and oriented to person, place, and time.  Psychiatric:        Behavior: Behavior normal.      UC Treatments / Results  Labs (all labs ordered are listed, but only abnormal results are displayed) Labs Reviewed - No data to display  EKG   Radiology DG Chest 2 View  Result Date: 09/10/2021 CLINICAL DATA:  RIGHT anterior chest pain EXAM: CHEST - 2 VIEW COMPARISON:  08/30/2020 FINDINGS: Upper-normal size of cardiac silhouette. Mediastinal contours and pulmonary vascularity normal. New RIGHT middle lobe infiltrate consistent with pneumonia. Remaining lungs clear. No pleural effusion or pneumothorax.  Osseous structures unremarkable. IMPRESSION: New RIGHT middle lobe infiltrate consistent with pneumonia. Electronically Signed   By: Lavonia Dana  M.D.   On: 09/10/2021 11:47    Procedures Procedures (including critical care time)  Medications Ordered in UC Medications - No data to display  Initial Impression / Assessment and Plan / UC Course  I have reviewed the triage vital signs and the nursing notes.  Pertinent labs & imaging results that were available during my care of the patient were reviewed by me and considered in my medical decision making (see chart for details).     CXR shows RML infiltrate. Will tx with doxy. She already has ibuprofen 800 for her AS. She declines med for cough. Final Clinical Impressions(s) / UC Diagnoses   Final diagnoses:  Pneumonia of right middle lobe due to infectious organism     Discharge Instructions      Take doxycycline 100 mg--1 capsule 2 times daily for 7 days  To the emergency room if you become weak or short of breath.     ED Prescriptions     Medication Sig Dispense Auth. Provider   doxycycline (VIBRAMYCIN) 100 MG capsule Take 1 capsule (100 mg total) by mouth 2 (two) times daily for 7 days. 14 capsule Windy Carina, Gwenlyn Perking, MD      PDMP not reviewed this encounter.   Barrett Henle, MD 09/10/21 [phone]

## 2021-09-10 NOTE — Telephone Encounter (Signed)
Sonya Franklin [phone]  Sonya Franklin called to say she has a pulled muscle or something in her chest, when she breaths deep she can feel it, or if she coughs she can feel it. She does not know what she could have done to it.  She was wanting to be seen today and get an xray. I let know you are not here on Wednesdays, and I can send you a note. She ask if she should just go to an urgent care, I said she could do that, because they could xray there and maybe do some blood work. She was worried with Holiday coming up.

## 2021-09-10 NOTE — ED Triage Notes (Signed)
Pt c/o chest hurting when she coughs, onset yesterday. Concerned for gas or pulled muscle. Not relieved w/ gas x or tums.

## 2021-09-10 NOTE — Discharge Instructions (Addendum)
Take doxycycline 100 mg--1 capsule 2 times daily for 7 days  To the emergency room if you become weak or short of breath.

## 2021-09-11 ENCOUNTER — Other Ambulatory Visit: Payer: Self-pay | Admitting: Internal Medicine

## 2021-09-11 NOTE — Telephone Encounter (Signed)
Called to check on Sonya Franklin, she stated she has little dry cough today. She does not need anything for it as of yet, she will call back if that changes. We scheduled a PNA follow up for next Thursday. I let her know that Dr Renold Genta said fr her to walk plenty but not to over do. Keep eye on pulse OX, not to hesitate to call if she needs something for cough, or anything and not to over do.

## 2021-09-18 ENCOUNTER — Ambulatory Visit (INDEPENDENT_AMBULATORY_CARE_PROVIDER_SITE_OTHER): Payer: Medicare Other | Admitting: Internal Medicine

## 2021-09-18 ENCOUNTER — Encounter: Payer: Self-pay | Admitting: Internal Medicine

## 2021-09-18 VITALS — BP 130/88 | HR 90 | Temp 97.8°F | Wt 180.8 lb

## 2021-09-18 DIAGNOSIS — J189 Pneumonia, unspecified organism: Secondary | ICD-10-CM | POA: Diagnosis not present

## 2021-09-18 MED ORDER — AZITHROMYCIN 250 MG PO TABS: ORAL_TABLET | ORAL | 0 refills | Status: AC

## 2021-09-18 MED ORDER — CEFTRIAXONE SODIUM 1 G IJ SOLR
1.0000 g | Freq: Once | INTRAMUSCULAR | Status: AC
  Administered 2021-09-18: 1 g via INTRAMUSCULAR

## 2021-09-18 NOTE — Patient Instructions (Addendum)
One gram IM Rocephin given in office. Take Zithromax Z-pak 2 tabs day 1 followed by one tab days 2-5.  Rest and drink fluids.  No heavy work outside.  Call if symptoms worsen but otherwise we will see you in 2 weeks for follow-up and repeat chest x-ray.

## 2021-09-18 NOTE — Progress Notes (Signed)
Subjective:    Patient ID: Sonya Franklin, female    DOB: [birth year], 56 y.o.   MRN: [id number]  HPI 56 year old Female diagnosed with RML infiltrate at [facility] on [facility] recently on June 28.  Patient stated at that time that right anterior chest pain had started the previous day and that it hurt to take a deep breath.  It hurt to cough.  Denied fever or sore throat. Also complained of some discomfort with moving right arm.  Chest x-ray at the urgent care showed new right middle lobe infiltrate consistent with pneumonia.  No pleural effusion or pneumothorax.  Remaining lungs were clear.  She was treated with doxycycline 100 mg twice daily for 7 days.  She still does not feel completely back to normal and is here for follow-up.  Has fatigue, generalized weakness, slight shortness of breath and some discomfort in her chest.  Cough has improved.  Denies fever or shaking chills.  She has a history of ankylosing spondylitis diagnosed by Dr. Justine Null, Rheumatologist in the remote past and obtains Depo-Medrol injections from time to time in my office.  History of multinodular goiter and has seen Dr. Dwyane Dee for management but not recently.  Had biopsy of complex thyroid nodule right inferior lobe which showed benign colloid nodule in 2006.  She has mild hypertension treated with amlodipine.  GE reflux treated with Protonix.  History of vitamin D deficiency.  Takes Xanax for anxiety and sometimes takes ibuprofen for musculoskeletal pain.  Patient does her own yard work and without doing some chores feeding animals and some light yard work when she began to feel poorly.  History of right breast cancer in 2007 treated with adjuvant chemotherapy.  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  Has done well since that time.  History of multinodular goiter on thyroid scan in 2010.  History of mild to moderate osteopenia on bone density study in 2012.  Social  history: She is divorced and resides with special needs son.  Non-smoker.  No alcohol consumption.  Receives disability benefits for history of breast cancer and ankylosing spondylitis.  Review of Systems ears felt clogged and improved after taking Claritin and sinus pressure.  Feels somewhat dizzy.  Orthostatic blood pressures were taken in the office today     Objective:   Physical Exam  Supine BP 120/88 left arm with pulse of 71,  Sitting blood pressure is 118/86 left arm with pulse of 75  Standing blood pressure is 126/88 with pulse of 71.  BMI is 33.06 Pulse oximetry is 98% on room air  Left TM is clear but has a small amount of cerumen in the left external ear canal.  Right TM is not seen due to cerumen in external ear canal.  Pharynx is clear.  Neck is supple.  No adenopathy in her neck.  Her chest is basically clear except for rales in her right middle lobe.  Not a lot of coughing today.      Assessment & Plan:  Persistent right middle lobe pneumonia  Plan: It will take some time before chest x-ray clears up.  At this point, we have given her 1 g IM Rocephin and started her on Zithromax Z-PAK.  CBC and c-Met ordered.  She will follow-up in 2 weeks or sooner if worse.  We will do repeat chest x-ray at that time.  She is to rest, stay well-hydrated and not do heavy yard work.  If she has hemoptysis or shortness of breath she is to get in touch with [place] immediately.  She has not had pneumococcal vaccine.  She had 2 COVID vaccines in 2021.

## 2021-09-19 LAB — CBC WITH DIFFERENTIAL/PLATELET
Absolute Monocytes: 592 cells/uL (ref 200–950)
Basophils Absolute: 31 cells/uL (ref 0–200)
Basophils Relative: 0.5 %
Eosinophils Absolute: 79 cells/uL (ref 15–500)
Eosinophils Relative: 1.3 %
HCT: 33.1 % — ABNORMAL LOW (ref 35.0–45.0)
Hemoglobin: 11.2 g/dL — ABNORMAL LOW (ref 11.7–15.5)
Lymphs Abs: 1525 cells/uL (ref 850–3900)
MCH: 29.8 pg (ref 27.0–33.0)
MCHC: 33.8 g/dL (ref 32.0–36.0)
MCV: 88 fL (ref 80.0–100.0)
MPV: 9 fL (ref 7.5–12.5)
Monocytes Relative: 9.7 %
Neutro Abs: 3874 cells/uL (ref 1500–7800)
Neutrophils Relative %: 63.5 %
Platelets: 266 10*3/uL (ref 140–400)
RBC: 3.76 10*6/uL — ABNORMAL LOW (ref 3.80–5.10)
RDW: 13.5 % (ref 11.0–15.0)
Total Lymphocyte: 25 %
WBC: 6.1 10*3/uL (ref 3.8–10.8)

## 2021-09-19 LAB — COMPLETE METABOLIC PANEL WITH GFR
AG Ratio: 1.6 (calc) (ref 1.0–2.5)
ALT: 15 U/L (ref 6–29)
AST: 14 U/L (ref 10–35)
Albumin: 4.2 g/dL (ref 3.6–5.1)
Alkaline phosphatase (APISO): 74 U/L (ref 37–153)
BUN: 12 mg/dL (ref 7–25)
CO2: 23 mmol/L (ref 20–32)
Calcium: 9.6 mg/dL (ref 8.6–10.4)
Chloride: 107 mmol/L (ref 98–110)
Creat: 0.59 mg/dL (ref 0.50–1.03)
Globulin: 2.6 g/dL (calc) (ref 1.9–3.7)
Glucose, Bld: 83 mg/dL (ref 65–99)
Potassium: 4.1 mmol/L (ref 3.5–5.3)
Sodium: 143 mmol/L (ref 135–146)
Total Bilirubin: 0.4 mg/dL (ref 0.2–1.2)
Total Protein: 6.8 g/dL (ref 6.1–8.1)
eGFR: 106 mL/min/{1.73_m2} (ref >=60)

## 2021-09-24 DIAGNOSIS — G4733 Obstructive sleep apnea (adult) (pediatric): Secondary | ICD-10-CM | POA: Diagnosis not present

## 2021-09-26 ENCOUNTER — Telehealth: Payer: Self-pay | Admitting: Internal Medicine

## 2021-09-26 NOTE — Telephone Encounter (Signed)
Dr Thad Ranger Dohmier's office   This message was sent via [facility], a product from [employer] http://www.biscom.com/                    -------Fax Transmission Report-------  To:               Recipient at [phone] Subject:          FW: Hp Scans Result:           The transmission was successful. Explanation:      All Pages Ok Pages Sent:       3 Connect Time:     1 minutes, 40 seconds Transmit Time:    09/26/2021 10:15 Transfer Rate:    9600 Status Code:      0000 Retry Count:      8 Job Id:           [id number] TTSVXB Id:        [id number] Fax Line:         51 Fax Server:       [id number]

## 2021-09-26 NOTE — Telephone Encounter (Signed)
Received orders from [employer] [facility] for CPAP supplies these are done by Dr Thad Ranger Dohmier so I faxed back to [phone] and also faxed to Dr Dohmier's office.

## 2021-09-26 NOTE — Telephone Encounter (Signed)
This message was sent via [employer], a product from [employer] http://www.biscom.com/                    -------Fax Transmission Report-------  To:               Recipient at [phone] Subject:          FW: Hp Scans Result:           The transmission was successful. Explanation:      All Pages Ok Pages Sent:       3 Connect Time:     1 minutes, 19 seconds Transmit Time:    09/26/2021 08:53 Transfer Rate:    14400 Status Code:      0000 Retry Count:      0 Job Id:           [id number] Unique Id:        [id number] Fax Line:         3 Fax Server:       [employer]

## 2021-09-29 ENCOUNTER — Other Ambulatory Visit: Payer: Self-pay

## 2021-10-03 ENCOUNTER — Ambulatory Visit
Admission: RE | Admit: 2021-10-03 | Discharge: 2021-10-03 | Disposition: A | Discharge status: Home / Self Care | Payer: Medicare Other | Source: Ambulatory Visit | Attending: Internal Medicine | Admitting: Internal Medicine

## 2021-10-03 ENCOUNTER — Ambulatory Visit (INDEPENDENT_AMBULATORY_CARE_PROVIDER_SITE_OTHER): Payer: Medicare Other | Admitting: Internal Medicine

## 2021-10-03 ENCOUNTER — Encounter: Payer: Self-pay | Admitting: Internal Medicine

## 2021-10-03 VITALS — BP 132/80 | HR 64 | Temp 97.0°F

## 2021-10-03 DIAGNOSIS — M450 Ankylosing spondylitis of multiple sites in spine: Secondary | ICD-10-CM | POA: Diagnosis not present

## 2021-10-03 DIAGNOSIS — J189 Pneumonia, unspecified organism: Secondary | ICD-10-CM

## 2021-10-03 DIAGNOSIS — W57XXXA Bitten or stung by nonvenomous insect and other nonvenomous arthropods, initial encounter: Secondary | ICD-10-CM

## 2021-10-03 DIAGNOSIS — R059 Cough, unspecified: Secondary | ICD-10-CM | POA: Diagnosis not present

## 2021-10-03 MED ORDER — TRIAMCINOLONE ACETONIDE 0.1 % EX CREA: 1.0000 | TOPICAL_CREAM | Freq: Three times a day (TID) | CUTANEOUS | 1 refills | Status: DC

## 2021-10-03 MED ORDER — METHYLPREDNISOLONE ACETATE 80 MG/ML IJ SUSP
80.0000 mg | Freq: Once | INTRAMUSCULAR | Status: AC
  Administered 2021-10-03: 80 mg via INTRAMUSCULAR

## 2021-10-03 NOTE — Progress Notes (Signed)
   Subjective:    Patient ID: Sonya Franklin, female    DOB: Jun 03, 1965, 56 y.o.   MRN: [id number]  HPI In today to follow-up on right middle lobe pneumonia.  Was seen at urgent care on [facility] June 28.  Was having issues with right anterior chest pain that had started the previous day.  It hurt to take a deep breath and it hurt to cough.  Chest x-ray showed new right middle lobe infiltrate consistent with pneumonia.  She was treated with doxycycline 100 mg twice daily for 7 days and was seen in follow-up on July 6.  Please see detailed note from July 6 regarding her past medical history.  She was noted to have rales in right middle lobe at that time.  Was given 1 g IM Rocephin and started on Zithromax.  CBC and c-Met were ordered and she is now here for follow-up and repeat chest x-ray.  Pneumococcal 20 vaccine discussed with her and she will think about it.  Her chest x-ray is now completely clear.  No evidence of pneumonia.  She is feeling better.  She is having some issues with insect bites and triamcinolone cream will be prescribed to use sparingly up to 3 times daily.  CBC and c-Met done on July 6 were stable.    Review of Systems see above     Objective:   Physical Exam Vital signs reviewed.  She is afebrile. Neck is supple without JVD thyromegaly or carotid bruits.  Chest is completely clear to auscultation.  No lower extremity pitting edema.  No hot, swollen or red joints.      Assessment & Plan:  History of ankylosing spondylitis treated with Depo-Medrol IM from time to time and does well with this treatment.  Right middle lobe pneumonia-resolved per chest x-ray and she is feeling better.  Insect bites-prescribed triamcinolone cream 0.1% as needed up to 3 times daily.  Discussed pneumococcal vaccine and patient will think about it.

## 2021-10-03 NOTE — Progress Notes (Signed)
Follow up pneumonia

## 2021-10-12 NOTE — Patient Instructions (Addendum)
We are pleased to report that your pneumonia has resolved.  Chest x-ray report has been reviewed today.  Depo-Medrol 80 mg IM given as requested for ankylosing spondylitis.  Triamcinolone cream 0.1% prescribed as requested for insect bites.

## 2021-10-13 ENCOUNTER — Telehealth: Payer: Self-pay | Admitting: Internal Medicine

## 2021-10-13 NOTE — Telephone Encounter (Signed)
LVM for Sonya Franklin to call me back so I can see when she had a bee stung or bug bite.

## 2021-10-13 NOTE — Telephone Encounter (Signed)
Sonya Franklin called back she has bug bit on 09/17/2021, right leg

## 2021-10-23 ENCOUNTER — Telehealth: Payer: Self-pay | Admitting: Internal Medicine

## 2021-10-23 NOTE — Telephone Encounter (Signed)
After talking with Dr Renold Genta she said next week was the earliest that she could get a shot, that she could do  Physical Therapy or take pain medicine

## 2021-10-23 NOTE — Telephone Encounter (Signed)
Sonya Franklin [phone]  Tayvia called to say she is having right side sciatica pain, she has been siting in the bath tub with Epson salt. I let her know it was too early for a shot.

## 2021-10-23 NOTE — Telephone Encounter (Signed)
LVM of her options of PT and pain medication.

## 2021-10-24 NOTE — Telephone Encounter (Signed)
LVM to CB and schedule appt, if she still needs it

## 2021-10-26 ENCOUNTER — Emergency Department (HOSPITAL_COMMUNITY)
Admission: EM | Admit: 2021-10-26 | Discharge: 2021-10-26 | Disposition: A | Discharge status: Home / Self Care | Payer: Medicare Other | Attending: Emergency Medicine | Admitting: Emergency Medicine

## 2021-10-26 ENCOUNTER — Encounter (HOSPITAL_COMMUNITY): Payer: Self-pay | Admitting: Emergency Medicine

## 2021-10-26 ENCOUNTER — Encounter (HOSPITAL_COMMUNITY): Payer: Self-pay

## 2021-10-26 ENCOUNTER — Ambulatory Visit (HOSPITAL_COMMUNITY)
Admission: EM | Admit: 2021-10-26 | Discharge: 2021-10-26 | Disposition: A | Discharge status: Home / Self Care | Payer: Medicare Other | Attending: Family Medicine | Admitting: Family Medicine

## 2021-10-26 ENCOUNTER — Emergency Department (HOSPITAL_COMMUNITY): Payer: Medicare Other

## 2021-10-26 DIAGNOSIS — R200 Anesthesia of skin: Secondary | ICD-10-CM | POA: Diagnosis not present

## 2021-10-26 DIAGNOSIS — Z853 Personal history of malignant neoplasm of breast: Secondary | ICD-10-CM | POA: Insufficient documentation

## 2021-10-26 DIAGNOSIS — I10 Essential (primary) hypertension: Secondary | ICD-10-CM | POA: Insufficient documentation

## 2021-10-26 DIAGNOSIS — M5441 Lumbago with sciatica, right side: Secondary | ICD-10-CM | POA: Diagnosis not present

## 2021-10-26 DIAGNOSIS — R202 Paresthesia of skin: Secondary | ICD-10-CM | POA: Diagnosis not present

## 2021-10-26 DIAGNOSIS — M456 Ankylosing spondylitis lumbar region: Secondary | ICD-10-CM | POA: Diagnosis not present

## 2021-10-26 DIAGNOSIS — M545 Low back pain, unspecified: Secondary | ICD-10-CM | POA: Diagnosis present

## 2021-10-26 LAB — URINALYSIS, ROUTINE W REFLEX MICROSCOPIC
Bilirubin Urine: NEGATIVE
Glucose, UA: NEGATIVE mg/dL
Hgb urine dipstick: NEGATIVE
Ketones, ur: 20 mg/dL — AB
Leukocytes,Ua: NEGATIVE
Nitrite: NEGATIVE
Protein, ur: NEGATIVE mg/dL
Specific Gravity, Urine: 1.015 (ref 1.005–1.030)
pH: 5 (ref 5.0–8.0)

## 2021-10-26 MED ORDER — LIDOCAINE 5 % EX PTCH
1.0000 | MEDICATED_PATCH | Freq: Once | CUTANEOUS | Status: DC
  Administered 2021-10-26: 1 via TRANSDERMAL
  Filled 2021-10-26: qty 1

## 2021-10-26 NOTE — ED Notes (Signed)
Pt provided discharge instructions and prescription information. Pt was given the opportunity to ask questions and questions were answered.   

## 2021-10-26 NOTE — Discharge Instructions (Addendum)
A referral for [facility] neurolog[facility] has been placed on your behalf.  You should receive a phone call to schedule within the next 72 hours.  I have also provided the contact information for you as well, you may call them if you have not heard from them by that point.  Please continue to utilize over-the-counter Lidoderm patches, NSAIDs, and tylenol for pain management.  Also follow-up with your PCP within the next few days for reevaluation and continued medical management.  Return to the ED for any new or worsening symptoms as discussed.

## 2021-10-26 NOTE — ED Triage Notes (Signed)
Pt c/o numbness all over her body x 2-3 days. She denies any recent trauma or falls.

## 2021-10-26 NOTE — ED Provider Notes (Signed)
[facility] EMERGENCY DEPARTMENT Provider Note   CSN: [id number] Arrival date & time: 10/26/21  1225     History {Add pertinent medical, surgical, social history, OB history to HPI:1} Chief Complaint  Patient presents with  . Numbness    Sonya Franklin is a 56 y.o. female with Hx of essential HTN, fibromyalgia, palpitations, hypercholesterolemia, prior breast cancer, ankylosing spondylitis, ischemic bowel disease, GERD, anxiety.  Presenting today with numbness in the hands and feet.  Worse of the right leg, no numbness of left leg.  Also with low back pain.  Concerned her sciatica may be causing the numbness, as she's had sciatica for a while however this is the first time she's had the numbness.  Recently recovering from pneumonia of 09/10/2021.  Also was lifting some heavy boxes at a grocery store.  Symptoms started shortly after these events.  Per UC notes also with Hx of peripheral neuropathy, for which she tried Lyrica and Gabapentin once however was unable to tolerate them.  Denies urinary/bowel incontinence, or saddle anesthesia.  No difficulty walking or facial asymmetry.  Denies shortness of breath, chest pain, headache, or vision changes.  The history is provided by the patient and medical records.       Home Medications Prior to Admission medications   Medication Sig Start Date End Date Taking? Authorizing Provider  ALPRAZolam (XANAX) 0.25 MG tablet TAKE 1 TABLET (0.25 MG TOTAL) BY MOUTH 2 (TWO) TIMES DAILY AS NEEDED FOR ANXIETY. 02/22/21   Elby Showers, MD  amLODipine (NORVASC) 5 MG tablet TAKE 1 TABLET (5 MG TOTAL) BY MOUTH DAILY. 05/18/21   Elby Showers, MD  furosemide (LASIX) 20 MG tablet Take 1 tablet (20 mg total) by mouth daily as needed. 08/26/21 11/24/21  Skeet Latch, MD  hyoscyamine (ANASPAZ) 0.125 MG TBDP disintergrating tablet Place 1 tablet (0.125 mg total) under the tongue every 4 (four) hours as needed for cramping. 08/22/21   Elby Showers,  MD  ibuprofen (ADVIL) 800 MG tablet TAKE 1 TABLET BY MOUTH EVERY 8 HOURS AS NEEDED FOR PAIN 09/11/21   Elby Showers, MD  olopatadine (PATANOL) 0.1 % ophthalmic solution Place 1 drop into both eyes 2 (two) times daily as needed. 05/05/21   Elby Showers, MD  pantoprazole (PROTONIX) 40 MG tablet TAKE 1 TABLET BY MOUTH EVERY DAY 11/18/20   Elby Showers, MD  saccharomyces boulardii (FLORASTOR) 250 MG capsule Take 1 capsule (250 mg total) by mouth 2 (two) times daily. Patient taking differently: Take 250 mg by mouth as needed. 07/18/16   Barton Dubois, MD  triamcinolone cream (KENALOG) 0.1 % Apply 1 Application topically 3 (three) times daily. 10/03/21   Elby Showers, MD      Allergies    Shellfish allergy, Blue dyes (parenteral), and Hydrochlorothiazide    Review of Systems   Review of Systems  Neurological:  Positive for numbness.    Physical Exam Updated Vital Signs BP (!) 153/75 (BP Location: Left Arm)   Pulse 66   Temp 98.6 F (37 C) (Oral)   Resp 16   SpO2 100%  Physical Exam Vitals and nursing note reviewed.  Constitutional:      General: She is not in acute distress.    Appearance: She is well-developed.  HENT:     Head: Normocephalic and atraumatic.  Eyes:     Conjunctiva/sclera: Conjunctivae normal.  Cardiovascular:     Rate and Rhythm: Normal rate and regular rhythm.  Heart sounds: Murmur heard.  Pulmonary:     Effort: Pulmonary effort is normal. No respiratory distress.     Breath sounds: Normal breath sounds.  Abdominal:     Palpations: Abdomen is soft.     Tenderness: There is no abdominal tenderness.  Musculoskeletal:        General: No swelling.     Cervical back: Neck supple.  Skin:    General: Skin is warm and dry.     Capillary Refill: Capillary refill takes less than 2 seconds.  Neurological:     Mental Status: She is alert and oriented to person, place, and time.     Sensory: Sensory deficit (Subjective) present.     Coordination: Coordination  normal.     Gait: Gait normal.  Psychiatric:        Mood and Affect: Mood normal.    ED Results / Procedures / Treatments   Labs (all labs ordered are listed, but only abnormal results are displayed) Labs Reviewed - No data to display  EKG None  Radiology No results found.  Procedures Procedures  {Document cardiac monitor, telemetry assessment procedure when appropriate:1}  Medications Ordered in ED Medications - No data to display  ED Course/ Medical Decision Making/ A&P                           Medical Decision Making Amount and/or Complexity of Data Reviewed Radiology: ordered.   ***  {Document critical care time when appropriate:1} {Document review of labs and clinical decision tools ie heart score, Chads2Vasc2 etc:1}  {Document your independent review of radiology images, and any outside records:1} {Document your discussion with family members, caretakers, and with consultants:1} {Document social determinants of health affecting pt's care:1} {Document your decision making why or why not admission, treatments were needed:1} Final Clinical Impression(s) / ED Diagnoses Final diagnoses:  None    Rx / DC Orders ED Discharge Orders     None

## 2021-10-26 NOTE — Discharge Instructions (Addendum)
Please proceed to the emergency room for higher level of care and evaluation then we can provide here in the urgent care

## 2021-10-26 NOTE — ED Provider Notes (Signed)
[facility]    CSN: [id number] Arrival date & time: 10/26/21  1040      History   Chief Complaint Chief Complaint  Patient presents with   Numbness    HPI Sonya Franklin is a 56 y.o. female.   HPI Here with numbness and tingling in her hands and feet.  She states she is concerned it is her sciatica.  She has a long history of neuropathy, and has not tolerated gabapentin and Lyrica in the past.  She does have a history of ankylosing spondylitis and has pain "everywhere".  In the last 5 days she has had more intense tingling and numbness in both hands and both feet, but it is more noticeable in the right side.  No headache.  Blood pressure is mildly elevated here at 149/80    Past Medical History:  Diagnosis Date   Ankylosing spondylitis (HCC)    Anxiety    Arthritis    Breast cancer ([place])    right breast   Bursitis of hip, right    Cardiac arrhythmia    Carotid stenosis 08/26/2021   GE reflux    Hiatal hernia    Hypertension    Hyperthyroidism    Ischemic bowel disease ([place])    Prediabetes 08/26/2021   Snoring 03/21/2014   Vitamin D deficiency     Patient Active Problem List   Diagnosis Date Noted   Carotid stenosis 08/26/2021   Prediabetes 08/26/2021   Impaired glucose tolerance 09/11/2017   Hypokalemia 07/17/2016   Hyperglycemia 07/17/2016   Obesity 07/17/2016   Snoring 03/21/2014   Ankylosing spondylitis ([place]) 10/15/2013   Abdominal pain 03/19/2012   Personal history of goiter 03/19/2012   Shellfish allergy 06/13/2011   History of breast cancer 06/13/2011   Abnormal TSH 05/19/2011   Palpitations 05/19/2011   Fatigue 05/19/2011   Onychomycosis 05/19/2011   Benign essential HTN 09/02/2010   Fibromyalgia 09/02/2010   Vitamin D deficiency 09/02/2010    Past Surgical History:  Procedure Laterality Date   ABDOMINAL HYSTERECTOMY     BREAST LUMPECTOMY     right   MASTECTOMY     double    OB History   No obstetric history on file.       Home Medications    Prior to Admission medications   Medication Sig Start Date End Date Taking? Authorizing Provider  ALPRAZolam (XANAX) 0.25 MG tablet TAKE 1 TABLET (0.25 MG TOTAL) BY MOUTH 2 (TWO) TIMES DAILY AS NEEDED FOR ANXIETY. 02/22/21   Elby Showers, MD  amLODipine (NORVASC) 5 MG tablet TAKE 1 TABLET (5 MG TOTAL) BY MOUTH DAILY. 05/18/21   Elby Showers, MD  furosemide (LASIX) 20 MG tablet Take 1 tablet (20 mg total) by mouth daily as needed. 08/26/21 11/24/21  Skeet Latch, MD  hyoscyamine (ANASPAZ) 0.125 MG TBDP disintergrating tablet Place 1 tablet (0.125 mg total) under the tongue every 4 (four) hours as needed for cramping. 08/22/21   Elby Showers, MD  ibuprofen (ADVIL) 800 MG tablet TAKE 1 TABLET BY MOUTH EVERY 8 HOURS AS NEEDED FOR PAIN 09/11/21   Elby Showers, MD  olopatadine (PATANOL) 0.1 % ophthalmic solution Place 1 drop into both eyes 2 (two) times daily as needed. 05/05/21   Elby Showers, MD  pantoprazole (PROTONIX) 40 MG tablet TAKE 1 TABLET BY MOUTH EVERY DAY 11/18/20   Elby Showers, MD  saccharomyces boulardii (FLORASTOR) 250 MG capsule Take 1 capsule (250 mg total) by mouth 2 (  two) times daily. Patient taking differently: Take 250 mg by mouth as needed. 07/18/16   Barton Dubois, MD  triamcinolone cream (KENALOG) 0.1 % Apply 1 Application topically 3 (three) times daily. 10/03/21   Elby Showers, MD    Family History Family History  Problem Relation Age of Onset   Breast cancer Maternal Aunt    Colitis Brother    Diabetes Mother    Hypertension Mother    Hyperlipidemia Mother    Goiter Maternal Grandmother     Social History Social History   Tobacco Use   Smoking status: Never   Smokeless tobacco: Never  Vaping Use   Vaping Use: Never used  Substance Use Topics   Alcohol use: No   Drug use: No     Allergies   Shellfish allergy, Blue dyes (parenteral), and Hydrochlorothiazide   Review of Systems Review of Systems   Physical  Exam Triage Vital Signs ED Triage Vitals [10/26/21 1148]  Enc Vitals Group     BP (!) 149/80     Pulse Rate 74     Resp 18     Temp 98.4 F (36.9 C)     Temp Source Oral     SpO2 97 %     Weight      Height      Head Circumference      Peak Flow      Pain Score      Pain Loc      Pain Edu?      Excl. in [place]?    No data found.  Updated Vital Signs BP (!) 149/80 (BP Location: Left Arm)   Pulse 74   Temp 98.4 F (36.9 C) (Oral)   Resp 18   SpO2 97%   Visual Acuity Right Eye Distance:   Left Eye Distance:   Bilateral Distance:    Right Eye Near:   Left Eye Near:    Bilateral Near:     Physical Exam Vitals reviewed.  Constitutional:      General: She is not in acute distress.    Appearance: She is not ill-appearing, toxic-appearing or diaphoretic.  HENT:     Mouth/Throat:     Mouth: Mucous membranes are moist.     Pharynx: No oropharyngeal exudate or posterior oropharyngeal erythema.  Eyes:     Extraocular Movements: Extraocular movements intact.     Conjunctiva/sclera: Conjunctivae normal.     Pupils: Pupils are equal, round, and reactive to light.  Cardiovascular:     Rate and Rhythm: Normal rate and regular rhythm.     Heart sounds: No murmur heard. Pulmonary:     Effort: Pulmonary effort is normal.     Breath sounds: Normal breath sounds.  Musculoskeletal:     Cervical back: Neck supple.  Lymphadenopathy:     Cervical: No cervical adenopathy.  Neurological:     Mental Status: She is alert and oriented to person, place, and time.     Comments: There is decreased to light touch sensation on her hands and feet bilaterally.  Psychiatric:        Behavior: Behavior normal.      UC Treatments / Results  Labs (all labs ordered are listed, but only abnormal results are displayed) Labs Reviewed - No data to display  EKG   Radiology No results found.  Procedures Procedures (including critical care time)  Medications Ordered in UC Medications -  No data to display  Initial Impression / Assessment and Plan /  UC Course  I have reviewed the triage vital signs and the nursing notes.  Pertinent labs & imaging results that were available during my care of the patient were reviewed by me and considered in my medical decision making (see chart for details).     I have discussed with her what kind of evaluation we can do here in the urgent care for her.  She wishes to have scans done to make sure its not a stroke, or or her sciatica causing this.  Discussed with her that is unlikely for sciatica to be causing numbness and tingling in her hands.  She is going to proceed to the emergency room for further evaluation Final Clinical Impressions(s) / UC Diagnoses   Final diagnoses:  Paresthesia     Discharge Instructions      Please proceed to the emergency room for higher level of care and evaluation then we can provide here in the urgent care    ED Prescriptions   None    PDMP not reviewed this encounter.   Barrett Henle, MD 10/26/21 [phone]

## 2021-10-26 NOTE — ED Triage Notes (Signed)
Patient here with of bilateral hand and feet numbness that started last Tuesday. Patient is alert, oriented, ambulatory, and in no apparent distress at this time.

## 2021-10-27 NOTE — Telephone Encounter (Signed)
LVM calling to check on her to see how she is feeling.

## 2021-10-30 ENCOUNTER — Encounter: Payer: Self-pay | Admitting: Internal Medicine

## 2021-10-30 ENCOUNTER — Ambulatory Visit (INDEPENDENT_AMBULATORY_CARE_PROVIDER_SITE_OTHER): Payer: Medicare Other | Admitting: Internal Medicine

## 2021-10-30 VITALS — BP 120/80 | HR 71 | Temp 97.5°F | Wt 179.8 lb

## 2021-10-30 DIAGNOSIS — G8929 Other chronic pain: Secondary | ICD-10-CM | POA: Diagnosis not present

## 2021-10-30 DIAGNOSIS — G629 Polyneuropathy, unspecified: Secondary | ICD-10-CM

## 2021-10-30 DIAGNOSIS — M542 Cervicalgia: Secondary | ICD-10-CM | POA: Diagnosis not present

## 2021-10-30 DIAGNOSIS — R5383 Other fatigue: Secondary | ICD-10-CM | POA: Diagnosis not present

## 2021-10-30 DIAGNOSIS — M5431 Sciatica, right side: Secondary | ICD-10-CM | POA: Diagnosis not present

## 2021-10-30 MED ORDER — METHYLPREDNISOLONE ACETATE 80 MG/ML IJ SUSP
80.0000 mg | Freq: Once | INTRAMUSCULAR | Status: AC
  Administered 2021-10-30: 80 mg via INTRAMUSCULAR

## 2021-10-30 NOTE — Progress Notes (Signed)
 Subjective:    Patient ID: Sonya Franklin, female    DOB: 08/10/1965, 56 y.o.   MRN: [id number]  HPI 56 year old Female seen in ED recently with right sided sciatica. Tried Lidocaine patch at the hospital and it did not help.  Had LS-spine films in the emergency department but no MRI.  These films showed anteriolisthesis L5 on S1 mildly progressive.  Degenerative changes of facet joints L5-S1.  We offered her physical therapy but she declines.  She generally responds well to Depo-Medrol injections.  Does not want to take oral steroids.  She is intolerant to Lyrica and gabapentin for peripheral neuropathy.  Thought to have ankylosing spondylitis per previous rheumatologist years ago Dr. Rowe  Also remote history of breast cancer. Recently seen and treated for pneumonia.  Chest x-ray in late June showed right middle lobe infiltrate consistent with pneumonia.  She was treated with antibiotics and repeat x-ray July 21 showed resolution of chest x-ray.  We had discussed referring her to Rheumatology regarding her chronic back issues.  She had numerous rheumatology studies done today including normal total CK, c-Met, negative rheumatoid factor, negative ANA, Sed rate of 31.  CCP was ordered but for some reason has not been resulted.  Vitamin B12 was low normal at 321.  Free T4 was normal CBC with differential showed hemoglobin stable at 11.4 g.   She has hypertension and remote history of breast cancer.  She is unemployed and receives disability benefits.  History of multinodular goiter and has seen Dr. Kumar in the past.  She had biopsy 2006 for complex thyroid nodule right inferior lobe which showed benign colloid nodule.  History of mild hypertension treated with amlodipine.  Takes Protonix for GE reflux.  History of vitamin D deficiency.  Takes Xanax for anxiety.  Right breast cancer in 2007 and had adjuvant chemotherapy.  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  Has  done well since that time but was not able to return to work after treatment.  Had right lumpectomy and sentinel node biopsy May 2007 for T2N0 grade 2 invasive ductal carcinoma.  Was treated with Taxol, doxorubicin and cyclophosphamide applied.  Had bilateral mastectomies November 2007 with implant reconstruction.  She took Femara briefly in 2012 but declined to continue it.  Prior to that she took tamoxifen from January 2008 until June 2011.  History of mild to moderate osteopenia on bone density study in 2012.  Had thyroid scan in 2010 consistent with multinodular goiter.  Family history of breast cancer in her mother at age 52 as well as maternal aunt.  Social history: She is divorced.  Non-smoker.  No alcohol consumption and resides with her special needs son.    Review of Systems intolerant to statins- cause heaviness in legs.Has chronic numbness in hands and chronic low back pain thought by Dr. Rowe to be ankylosing spondylitis in the remote past. She receives Depomedrol injections from time to time.     Objective:   Physical Exam  Straight leg raising is negative at 90 degrees.  Muscle strength is normal in the lower extremities and sensation is intact in the lower extremities.      Assessment & Plan:   Pure hypercholesterolemia-does not want to be on statin due to musculoskeletal issues  History of ankylosing spondylitis receiving intermittent Depo-Medrol injections.  This was diagnosed by Dr. Tom Rowe, rheumatologist.  She continues to have chronic issues with musculoskeletal pain.  Labs drawn today with   results above.  Only test not back is CCP and may need to be repeated as we do not have a result now.  May benefit from referral to rheumatology.  Recent pneumonia-resolved  I think we should consider rheumatology referral if her issues persist.   

## 2021-11-03 ENCOUNTER — Encounter: Payer: Self-pay | Admitting: Neurology

## 2021-11-05 ENCOUNTER — Encounter: Payer: Self-pay | Admitting: Internal Medicine

## 2021-11-05 ENCOUNTER — Ambulatory Visit (INDEPENDENT_AMBULATORY_CARE_PROVIDER_SITE_OTHER): Payer: Medicare Other | Admitting: Internal Medicine

## 2021-11-05 ENCOUNTER — Telehealth: Payer: Self-pay | Admitting: Internal Medicine

## 2021-11-05 VITALS — BP 120/84 | HR 95 | Temp 99.0°F

## 2021-11-05 DIAGNOSIS — I10 Essential (primary) hypertension: Secondary | ICD-10-CM | POA: Diagnosis not present

## 2021-11-05 DIAGNOSIS — Z853 Personal history of malignant neoplasm of breast: Secondary | ICD-10-CM | POA: Diagnosis not present

## 2021-11-05 DIAGNOSIS — J069 Acute upper respiratory infection, unspecified: Secondary | ICD-10-CM

## 2021-11-05 DIAGNOSIS — M457 Ankylosing spondylitis of lumbosacral region: Secondary | ICD-10-CM | POA: Diagnosis not present

## 2021-11-05 LAB — ANA: Anti Nuclear Antibody (ANA): NEGATIVE

## 2021-11-05 LAB — COMPREHENSIVE METABOLIC PANEL
AG Ratio: 1.9 (calc) (ref 1.0–2.5)
ALT: 15 U/L (ref 6–29)
AST: 13 U/L (ref 10–35)
Albumin: 4.8 g/dL (ref 3.6–5.1)
Alkaline phosphatase (APISO): 91 U/L (ref 37–153)
BUN: 13 mg/dL (ref 7–25)
CO2: 26 mmol/L (ref 20–32)
Calcium: 9.8 mg/dL (ref 8.6–10.4)
Chloride: 105 mmol/L (ref 98–110)
Creat: 0.63 mg/dL (ref 0.50–1.03)
Globulin: 2.5 g/dL (calc) (ref 1.9–3.7)
Glucose, Bld: 81 mg/dL (ref 65–139)
Potassium: 4.2 mmol/L (ref 3.5–5.3)
Sodium: 141 mmol/L (ref 135–146)
Total Bilirubin: 0.5 mg/dL (ref 0.2–1.2)
Total Protein: 7.3 g/dL (ref 6.1–8.1)

## 2021-11-05 LAB — CBC WITH DIFFERENTIAL/PLATELET
Absolute Monocytes: 439 cells/uL (ref 200–950)
Basophils Absolute: 40 cells/uL (ref 0–200)
Basophils Relative: 0.7 %
Eosinophils Absolute: 171 cells/uL (ref 15–500)
Eosinophils Relative: 3 %
HCT: 34.6 % — ABNORMAL LOW (ref 35.0–45.0)
Hemoglobin: 11.4 g/dL — ABNORMAL LOW (ref 11.7–15.5)
Lymphs Abs: 1362 cells/uL (ref 850–3900)
MCH: 29.2 pg (ref 27.0–33.0)
MCHC: 32.9 g/dL (ref 32.0–36.0)
MCV: 88.5 fL (ref 80.0–100.0)
MPV: 8.8 fL (ref 7.5–12.5)
Monocytes Relative: 7.7 %
Neutro Abs: 3688 cells/uL (ref 1500–7800)
Neutrophils Relative %: 64.7 %
Platelets: 278 10*3/uL (ref 140–400)
RBC: 3.91 10*6/uL (ref 3.80–5.10)
RDW: 13.4 % (ref 11.0–15.0)
Total Lymphocyte: 23.9 %
WBC: 5.7 10*3/uL (ref 3.8–10.8)

## 2021-11-05 LAB — B12 AND FOLATE PANEL
Folate: 14.7 ng/mL
Vitamin B-12: 321 pg/mL (ref 200–1100)

## 2021-11-05 LAB — RHEUMATOID FACTOR: Rheumatoid fact SerPl-aCnc: <14 IU/mL (ref <14)

## 2021-11-05 LAB — CYCLIC CITRUL PEPTIDE ANTIBODY, IGG: Cyclic Citrullin Peptide Ab: <16 UNITS

## 2021-11-05 LAB — CK, TOTAL(REFL): Total CK: 86 U/L (ref 29–143)

## 2021-11-05 LAB — T4, FREE: Free T4: 1.1 ng/dL (ref 0.8–1.8)

## 2021-11-05 LAB — SEDIMENTATION RATE: Sed Rate: 31 mm/h — ABNORMAL HIGH (ref 0–30)

## 2021-11-05 MED ORDER — AZITHROMYCIN 250 MG PO TABS: ORAL_TABLET | ORAL | 0 refills | Status: AC

## 2021-11-05 NOTE — Telephone Encounter (Signed)
Sonya Franklin [phone]  [name] called to say her Ears hurt and draining, post nasal drip, body aches, drainage, feels terrible, no fever. After speaking with Dr Renold Genta I scheduled her for same day appointment.

## 2021-11-05 NOTE — Progress Notes (Signed)
   Subjective:    Patient ID: Sonya Franklin, female    DOB: 1966/02/21, 56 y.o.   MRN: [id number]  HPI 56 year old Female seen today acutely with of ears hurting, postnasal drip, myalgias, no fever, malaise and fatigue.  Does not recall any exposure to anyone that was ill recently.  She was here August 17 with right-sided sciatica after presenting to the emergency department with similar complaints.  Does have anterior listhesis L5 on S1 that is mildly progressive.  Degenerative changes of facet joints L5-S1.  Physical therapy was offered but she declined.  She was given Depo-Medrol IM at that time.  She is intolerant to Lyrica and gabapentin which she has previously been tried for peripheral neuropathy.  Does not want to take oral steroids.  Felt to have ankylosing spondylitis when seen previously by Dr. Kristen Loader, rheumatologist years ago.  Also has remote history of breast cancer.  Had pneumonia in late June with right middle lobe infiltrate.  Was treated with antibiotics and repeat chest x-ray July 21 showed no evidence of pneumonia.  She has chronic back pain.  She had numerous rheumatology studies done on August 17.  Had sed rate of 31.  CCP was not resulted for some reason.  Vitamin B12 was low normal at 321.  Recommended over-the-counter supplementation.  She has hypertension.  This is treated with amlodipine.  Takes as needed Lasix for dependent edema.  History of anxiety treated with Xanax as needed    Review of Systems see above-patient is worried about her symptoms today.  Looks fatigued.     Objective:   Physical Exam   T 99 degrees, BP 120/84 pulse 95, T 99 degrees pulse ox 96% Skin warm and dry.  No cervical adenopathy.  Chest is clear to auscultation without rales or wheezing.  TMs appear to be clear bilaterally.     Assessment & Plan:  Acute otalgia  Acute URI  Possible ankylosing spondylitis with myalgias/arthralgias.  Recommend referral to Rheumatologist sometimes gets  Depo-Medrol injections but this was not given today.  Plan: Zithromax Z-PAK 2 tabs day 1 followed by 1 tab days 2 through 5.

## 2021-11-05 NOTE — Patient Instructions (Addendum)
Take Zithromax Z-PAK 2 tabs day 1 followed by 1 tab days 2 through 5.  Rest and drink plenty of fluids.  Referral to Rheumatology to assess for possible ankylosing spondylitis with diffuse musculoskeletal pain.  Was diagnosed by Dr. Justine Null with ankylosing spondylitis in the remote past.  Blood pressure stable on current regimen.

## 2021-11-05 NOTE — Patient Instructions (Addendum)
Given Depo-Medrol 80 mg IM in office today instead of oral steroids.  Multiple rheumatology labs drawn today.  May benefit from rheumatology consultation.

## 2021-11-07 ENCOUNTER — Telehealth: Payer: Self-pay | Admitting: Internal Medicine

## 2021-11-07 NOTE — Telephone Encounter (Signed)
Sonya Franklin [phone]  [name] called to say she had woke up this morning with a really bad sore throat. She can not hardly swallow. I let her know Dr Renold Genta is out of office until Monday. She said she would go to Urgent Care.

## 2021-12-10 ENCOUNTER — Other Ambulatory Visit: Payer: Self-pay | Admitting: Internal Medicine

## 2021-12-19 ENCOUNTER — Telehealth: Payer: Self-pay | Admitting: Internal Medicine

## 2021-12-19 ENCOUNTER — Encounter: Payer: Self-pay | Admitting: Internal Medicine

## 2021-12-19 MED ORDER — AZITHROMYCIN 250 MG PO TABS: ORAL_TABLET | ORAL | 0 refills | Status: AC

## 2021-12-19 NOTE — Telephone Encounter (Signed)
Sonya Franklin [phone]  Sonya Franklin called to say she was getting sick again. She was afraid she might be getting pneumonia again. I let her know you were out of office until Tuesday and she said she could wait until then. I let her know if she thought she as getting PNA again she should go to urgent care or do a video visit and not wait. She will call back on Tuesday to let us know how she is doing.

## 2021-12-19 NOTE — Telephone Encounter (Signed)
Having recurrent respiratory infection symptoms. I am out of the office today. She generally responds well to z-pak so will send in Z-pak to pharmacy. MJB, MD

## 2021-12-24 ENCOUNTER — Encounter: Payer: Self-pay | Admitting: Internal Medicine

## 2021-12-24 ENCOUNTER — Ambulatory Visit (INDEPENDENT_AMBULATORY_CARE_PROVIDER_SITE_OTHER): Payer: Medicare Other | Admitting: Internal Medicine

## 2021-12-24 VITALS — BP 134/84 | HR 76 | Temp 97.6°F | Ht 62.0 in | Wt 178.8 lb

## 2021-12-24 DIAGNOSIS — Z853 Personal history of malignant neoplasm of breast: Secondary | ICD-10-CM | POA: Diagnosis not present

## 2021-12-24 DIAGNOSIS — M79601 Pain in right arm: Secondary | ICD-10-CM

## 2021-12-24 DIAGNOSIS — I10 Essential (primary) hypertension: Secondary | ICD-10-CM

## 2021-12-24 DIAGNOSIS — E7800 Pure hypercholesterolemia, unspecified: Secondary | ICD-10-CM

## 2021-12-24 DIAGNOSIS — M457 Ankylosing spondylitis of lumbosacral region: Secondary | ICD-10-CM

## 2021-12-24 DIAGNOSIS — F411 Generalized anxiety disorder: Secondary | ICD-10-CM

## 2021-12-24 DIAGNOSIS — G629 Polyneuropathy, unspecified: Secondary | ICD-10-CM | POA: Diagnosis not present

## 2021-12-24 DIAGNOSIS — M5431 Sciatica, right side: Secondary | ICD-10-CM

## 2021-12-24 MED ORDER — OLOPATADINE HCL 0.1 % OP SOLN: 1.0000 [drp] | Freq: Two times a day (BID) | OPHTHALMIC | 99 refills | Status: AC | PRN

## 2021-12-24 NOTE — Telephone Encounter (Signed)
Sonya Franklin called wanting to see if she could get a xray, she says that on her right side when she lifts her arm it feels like her implant is crumbled up. It just does not feel right when she lays down  or lifts her arm.

## 2021-12-24 NOTE — Progress Notes (Signed)
   Subjective:    Patient ID: Sonya Franklin, female    DOB: 08-30-1965, 56 y.o.   MRN: [id number]  HPI 56 year old Female seeing Rheumatologist soon for hx of ankylosing spondylitis diagnosed by Dr. Kristen Loader, Rheumatologist years ago and currently treated with intermittent Depomedrol injections which work well for her.Intolerant of Lyrica and gabapentin.  She had pneumonia RML in late June treated with Doxycycline and resolved.  Hx right breast cancer 2007 and had adjuvant chemotherapy.See note October 30, 2021.  Hx of multinodular goiter.Hx  of mild to moderate osteopenia.  Seen in ED August 13th with right sided sciatica. Had progression of anterolisthesis L5-S1 compared to 2018 when had MRI LS spine. Had sed rate of 13 November 2021. Complained of numbness in hands and feet. See extensive note from [facility] here August 17th.  Has right breast implant and is worried that it does not feel normal. Discomfort when she raises right arm. She was treated with a Z-pak October 6th as she had recurrent respiratory symptoms.   Hx HTN treated with amlodipine. Review of Systems see above     Objective:   Physical Exam   VS reviewed. I do not think she has a mass in axilla on the right. I think her right breast implant is OK. She has some palpable right arm pain but has been doing some outside yard activities. Has been lifting some items and doing some yard work. Her muscle strength os WNL.      Assessment & Plan:   Hx of breast cancer- worried about recurrence  Right arm pain- I think this is musculoskeletal in nature  Ankylosing spondylitis- to be seen soon by Rheumatology  Anxiety  Recent URI on Oct 6th treated with Z-pak  HTN -stable on amlodipine  GERD treated with PPI  Plan: watchful waiting and look forward to input from Rheumatology.

## 2021-12-24 NOTE — Patient Instructions (Addendum)
Conservative treatment for right arm pain. Your chest is clear. I think your implant is OK. Keep appt with Rheumatology.Refill Patanol eye drops as requested.

## 2021-12-24 NOTE — Telephone Encounter (Signed)
Schedule same day appointment

## 2021-12-29 DIAGNOSIS — G4733 Obstructive sleep apnea (adult) (pediatric): Secondary | ICD-10-CM | POA: Diagnosis not present

## 2022-01-05 ENCOUNTER — Ambulatory Visit
Admission: RE | Admit: 2022-01-05 | Discharge: 2022-01-05 | Disposition: A | Discharge status: Home / Self Care | Payer: Medicare Other | Source: Ambulatory Visit | Attending: Internal Medicine | Admitting: Internal Medicine

## 2022-01-05 ENCOUNTER — Encounter: Payer: Self-pay | Admitting: Internal Medicine

## 2022-01-05 ENCOUNTER — Ambulatory Visit (INDEPENDENT_AMBULATORY_CARE_PROVIDER_SITE_OTHER): Payer: Medicare Other | Admitting: Internal Medicine

## 2022-01-05 VITALS — BP 124/68 | HR 93 | Temp 98.2°F | Ht 62.0 in | Wt 181.4 lb

## 2022-01-05 DIAGNOSIS — R079 Chest pain, unspecified: Secondary | ICD-10-CM | POA: Diagnosis not present

## 2022-01-05 DIAGNOSIS — Z853 Personal history of malignant neoplasm of breast: Secondary | ICD-10-CM

## 2022-01-05 DIAGNOSIS — R918 Other nonspecific abnormal finding of lung field: Secondary | ICD-10-CM

## 2022-01-05 MED ORDER — AZITHROMYCIN 250 MG PO TABS: ORAL_TABLET | ORAL | 0 refills | Status: AC

## 2022-01-05 NOTE — Addendum Note (Signed)
Addended by: Elby Showers on: 01/05/2022 04:34 PM   Modules accepted: Orders

## 2022-01-05 NOTE — Patient Instructions (Addendum)
Chest ray ordered for left anterior chest wall pain.  Cannot give injection of Depo-Medrol today as it may interfere with upcoming evaluations with Rheumatology and Neurology.  Offered pain medication but she declines.  She is worried about pneumonia and chest x-ray should rule that out.  Addendum: CXR may represent pneumonia. Ordering CT of chest and z-pak.

## 2022-01-05 NOTE — Progress Notes (Addendum)
   Subjective:    Patient ID: Sonya Franklin, female    DOB: 12-15-1965, 56 y.o.   MRN: [id number]  HPI  Patient had right middle lobe pneumonia. Hx of breast cancer in remote past. She has appt with Neurology this week. Hx of low back pain with right sided sciatica seen in ED August 2013.  Had films in the emergency department of LS spine but no MRI.  Films showed anterior listhesis L5 on S1 mildly progressive.  Degenerative changes of facet joints L5-S1.  She was offered physical therapy but declined.  She generally responds well to Depo-Medrol injections but is gotten several on a monthly basis over the past few months.  Intolerant to Lyrica and gabapentin for peripheral neuropathy.  Thought to have ankylosing spondylitis per previous rheumatologist years ago, Dr. Reece Levy.  Also remote history of breast cancer.  In June she was found to have right middle lobe infiltrate consistent with pneumonia.  She was treated with antibiotics and repeat x-ray July 21 showed resolution of chest x-ray.  She is being referred to rheumatology and has appointment later this week regarding musculoskeletal pain/chronic back pain.  She had numerous rheumatology studies done in August including normal total CK, c-Met, negative rheumatoid factor, negative ANA.  Her sed rate was 31.  CCP was ordered and was less than 16.  We have discussed physical therapy previously but she has not really been all that motivated to go.  Today she wants to make sure she does not have pneumonia again because she is having left anterior chest pain with palpable pain in the left anterior chest.  Has no URI symptoms, fever, chills, sputum production, cough.    Review of Systems see above-Labs drawn in August included total CK, c-Met, CCP, rheumatoid factor, ANA.  Sed rate was 31.  Total CK was 86.  B12 and folate were normal, free T4 was normal, CBC was normal with the exception of low hemoglobin 11.4 g but she never runs a very high  hemoglobin.     Objective:   Physical Exam Vital signs reviewed.  She is afebrile.  She is tender in her left anterior chest to deep palpation midline.  Her chest is completely clear to auscultation without rales or wheezing.  Cardiac exam: Regular rate and rhythm 2/6 systolic ejection murmur.  No pitting edema of the lower extremities.  She is in no acute distress but complaining bitterly of pain.  Has had extensive rheumatology testing in August including total CK, c-Met, CCP, rheumatoid factor, ANA, sed rate, B12, folate, free T4, CBC with differential     Assessment & Plan:   Left anterior chest wall pain-rule out occult pneumonia.  Chest x-ray ordered. Consider Chest CT.  Seeing  Rheumatologist regarding musculoskeletal pain,?  Ankylosing spondylitis this coming Wednesday.Dr. Justine Null thought this was possible several years ago. ?fibromyalgia syndrome?  Appointment to see Neurologist later this week regarding sciatica and paresthesias.  She has not had MRI of the brain- could Multiple sclerosis be a possibility?  Anxiety- is afraid cancer is coming back   Plan: Cannot give her injection of Depomedrol today as it may interfere with upcoming evaluations. Offered pain med but she declines. CXR ordered.  Addendum: CXR shows bilateral heterogeneous opacities may representing atelectasis or infection. Have ordered CT of chest with contrast. Seeing Rheumatologist later this week.

## 2022-01-06 NOTE — Progress Notes (Unsigned)
Initial neurology clinic note  SERVICE DATE: 01/08/22  Reason for Evaluation: Consultation requested by Sonya Rome, PA* for an opinion regarding numbness and tingling in hands and feet. My final recommendations will be communicated back to the requesting physician by way of shared medical record or letter to requesting physician via [place] mail.  HPI: This is [name], a 56 y.o. right-handed female with a medical history of ankylosing spondylitis, sciatica, carotid stenosis, OA, breast cancer (surgery and chemo), HTN, HLD, ischemic bowel disease, hyperthyroidism, pre-diabetes, vit D deficiency, fibromyalgia, anxiety who presents to neurology clinic with the chief complaint of numbness and tingling in feet and hands. The patient is alone today.  Patient's pain started in 1989. She had pain all over. She was sent to rheumatology. She was told she had ankylosing spondylitis. She took NSAIDs and got through the pain. She then got breast cancer around 2006 or 2007. She had surgery and chemo. Per patient her chemo was called "red devil" (likely doxorubicin). Per oncology note from 04/13/2011: A 56 year old Sonya Franklin woman status post right lumpectomy and sentinel lymph node biopsy in May 2007 for a T2 N0, grade 2 invasive ductal carcinoma.  Tumor was strongly ER/PR positive, HER2/neu negative by FISH with MIB-1 of 30%.  Status post adjuvant chemotherapy with 4 cycles of dose-dense doxorubicin/ cyclophosphamide, followed by weekly paclitaxel x4.  Status post bilateral mastectomies in November 2007 with implant reconstruction and no evidence of residual disease.  On tamoxifen from January 2008 until June 2011. Took letrozole briefly in 2012, discontinuing it at her own initiative..  Also with a history of vitamin D deficiency and hypertension.   She developed numbness and tingling in her hands and feet after chemotherapy (maybe a year after?). She reports that the numbness and tingling  started in the hands. The numbness comes and goes.   She was afraid she was having a stroke recently. Patient presented to urgent care (then transferred to ED) on 10/26/21 for numbness and tingling in hands and feet. Per documentation, she has a long history of neuropathy and not been able to tolerate gabapentin or Lyrica. It is worse in the right leg. There is also associated back pain and right sided sciatica. It will flair up a couple of times per year.   She was recently seen by rheumatology, who per patient report, wants to start Humira.  She does not use assistive devices to walk and denies falls.  She does not report any constitutional symptoms like fever, night sweats, anorexia or unintentional weight loss.  EtOH use: None  Restrictive diet? No red meat. Eats chicken and fish Family history of neuropathy/myopathy/NM disease? None  Patient has never had an EMG.  MEDICATIONS:  Outpatient Encounter Medications as of 01/08/2022  Medication Sig   ALPRAZolam (XANAX) 0.25 MG tablet TAKE 1 TABLET (0.25 MG TOTAL) BY MOUTH 2 (TWO) TIMES DAILY AS NEEDED FOR ANXIETY.   amLODipine (NORVASC) 5 MG tablet TAKE 1 TABLET (5 MG TOTAL) BY MOUTH DAILY.   azithromycin (ZITHROMAX) 250 MG tablet Take 2 tablets on day 1, then 1 tablet daily on days 2 through 5   furosemide (LASIX) 20 MG tablet Take 1 tablet (20 mg total) by mouth daily as needed.   hyoscyamine (ANASPAZ) 0.125 MG TBDP disintergrating tablet Place 1 tablet (0.125 mg total) under the tongue every 4 (four) hours as needed for cramping.   ibuprofen (ADVIL) 800 MG tablet TAKE 1 TABLET BY MOUTH EVERY 8 HOURS AS NEEDED FOR  PAIN   olopatadine (PATANOL) 0.1 % ophthalmic solution Place 1 drop into both eyes 2 (two) times daily as needed.   pantoprazole (PROTONIX) 40 MG tablet TAKE 1 TABLET BY MOUTH EVERY DAY   saccharomyces boulardii (FLORASTOR) 250 MG capsule Take 1 capsule (250 mg total) by mouth 2 (two) times daily. (Patient taking differently:  Take 250 mg by mouth as needed.)   triamcinolone cream (KENALOG) 0.1 % Apply 1 Application topically 3 (three) times daily.   No facility-administered encounter medications on file as of 01/08/2022.    PAST MEDICAL HISTORY: Past Medical History:  Diagnosis Date   Ankylosing spondylitis ([place])    Anxiety    Arthritis    Breast cancer ([place])    right breast   Bursitis of hip, right    Cardiac arrhythmia    Carotid stenosis 08/26/2021   GE reflux    Hiatal hernia    Hypertension    Hyperthyroidism    Ischemic bowel disease ([place])    Prediabetes 08/26/2021   Snoring 03/21/2014   Vitamin D deficiency     PAST SURGICAL HISTORY: Past Surgical History:  Procedure Laterality Date   ABDOMINAL HYSTERECTOMY     BREAST LUMPECTOMY     right   MASTECTOMY     double    ALLERGIES: Allergies  Allergen Reactions   Shellfish Allergy Anaphylaxis   Blue Dyes (Parenteral)    Hydrochlorothiazide Itching    FAMILY HISTORY: Family History  Problem Relation Age of Onset   Diabetes Mother    Hypertension Mother    Hyperlipidemia Mother    High blood pressure Father    High Cholesterol Father    Colitis Brother    Goiter Maternal Grandmother    Breast cancer Maternal Aunt     SOCIAL HISTORY: Social History   Tobacco Use   Smoking status: Never   Smokeless tobacco: Never  Vaping Use   Vaping Use: Never used  Substance Use Topics   Alcohol use: No   Drug use: No   Social History   Social History Narrative   Caffeine occasional drinker.   Not working/disabled.  12th grader. Divorced, one kids.       Social history: She receives disability benefits for history of breast cancer 2007 and ankylosing spondylitis.  She resides with special needs son.  She is divorced.  Non-smoker.  No alcohol consumption.       Family history: Father with history of prostate cancer.  Patient's mother was diagnosed with breast cancer at age 1.  Maternal aunt with history of breast cancer.  3 brothers.    Right handed   Caffeine occas   One level stays on this level         OBJECTIVE: PHYSICAL EXAM: BP 122/83   Pulse 84   Ht $R'5\' 1"'rg$  (1.549 m)   Wt 178 lb (80.7 kg)   SpO2 98%   BMI 33.63 kg/m   General: General appearance: Awake and alert. No distress. Cooperative with exam.  Skin: No obvious rash or jaundice. HEENT: Atraumatic. Anicteric. Lungs: Non-labored breathing on room air  Extremities: No edema. No obvious deformity.  Musculoskeletal: No obvious joint swelling. Psych: Affect appropriate.  Neurological: Mental Status: Alert. Speech fluent. No pseudobulbar affect Cranial Nerves: CNII: No RAPD. Visual fields grossly intact. CNIII, IV, VI: PERRL. No nystagmus. EOMI. CN V: Facial sensation intact bilaterally to fine touch CN VII: Facial muscles symmetric and strong. No ptosis at rest. CN VIII: Hearing grossly intact bilaterally. CN IX:  No hypophonia. CN X: Palate elevates symmetrically. CN XI: Full strength shoulder shrug bilaterally. CN XII: Tongue protrusion full and midline. No atrophy or fasciculations. No significant dysarthria Motor: Tone is normal.  Individual muscle group testing (MRC grade out of 5):  Movement     Neck flexion 5    Neck extension 5     Right Left   Shoulder abduction 5 5   Elbow flexion 5 5   Elbow extension 5 5   Wrist extension 5 5   Wrist flexion 5 5   Finger abduction - FDI 5 5   Finger abduction - ADM 5 5   Finger extension 5 5   Finger distal flexion - 2/$RemoveBef'3 5 5   'SbDkmzTfem$ Finger distal flexion - 4/$RemoveBef'5 5 5   'qGHoaXIPdT$ Thumb flexion - FPL 5 5   Thumb abduction - APB 5 5    Hip flexion 5 5   Hip extension 5 5   Hip adduction 5 5   Hip abduction 5 5   Knee extension 5 5   Knee flexion 5 5   Dorsiflexion 5 5   Plantarflexion 5 5   Inversion 5 5   Eversion 5 5   Great toe extension 5- 5-   Great toe flexion 5 5     Reflexes:  Right Left   Bicep 2+ 2+   Tricep 2+ 2+   BrRad 2+ 2+   Knee 2+ 2+   Ankle 1+ 1+    Pathological  Reflexes: Babinski: flexor response bilaterally Hoffman: absent bilaterally Troemner: absent bilaterally Sensation: Pinprick: Diminished throughout bilateral upper and lower extremities Vibration: Diminished in hands and feet Proprioception: Diminished in bilateral great toes Coordination: Intact finger-to- nose-finger bilaterally. Romberg negative. Gait: Able to rise from chair with arms crossed unassisted. Normal, narrow-based gait. Able to tandem walk. Able to walk on toes and heels.  Lab and Test Review: Internal labs: Normal or unremarkable: CK (86), CMP, RF, CCP, ANA, ESR, folate, TSH B12: 321 HbA1c (04/05/20): 6.0  Lumbar x-ray (10/26/21): FINDINGS: There is 6 mm of anterolisthesis at L5 on S1 which has mildly progressed. Alignment is otherwise anatomic. There is no acute fracture. Disc spaces are preserved. There are degenerative changes of facet joints at L5-S1. Soft tissues are within normal limits.   IMPRESSION: 1. Degenerative changes of facet joints at L5-S1. 2. Grade 1 anterolisthesis at L5-S1 has progressed compared to 2018.  MRI lumbar spine wo contrast (08/18/16): FINDINGS: Segmentation:  Standard.   Alignment:  Minimal grade 1 anterolisthesis of L5 on S1.   Vertebrae:  No fracture, evidence of discitis, or bone lesion.   Conus medullaris: Extends to the L1 level and appears normal.   Paraspinal and other soft tissues: No paraspinal abnormality.   Disc levels:   Disc spaces: Degenerative disc disease with mild disc height loss at L5-S1.   T12-L1: No significant disc bulge. No evidence of neural foraminal stenosis. No central canal stenosis.   L1-L2: No significant disc bulge. No evidence of neural foraminal stenosis. No central canal stenosis.   L2-L3: No significant disc bulge. No evidence of neural foraminal stenosis. No central canal stenosis.   L3-L4: No significant disc bulge. No evidence of neural foraminal stenosis. No central canal  stenosis.   L4-L5: No significant disc bulge. No evidence of neural foraminal stenosis. No central canal stenosis. Mild left facet arthropathy at L5-S1.   L5-S1: Mild broad-based disc bulge. Moderate bilateral facet arthropathy. No evidence of neural foraminal stenosis. No central  canal stenosis.   IMPRESSION: 1. At L5-S1 there is a mild broad-based disc bulge. Moderate bilateral facet arthropathy. Minimal grade 1 anterolisthesis of L5 on S1 secondary to facet disease.  Cervical spine xray (06/30/16): FINDINGS: There is no evidence of cervical spine fracture or prevertebral soft tissue swelling. Alignment is normal. No other significant bone abnormalities are identified. Calcification right neck likely atherosclerotic calcification right carotid.   IMPRESSION: Negative cervical spine radiographs.   Right carotid calcification  CT head wo contrast (02/09/12): Findings: There is no evidence of acute infarction, mass lesion, or  intra- or extra-axial hemorrhage on CT.   The posterior fossa, including the cerebellum, brainstem and fourth  ventricle, is within normal limits.  The third and lateral  ventricles, and basal ganglia are unremarkable in appearance.  The  cerebral hemispheres are symmetric in appearance, with normal gray-  white differentiation.  No mass effect or midline shift is seen.   There is no evidence of fracture; visualized osseous structures are  unremarkable in appearance.  The visualized portions of the orbits  are within normal limits.  The paranasal sinuses and mastoid air  cells are well-aerated.  No significant soft tissue abnormalities  are seen.   IMPRESSION:  Unremarkable noncontrast CT of the head.    ASSESSMENT: Sonya Franklin is a 57 y.o. female who presents for evaluation of numbness and tingling of hands and feet. She has a relevant medical history of ankylosing spondylitis, sciatica, carotid stenosis, OA, breast cancer (surgery and chemo),  HTN, HLD, ischemic bowel disease, hyperthyroidism, pre-diabetes, vit D deficiency, fibromyalgia, anxiety. Her neurological examination is pertinent for sensory deficits in bilateral arms and legs (not stocking and glove but whole limb). Available diagnostic data is significant for B12 of 321, HbA1c of 6.0. The etiology of patient's symptoms are currently unclear but are likely multifactorial with contributions from neuropathy, lumbosacral radiculopathy, ankylosing spondylitis, OA, and fibromyalgia. Her risk factors for neuropathy include pre-diabetes and chemotherapy. She was on paclitaxel and had a low vit D, which is a risk factor for neuropathy, though the timing is hard to determine. I will get further labs to look for treatable causes and an EMG to further clarify etiology.  PLAN: -Blood work: IFE, HbA1c -EMG: PN protocol (R > L) -Start Cymbalta 30 mg qhs for 1 week then increase to 60 mg qhs if tolerating -Take vit D 1000 IU daily  -Return to clinic in 6 months  The impression above as well as the plan as outlined below were extensively discussed with the patient who voiced understanding. All questions were answered to their satisfaction.  The patient was counseled on pertinent fall precautions per the printed material provided today, and as noted under the "Patient Instructions" section below.  When available, results of the above investigations and possible further recommendations will be communicated to the patient via telephone/MyChart. Patient to call office if not contacted after expected testing turnaround time.   Total time spent reviewing records, interview, history/exam, documentation, and coordination of care on day of encounter:  65 min   Thank you for allowing me to participate in patient's care.  If I can answer any additional questions, I would be pleased to do so.  Kai Levins, MD   CC: Renold Genta Cresenciano Lick, MD [address] [place] [address]  CC: Referring  provider: Prince Rome, PA-C [address] [place],  [place] [address]

## 2022-01-07 DIAGNOSIS — M5030 Other cervical disc degeneration, unspecified cervical region: Secondary | ICD-10-CM | POA: Diagnosis not present

## 2022-01-07 DIAGNOSIS — M450 Ankylosing spondylitis of multiple sites in spine: Secondary | ICD-10-CM | POA: Diagnosis not present

## 2022-01-07 DIAGNOSIS — M797 Fibromyalgia: Secondary | ICD-10-CM | POA: Diagnosis not present

## 2022-01-07 DIAGNOSIS — M5136 Other intervertebral disc degeneration, lumbar region: Secondary | ICD-10-CM | POA: Diagnosis not present

## 2022-01-07 DIAGNOSIS — R5382 Chronic fatigue, unspecified: Secondary | ICD-10-CM | POA: Diagnosis not present

## 2022-01-08 ENCOUNTER — Encounter: Payer: Self-pay | Admitting: Neurology

## 2022-01-08 ENCOUNTER — Telehealth: Payer: Self-pay | Admitting: Internal Medicine

## 2022-01-08 ENCOUNTER — Other Ambulatory Visit (INDEPENDENT_AMBULATORY_CARE_PROVIDER_SITE_OTHER): Payer: Medicare Other

## 2022-01-08 ENCOUNTER — Ambulatory Visit: Payer: Medicare Other | Admitting: Neurology

## 2022-01-08 VITALS — BP 122/83 | HR 84 | Ht 61.0 in | Wt 178.0 lb

## 2022-01-08 DIAGNOSIS — R209 Unspecified disturbances of skin sensation: Secondary | ICD-10-CM

## 2022-01-08 DIAGNOSIS — M797 Fibromyalgia: Secondary | ICD-10-CM

## 2022-01-08 DIAGNOSIS — G629 Polyneuropathy, unspecified: Secondary | ICD-10-CM

## 2022-01-08 DIAGNOSIS — M5431 Sciatica, right side: Secondary | ICD-10-CM

## 2022-01-08 DIAGNOSIS — M457 Ankylosing spondylitis of lumbosacral region: Secondary | ICD-10-CM

## 2022-01-08 MED ORDER — DULOXETINE HCL 30 MG PO CPEP: 60.0000 mg | ORAL_CAPSULE | Freq: Every day | ORAL | 5 refills | Status: DC

## 2022-01-08 NOTE — Telephone Encounter (Signed)
Patient called and wanted to discuss things from [place] appointment she had. Call back is [phone]. Does she need to be seen and if so whe do you want to see her?

## 2022-01-08 NOTE — Patient Instructions (Signed)
I would like to do some lab work today to look for treatable causes of neuropathy (nerve damage).  I would like to get a muscle and nerve test called an EMG to look for nerve damage (neuropathy or a pinched nerve). I am providing more information about that below. You can schedule this today before you leave.  I will be in touch when I have your results.  I am starting a medication for nerve pain call Cymbalta. You will take 1 tablet (30 mg) daily for 1 week. If you have no side effects after a week, increase to 2 tablets (60 mg) daily. Please call me if you have problems with the medication or do not notice it is helping after 1 month.  I would like to see you back in clinic in about 6 months.  The physicians and staff at [facility] are committed to providing excellent care. You may receive a survey requesting feedback about your experience at our office. We strive to receive "very good" responses to the survey questions. If you feel that your experience would prevent you from giving the office a "very good " response, please contact our office to try to remedy the situation. We may be reached at [phone]. Thank you for taking the time out of your busy day to complete the survey.  Kai Levins, MD [facility]  ELECTROMYOGRAM AND NERVE CONDUCTION STUDIES (EMG/NCS) INSTRUCTIONS  How to Prepare The neurologist conducting the EMG will need to know if you have certain medical conditions. Tell the neurologist and other EMG lab personnel if you: Have a pacemaker or any other electrical medical device Take blood-thinning medications Have hemophilia, a blood-clotting disorder that causes prolonged bleeding Bathing Take a shower or bath shortly before your exam in order to remove oils from your skin. Don't apply lotions or creams before the exam.  What to Expect You'll likely be asked to change into a hospital gown for the procedure and lie down on an examination table. The following  explanations can help you understand what will happen during the exam.  Electrodes. The neurologist or a technician places surface electrodes at various locations on your skin depending on where you're experiencing symptoms. Or the neurologist may insert needle electrodes at different sites depending on your symptoms.  Sensations. The electrodes will at times transmit a tiny electrical current that you may feel as a twinge or spasm. The needle electrode may cause discomfort or pain that usually ends shortly after the needle is removed. If you are concerned about discomfort or pain, you may want to talk to the neurologist about taking a short break during the exam.  Instructions. During the needle EMG, the neurologist will assess whether there is any spontaneous electrical activity when the muscle is at rest - activity that isn't present in healthy muscle tissue - and the degree of activity when you slightly contract the muscle.  He or she will give you instructions on resting and contracting a muscle at appropriate times. Depending on what muscles and nerves the neurologist is examining, he or she may ask you to change positions during the exam.  After your EMG You may experience some temporary, minor bruising where the needle electrode was inserted into your muscle. This bruising should fade within several days. If it persists, contact your primary care doctor.

## 2022-01-08 NOTE — Telephone Encounter (Signed)
Left message for patient to return call to office at [phone].  

## 2022-01-09 LAB — HEMOGLOBIN A1C: Hgb A1c MFr Bld: 6.2 % (ref 4.6–6.5)

## 2022-01-12 ENCOUNTER — Ambulatory Visit
Admission: RE | Admit: 2022-01-12 | Discharge: 2022-01-12 | Disposition: A | Discharge status: Home / Self Care | Payer: Medicare Other | Source: Ambulatory Visit | Attending: Internal Medicine | Admitting: Internal Medicine

## 2022-01-12 DIAGNOSIS — K862 Cyst of pancreas: Secondary | ICD-10-CM | POA: Diagnosis not present

## 2022-01-12 DIAGNOSIS — J9811 Atelectasis: Secondary | ICD-10-CM | POA: Diagnosis not present

## 2022-01-12 DIAGNOSIS — Z853 Personal history of malignant neoplasm of breast: Secondary | ICD-10-CM | POA: Diagnosis not present

## 2022-01-12 DIAGNOSIS — K449 Diaphragmatic hernia without obstruction or gangrene: Secondary | ICD-10-CM | POA: Diagnosis not present

## 2022-01-12 MED ORDER — IOPAMIDOL (ISOVUE-370) INJECTION 76%
75.0000 mL | Freq: Once | INTRAVENOUS | Status: AC | PRN
Start: 2022-01-12 — End: 2022-01-12
  Administered 2022-01-12: 75 mL via INTRAVENOUS

## 2022-01-14 LAB — IMMUNOFIXATION ELECTROPHORESIS
IgG (Immunoglobin G), Serum: 883 mg/dL (ref 600–1640)
IgM, Serum: 23 mg/dL — ABNORMAL LOW (ref 50–300)
Immunoglobulin A: 106 mg/dL (ref 47–310)

## 2022-01-16 ENCOUNTER — Ambulatory Visit: Payer: Medicare Other | Admitting: Internal Medicine

## 2022-01-16 ENCOUNTER — Encounter: Payer: Self-pay | Admitting: Internal Medicine

## 2022-01-16 VITALS — BP 136/84 | HR 78 | Temp 98.5°F | Ht 61.0 in | Wt 184.4 lb

## 2022-01-16 DIAGNOSIS — G629 Polyneuropathy, unspecified: Secondary | ICD-10-CM | POA: Diagnosis not present

## 2022-01-16 DIAGNOSIS — J0100 Acute maxillary sinusitis, unspecified: Secondary | ICD-10-CM

## 2022-01-16 DIAGNOSIS — J9811 Atelectasis: Secondary | ICD-10-CM | POA: Diagnosis not present

## 2022-01-16 DIAGNOSIS — K862 Cyst of pancreas: Secondary | ICD-10-CM | POA: Diagnosis not present

## 2022-01-16 DIAGNOSIS — Z853 Personal history of malignant neoplasm of breast: Secondary | ICD-10-CM

## 2022-01-16 MED ORDER — METHYLPREDNISOLONE ACETATE 80 MG/ML IJ SUSP
80.0000 mg | Freq: Once | INTRAMUSCULAR | Status: AC
  Administered 2022-01-16: 80 mg via INTRAMUSCULAR

## 2022-01-16 NOTE — Patient Instructions (Addendum)
She is seeing Dr. Berdine Addison for evaluation of polyneuropathy.  She has seen Rheumatology and she says DMARD was recommended.  She has concerns about persistent atelectasis on her chest x-ray.  She will be referred to pulmonary.  She is worried about recurrence of her breast cancer which was a number of years ago.  Perhaps a PET scan as an order.  I will leave that up to Pulmonary.  Order MR of pancreas to follow-up on cyst that has apparently enlarged.  Depo-Medrol 80 mg IM given today at patient request.  I have indicated to her we will likely not continue these injections on a regular basis and she needs to follow-up with rheumatology.

## 2022-01-16 NOTE — Progress Notes (Signed)
Subjective:    Patient ID: Sonya Franklin, female    DOB: [birth year], 56 y.o.   MRN: [id number]  HPI Here today for follow up.  She had a recent CT of abdomen and pelvis showing that the size of her pancreatic cyst that was previously reported is now "1.9 x 1.5 in the body of the pancreas and has increased in size compared to 2020."  It was not mentioned on CT in 2018.  Oddly, report does not mention cyst in 2020 either.  However, it seems to be with her history of breast cancer we need to follow-up on all of these issues that she has since she is not feeling well.  She went to see Rheumatologist regarding chronic musculoskeletal pain.  Patient says DMARD therapy was recommended by rheumatology.  She is not sure she wants to take this.  She has been getting some intermittent Depo-Medrol injections from time to time for musculoskeletal pain up until recently when we did an extensive rheumatology work-up with lab work.  Her sed rate was 31, ANA negative, rheumatoid factor less than 14, CCP less than 16, total CK 86, low IgM of 23.  She saw Dr. Berdine Addison at [facility] neurologist recently after rheumatology recommended evaluation for numbness and tingling in hands and feet.  She told Dr. Berdine Addison that she developed numbness and tingling in hands and feet sometime after chemotherapy for breast cancer.  She also has history of right-sided sciatica and right leg pain.  She receives disability benefits for history of breast cancer in 2007.  She does have 6 mm of anterior listhesis L5 on S1 which is mildly progressed from 2018 according to Dr. Berdine Addison.  He did note that her neuro exam was pertinent for sensory deficits in bilateral arms and legs but only on not stocking glove distribution.  B12, hemoglobin A1c stable.  He felt that paclitaxel and low vitamin D level were possibilities for neuropathy symptoms and has recommended EMG.  He also started her on Cymbalta.  Advised her to take vitamin D 1000 units daily.  Today she  wants to get a shot of Depo-Medrol.  She thinks she needs to get through the holidays and that her musculoskeletal pain is somewhat unbearable particularly with the weather change.  I have told her we will not continue to give her Depo-Medrol injections that she has to pursue Rheumatology and neurology follow-up.  She says that the rheumatologist did not advise getting Depo-Medrol injections.  She is worried about cancer coming back.  She ask about her chest CT.  Report was reviewed with her.  She is concerned about atelectasis (small amount) in the right middle lobe and lingula.  She also has a 1.9 x 1.5 cystic lesion in the body of the pancreas which is increased compared to 2020 noted in upper abdomen on chest CT.-We are ordering an MRI to further delineate the cyst.  We are going to refer her to pulmonary to help evaluate her atelectasis and rule out cancer.  She may need a PET scan.  Perhaps we can put her anxiety to rest with this thorough evaluation.    Review of Systems She has a no fever, chills, hot or swollen joints at this point in time.  She thinks she has respiratory congestion.    Objective:   Physical Exam Vital signs are reviewed.  On my exam her chest is clear to auscultation bilaterally.  I do not appreciate any hot or swollen joints.  Cardiac exam:  Regular rate and rhythm.       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  Patient needs to rule out cancer.  Perhaps a PET scan as an order.  Musculoskeletal pain-seen by Rheumatology.  She says the more medication has been recommended rather than Depo-Medrol injections from time to time.  She had seen rheumatologist in the remote past, Dr. Justine Null who had postulated that she had ankylosing spondylitis.  Anxiety about her health with history of breast cancer in the remote past.  Has been disease-free for a number of years.  Polyneuropathy being evaluated by  Dr. Berdine Addison, neurology and is to have  NCV with EMG and immunofixation electrophoresis.  Recent hemoglobin A1c 6.2% in October 26  Time spent reviewing chart, ordering MRI of the pancreas, reviewing records, seeing patient and medical decision making is 50 minutes.

## 2022-01-19 ENCOUNTER — Telehealth: Payer: Self-pay

## 2022-01-19 ENCOUNTER — Other Ambulatory Visit: Payer: Self-pay

## 2022-01-19 DIAGNOSIS — K862 Cyst of pancreas: Secondary | ICD-10-CM

## 2022-01-19 NOTE — Telephone Encounter (Signed)
[place] and was told no Prior Josem Kaufmann was needed for medicare patients, I will call patient to inform.

## 2022-01-19 NOTE — Addendum Note (Signed)
Addended by: Geradine Girt D on: 01/19/2022 09:17 AM   Modules accepted: Orders

## 2022-01-19 NOTE — Telephone Encounter (Signed)
Called [facility] to schedule appt and was told they do not schedule MRI appts unless the patient is in the office and that someone from their office will contact the patient to schedule MRI

## 2022-01-22 ENCOUNTER — Other Ambulatory Visit: Payer: Self-pay | Admitting: Neurology

## 2022-01-22 DIAGNOSIS — G629 Polyneuropathy, unspecified: Secondary | ICD-10-CM

## 2022-02-04 ENCOUNTER — Telehealth: Payer: Self-pay | Admitting: Internal Medicine

## 2022-02-04 ENCOUNTER — Encounter: Payer: Self-pay | Admitting: Internal Medicine

## 2022-02-04 NOTE — Telephone Encounter (Signed)
[facility] has been trying to reach patient to schedule MRI of abdomen. I left voice mail message for patient today. She is to call  [phone] to schedule. MJB, MD

## 2022-02-23 ENCOUNTER — Ambulatory Visit
Admission: RE | Admit: 2022-02-23 | Discharge: 2022-02-23 | Disposition: A | Discharge status: Home / Self Care | Payer: Medicare Other | Source: Ambulatory Visit | Attending: Internal Medicine | Admitting: Internal Medicine

## 2022-02-23 DIAGNOSIS — K862 Cyst of pancreas: Secondary | ICD-10-CM | POA: Diagnosis not present

## 2022-02-23 MED ORDER — GADOPICLENOL 0.5 MMOL/ML IV SOLN
9.0000 mL | Freq: Once | INTRAVENOUS | Status: AC | PRN
  Administered 2022-02-23: 9 mL via INTRAVENOUS

## 2022-02-24 ENCOUNTER — Telehealth: Payer: Self-pay

## 2022-02-24 NOTE — Telephone Encounter (Signed)
scheduled

## 2022-02-24 NOTE — Telephone Encounter (Signed)
Diane from [facility] called to give report and would like you to take a look at Impression #2

## 2022-02-25 ENCOUNTER — Encounter: Payer: Self-pay | Admitting: Internal Medicine

## 2022-02-25 ENCOUNTER — Ambulatory Visit (INDEPENDENT_AMBULATORY_CARE_PROVIDER_SITE_OTHER): Payer: Medicare Other | Admitting: Internal Medicine

## 2022-02-25 VITALS — BP 116/82 | HR 75 | Temp 98.6°F | Ht 60.0 in | Wt 181.0 lb

## 2022-02-25 DIAGNOSIS — Z853 Personal history of malignant neoplasm of breast: Secondary | ICD-10-CM | POA: Diagnosis not present

## 2022-02-25 DIAGNOSIS — M7918 Myalgia, other site: Secondary | ICD-10-CM | POA: Diagnosis not present

## 2022-02-25 DIAGNOSIS — R7302 Impaired glucose tolerance (oral): Secondary | ICD-10-CM | POA: Diagnosis not present

## 2022-02-25 DIAGNOSIS — Z8739 Personal history of other diseases of the musculoskeletal system and connective tissue: Secondary | ICD-10-CM

## 2022-02-25 MED ORDER — METHYLPREDNISOLONE ACETATE 80 MG/ML IJ SUSP
80.0000 mg | Freq: Once | INTRAMUSCULAR | Status: AC
  Administered 2022-02-25: 80 mg via INTRAMUSCULAR

## 2022-02-25 NOTE — Progress Notes (Signed)
Subjective:    Patient ID: [facility], female    DOB: December 20, 1965, 56 y.o.   MRN: [id number]  HPI  Patient was seen in early November.  At that time she had had a recent CT of her abdomen and pelvis showing that the size of her pancreatic cyst had increased in size.  Longstanding history of chronic musculoskeletal pain.  Diagnosed years ago as having ankylosing spondylitis.  Recently referred to [facility] and DMARD therapy has been recommended.  Patient says DMARD therapy was recommended by rheumatologist and she is not sure she wants to take this.  Has been getting intermittent Depo-Medrol injections here from time to time for musculoskeletal pain and asked to get 1 today.  It was provided.  She had extensive rheumatology workup here in the past.  Rheumatology recently recommended evaluation for numbness and tingling in her hands and she saw neurologist at [facility].  Patient indicated she had developed peripheral neuropathy symptoms sometime after chemotherapy for breast cancer.  She also has history of right-sided sciatica and right leg pain.  Neurologist noted patient had sensory deficits in the arms bilaterally and legs.  B12 level was within normal limits.  He felt that paclitaxel and low vitamin D level were possibilities for neuropathy symptoms.  He also started her on Cymbalta.  He advised her to take 1000 units of vitamin D daily.  Patient is concerned about recurrence of cancer.  There is a small amount of atelectasis in the right middle lobe and lingula.  She will be referred to pulmonary.  Also has a 1.9 x 1.5 cystic lesion in the body of her pancreas which has increased in size compared to 2020.  MRI was ordered recently showing mild hepatic steatosis.  Has bilateral breast prosthesis.  There is a well-circumscribed lobular septated lesion in the pancreatic body/tail measuring 20 x 19 x 14 mm.  Previously the lesion measured 12 x 9 x 9 mm.  No pancreatic ductal  dilatation noted.  No splenomegaly or focal splenic lesion.  Radiologist suggested endoscopic ultrasound/fine-needle biopsy given growth of pancreatic lesion.  Patient will be referred to Gastroenterology for evaluation.  Also found at this time was a 5 x 3.2 lesion in the right hemisacrum with probable involvement of the right L5-S1 nerve root.  This may be causing her chronic musculoskeletal pain.  Findings were concerning for metastatic disease.  Patient will be referred to oncology for evaluation.  Patient indicates she does not want to take DMARD therapy for joint pain.  Is reluctant to return to rheumatologist at this point.  Patient has remote history of breast cancer and has breast implants.  Review of Systems see above no new complaints     Objective:   Physical Exam Blood pressure excellent at 116/82 pulse 75 weight 181 pounds height 5 feet 0 inches BMI 35.35  Patient was not examined today.  Explained in detail today concerns regarding MRI findings.  She is agreeable to consultations with GI and Oncology.  Septated lesion in the pancreatic body/tail measuring 20 x 19 x 14 mm having increased in size from 12 x 9 x 9 mm without pancreatic ductal dilatation.  No splenomegaly.  Radiologist has recommended endoscopic ultrasound evaluation.  Will be referred to [facility].     Assessment & Plan:  Chronic musculoskeletal pain-concern for lesion 5 x 3.2 mm in right hemisacrum possibly involving the right L5-S1 nerve root which may be causing her chronic musculoskeletal pain.  Depo-Medrol 80  mg IM given today in the office and she will be referred to Oncology.  She has a remote history of breast cancer.  Neurologist placed her on Cymbalta for pain control.  Chronic musculoskeletal pain evaluated by Rheumatology recently.  Patient does not want to take DMARD therapy.  History of breast cancer years ago with no evidence of recurrence but nail hands lesion and right hemisacrum  raising possibility of metastatic disease.  Septated lesion in pancreatic body/tail measuring 20 x 19 x 14 mm.  Radiologist recommending endoscopic ultrasound evaluation.  Referred to [facility].  Essential hypertension stable.  GE reflux treated with Protonix.  Referral to Oncology regarding lesion in right hemisacrum and to [facility] regarding pancreatic lesion.

## 2022-02-25 NOTE — Patient Instructions (Addendum)
Patient agreeable to  consultations with Oncology and Gastroenterology.  Depo-Medrol 80 mg IM given today which helps with her chronic musculoskeletal pain.

## 2022-03-02 ENCOUNTER — Encounter: Payer: Medicare Other | Admitting: Neurology

## 2022-03-02 NOTE — Progress Notes (Unsigned)
[place] [phone]   [phone]  INITIAL CONSULTATION:  Patient Care Team: Elby Showers, MD as PCP - General (Internal Medicine)  CHIEF COMPLAINTS/PURPOSE OF CONSULTATION:  "Abnormal MRI of abdomen "  HISTORY OF PRESENTING ILLNESS:  Sonya [facility] 56 y.o. female with medical history significant for hypertension, ankylosing spondylitis vs degenerative arthritis, fibromyalgia, breast cancer in 2006 s/p lumpectomy, bilateral mastectomy and chemotherapy, carotid stenosis, prediabetes, anxiety, hyperthyroidism.  On review of the previous records patient had CT Chest 01/12/22 for evaluation of chest pain that showed cystic lesion in the body of the pancreas increased in size compared to CT AP on 02/07/2019.  She underwent MR abdomen on 02/23/2022 that showed increase size of well circumscribed lobular septated cystic 2 cm lesion in the pancreatic body/tail. 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 Referral was sent to Dr. Rush Landmark by PCP for further work up. MR abdomen also showed enhancing 5.0 x 3.2 cm lesion in the right Hemi sacrum/L5 posterior elements with probable involvement of the right L5 and S1 nerve root and small enhancing lesions in the left hemisacrum and multiple vertebral bodies which is concerning for metastatic disease. Patient has longstanding history of pain that started in 1989 when she was diagnosed with ankylosing spondylitis by rheumatologist Dr. Reece Levy.   On exam today ***   Mc tumor types affecting sacrum are mets from common solid organ malignancy- such as breast, or heme malignancy such as multiple myeloma Breast pain? Urinary or bowel incontinence Bone pain Weakness in legs or arms Needs MR with and without lumbar vs total spine vs PET? CA 19-9, CA 125, CBC, CMP -ct guided biopsy? Stat vs routine, by IR?  MEDICAL HISTORY:  Past Medical History:  Diagnosis  Date   Ankylosing spondylitis ([place])    Anxiety    Arthritis    Breast cancer ([place])    right breast   Bursitis of hip, right    Cardiac arrhythmia    Carotid stenosis 08/26/2021   GE reflux    Hiatal hernia    Hypertension    Hyperthyroidism    Ischemic bowel disease ([place])    Prediabetes 08/26/2021   Snoring 03/21/2014   Vitamin D deficiency     SURGICAL HISTORY: Past Surgical History:  Procedure Laterality Date   ABDOMINAL HYSTERECTOMY     BREAST LUMPECTOMY     right   MASTECTOMY     double    SOCIAL HISTORY: Social History   Socioeconomic History   Marital status: Legally Separated    Spouse name: Not on file   Number of children: 1   Years of education: Not on file   Highest education level: Not on file  Occupational History    Employer: UNEMPLOYED  Tobacco Use   Smoking status: Never   Smokeless tobacco: Never  Vaping Use   Vaping Use: Never used  Substance and Sexual Activity   Alcohol use: No   Drug use: No   Sexual activity: Yes  Other Topics Concern   Not on file  Social History Narrative   Caffeine occasional drinker.   Not working/disabled.  12th grader. Divorced, one kids.       Social history: She receives disability benefits for history of breast cancer 2007 and ankylosing spondylitis.  She resides with special needs son.  She is divorced.  Non-smoker.  No alcohol consumption.       Family history: Father with history of  prostate cancer.  Patient's mother was diagnosed with breast cancer at age 91.  Maternal aunt with history of breast cancer.  3 brothers.   Right handed   Caffeine occas   One level stays on this level       Social Determinants of Health   Financial Resource Strain: Low Risk  (08/26/2021)   Overall Financial Resource Strain (CARDIA)    Difficulty of Paying Living Expenses: Not hard at all  Food Insecurity: No Food Insecurity (08/26/2021)   Hunger Vital Sign    Worried About Running Out of Food in the Last Year: Never true     Ran Out of Food in the Last Year: Never true  Transportation Needs: No Transportation Needs (08/26/2021)   PRAPARE - Hydrologist (Medical): No    Lack of Transportation (Non-Medical): No  Physical Activity: Inactive (08/26/2021)   Exercise Vital Sign    Days of Exercise per Week: 0 days    Minutes of Exercise per Session: 0 min  Stress: Not on file  Social Connections: Not on file  Intimate Partner Violence: Not on file    FAMILY HISTORY: Family History  Problem Relation Age of Onset   Diabetes Mother    Hypertension Mother    Hyperlipidemia Mother    High blood pressure Father    High Cholesterol Father    Colitis Brother    Goiter Maternal Grandmother    Breast cancer Maternal Aunt     ALLERGIES:  is allergic to shellfish allergy, blue dyes (parenteral), and hydrochlorothiazide.  MEDICATIONS:  Current Outpatient Medications  Medication Sig Dispense Refill   ALPRAZolam (XANAX) 0.25 MG tablet TAKE 1 TABLET (0.25 MG TOTAL) BY MOUTH 2 (TWO) TIMES DAILY AS NEEDED FOR ANXIETY. 60 tablet 5   amLODipine (NORVASC) 5 MG tablet TAKE 1 TABLET (5 MG TOTAL) BY MOUTH DAILY. 90 tablet 2   DULoxetine (CYMBALTA) 30 MG capsule Take 2 capsules (60 mg total) by mouth daily. Take 30 mg (1 tablet), if no side effects after 1 week, increase to 60 mg (2 tablets) thereafter 60 capsule 5   furosemide (LASIX) 20 MG tablet Take 1 tablet (20 mg total) by mouth daily as needed. 10 tablet 1   hyoscyamine (ANASPAZ) 0.125 MG TBDP disintergrating tablet Place 1 tablet (0.125 mg total) under the tongue every 4 (four) hours as needed for cramping. 30 tablet 0   ibuprofen (ADVIL) 800 MG tablet TAKE 1 TABLET BY MOUTH EVERY 8 HOURS AS NEEDED FOR PAIN 180 tablet 3   olopatadine (PATANOL) 0.1 % ophthalmic solution Place 1 drop into both eyes 2 (two) times daily as needed. 5 mL PRN   pantoprazole (PROTONIX) 40 MG tablet TAKE 1 TABLET BY MOUTH EVERY DAY 90 tablet 3   saccharomyces boulardii  (FLORASTOR) 250 MG capsule Take 1 capsule (250 mg total) by mouth 2 (two) times daily. (Patient taking differently: Take 250 mg by mouth as needed.) 40 capsule 0   triamcinolone cream (KENALOG) 0.1 % Apply 1 Application topically 3 (three) times daily. 30 g 1   No current facility-administered medications for this visit.    REVIEW OF SYSTEMS:   Constitutional: ( - ) fevers, ( - )  chills , ( - ) night sweats Eyes: ( - ) blurriness of vision, ( - ) double vision, ( - ) watery eyes Ears, nose, mouth, throat, and face: ( - ) mucositis, ( - ) sore throat Respiratory: ( - ) cough, ( - )  dyspnea, ( - ) wheezes Cardiovascular: ( - ) palpitation, ( - ) chest discomfort, ( - ) lower extremity swelling Gastrointestinal:  ( - ) nausea, ( - ) heartburn, ( - ) change in bowel habits Skin: ( - ) abnormal skin rashes Lymphatics: ( - ) new lymphadenopathy, ( - ) easy bruising Neurological: ( - ) numbness, ( - ) tingling, ( - ) new weaknesses Behavioral/Psych: ( - ) mood change, ( - ) new changes  All other systems were reviewed with the patient and are negative.  PHYSICAL EXAMINATION: ECOG PERFORMANCE STATUS: {CHL ONC ECOG PS:[id number]}  There were no vitals filed for this visit. There were no vitals filed for this visit.  GENERAL: well appearing *** in NAD  SKIN: skin color, texture, turgor are normal, no rashes or significant lesions EYES: conjunctiva are pink and non-injected, sclera clear OROPHARYNX: no exudate, no erythema; lips, buccal mucosa, and tongue normal  NECK: supple, non-tender LYMPH:  no palpable lymphadenopathy in the cervical, axillary or supraclavicular lymph nodes.  LUNGS: clear to auscultation and percussion with normal breathing effort HEART: regular rate & rhythm and no murmurs and no lower extremity edema ABDOMEN: soft, non-tender, non-distended, normal bowel sounds Musculoskeletal: no cyanosis of digits and no clubbing  PSYCH: alert & oriented x 3, fluent speech NEURO:  no focal motor/sensory deficits  LABORATORY DATA:  I have reviewed the data as listed    Latest Ref Rng & Units 10/30/2021   12:38 PM 09/18/2021   12:31 PM 04/08/2021   11:33 AM  CBC  WBC 3.8 - 10.8 Thousand/uL 5.7  6.1  6.1   Hemoglobin 11.7 - 15.5 g/dL 11.4  11.2  11.6   Hematocrit 35.0 - 45.0 % 34.6  33.1  35.3   Platelets 140 - 400 Thousand/uL 278  266  270        Latest Ref Rng & Units 10/30/2021   12:38 PM 09/18/2021   12:31 PM 04/08/2021   11:33 AM  CMP  Glucose 65 - 139 mg/dL 81  83  99   BUN 7 - 25 mg/dL _0 Creatinine 0.50 - 1.03 mg/dL 0.63  0.59  0.78   Sodium 135 - 146 mmol/L 141  143  141   Potassium 3.5 - 5.3 mmol/L 4.2  4.1  4.1   Chloride 98 - 110 mmol/L 105  107  104   CO2 20 - 32 mmol/L 26  23  33   Calcium 8.6 - 10.4 mg/dL 9.8  9.6  9.7   Total Protein 6.1 - 8.1 g/dL 7.3  6.8  7.2   Total Bilirubin 0.2 - 1.2 mg/dL 0.5  0.4  0.3   AST 10 - 35 U/L _1 ALT 6 - 29 U/L _2 RADIOGRAPHIC STUDIES: I have personally reviewed the radiological images as listed and agreed with the findings in the report. MR Abdomen W Wo Contrast  Result Date: 02/23/2022 CLINICAL DATA:  Follow-up pancreatic cyst EXAM: MRI ABDOMEN WITHOUT AND WITH CONTRAST TECHNIQUE: Multiplanar multisequence MR imaging of the abdomen was performed both before and after the administration of intravenous contrast. CONTRAST:  9 cc of Vueway COMPARISON:  Multiple priors including most recent CT of the abdomen pelvis dated February 07, 2019. FINDINGS: Lower chest: No acute abnormality.  Bilateral breast prostheses. Hepatobiliary: Mild diffuse hepatic steatosis. No suspicious hepatic lesion. Gallbladder is unremarkable. No biliary ductal dilation.  Pancreas: Well-circumscribed lobular septated lesion in the pancreatic body/tail measures 20 x 19 x 14 mm on images 12/4 and 18/3 lesion demonstrates probable ductal communication without suspicious postcontrast enhancement. Previously the  lesion measured 12 x 9 x 9 mm when remeasured for consistency. No pancreatic ductal dilation or evidence of acute inflammation. Spleen:  No splenomegaly or focal splenic lesion. Adrenals/Urinary Tract: Bilateral adrenal glands appear normal. No hydronephrosis. No solid enhancing renal mass. Kidneys demonstrate symmetric enhancement. Stomach/Bowel: Small hiatal hernia. No evidence of bowel obstruction. Vascular/Lymphatic: Normal caliber abdominal aorta. Smooth IVC contours. The portal, splenic and superior mesenteric veins are patent. Other:  No significant abdominal free fluid. Musculoskeletal: Enhancing lesion in the right hemi-sacrum/L5 posterior elements only well seen on coronal postcontrast pulse sequence measuring 5.0 x 3.2 cm on image 34/23, with probable involvement of the right L5-S1 nerve root for instance on image 30/23. Additional small enhancing lesions in the left hemi-sacrum and multiple vertebral bodies for instance in the left hemi-sacrum measuring 10 mm on image 30/23. IMPRESSION: 1. Increased size of the well-circumscribed lobular septated cystic 2 cm lesion in the pancreatic body/tail which demonstrates probable ductal communication without suspicious postcontrast enhancement. 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 Consider further evaluation with EUS/FNA given the interval growth between studies otherwise follow up pre and post contrast MRI/MRCP in 6 months is suggested. 2. Enhancing 5.0 x 3.2 cm lesion in the right hemi-sacrum/L5 posterior elements with probable involvement of the right L5-S1 nerve root. As well as additional small enhancing lesions in the left hemi-sacrum and multiple vertebral bodies. Findings are concerning for osseous metastatic disease. Suggest more definitive characterization by MRI with and without contrast. 3. Mild diffuse hepatic steatosis. These results will be called to the ordering clinician or representative by the Radiologist Assistant, and  communication documented in the PACS or [employer]. Electronically Signed   By: Dahlia Bailiff M.D.   On: 02/23/2022 17:13    ASSESSMENT & PLAN ***  No orders of the defined types were placed in this encounter.   All questions were answered. The patient knows to call the clinic with any problems, questions or concerns.  I have spent a total of {CHL ONC TIME VISIT - MPNTI:[id number]} minutes of face-to-face and non-face-to-face time, preparing to see the patient, obtaining and/or reviewing separately obtained history, performing a medically appropriate examination, counseling and educating the patient, ordering medications/tests/procedures, referring and communicating with other health care professionals, documenting clinical information in the electronic health record, independently interpreting results and communicating results to the patient, and care coordination.   Sherol Dade PA-C Department of Hematology/Oncology [place] at [facility] Phone: [phone]

## 2022-03-03 ENCOUNTER — Inpatient Hospital Stay: Payer: Medicare Other

## 2022-03-03 ENCOUNTER — Other Ambulatory Visit: Payer: Self-pay

## 2022-03-03 ENCOUNTER — Inpatient Hospital Stay: Payer: Medicare Other | Attending: Physician Assistant | Admitting: Physician Assistant

## 2022-03-03 VITALS — BP 132/65 | HR 77 | Temp 98.2°F | Resp 18 | Wt 180.3 lb

## 2022-03-03 DIAGNOSIS — K862 Cyst of pancreas: Secondary | ICD-10-CM

## 2022-03-03 DIAGNOSIS — Z803 Family history of malignant neoplasm of breast: Secondary | ICD-10-CM | POA: Diagnosis not present

## 2022-03-03 DIAGNOSIS — Z90710 Acquired absence of both cervix and uterus: Secondary | ICD-10-CM | POA: Diagnosis not present

## 2022-03-03 DIAGNOSIS — M899 Disorder of bone, unspecified: Secondary | ICD-10-CM | POA: Diagnosis not present

## 2022-03-03 DIAGNOSIS — I10 Essential (primary) hypertension: Secondary | ICD-10-CM | POA: Insufficient documentation

## 2022-03-03 DIAGNOSIS — D378 Neoplasm of uncertain behavior of other specified digestive organs: Secondary | ICD-10-CM | POA: Insufficient documentation

## 2022-03-03 DIAGNOSIS — R1909 Other intra-abdominal and pelvic swelling, mass and lump: Secondary | ICD-10-CM | POA: Diagnosis not present

## 2022-03-03 DIAGNOSIS — Z853 Personal history of malignant neoplasm of breast: Secondary | ICD-10-CM | POA: Insufficient documentation

## 2022-03-03 LAB — CMP (CANCER CENTER ONLY)
ALT: 15 U/L (ref 0–44)
AST: 14 U/L — ABNORMAL LOW (ref 15–41)
Albumin: 4 g/dL (ref 3.5–5.0)
Alkaline Phosphatase: 70 U/L (ref 38–126)
Anion gap: 8 (ref 5–15)
BUN: 13 mg/dL (ref 6–20)
CO2: 26 mmol/L (ref 22–32)
Calcium: 9.4 mg/dL (ref 8.9–10.3)
Chloride: 107 mmol/L (ref 98–111)
Creatinine: 0.63 mg/dL (ref 0.44–1.00)
GFR, Estimated: >60 mL/min (ref >60)
Glucose, Bld: 93 mg/dL (ref 70–99)
Potassium: 3.9 mmol/L (ref 3.5–5.1)
Sodium: 141 mmol/L (ref 135–145)
Total Bilirubin: 0.4 mg/dL (ref 0.3–1.2)
Total Protein: 7.8 g/dL (ref 6.5–8.1)

## 2022-03-03 LAB — CBC WITH DIFFERENTIAL (CANCER CENTER ONLY)
Abs Immature Granulocytes: 0.02 10*3/uL (ref 0.00–0.07)
Basophils Absolute: 0 10*3/uL (ref 0.0–0.1)
Basophils Relative: 0 %
Eosinophils Absolute: 0.1 10*3/uL (ref 0.0–0.5)
Eosinophils Relative: 2 %
HCT: 33 % — ABNORMAL LOW (ref 36.0–46.0)
Hemoglobin: 10.9 g/dL — ABNORMAL LOW (ref 12.0–15.0)
Immature Granulocytes: 0 %
Lymphocytes Relative: 19 %
Lymphs Abs: 1.3 10*3/uL (ref 0.7–4.0)
MCH: 29.6 pg (ref 26.0–34.0)
MCHC: 33 g/dL (ref 30.0–36.0)
MCV: 89.7 fL (ref 80.0–100.0)
Monocytes Absolute: 0.6 10*3/uL (ref 0.1–1.0)
Monocytes Relative: 9 %
Neutro Abs: 4.8 10*3/uL (ref 1.7–7.7)
Neutrophils Relative %: 70 %
Platelet Count: 277 10*3/uL (ref 150–400)
RBC: 3.68 MIL/uL — ABNORMAL LOW (ref 3.87–5.11)
RDW: 13.2 % (ref 11.5–15.5)
WBC Count: 6.8 10*3/uL (ref 4.0–10.5)
nRBC: 0 % (ref 0.0–0.2)

## 2022-03-04 ENCOUNTER — Telehealth: Payer: Self-pay

## 2022-03-04 LAB — KAPPA/LAMBDA LIGHT CHAINS
Kappa free light chain: 1263 mg/L — ABNORMAL HIGH (ref 3.3–19.4)
Kappa, lambda light chain ratio: 203.71 — ABNORMAL HIGH (ref 0.26–1.65)
Lambda free light chains: 6.2 mg/L (ref 5.7–26.3)

## 2022-03-04 NOTE — Telephone Encounter (Signed)
This RN called patient to notify her of scheduled MRI at [place] on [facility] 03/26/2022 at 1140. Patient confirmed date, time and location of scan.

## 2022-03-05 LAB — CANCER ANTIGEN 19-9: CA 19-9: 12 U/mL (ref 0–35)

## 2022-03-05 LAB — CA 125: Cancer Antigen (CA) 125: 5.5 U/mL (ref 0.0–38.1)

## 2022-03-06 ENCOUNTER — Telehealth: Payer: Self-pay | Admitting: Physician Assistant

## 2022-03-06 NOTE — Telephone Encounter (Signed)
I called patient to discuss lab results. With her elevated SFLC the ddx is likely a plasma disorder more specifically plasmacytoma vs plasmacytoma and multiple myeloma. I discussed with patient next steps would be bone marrow biopsy for definitive diagnosis. Patient admits to feeling overwhelmed with the possibility she has cancer again. Emotional support provided. Patient request that I let her PCP know the results and she will make a decision once she is able to speak with her. Message sent to PCP who contacted patient. Patient is now agreeable to proceed with next steps and is asking for the biopsy not to be ordered until after Christmas. Plan to order CT guided biopsy after the holiday.

## 2022-03-10 ENCOUNTER — Other Ambulatory Visit: Payer: Self-pay | Admitting: Physician Assistant

## 2022-03-10 DIAGNOSIS — M899 Disorder of bone, unspecified: Secondary | ICD-10-CM

## 2022-03-11 ENCOUNTER — Other Ambulatory Visit: Payer: Self-pay | Admitting: Physician Assistant

## 2022-03-11 DIAGNOSIS — M899 Disorder of bone, unspecified: Secondary | ICD-10-CM

## 2022-03-12 ENCOUNTER — Encounter: Payer: Self-pay | Admitting: Physician Assistant

## 2022-03-12 NOTE — Progress Notes (Signed)
Arne Cleveland, MD  Donita Brooks D Needs lumbosacral CT or MR to determine best biopsy approach DDH       Previous Messages

## 2022-03-12 NOTE — Progress Notes (Signed)
Arne Cleveland, MD  Tamala Bari for CT bone marrow biopsy  the L5 lesion is incompletely eval on the MR abd Would need CT or MR Lumbosacral spine to better assess lesion for biopsy planning Then we can do biopsy and bone marrow on same day Thx DDH

## 2022-03-13 ENCOUNTER — Encounter: Payer: Self-pay | Admitting: Physician Assistant

## 2022-03-14 ENCOUNTER — Other Ambulatory Visit: Payer: Self-pay | Admitting: Internal Medicine

## 2022-03-17 LAB — PROTEIN ELECTROPHORESIS, SERUM

## 2022-03-20 ENCOUNTER — Other Ambulatory Visit: Payer: Self-pay | Admitting: Physician Assistant

## 2022-03-20 DIAGNOSIS — M899 Disorder of bone, unspecified: Secondary | ICD-10-CM

## 2022-03-23 ENCOUNTER — Other Ambulatory Visit: Payer: Self-pay | Admitting: Physician Assistant

## 2022-03-24 ENCOUNTER — Telehealth: Payer: Self-pay

## 2022-03-24 NOTE — Telephone Encounter (Signed)
Patient returned this RN's voicemail regarding 24hr urine sample. Patient reports that she forgot to pick up the collection container yesterday, patient declined to pick container up today due to inclement weather. Patient also declined to pick container up tomorrow, 1/10. Patient agreed to pick container up Monday, 1/15 and to drop off specimen to [facility] lab on Wednesday, 1/18 for testing.

## 2022-03-26 ENCOUNTER — Ambulatory Visit
Admission: RE | Admit: 2022-03-26 | Discharge: 2022-03-26 | Disposition: A | Discharge status: Home / Self Care | Payer: Medicare Other | Source: Ambulatory Visit | Attending: Physician Assistant | Admitting: Physician Assistant

## 2022-03-26 DIAGNOSIS — M899 Disorder of bone, unspecified: Secondary | ICD-10-CM

## 2022-03-26 DIAGNOSIS — R937 Abnormal findings on diagnostic imaging of other parts of musculoskeletal system: Secondary | ICD-10-CM | POA: Diagnosis not present

## 2022-03-26 DIAGNOSIS — S32050A Wedge compression fracture of fifth lumbar vertebra, initial encounter for closed fracture: Secondary | ICD-10-CM | POA: Diagnosis not present

## 2022-03-26 MED ORDER — GADOPICLENOL 0.5 MMOL/ML IV SOLN
8.0000 mL | Freq: Once | INTRAVENOUS | Status: AC | PRN
  Administered 2022-03-26: 8 mL via INTRAVENOUS

## 2022-03-30 ENCOUNTER — Telehealth: Payer: Self-pay | Admitting: Internal Medicine

## 2022-03-30 ENCOUNTER — Inpatient Hospital Stay: Payer: Medicare Other | Attending: Physician Assistant | Admitting: Physician Assistant

## 2022-03-30 VITALS — BP 92/73 | HR 77 | Temp 98.6°F | Resp 18 | Wt 179.2 lb

## 2022-03-30 DIAGNOSIS — Z803 Family history of malignant neoplasm of breast: Secondary | ICD-10-CM | POA: Diagnosis not present

## 2022-03-30 DIAGNOSIS — Z853 Personal history of malignant neoplasm of breast: Secondary | ICD-10-CM | POA: Insufficient documentation

## 2022-03-30 DIAGNOSIS — M549 Dorsalgia, unspecified: Secondary | ICD-10-CM | POA: Diagnosis not present

## 2022-03-30 DIAGNOSIS — R200 Anesthesia of skin: Secondary | ICD-10-CM | POA: Insufficient documentation

## 2022-03-30 DIAGNOSIS — M459 Ankylosing spondylitis of unspecified sites in spine: Secondary | ICD-10-CM | POA: Insufficient documentation

## 2022-03-30 DIAGNOSIS — K219 Gastro-esophageal reflux disease without esophagitis: Secondary | ICD-10-CM | POA: Insufficient documentation

## 2022-03-30 DIAGNOSIS — D378 Neoplasm of uncertain behavior of other specified digestive organs: Secondary | ICD-10-CM | POA: Insufficient documentation

## 2022-03-30 DIAGNOSIS — R202 Paresthesia of skin: Secondary | ICD-10-CM | POA: Diagnosis not present

## 2022-03-30 DIAGNOSIS — E559 Vitamin D deficiency, unspecified: Secondary | ICD-10-CM | POA: Insufficient documentation

## 2022-03-30 DIAGNOSIS — C9000 Multiple myeloma not having achieved remission: Secondary | ICD-10-CM | POA: Insufficient documentation

## 2022-03-30 DIAGNOSIS — M899 Disorder of bone, unspecified: Secondary | ICD-10-CM | POA: Diagnosis not present

## 2022-03-30 DIAGNOSIS — G8929 Other chronic pain: Secondary | ICD-10-CM

## 2022-03-30 DIAGNOSIS — Z79899 Other long term (current) drug therapy: Secondary | ICD-10-CM | POA: Diagnosis not present

## 2022-03-30 DIAGNOSIS — I10 Essential (primary) hypertension: Secondary | ICD-10-CM | POA: Diagnosis not present

## 2022-03-30 DIAGNOSIS — M5459 Other low back pain: Secondary | ICD-10-CM | POA: Insufficient documentation

## 2022-03-30 MED ORDER — MORPHINE SULFATE ER 15 MG PO TBCR: 15.0000 mg | EXTENDED_RELEASE_TABLET | Freq: Two times a day (BID) | ORAL | 0 refills | Status: AC

## 2022-03-30 MED ORDER — ONDANSETRON HCL 4 MG PO TABS: 4.0000 mg | ORAL_TABLET | Freq: Three times a day (TID) | ORAL | 0 refills | Status: DC | PRN

## 2022-03-30 NOTE — Telephone Encounter (Signed)
Nisha Dhami [phone]  Sonnia called to say she is really hurting on the right side of her back / side with some numbness like she was when she went to the emergency room in August and seen you. Do you want me to have her come in at 12:30 or 2:30 today?

## 2022-03-30 NOTE — Progress Notes (Signed)
[place] [phone]   [phone]  INITIAL CONSULTATION:  Patient Care Team: Elby Showers, MD as PCP - General (Internal Medicine)  CHIEF COMPLAINTS: back pain  HISTORY OF PRESENTING ILLNESS:  Sonya Franklin 57 y.o. female with history of breast cancer currently undergoing workup for bone lesion found on MR abdomen presenting to clinic today to discuss results of MR lumbar and pelvis as well as her back pain.  Patient states she has had back pain for several years because of her ankylosing spondylitis.  She states for the last approximately 2 weeks she had has worsening pain in her back radiating down her right leg.  She describes the pain as aching and stiffness.  This pain is unchanged since her initial diagnostic clinic appointment 03/03/2022.  She admits she continues to have intermittent numbness and tingling in her feet.  She has not experienced any falls.  She has ibuprofen which she takes for pain although feels like it has not provided enough relief over the last week.  She denies any bowel or bladder incontinence or retention.  She rates her pain a 7 out of 10 severity.  Pain is worse with movement.  She is eager to discuss the MRI results and figure out next steps.     MEDICAL HISTORY:  Past Medical History:  Diagnosis Date   Ankylosing spondylitis ([place])    Anxiety    Arthritis    Breast cancer ([place])    right breast   Bursitis of hip, right    Cardiac arrhythmia    Carotid stenosis 08/26/2021   GE reflux    Hiatal hernia    Hypertension    Hyperthyroidism    Ischemic bowel disease ([place])    Prediabetes 08/26/2021   Snoring 03/21/2014   Vitamin D deficiency     SURGICAL HISTORY: Past Surgical History:  Procedure Laterality Date   ABDOMINAL HYSTERECTOMY     BREAST LUMPECTOMY     right   MASTECTOMY     double    SOCIAL HISTORY: Social History   Socioeconomic History   Marital status: Legally  Separated    Spouse name: Not on file   Number of children: 1   Years of education: Not on file   Highest education level: Not on file  Occupational History    Employer: UNEMPLOYED  Tobacco Use   Smoking status: Never   Smokeless tobacco: Never  Vaping Use   Vaping Use: Never used  Substance and Sexual Activity   Alcohol use: No   Drug use: No   Sexual activity: Yes  Other Topics Concern   Not on file  Social History Narrative   Caffeine occasional drinker.   Not working/disabled.  12th grader. Divorced, one kids.       Social history: She receives disability benefits for history of breast cancer 2007 and ankylosing spondylitis.  She resides with special needs son.  She is divorced.  Non-smoker.  No alcohol consumption.       Family history: Father with history of prostate cancer.  Patient's mother was diagnosed with breast cancer at age 15.  Maternal aunt with history of breast cancer.  3 brothers.   Right handed   Caffeine occas   One level stays on this level       Social Determinants of Health   Financial Resource Strain: Low Risk  (08/26/2021)   Overall Financial Resource Strain (CARDIA)    Difficulty of Paying Living  Expenses: Not hard at all  Food Insecurity: No Food Insecurity (08/26/2021)   Hunger Vital Sign    Worried About Running Out of Food in the Last Year: Never true    Ran Out of Food in the Last Year: Never true  Transportation Needs: No Transportation Needs (08/26/2021)   PRAPARE - Hydrologist (Medical): No    Lack of Transportation (Non-Medical): No  Physical Activity: Inactive (08/26/2021)   Exercise Vital Sign    Days of Exercise per Week: 0 days    Minutes of Exercise per Session: 0 min  Stress: Not on file  Social Connections: Not on file  Intimate Partner Violence: Not on file    FAMILY HISTORY: Family History  Problem Relation Age of Onset   Diabetes Mother    Hypertension Mother    Hyperlipidemia Mother     High blood pressure Father    High Cholesterol Father    Colitis Brother    Goiter Maternal Grandmother    Breast cancer Maternal Aunt     ALLERGIES:  is allergic to shellfish allergy, blue dyes (parenteral), and hydrochlorothiazide.  MEDICATIONS:  Current Outpatient Medications  Medication Sig Dispense Refill   morphine (MS CONTIN) 15 MG 12 hr tablet Take 1 tablet (15 mg total) by mouth every 12 (twelve) hours for 5 days. 10 tablet 0   ondansetron (ZOFRAN) 4 MG tablet Take 1 tablet (4 mg total) by mouth every 8 (eight) hours as needed for nausea or vomiting. 20 tablet 0   ALPRAZolam (XANAX) 0.25 MG tablet TAKE 1 TABLET (0.25 MG TOTAL) BY MOUTH 2 (TWO) TIMES DAILY AS NEEDED FOR ANXIETY. 60 tablet 5   amLODipine (NORVASC) 5 MG tablet TAKE 1 TABLET (5 MG TOTAL) BY MOUTH DAILY. 90 tablet 2   DULoxetine (CYMBALTA) 30 MG capsule Take 2 capsules (60 mg total) by mouth daily. Take 30 mg (1 tablet), if no side effects after 1 week, increase to 60 mg (2 tablets) thereafter 60 capsule 5   furosemide (LASIX) 20 MG tablet Take 1 tablet (20 mg total) by mouth daily as needed. 10 tablet 1   hyoscyamine (ANASPAZ) 0.125 MG TBDP disintergrating tablet Place 1 tablet (0.125 mg total) under the tongue every 4 (four) hours as needed for cramping. 30 tablet 0   ibuprofen (ADVIL) 800 MG tablet TAKE 1 TABLET BY MOUTH EVERY 8 HOURS AS NEEDED FOR PAIN 180 tablet 3   olopatadine (PATANOL) 0.1 % ophthalmic solution Place 1 drop into both eyes 2 (two) times daily as needed. 5 mL PRN   pantoprazole (PROTONIX) 40 MG tablet TAKE 1 TABLET BY MOUTH EVERY DAY 90 tablet 3   saccharomyces boulardii (FLORASTOR) 250 MG capsule Take 1 capsule (250 mg total) by mouth 2 (two) times daily. (Patient taking differently: Take 250 mg by mouth as needed.) 40 capsule 0   triamcinolone cream (KENALOG) 0.1 % Apply 1 Application topically 3 (three) times daily. 30 g 1   No current facility-administered medications for this visit.     REVIEW OF SYSTEMS:   All other systems are reviewed and are negative for acute change except as noted in the HPI.   PHYSICAL EXAMINATION: ECOG PERFORMANCE STATUS: 1 - Symptomatic but completely ambulatory  Vitals:   03/30/22 1100  BP: 92/73  Pulse: 77  Resp: 18  Temp: 98.6 F (37 C)  SpO2: 100%   Filed Weights   03/30/22 1100  Weight: 179 lb 3.2 oz (81.3 kg)  GENERAL: well appearing  in NAD  SKIN: skin color, texture, turgor are normal, no rashes or significant lesions EYES: conjunctiva are pink and non-injected, sclera clear OROPHARYNX: no exudate, no erythema; lips, buccal mucosa, and tongue normal  NECK: supple, non-tender LYMPH:  no palpable lymphadenopathy in the cervical, axillary or supraclavicular lymph nodes.  LUNGS: clear to auscultation and percussion with normal breathing effort HEART: regular rate & rhythm and no murmurs and no lower extremity edema ABDOMEN: soft, non-tender, non-distended, normal bowel sounds Musculoskeletal: no cyanosis of digits and no clubbing. Point tenderness over L5. No paraspinal tenderness. PSYCH: alert & oriented x 3, fluent speech NEURO: no focal motor deficits. Ambulatory with normal gait. Subjective decreased sensation to bilateral feet.  No saddle anesthesia  LABORATORY DATA:  I have reviewed the data as listed    Latest Ref Rng & Units 03/03/2022   12:56 PM 10/30/2021   12:38 PM 09/18/2021   12:31 PM  CBC  WBC 4.0 - 10.5 K/uL 6.8  5.7  6.1   Hemoglobin 12.0 - 15.0 g/dL 10.9  11.4  11.2   Hematocrit 36.0 - 46.0 % 33.0  34.6  33.1   Platelets 150 - 400 K/uL 277  278  266        Latest Ref Rng & Units 03/03/2022   12:56 PM 10/30/2021   12:38 PM 09/18/2021   12:31 PM  CMP  Glucose 70 - 99 mg/dL 93  81  83   BUN 6 - 20 mg/dL '13  13  12   '$ Creatinine 0.44 - 1.00 mg/dL 0.63  0.63  0.59   Sodium 135 - 145 mmol/L 141  141  143   Potassium 3.5 - 5.1 mmol/L 3.9  4.2  4.1   Chloride 98 - 111 mmol/L 107  105  107   CO2 22 -  32 mmol/L '26  26  23   '$ Calcium 8.9 - 10.3 mg/dL 9.4  9.8  9.6   Total Protein 6.5 - 8.1 g/dL 7.8  7.3  6.8   Total Bilirubin 0.3 - 1.2 mg/dL 0.4  0.5  0.4   Alkaline Phos 38 - 126 U/L 70     AST 15 - 41 U/L '14  13  14   '$ ALT 0 - 44 U/L '15  15  15      '$ RADIOGRAPHIC STUDIES: I have personally reviewed the radiological images as listed and agreed with the findings in the report. MR Pelvis W Wo Contrast  Result Date: 03/27/2022 CLINICAL DATA:  Metastatic disease evaluation. EXAM: MRI PELVIS WITHOUT AND WITH CONTRAST TECHNIQUE: Multiplanar multisequence MR imaging of the pelvis was performed both before and after administration of intravenous contrast. CONTRAST:  8 mL Vueway COMPARISON:  None Available. FINDINGS: Bones: No hip fracture, dislocation or avascular necrosis. Diffuse heterogeneous marrow signal abnormality concerning for diffuse osseous metastatic disease. Focal tumor replacement of the right-side of the L5 vertebral body extending into the right L5 pedicle and posterior elements with a small extraosseous component extending into the right L5 neural foramen with moderate-severe stenosis. Pathologic compression fracture of the L5 vertebral body with approximately 35% height loss. Moderate bilateral facet arthropathy at L5-S1. Mild osteoarthritis of bilateral SI joints. Articular cartilage and labrum Articular cartilage: Mild bilateral chondral thinning. No focal chondral defect. Labrum: Grossly intact, but evaluation is limited by lack of intraarticular fluid. Joint or bursal effusion Joint effusion:  No hip joint effusion.  No SI joint effusion. Bursae:  No bursal fluid. Muscles and tendons  Flexors: Normal. Extensors: Normal. Abductors: Normal. Adductors: Normal. Gluteals: Normal. Hamstrings: Normal. Other findings No pelvic free fluid. No fluid collection or hematoma. No inguinal lymphadenopathy. No inguinal hernia. IMPRESSION: 1. Diffuse heterogeneous marrow signal abnormality concerning for  diffuse osseous metastatic disease. 2. Focal tumor replacement of the right-side of the L5 vertebral body extending into the right L5 pedicle and posterior elements with a small extraosseous component extending into the right L5 neural foramen with moderate-severe stenosis. Pathologic compression fracture of the L5 vertebral body with approximately 35% height loss. Electronically Signed   By: Kathreen Devoid M.D.   On: 03/27/2022 10:28   MR Lumbar Spine W Wo Contrast  Result Date: 03/27/2022 CLINICAL DATA:  57 year old female with breast cancer. Evidence of osseous metastatic disease on abdomen MRI last month. EXAM: MRI LUMBAR SPINE WITHOUT AND WITH CONTRAST TECHNIQUE: Multiplanar and multiecho pulse sequences of the lumbar spine were obtained without and with intravenous contrast. CONTRAST:  8 mL Vueway COMPARISON:  Previous lumbar MRI 08/18/2016. Abdomen MRI 02/23/2022. FINDINGS: Segmentation: Lumbar segmentation appears to be normal and will be designated as such for this report. Alignment: Relatively stable lumbar lordosis since 2018. Mild grade 1 anterolisthesis of L5 on S1 has not significantly changed and appears to be degenerative in nature. Vertebrae: Widespread abnormal marrow signal and enhancement throughout the visible spine, sacrum. Subtotal tumor replacement of the central L5 vertebral body, right L5 pedicle and posterior elements. Extraosseous tumor there (series 6, image 33) invading the right L5 neural foramen with moderate or severe impingement of the exiting nerve. And mild pathologic compression fracture of the L5 body (loss of height up to 35%. Tumor extension into the L5 S1 disc (series 9, image 8). Miliary pattern of enhancing metastatic disease elsewhere throughout the visible spine and sacrum. Next largest lumbar metastases are about 9-10 mm in the L1 and L2 vertebral bodies. No other pathologic fracture identified. Conus medullaris and cauda equina: Conus extends to the L1 level. No lower  spinal cord or conus signal abnormality. No lower thoracic spinal cord leptomeningeal enhancement, but punctate foci of abnormal enhancement of the cauda equina (series 9, images 8 and 9) is highly suspicious for early nerve root metastatic disease. Paraspinal and other soft tissues: Visible abdominal viscera are stable from last month. Disc levels: Mild for age superimposed lower thoracic and lumbar spine degeneration. No degenerative spinal stenosis. No epidural tumor above the L5 level. Right L5 foraminal tumor. And early ventral epidural tumor which is enhancing on series 9, image 9. IMPRESSION: 1. Positive for diffuse osseous metastatic disease throughout the visible spine and sacrum. Subtotal tumor replacement of the central L5 vertebral body and right L5 posterior elements with mild pathologic compression fracture and extraosseous tumor in the right L5 neural foramen (moderate to severe neural impingement), also early involvement of the ventral epidural space. 2. Punctate abnormal enhancement of the cauda equina nerve roots highly suspicious for early leptomeningeal metastases. No definite involvement of the lower thoracic spinal cord or conus. Electronically Signed   By: Genevie Ann M.D.   On: 03/27/2022 07:23    ASSESSMENT & PLAN ANGALA HILGERS is a 57 y.o. female with oncologic history of breast cancer undergoing work up for incidental finding of sacral lesion on abdominal imaging.  #) Bone lesions -Suspicious for multiple myeloma vs plasmacytoma vs breast cancer recurrence.  -Went over MR results in detail with patient discussed next step being biopsy to help determine best treatment options. -IR is currently reviewing imaging to help  with biopsy planning.  They will reach out to patient to schedule biopsy. -Collected additional breast tumor markers today.   #) Back pain -Compression fracture at L5 likely contributing to pain vs her known ankylosing spondylitis. -Exam today is without frank  neuro dysfunction.  No bowel or bladder issues.she has point tenderness over L5, decreased sensation in lower extremities, with normal gait. Exam does not indicated cauda equina -I have reviewed the PDMP during this encounter. Patient does not have have recent narcotic prescription.  -Patient is hesitant to take narcotic pain medication.  We had a lengthy discussion about this and how to safely manage her pain.  She has taken morphine in the past and tolerated it well.  Prescription sent to the pharmacy for morphine for uncontrollable pain.  She is worried to be nauseous while on the pain medicine, Zofran sent to pharmacy as well.  Encouraged her to take with food as well. -Dr. Lorenso Courier is agreeable with plan of care.  Strict ED precautions discussed should symptoms worsen.   Orders Placed This Encounter  Procedures   CA 27.29    Standing Status:   Future    Number of Occurrences:   1    Standing Expiration Date:   03/30/2023   CA 15.3    Standing Status:   Future    Number of Occurrences:   1    Standing Expiration Date:   03/30/2023    All questions were answered. The patient knows to call the clinic with any problems, questions or concerns.  I have spent a total of 30 minutes minutes of face-to-face and non-face-to-face time, preparing to see the patient, obtaining and/or reviewing separately obtained history, performing a medically appropriate examination, counseling and educating the patient, ordering medications/tests/procedures, referring and communicating with other health care professionals, documenting clinical information in the electronic health record, independently interpreting results and communicating results to the patient, and care coordination.   Sherol Dade PA-C Department of Hematology/Oncology [place] [facility] Phone: [phone]

## 2022-03-30 NOTE — Telephone Encounter (Signed)
Just spoke with Sonya Franklin and she is at oncology office right now.

## 2022-03-31 DIAGNOSIS — M459 Ankylosing spondylitis of unspecified sites in spine: Secondary | ICD-10-CM | POA: Diagnosis not present

## 2022-03-31 DIAGNOSIS — C9000 Multiple myeloma not having achieved remission: Secondary | ICD-10-CM | POA: Diagnosis not present

## 2022-03-31 DIAGNOSIS — I10 Essential (primary) hypertension: Secondary | ICD-10-CM | POA: Diagnosis not present

## 2022-03-31 DIAGNOSIS — K219 Gastro-esophageal reflux disease without esophagitis: Secondary | ICD-10-CM | POA: Diagnosis not present

## 2022-03-31 DIAGNOSIS — Z79899 Other long term (current) drug therapy: Secondary | ICD-10-CM | POA: Diagnosis not present

## 2022-03-31 DIAGNOSIS — R200 Anesthesia of skin: Secondary | ICD-10-CM | POA: Diagnosis not present

## 2022-03-31 DIAGNOSIS — E559 Vitamin D deficiency, unspecified: Secondary | ICD-10-CM | POA: Diagnosis not present

## 2022-03-31 DIAGNOSIS — G4733 Obstructive sleep apnea (adult) (pediatric): Secondary | ICD-10-CM | POA: Diagnosis not present

## 2022-03-31 DIAGNOSIS — R202 Paresthesia of skin: Secondary | ICD-10-CM | POA: Diagnosis not present

## 2022-03-31 DIAGNOSIS — Z853 Personal history of malignant neoplasm of breast: Secondary | ICD-10-CM | POA: Diagnosis not present

## 2022-03-31 DIAGNOSIS — Z803 Family history of malignant neoplasm of breast: Secondary | ICD-10-CM | POA: Diagnosis not present

## 2022-03-31 LAB — CANCER ANTIGEN 27.29: CA 27.29: <9 U/mL (ref 0.0–38.6)

## 2022-03-31 NOTE — Progress Notes (Signed)
Sonya Edouard, MD  Donita Brooks D OK for right sacral mass biopsy and BM bx at same time. Must be done under CT guidance. Double bx slot.  GY

## 2022-04-01 ENCOUNTER — Ambulatory Visit: Payer: Medicare Other | Admitting: Physician Assistant

## 2022-04-01 ENCOUNTER — Inpatient Hospital Stay: Payer: Medicare Other

## 2022-04-01 ENCOUNTER — Other Ambulatory Visit: Payer: Self-pay

## 2022-04-01 DIAGNOSIS — M899 Disorder of bone, unspecified: Secondary | ICD-10-CM

## 2022-04-01 LAB — CANCER ANTIGEN 15-3: CA 15-3: 8.8 U/mL (ref 0.0–25.0)

## 2022-04-03 LAB — UPEP/UIFE/LIGHT CHAINS/TP, 24-HR UR
% BETA, Urine: 17 %
ALPHA 1 URINE: 5.2 %
Albumin, U: 37.6 %
Alpha 2, Urine: 14.5 %
Free Kappa Lt Chains,Ur: 72.79 mg/L (ref 1.17–86.46)
Free Kappa/Lambda Ratio: 20.45 — ABNORMAL HIGH (ref 1.83–14.26)
Free Lambda Lt Chains,Ur: 3.56 mg/L (ref 0.27–15.21)
GAMMA GLOBULIN URINE: 25.7 %
M-SPIKE %, Urine: 6.4 % — ABNORMAL HIGH
M-Spike, Mg/24 Hr: 8 mg/24 hr — ABNORMAL HIGH
Total Protein, Urine-Ur/day: 130 mg/24 hr (ref 30–150)
Total Protein, Urine: 9.6 mg/dL
Total Volume: 1350

## 2022-04-13 ENCOUNTER — Telehealth: Payer: Self-pay

## 2022-04-13 NOTE — Telephone Encounter (Signed)
Patient called [facility] number regarding information for her upcoming bone marrow biopsy scheduled for 04/15/22. Patient verbalized understanding of time and location of appointment.

## 2022-04-14 ENCOUNTER — Other Ambulatory Visit: Payer: Self-pay | Admitting: Radiology

## 2022-04-14 ENCOUNTER — Other Ambulatory Visit: Payer: Medicare Other

## 2022-04-14 DIAGNOSIS — R7302 Impaired glucose tolerance (oral): Secondary | ICD-10-CM

## 2022-04-14 DIAGNOSIS — E7800 Pure hypercholesterolemia, unspecified: Secondary | ICD-10-CM

## 2022-04-14 DIAGNOSIS — R7989 Other specified abnormal findings of blood chemistry: Secondary | ICD-10-CM

## 2022-04-14 DIAGNOSIS — I10 Essential (primary) hypertension: Secondary | ICD-10-CM

## 2022-04-14 DIAGNOSIS — M533 Sacrococcygeal disorders, not elsewhere classified: Secondary | ICD-10-CM

## 2022-04-14 NOTE — Progress Notes (Shared)
Annual Wellness Visit    Patient Care Team: Elby Showers, MD as PCP - General (Internal Medicine)  Visit Date: 04/14/22   No chief complaint on file.   Subjective:   Patient: Sonya Franklin, Female    DOB: 24-May-1965, 57 y.o.   MRN: [id number]  Sonya Franklin is a 57 y.o. Female who presents today for her Annual Wellness Visit. She has a history of hypertension, hyperthyroidism, breast cancer, bursitis of right hip, carotid stenosis, cardiac arrhythmia, GERD, prediabetes, ischemic bowel disease, hiatal hernia, arthritis, ankylosing spondylitis, Vitamin D deficiency.  History of cardiac arrhythmia, carotid stenosis treated with Lasix 20 mg daily as needed.  History of hypertension treated with Norvasc 5 mg daily.  History of breast cancer. She is followed by oncology PA Sherol Dade.  History of GERD treated with Protonix 40 mg daily.  History of arthritis treated with Vicodin 5-325 mg every 4 hours as needed, Kenalog 0.1% applied 3 times daily.  History of anxiety treated with Xanax 0.25 mg twice daily as needed.  History of Ischemic bowel disease treated with Anaspaz 0.125 mg once every 4 hours as needed, Florastor 250 mg as needed.  History of hyperthyroidism  History of Vitamin D deficiency  History of prediabetes  Pap smear last completed on 01/10/2014 with normal results. Future Pap smears discontinued. Status post total abdominal hysterectomy bil salpingo oophorectomy.   Mammogram. Discontinued. Status post bilateral mastectomy.  Colonoscopy last completed 03/21/2012. Results showed normal colon, normal mucosa in the terminal ileum, small internal hemorrhoids. Recommended repeat in 2024.  Due for shingles vaccine. UTD on flu, tetanus, Covid-19 vaccines.    Past Medical History:  Diagnosis Date   Ankylosing spondylitis ([place])    Anxiety    Arthritis    Breast cancer ([place])    right breast   Bursitis of hip, right    Cardiac arrhythmia    Carotid  stenosis 08/26/2021   GE reflux    Hiatal hernia    Hypertension    Hyperthyroidism    Ischemic bowel disease ([place])    Prediabetes 08/26/2021   Snoring 03/21/2014   Vitamin D deficiency      Family History  Problem Relation Age of Onset   Diabetes Mother    Hypertension Mother    Hyperlipidemia Mother    High blood pressure Father    High Cholesterol Father    Colitis Brother    Goiter Maternal Grandmother    Breast cancer Maternal Aunt      Social History   Social History Narrative   Caffeine occasional drinker.   Not working/disabled.  12th grader. Divorced, one kids.       Social history: She receives disability benefits for history of breast cancer 2007 and ankylosing spondylitis.  She resides with special needs son.  She is divorced.  Non-smoker.  No alcohol consumption.       Family history: Father with history of prostate cancer.  Patient's mother was diagnosed with breast cancer at age 29.  Maternal aunt with history of breast cancer.  3 brothers.   Right handed   Caffeine occas   One level stays on this level         ROS    Objective:   Vitals: There were no vitals taken for this visit.  Physical Exam   Most recent functional status assessment:     No data to display         Most recent fall risk assessment:  02/25/2022   11:53 AM  Fall Risk   Falls in the past year? 0  Number falls in past yr: 0  Injury with Fall? 0  Risk for fall due to : No Fall Risks  Follow up Falls prevention discussed    Most recent depression screenings:    02/25/2022   11:53 AM 01/16/2022    3:25 PM  PHQ 2/9 Scores  PHQ - 2 Score 0 0   Most recent cognitive screening:    04/10/2021    3:17 PM  6CIT Screen  What Year? 0 points  What month? 0 points  What time? 0 points  Count back from 20 0 points  Months in reverse 0 points  Repeat phrase 0 points  Total Score 0 points     Results:   Studies obtained and personally reviewed by me:  Imaging,  colonoscopy, mammogram, bone density scan, echocardiogram, heart cath, stress test, CT calcium score, etc. ***   Labs:       Component Value Date/Time   NA 141 03/03/2022 1256   K 3.9 03/03/2022 1256   CL 107 03/03/2022 1256   CO2 26 03/03/2022 1256   GLUCOSE 93 03/03/2022 1256   BUN 13 03/03/2022 1256   CREATININE 0.63 03/03/2022 1256   CREATININE 0.63 10/30/2021 1238   CALCIUM 9.4 03/03/2022 1256   PROT 7.8 03/03/2022 1256   ALBUMIN 4.0 03/03/2022 1256   AST 14 (L) 03/03/2022 1256   ALT 15 03/03/2022 1256   ALKPHOS 70 03/03/2022 1256   BILITOT 0.4 03/03/2022 1256   GFRNONAA >60 03/03/2022 1256   GFRNONAA 108 04/05/2020 1128   GFRAA 126 04/05/2020 1128     Lab Results  Component Value Date   WBC 6.8 03/03/2022   HGB 10.9 (L) 03/03/2022   HCT 33.0 (L) 03/03/2022   MCV 89.7 03/03/2022   PLT 277 03/03/2022    Lab Results  Component Value Date   CHOL 181 07/17/2021   HDL 64 07/17/2021   LDLCALC 100 (H) 07/17/2021   TRIG 76 07/17/2021   CHOLHDL 2.8 07/17/2021    Lab Results  Component Value Date   HGBA1C 6.2 01/08/2022     Lab Results  Component Value Date   TSH 0.57 04/08/2021     No results found for: "PSA1", "PSA" *** delete for female pts  ***  Assessment & Plan:   ***      Annual wellness visit done today including the all of the following: Reviewed patient's Family Medical History Reviewed and updated list of patient's medical providers Assessment of cognitive impairment was done Assessed patient's functional ability Established a written schedule for health screening [place] Completed and Reviewed  Discussed health benefits of physical activity, and encouraged her to engage in regular exercise appropriate for her age and condition.        I,Alexander Ruley,acting as a Education administrator for Elby Showers, MD.,have documented all relevant documentation on the behalf of Elby Showers, MD,as directed by  Elby Showers, MD  while in the presence of Elby Showers, MD.   ***

## 2022-04-14 NOTE — H&P (Signed)
Chief Complaint: Patient was seen in consultation today for sacral mass biopsy and bone marrow biopsy with aspiration.   Referring Physician(s): Lincoln Brigham  Supervising Physician: {Supervising Physician:[id number]}  Patient Status: [facility] - Out-pt  History of Present Illness: Sonya Franklin is a 57 y.o. female with a medical history significant for HTN, ischemic bowel disease, reflux, ankylosing spondylitis and a remote history of right breast cancer s/p bilateral mastectomy. She presented to her PCP with complaints of worsening back pain with numbness and tingling in her feet. Imaging is suspicious for multiple myeloma vs plasmacytoma vs breast cancer recurrence.   MR Pelvis with and without contrast 03/26/22 IMPRESSION: 1. Diffuse heterogeneous marrow signal abnormality concerning for diffuse osseous metastatic disease. 2. Focal tumor replacement of the right-side of the L5 vertebral body extending into the right L5 pedicle and posterior elements with a small extraosseous component extending into the right L5 neural foramen with moderate-severe stenosis. Pathologic compression fracture of the L5 vertebral body with approximately 35% height loss.  Additional lab work showed the presence of Bence Jones proteins, elevated kappa and lambda free light chains and an M spike. Interventional Radiology has been asked to evaluate this patient for an image-guided bone marrow biopsy with aspiration and right sacral mass biopsy. Imaging reviewed and procedure approved by Dr. Kathlene Cote.   Past Medical History:  Diagnosis Date   Ankylosing spondylitis ([place])    Anxiety    Arthritis    Breast cancer ([place])    right breast   Bursitis of hip, right    Cardiac arrhythmia    Carotid stenosis 08/26/2021   GE reflux    Hiatal hernia    Hypertension    Hyperthyroidism    Ischemic bowel disease ([place])    Prediabetes 08/26/2021   Snoring 03/21/2014   Vitamin D deficiency     Past Surgical History:   Procedure Laterality Date   ABDOMINAL HYSTERECTOMY     BREAST LUMPECTOMY     right   MASTECTOMY     double    Allergies: Shellfish allergy, Blue dyes (parenteral), and Hydrochlorothiazide  Medications: Prior to Admission medications   Medication Sig Start Date End Date Taking? Authorizing Provider  ALPRAZolam (XANAX) 0.25 MG tablet TAKE 1 TABLET (0.25 MG TOTAL) BY MOUTH 2 (TWO) TIMES DAILY AS NEEDED FOR ANXIETY. 02/22/21   Elby Showers, MD  amLODipine (NORVASC) 5 MG tablet TAKE 1 TABLET (5 MG TOTAL) BY MOUTH DAILY. 03/14/22   Elby Showers, MD  DULoxetine (CYMBALTA) 30 MG capsule Take 2 capsules (60 mg total) by mouth daily. Take 30 mg (1 tablet), if no side effects after 1 week, increase to 60 mg (2 tablets) thereafter 01/08/22   Shellia Carwin, MD  furosemide (LASIX) 20 MG tablet Take 1 tablet (20 mg total) by mouth daily as needed. 08/26/21 01/08/22  Skeet Latch, MD  hyoscyamine (ANASPAZ) 0.125 MG TBDP disintergrating tablet Place 1 tablet (0.125 mg total) under the tongue every 4 (four) hours as needed for cramping. 08/22/21   Elby Showers, MD  ibuprofen (ADVIL) 800 MG tablet TAKE 1 TABLET BY MOUTH EVERY 8 HOURS AS NEEDED FOR PAIN 09/11/21   Elby Showers, MD  olopatadine (PATANOL) 0.1 % ophthalmic solution Place 1 drop into both eyes 2 (two) times daily as needed. 12/24/21   Elby Showers, MD  ondansetron (ZOFRAN) 4 MG tablet Take 1 tablet (4 mg total) by mouth every 8 (eight) hours as needed for nausea or vomiting. 03/30/22  Walisiewicz, Kaitlyn E, PA-C  pantoprazole (PROTONIX) 40 MG tablet TAKE 1 TABLET BY MOUTH EVERY DAY 12/10/21   Elby Showers, MD  saccharomyces boulardii (FLORASTOR) 250 MG capsule Take 1 capsule (250 mg total) by mouth 2 (two) times daily. Patient taking differently: Take 250 mg by mouth as needed. 07/18/16   Barton Dubois, MD  triamcinolone cream (KENALOG) 0.1 % Apply 1 Application topically 3 (three) times daily. 10/03/21   Elby Showers, MD      Family History  Problem Relation Age of Onset   Diabetes Mother    Hypertension Mother    Hyperlipidemia Mother    High blood pressure Father    High Cholesterol Father    Colitis Brother    Goiter Maternal Grandmother    Breast cancer Maternal Aunt     Social History   Socioeconomic History   Marital status: Legally Separated    Spouse name: Not on file   Number of children: 1   Years of education: Not on file   Highest education level: Not on file  Occupational History    Employer: UNEMPLOYED  Tobacco Use   Smoking status: Never   Smokeless tobacco: Never  Vaping Use   Vaping Use: Never used  Substance and Sexual Activity   Alcohol use: No   Drug use: No   Sexual activity: Yes  Other Topics Concern   Not on file  Social History Narrative   Caffeine occasional drinker.   Not working/disabled.  12th grader. Divorced, one kids.       Social history: She receives disability benefits for history of breast cancer 2007 and ankylosing spondylitis.  She resides with special needs son.  She is divorced.  Non-smoker.  No alcohol consumption.       Family history: Father with history of prostate cancer.  Patient's mother was diagnosed with breast cancer at age 42.  Maternal aunt with history of breast cancer.  3 brothers.   Right handed   Caffeine occas   One level stays on this level       Social Determinants of Health   Financial Resource Strain: Low Risk  (08/26/2021)   Overall Financial Resource Strain (CARDIA)    Difficulty of Paying Living Expenses: Not hard at all  Food Insecurity: No Food Insecurity (08/26/2021)   Hunger Vital Sign    Worried About Running Out of Food in the Last Year: Never true    Ran Out of Food in the Last Year: Never true  Transportation Needs: No Transportation Needs (08/26/2021)   PRAPARE - Hydrologist (Medical): No    Lack of Transportation (Non-Medical): No  Physical Activity: Inactive (08/26/2021)    Exercise Vital Sign    Days of Exercise per Week: 0 days    Minutes of Exercise per Session: 0 min  Stress: Not on file  Social Connections: Not on file    Review of Systems: A 12 point ROS discussed and pertinent positives are indicated in the HPI above.  All other systems are negative.  Review of Systems  Vital Signs: There were no vitals taken for this visit.  Physical Exam  Imaging: MR Pelvis W Wo Contrast  Result Date: 03/27/2022 CLINICAL DATA:  Metastatic disease evaluation. EXAM: MRI PELVIS WITHOUT AND WITH CONTRAST TECHNIQUE: Multiplanar multisequence MR imaging of the pelvis was performed both before and after administration of intravenous contrast. CONTRAST:  8 mL Vueway COMPARISON:  None Available. FINDINGS: Bones: No hip fracture,  dislocation or avascular necrosis. Diffuse heterogeneous marrow signal abnormality concerning for diffuse osseous metastatic disease. Focal tumor replacement of the right-side of the L5 vertebral body extending into the right L5 pedicle and posterior elements with a small extraosseous component extending into the right L5 neural foramen with moderate-severe stenosis. Pathologic compression fracture of the L5 vertebral body with approximately 35% height loss. Moderate bilateral facet arthropathy at L5-S1. Mild osteoarthritis of bilateral SI joints. Articular cartilage and labrum Articular cartilage: Mild bilateral chondral thinning. No focal chondral defect. Labrum: Grossly intact, but evaluation is limited by lack of intraarticular fluid. Joint or bursal effusion Joint effusion:  No hip joint effusion.  No SI joint effusion. Bursae:  No bursal fluid. Muscles and tendons Flexors: Normal. Extensors: Normal. Abductors: Normal. Adductors: Normal. Gluteals: Normal. Hamstrings: Normal. Other findings No pelvic free fluid. No fluid collection or hematoma. No inguinal lymphadenopathy. No inguinal hernia. IMPRESSION: 1. Diffuse heterogeneous marrow signal abnormality  concerning for diffuse osseous metastatic disease. 2. Focal tumor replacement of the right-side of the L5 vertebral body extending into the right L5 pedicle and posterior elements with a small extraosseous component extending into the right L5 neural foramen with moderate-severe stenosis. Pathologic compression fracture of the L5 vertebral body with approximately 35% height loss. Electronically Signed   By: Kathreen Devoid M.D.   On: 03/27/2022 10:28   MR Lumbar Spine W Wo Contrast  Result Date: 03/27/2022 CLINICAL DATA:  57 year old female with breast cancer. Evidence of osseous metastatic disease on abdomen MRI last month. EXAM: MRI LUMBAR SPINE WITHOUT AND WITH CONTRAST TECHNIQUE: Multiplanar and multiecho pulse sequences of the lumbar spine were obtained without and with intravenous contrast. CONTRAST:  8 mL Vueway COMPARISON:  Previous lumbar MRI 08/18/2016. Abdomen MRI 02/23/2022. FINDINGS: Segmentation: Lumbar segmentation appears to be normal and will be designated as such for this report. Alignment: Relatively stable lumbar lordosis since 2018. Mild grade 1 anterolisthesis of L5 on S1 has not significantly changed and appears to be degenerative in nature. Vertebrae: Widespread abnormal marrow signal and enhancement throughout the visible spine, sacrum. Subtotal tumor replacement of the central L5 vertebral body, right L5 pedicle and posterior elements. Extraosseous tumor there (series 6, image 33) invading the right L5 neural foramen with moderate or severe impingement of the exiting nerve. And mild pathologic compression fracture of the L5 body (loss of height up to 35%. Tumor extension into the L5 S1 disc (series 9, image 8). Miliary pattern of enhancing metastatic disease elsewhere throughout the visible spine and sacrum. Next largest lumbar metastases are about 9-10 mm in the L1 and L2 vertebral bodies. No other pathologic fracture identified. Conus medullaris and cauda equina: Conus extends to the L1  level. No lower spinal cord or conus signal abnormality. No lower thoracic spinal cord leptomeningeal enhancement, but punctate foci of abnormal enhancement of the cauda equina (series 9, images 8 and 9) is highly suspicious for early nerve root metastatic disease. Paraspinal and other soft tissues: Visible abdominal viscera are stable from last month. Disc levels: Mild for age superimposed lower thoracic and lumbar spine degeneration. No degenerative spinal stenosis. No epidural tumor above the L5 level. Right L5 foraminal tumor. And early ventral epidural tumor which is enhancing on series 9, image 9. IMPRESSION: 1. Positive for diffuse osseous metastatic disease throughout the visible spine and sacrum. Subtotal tumor replacement of the central L5 vertebral body and right L5 posterior elements with mild pathologic compression fracture and extraosseous tumor in the right L5 neural foramen (moderate to  severe neural impingement), also early involvement of the ventral epidural space. 2. Punctate abnormal enhancement of the cauda equina nerve roots highly suspicious for early leptomeningeal metastases. No definite involvement of the lower thoracic spinal cord or conus. Electronically Signed   By: Genevie Ann M.D.   On: 03/27/2022 07:23    Labs:  CBC: Recent Labs    09/18/21 1231 10/30/21 1238 03/03/22 1256  WBC 6.1 5.7 6.8  HGB 11.2* 11.4* 10.9*  HCT 33.1* 34.6* 33.0*  PLT 266 278 277    COAGS: No results for input(s): "INR", "APTT" in the last 8760 hours.  BMP: Recent Labs    09/18/21 1231 10/30/21 1238 03/03/22 1256  NA 143 141 141  K 4.1 4.2 3.9  CL 107 105 107  CO2 '23 26 26  '$ GLUCOSE 83 81 93  BUN '12 13 13  '$ CALCIUM 9.6 9.8 9.4  CREATININE 0.59 0.63 0.63  GFRNONAA  --   --  >60    LIVER FUNCTION TESTS: Recent Labs    09/18/21 1231 10/30/21 1238 03/03/22 1256  BILITOT 0.4 0.5 0.4  AST 14 13 14*  ALT '15 15 15  '$ ALKPHOS  --   --  70  PROT 6.8 7.3 7.8  ALBUMIN  --   --  4.0     TUMOR MARKERS: No results for input(s): "AFPTM", "CEA", "CA199", "CHROMGRNA" in the last 8760 hours.  Assessment and Plan:   Bone lesions suspicious for multiple myeloma versus plasmacytoma versus breast cancer recurrence: Sonya Franklin, 57 year old female, presents today to the [facility] Interventional Radiology department for an image-guided bone marrow biopsy with aspiration and a sacral mass biopsy.   Risks and benefits of bone marrow biopsy with aspiration and sacral mass biopsy were discussed with the patient and/or patient's family including, but not limited to bleeding, infection, damage to adjacent structures or low yield requiring additional tests.  All of the questions were answered and there is agreement to proceed. She has been NPO.   Consent signed and in chart.  Thank you for this interesting consult.  I greatly enjoyed meeting Sonya Franklin and look forward to participating in their care.  A copy of this report was sent to the requesting provider on this date.  Electronically Signed: Soyla Dryer, AGACNP-BC [phone] 04/14/2022, 10:27 AM   I spent a total of  30 Minutes   in face to face in clinical consultation, greater than 50% of which was counseling/coordinating care for sacral mass biopsy and bone marrow biopsy with aspiration.

## 2022-04-15 ENCOUNTER — Other Ambulatory Visit (HOSPITAL_COMMUNITY): Payer: Self-pay | Admitting: Radiology

## 2022-04-15 ENCOUNTER — Other Ambulatory Visit: Payer: Self-pay

## 2022-04-15 ENCOUNTER — Ambulatory Visit (HOSPITAL_COMMUNITY)
Admission: RE | Admit: 2022-04-15 | Discharge: 2022-04-15 | Disposition: A | Discharge status: Home / Self Care | Payer: Medicare Other | Source: Ambulatory Visit | Attending: Physician Assistant | Admitting: Physician Assistant

## 2022-04-15 DIAGNOSIS — C9000 Multiple myeloma not having achieved remission: Secondary | ICD-10-CM | POA: Diagnosis not present

## 2022-04-15 DIAGNOSIS — M899 Disorder of bone, unspecified: Secondary | ICD-10-CM

## 2022-04-15 DIAGNOSIS — M533 Sacrococcygeal disorders, not elsewhere classified: Secondary | ICD-10-CM

## 2022-04-15 DIAGNOSIS — D492 Neoplasm of unspecified behavior of bone, soft tissue, and skin: Secondary | ICD-10-CM | POA: Diagnosis not present

## 2022-04-15 DIAGNOSIS — I10 Essential (primary) hypertension: Secondary | ICD-10-CM | POA: Diagnosis not present

## 2022-04-15 DIAGNOSIS — C50919 Malignant neoplasm of unspecified site of unspecified female breast: Secondary | ICD-10-CM | POA: Insufficient documentation

## 2022-04-15 DIAGNOSIS — Z853 Personal history of malignant neoplasm of breast: Secondary | ICD-10-CM | POA: Insufficient documentation

## 2022-04-15 DIAGNOSIS — D649 Anemia, unspecified: Secondary | ICD-10-CM | POA: Diagnosis not present

## 2022-04-15 DIAGNOSIS — R209 Unspecified disturbances of skin sensation: Secondary | ICD-10-CM

## 2022-04-15 DIAGNOSIS — M5431 Sciatica, right side: Secondary | ICD-10-CM

## 2022-04-15 DIAGNOSIS — Z79899 Other long term (current) drug therapy: Secondary | ICD-10-CM | POA: Diagnosis not present

## 2022-04-15 DIAGNOSIS — M457 Ankylosing spondylitis of lumbosacral region: Secondary | ICD-10-CM

## 2022-04-15 DIAGNOSIS — C7951 Secondary malignant neoplasm of bone: Secondary | ICD-10-CM | POA: Diagnosis not present

## 2022-04-15 DIAGNOSIS — M898X8 Other specified disorders of bone, other site: Secondary | ICD-10-CM | POA: Diagnosis not present

## 2022-04-15 DIAGNOSIS — G629 Polyneuropathy, unspecified: Secondary | ICD-10-CM

## 2022-04-15 DIAGNOSIS — M797 Fibromyalgia: Secondary | ICD-10-CM

## 2022-04-15 LAB — CBC
HCT: 32.8 % — ABNORMAL LOW (ref 36.0–46.0)
Hemoglobin: 10.9 g/dL — ABNORMAL LOW (ref 12.0–15.0)
MCH: 29.9 pg (ref 26.0–34.0)
MCHC: 33.2 g/dL (ref 30.0–36.0)
MCV: 89.9 fL (ref 80.0–100.0)
Platelets: 246 10*3/uL (ref 150–400)
RBC: 3.65 MIL/uL — ABNORMAL LOW (ref 3.87–5.11)
RDW: 12.8 % (ref 11.5–15.5)
WBC: 6 10*3/uL (ref 4.0–10.5)
nRBC: 0 % (ref 0.0–0.2)

## 2022-04-15 LAB — PROTIME-INR
INR: 1 (ref 0.8–1.2)
Prothrombin Time: 12.6 seconds (ref 11.4–15.2)

## 2022-04-15 MED ORDER — HYDROCODONE-ACETAMINOPHEN 5-325 MG PO TABS: 1.0000 | ORAL_TABLET | ORAL | Status: DC | PRN

## 2022-04-15 MED ORDER — MIDAZOLAM HCL 2 MG/2ML IJ SOLN
INTRAMUSCULAR | Status: AC | PRN
  Administered 2022-04-15 (×2): 1 mg via INTRAVENOUS

## 2022-04-15 MED ORDER — SODIUM CHLORIDE 0.9 % IV SOLN: INTRAVENOUS | Status: DC

## 2022-04-15 MED ORDER — FENTANYL CITRATE (PF) 100 MCG/2ML IJ SOLN
INTRAMUSCULAR | Status: AC
  Filled 2022-04-15: qty 2

## 2022-04-15 MED ORDER — LIDOCAINE HCL (PF) 1 % IJ SOLN
20.0000 mL | Freq: Once | INTRAMUSCULAR | Status: AC
  Administered 2022-04-15: 20 mL
  Filled 2022-04-15: qty 20

## 2022-04-15 MED ORDER — FENTANYL CITRATE (PF) 100 MCG/2ML IJ SOLN
INTRAMUSCULAR | Status: AC | PRN
  Administered 2022-04-15 (×2): 50 ug via INTRAVENOUS

## 2022-04-15 MED ORDER — MIDAZOLAM HCL 2 MG/2ML IJ SOLN
INTRAMUSCULAR | Status: AC
  Filled 2022-04-15: qty 2

## 2022-04-15 NOTE — Procedures (Signed)
Interventional Radiology Procedure Note  Procedure:  1) CT guided aspirate and core biopsy of right iliac bone 2) CT guided right L5 vertebral body bone biopsy  Complications: None  Recommendations: - Bedrest supine x 1 hrs - Hydrocodone PRN  Pain - Follow biopsy results - Recommend consideration of L5 Osteocool/Kyphoplasty.   Will reach out to Oncology team to consider.   Ruthann Cancer, MD Pager: [phone]

## 2022-04-16 ENCOUNTER — Telehealth: Payer: Self-pay | Admitting: Internal Medicine

## 2022-04-16 ENCOUNTER — Ambulatory Visit: Payer: Medicare Other | Admitting: Internal Medicine

## 2022-04-16 NOTE — Telephone Encounter (Signed)
Sonya Franklin [phone]  Lamar called wanting to get a shot for the pain she is in following her biopsy yesterday, she said she is unable to function. She also said she is down. She really needs to be able to function because of her son.

## 2022-04-16 NOTE — Telephone Encounter (Signed)
After talking with Dr Renold Genta, she wanted me to call Enya back and have her call Oncology first thing in the morning because she is under there care, with every thing she has going on right now.. I called Louna and she verbalized understanding.

## 2022-04-17 LAB — SURGICAL PATHOLOGY

## 2022-04-20 ENCOUNTER — Ambulatory Visit: Payer: Medicare Other | Admitting: Hematology and Oncology

## 2022-04-20 ENCOUNTER — Telehealth: Payer: Self-pay | Admitting: Physician Assistant

## 2022-04-20 ENCOUNTER — Ambulatory Visit: Payer: Medicare Other | Admitting: Physician Assistant

## 2022-04-20 NOTE — Telephone Encounter (Signed)
Called patient to discuss results of recent bone marrow biopsy and sacral mass biopsy. 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  Patient is agreeable with plan to see Dr. Lorenso Courier tomorrow to discuss treatment options and possibility of radiation. She reports continued pain in her low back that is relieved with the morphine I prescribed at previous visit although does not like the nausea accompanied with morphine use. She has prescription for zofran as well which she has not tried. Encouraged her to take pain mediation with food as well. She denies any red flag neuro symptoms that would warrant emergent ED evaluation at this time.

## 2022-04-21 ENCOUNTER — Other Ambulatory Visit: Payer: Self-pay

## 2022-04-21 ENCOUNTER — Inpatient Hospital Stay: Payer: Medicare Other | Attending: Physician Assistant | Admitting: Hematology and Oncology

## 2022-04-21 ENCOUNTER — Telehealth: Payer: Self-pay | Admitting: Pharmacist

## 2022-04-21 ENCOUNTER — Other Ambulatory Visit (HOSPITAL_COMMUNITY): Payer: Self-pay

## 2022-04-21 ENCOUNTER — Other Ambulatory Visit: Payer: Self-pay | Admitting: Hematology and Oncology

## 2022-04-21 VITALS — BP 143/73 | HR 87 | Temp 97.8°F | Resp 16 | Wt 178.1 lb

## 2022-04-21 DIAGNOSIS — Z5112 Encounter for antineoplastic immunotherapy: Secondary | ICD-10-CM | POA: Insufficient documentation

## 2022-04-21 DIAGNOSIS — C9000 Multiple myeloma not having achieved remission: Secondary | ICD-10-CM | POA: Insufficient documentation

## 2022-04-21 DIAGNOSIS — I10 Essential (primary) hypertension: Secondary | ICD-10-CM | POA: Insufficient documentation

## 2022-04-21 DIAGNOSIS — Z79624 Long term (current) use of inhibitors of nucleotide synthesis: Secondary | ICD-10-CM | POA: Insufficient documentation

## 2022-04-21 DIAGNOSIS — K219 Gastro-esophageal reflux disease without esophagitis: Secondary | ICD-10-CM | POA: Diagnosis not present

## 2022-04-21 DIAGNOSIS — K449 Diaphragmatic hernia without obstruction or gangrene: Secondary | ICD-10-CM | POA: Diagnosis not present

## 2022-04-21 DIAGNOSIS — M545 Low back pain, unspecified: Secondary | ICD-10-CM | POA: Insufficient documentation

## 2022-04-21 DIAGNOSIS — G629 Polyneuropathy, unspecified: Secondary | ICD-10-CM | POA: Insufficient documentation

## 2022-04-21 DIAGNOSIS — Z7961 Long term (current) use of immunomodulator: Secondary | ICD-10-CM | POA: Diagnosis not present

## 2022-04-21 DIAGNOSIS — Z803 Family history of malignant neoplasm of breast: Secondary | ICD-10-CM | POA: Diagnosis not present

## 2022-04-21 DIAGNOSIS — M459 Ankylosing spondylitis of unspecified sites in spine: Secondary | ICD-10-CM | POA: Insufficient documentation

## 2022-04-21 DIAGNOSIS — E559 Vitamin D deficiency, unspecified: Secondary | ICD-10-CM | POA: Diagnosis not present

## 2022-04-21 DIAGNOSIS — Z79899 Other long term (current) drug therapy: Secondary | ICD-10-CM | POA: Diagnosis not present

## 2022-04-21 DIAGNOSIS — M899 Disorder of bone, unspecified: Secondary | ICD-10-CM | POA: Diagnosis not present

## 2022-04-21 DIAGNOSIS — Z7982 Long term (current) use of aspirin: Secondary | ICD-10-CM | POA: Insufficient documentation

## 2022-04-21 MED ORDER — LENALIDOMIDE 25 MG PO CAPS: 25.0000 mg | ORAL_CAPSULE | Freq: Every day | ORAL | 0 refills | Status: DC

## 2022-04-21 NOTE — Progress Notes (Signed)
Revlimid teaching done with pt. Pt verbalized understanding of how to take medication and monthly pt survey. Pt aware of possible side effects of medication , not to donate blood while taking or to share medication. [employer] application processes on line.

## 2022-04-21 NOTE — Progress Notes (Signed)
[place] [phone]   [phone]  PROGRESS NOTE  Patient Care Team: Elby Showers, MD as PCP - General (Internal Medicine)  Hematological/Oncological History # Multiple Myeloma with Plasmacytoma 03/03/2022: established with the [facility] in Oncology 04/15/2022: CT biopsy and bone marrow biopsy confirmed the presence of a plasmacytoma and bone marrow involvement of multiple myeloma.   Interval History:  Sonya Franklin 57 y.o. female with medical history significant for newly diagnosed multiple myeloma with plasmacytoma who presents for a follow up visit. The patient's last visit was on 03/30/2022 with our Symptom management clinic. In the interim since the last visit she underwent biopsies which confirmed a plasmacytoma with bone marrow involvement consistent with multiple myeloma.   On exam today Sonya Franklin reports that she tolerated the bone marrow biopsy procedure well.  She notes that she is still suffering from lower back pain.  The morphine caused her nausea and the tramadol was also causing issues.  We have prescribed her oxycodone to see if this is effective and also referred her to our palliative care service.  We talked about treatment regimens moving forward and due to her pre-existing neuropathy we have decided on Dara/rev/Dex.  She voiced her understanding of our plan moving forward.  She otherwise denies any fevers, chills, sweats, nausea, vomiting or diarrhea.  A full 10 point ROS was otherwise negative.  The bulk of our discussion focused on the diagnosis of multiple myeloma with plasmacytoma and the treatment moving forward.  She was willing and able to proceed with treatment at this time.  MEDICAL HISTORY:  Past Medical History:  Diagnosis Date   Ankylosing spondylitis ([place])    Anxiety    Arthritis    Breast cancer ([place])    right breast   Bursitis of hip, right    Cardiac arrhythmia    Carotid stenosis 08/26/2021   GE reflux    Hiatal  hernia    Hypertension    Hyperthyroidism    Ischemic bowel disease ([place])    Prediabetes 08/26/2021   Snoring 03/21/2014   Vitamin D deficiency     SURGICAL HISTORY: Past Surgical History:  Procedure Laterality Date   ABDOMINAL HYSTERECTOMY     BREAST LUMPECTOMY     right   MASTECTOMY     double    SOCIAL HISTORY: Social History   Socioeconomic History   Marital status: Legally Separated    Spouse name: Not on file   Number of children: 1   Years of education: Not on file   Highest education level: Not on file  Occupational History    Employer: UNEMPLOYED  Tobacco Use   Smoking status: Never   Smokeless tobacco: Never  Vaping Use   Vaping Use: Never used  Substance and Sexual Activity   Alcohol use: No   Drug use: No   Sexual activity: Yes  Other Topics Concern   Not on file  Social History Narrative   Caffeine occasional drinker.   Not working/disabled.  12th grader. Divorced, one kids.       Social history: She receives disability benefits for history of breast cancer 2007 and ankylosing spondylitis.  She resides with special needs son.  She is divorced.  Non-smoker.  No alcohol consumption.       Family history: Father with history of prostate cancer.  Patient's mother was diagnosed with breast cancer at age 81.  Maternal aunt with history of breast cancer.  3 brothers.   Right handed  Caffeine occas   One level stays on this level       Social Determinants of Health   Financial Resource Strain: Low Risk  (08/26/2021)   Overall Financial Resource Strain (CARDIA)    Difficulty of Paying Living Expenses: Not hard at all  Food Insecurity: No Food Insecurity (04/21/2022)   Hunger Vital Sign    Worried About Running Out of Food in the Last Year: Never true    Ran Out of Food in the Last Year: Never true  Transportation Needs: No Transportation Needs (08/26/2021)   PRAPARE - Hydrologist (Medical): No    Lack of Transportation  (Non-Medical): No  Physical Activity: Inactive (08/26/2021)   Exercise Vital Sign    Days of Exercise per Week: 0 days    Minutes of Exercise per Session: 0 min  Stress: Not on file  Social Connections: Not on file  Intimate Partner Violence: Not At Risk (04/21/2022)   Humiliation, Afraid, Rape, and Kick questionnaire    Fear of Current or Ex-Partner: No    Emotionally Abused: No    Physically Abused: No    Sexually Abused: No    FAMILY HISTORY: Family History  Problem Relation Age of Onset   Diabetes Mother    Hypertension Mother    Hyperlipidemia Mother    High blood pressure Father    High Cholesterol Father    Colitis Brother    Goiter Maternal Grandmother    Breast cancer Maternal Aunt     ALLERGIES:  is allergic to shellfish allergy, blue dyes (parenteral), and hydrochlorothiazide.  MEDICATIONS:  Current Outpatient Medications  Medication Sig Dispense Refill   ALPRAZolam (XANAX) 0.25 MG tablet TAKE 1 TABLET (0.25 MG TOTAL) BY MOUTH 2 (TWO) TIMES DAILY AS NEEDED FOR ANXIETY. 60 tablet 5   amLODipine (NORVASC) 5 MG tablet TAKE 1 TABLET (5 MG TOTAL) BY MOUTH DAILY. 90 tablet 2   DULoxetine (CYMBALTA) 30 MG capsule Take 2 capsules (60 mg total) by mouth daily. Take 30 mg (1 tablet), if no side effects after 1 week, increase to 60 mg (2 tablets) thereafter 60 capsule 5   hyoscyamine (ANASPAZ) 0.125 MG TBDP disintergrating tablet Place 1 tablet (0.125 mg total) under the tongue every 4 (four) hours as needed for cramping. 30 tablet 0   ibuprofen (ADVIL) 800 MG tablet TAKE 1 TABLET BY MOUTH EVERY 8 HOURS AS NEEDED FOR PAIN 180 tablet 3   olopatadine (PATANOL) 0.1 % ophthalmic solution Place 1 drop into both eyes 2 (two) times daily as needed. 5 mL PRN   ondansetron (ZOFRAN) 4 MG tablet Take 1 tablet (4 mg total) by mouth every 8 (eight) hours as needed for nausea or vomiting. 20 tablet 0   oxyCODONE (OXY IR/ROXICODONE) 5 MG immediate release tablet Take 1 tablet (5 mg total) by  mouth every 4 (four) hours as needed for severe pain. 15 tablet 0   pantoprazole (PROTONIX) 40 MG tablet TAKE 1 TABLET BY MOUTH EVERY DAY 90 tablet 3   saccharomyces boulardii (FLORASTOR) 250 MG capsule Take 1 capsule (250 mg total) by mouth 2 (two) times daily. (Patient taking differently: Take 250 mg by mouth as needed.) 40 capsule 0   triamcinolone cream (KENALOG) 0.1 % Apply 1 Application topically 3 (three) times daily. 30 g 1   acyclovir (ZOVIRAX) 400 MG tablet Take 1 tablet (400 mg total) by mouth 2 (two) times daily. 60 tablet 3   aspirin EC 81 MG tablet Take  81 mg by mouth daily. Swallow whole.     furosemide (LASIX) 20 MG tablet Take 1 tablet (20 mg total) by mouth daily as needed. 10 tablet 1   lenalidomide (REVLIMID) 25 MG capsule Take 1 capsule (25 mg total) by mouth daily. Take for 14 days on, then 7 days off. Repeat every 21 days. 14 capsule 0   No current facility-administered medications for this visit.    REVIEW OF SYSTEMS:   Constitutional: ( - ) fevers, ( - )  chills , ( - ) night sweats Eyes: ( - ) blurriness of vision, ( - ) double vision, ( - ) watery eyes Ears, nose, mouth, throat, and face: ( - ) mucositis, ( - ) sore throat Respiratory: ( - ) cough, ( - ) dyspnea, ( - ) wheezes Cardiovascular: ( - ) palpitation, ( - ) chest discomfort, ( - ) lower extremity swelling Gastrointestinal:  ( - ) nausea, ( - ) heartburn, ( - ) change in bowel habits Skin: ( - ) abnormal skin rashes Lymphatics: ( - ) new lymphadenopathy, ( - ) easy bruising Neurological: ( - ) numbness, ( - ) tingling, ( - ) new weaknesses Behavioral/Psych: ( - ) mood change, ( - ) new changes  All other systems were reviewed with the patient and are negative.  PHYSICAL EXAMINATION: ECOG PERFORMANCE STATUS: 1 - Symptomatic but completely ambulatory  Vitals:   04/21/22 1352  BP: (!) 143/73  Pulse: 87  Resp: 16  Temp: 97.8 F (36.6 C)  SpO2: 99%   Filed Weights   04/21/22 1352  Weight: 178 lb  1.6 oz (80.8 kg)    GENERAL: Well-appearing middle-aged African-American female, alert, no distress and comfortable SKIN: skin color, texture, turgor are normal, no rashes or significant lesions EYES: conjunctiva are pink and non-injected, sclera clear LUNGS: clear to auscultation and percussion with normal breathing effort HEART: regular rate & rhythm and no murmurs and no lower extremity edema Musculoskeletal: no cyanosis of digits and no clubbing  PSYCH: alert & oriented x 3, fluent speech NEURO: no focal motor/sensory deficits  LABORATORY DATA:  I have reviewed the data as listed    Latest Ref Rng & Units 04/15/2022    7:13 AM 03/03/2022   12:56 PM 10/30/2021   12:38 PM  CBC  WBC 4.0 - 10.5 K/uL 6.0  6.8  5.7   Hemoglobin 12.0 - 15.0 g/dL 10.9  10.9  11.4   Hematocrit 36.0 - 46.0 % 32.8  33.0  34.6   Platelets 150 - 400 K/uL 246  277  278        Latest Ref Rng & Units 03/03/2022   12:56 PM 10/30/2021   12:38 PM 09/18/2021   12:31 PM  CMP  Glucose 70 - 99 mg/dL 93  81  83   BUN 6 - 20 mg/dL 13  13  12   $ Creatinine 0.44 - 1.00 mg/dL 0.63  0.63  0.59   Sodium 135 - 145 mmol/L 141  141  143   Potassium 3.5 - 5.1 mmol/L 3.9  4.2  4.1   Chloride 98 - 111 mmol/L 107  105  107   CO2 22 - 32 mmol/L 26  26  23   $ Calcium 8.9 - 10.3 mg/dL 9.4  9.8  9.6   Total Protein 6.5 - 8.1 g/dL 7.8  7.3  6.8   Total Bilirubin 0.3 - 1.2 mg/dL 0.4  0.5  0.4   Alkaline Phos 38 -  126 U/L 70     AST 15 - 41 U/L 14  13  14   $ ALT 0 - 44 U/L 15  15  15     $ No results found for: "MPROTEIN" Lab Results  Component Value Date   KPAFRELGTCHN 1,263.0 (H) 03/03/2022   LAMBDASER 6.2 03/03/2022   KAPLAMBRATIO 20.45 (H) 03/31/2022   KAPLAMBRATIO 203.71 (H) 03/03/2022     RADIOGRAPHIC STUDIES: CT BONE MARROW BIOPSY & ASPIRATION  Result Date: 04/15/2022 INDICATION: 57 year old female with history of breast cancer and osseous metastatic disease. EXAM: 1)  CT-GUIDED BONE MARROW BIOPSY AND ASPIRATION 2)   CT guided right L5 vertebral body biopsy MEDICATIONS: None ANESTHESIA/SEDATION: Fentanyl 100 mcg IV; Versed 2 mg IV Sedation Time: 30 minutes; The patient was continuously monitored during the procedure by the interventional radiology nurse under my direct supervision. COMPLICATIONS: None immediate. PROCEDURE: Informed consent was obtained from the patient following an explanation of the procedure, risks, benefits and alternatives. The patient understands, agrees and consents for the procedure. All questions were addressed. A time out was performed prior to the initiation of the procedure. The patient was positioned prone and non-contrast localization CT was performed of the pelvis to demonstrate the iliac marrow spaces. The operative site was prepped and draped in the usual sterile fashion. Under sterile conditions and local anesthesia, a 22 gauge spinal needle was utilized for procedural planning. Next, an 11 gauge coaxial bone biopsy needle was advanced into the left iliac marrow space. Needle position was confirmed with CT imaging. Initially, a bone marrow aspiration was performed. Next, a bone marrow biopsy was obtained with the 11 gauge outer bone marrow device. Samples were prepared with the cytotechnologist and deemed adequate. The needle was removed and superficial hemostasis was obtained with manual compression. A dressing was applied. Attention was then turned toward L5 bone biopsy. Subdermal Local anesthesia was provided at the planned skin needle entry site with 1% lidocaine. A small skin nick was made. Deeper local anesthetic was administered with intermittent CT guidance to the periosteum of the L5 vertebral body. Next, under intermittent CT guidance, a 17 gauge introducer needle was positioned in the posterior, right aspect of the L5 vertebral body. A total of 2 18 gauge core biopsies were obtained with scant osseous material. Therefore, the coaxial needle was removed and replaced with an 11 gauge  bone biopsy device. A total of 2 passes were made yielding adequate gross biopsy sample. Samples were placed in formalin. Hemostasis was achieved with brief manual compression. Sterile bandage was applied. The patient tolerated the procedures well without immediate post procedural complication. IMPRESSION: 1. Successful CT guided left iliac bone marrow aspiration and core biopsy. 2. Technically successful CT-guided right posterior L5 vertebral body bone mass biopsy. PLAN: Percutaneous radiofrequency ablation of the L5 vertebral body with concomitant kyphoplasty should be considered. The patient's Oncologist has been contacted for further discussion. Ruthann Cancer, MD Vascular and Interventional Radiology Specialists [facility] Radiology Electronically Signed   By: Ruthann Cancer M.D.   On: 04/15/2022 11:42   CT Biopsy  Result Date: 04/15/2022 INDICATION: 57 year old female with history of breast cancer and osseous metastatic disease. EXAM: 1)  CT-GUIDED BONE MARROW BIOPSY AND ASPIRATION 2)  CT guided right L5 vertebral body biopsy MEDICATIONS: None ANESTHESIA/SEDATION: Fentanyl 100 mcg IV; Versed 2 mg IV Sedation Time: 30 minutes; The patient was continuously monitored during the procedure by the interventional radiology nurse under my direct supervision. COMPLICATIONS: None immediate. PROCEDURE: Informed consent was obtained from the  patient following an explanation of the procedure, risks, benefits and alternatives. The patient understands, agrees and consents for the procedure. All questions were addressed. A time out was performed prior to the initiation of the procedure. The patient was positioned prone and non-contrast localization CT was performed of the pelvis to demonstrate the iliac marrow spaces. The operative site was prepped and draped in the usual sterile fashion. Under sterile conditions and local anesthesia, a 22 gauge spinal needle was utilized for procedural planning. Next, an 11 gauge coaxial  bone biopsy needle was advanced into the left iliac marrow space. Needle position was confirmed with CT imaging. Initially, a bone marrow aspiration was performed. Next, a bone marrow biopsy was obtained with the 11 gauge outer bone marrow device. Samples were prepared with the cytotechnologist and deemed adequate. The needle was removed and superficial hemostasis was obtained with manual compression. A dressing was applied. Attention was then turned toward L5 bone biopsy. Subdermal Local anesthesia was provided at the planned skin needle entry site with 1% lidocaine. A small skin nick was made. Deeper local anesthetic was administered with intermittent CT guidance to the periosteum of the L5 vertebral body. Next, under intermittent CT guidance, a 17 gauge introducer needle was positioned in the posterior, right aspect of the L5 vertebral body. A total of 2 18 gauge core biopsies were obtained with scant osseous material. Therefore, the coaxial needle was removed and replaced with an 11 gauge bone biopsy device. A total of 2 passes were made yielding adequate gross biopsy sample. Samples were placed in formalin. Hemostasis was achieved with brief manual compression. Sterile bandage was applied. The patient tolerated the procedures well without immediate post procedural complication. IMPRESSION: 1. Successful CT guided left iliac bone marrow aspiration and core biopsy. 2. Technically successful CT-guided right posterior L5 vertebral body bone mass biopsy. PLAN: Percutaneous radiofrequency ablation of the L5 vertebral body with concomitant kyphoplasty should be considered. The patient's Oncologist has been contacted for further discussion. Ruthann Cancer, MD Vascular and Interventional Radiology Specialists [facility] Radiology Electronically Signed   By: Ruthann Cancer M.D.   On: 04/15/2022 11:42    ASSESSMENT & PLAN Sonya Franklin 57 y.o. female with medical history significant for newly diagnosed multiple myeloma  with plasmacytoma who presents for a follow up visit.  # Multiple Myeloma with Plasmactyoma -- Recommend pursuing radiation therapy to the sacral lesion in order to help relieve pain. -- Will start chemotherapy with Dara/rev/Dex.  Will avoid the use of Velcade in the setting of neuropathy -- Labs today show white blood cell count 6.0, hemoglobin 10.9, MCV 89.9, and platelets of 246 -- Plan to have the patient return to clinic for cycle 1 day 1 of Dara/rev/Dex.  #Pain Control -- Provided patient with oxycodone 5 mg every 6 hours as needed -- Patient not able to tolerate tramadol or morphine -- Will connect patient to our palliative care service.  #Supportive Care -- chemotherapy education to be scheduled  -- port placement not required.  -- zofran 39m q8H PRN and compazine 127mPO q6H for nausea -- acyclovir 40054mO BID for VCZ prophylaxis -- allopurinol 300m29m daily for TLS prophylaxis -- Pain medication as noted above  Orders Placed This Encounter  Procedures   CBC with Differential ([place])    Standing Status:   Future    Standing Expiration Date:   05/07/2023   CMP ([place])    Standing Status:   Future    Standing  Expiration Date:   05/07/2023   CBC with Differential ([place] Only)    Standing Status:   Future    Standing Expiration Date:   05/14/2023   CMP ([place] only)    Standing Status:   Future    Standing Expiration Date:   05/14/2023   CBC with Differential ([place] Only)    Standing Status:   Future    Standing Expiration Date:   05/21/2023   CMP ([place] only)    Standing Status:   Future    Standing Expiration Date:   05/21/2023   Multiple Myeloma Panel (SPEP&IFE w/QIG)    Standing Status:   Future    Standing Expiration Date:   05/27/2023   Kappa/lambda light chains    Standing Status:   Future    Standing Expiration Date:   05/27/2023   CBC with Differential ([place] Only)    Standing Status:   Future     Standing Expiration Date:   05/28/2023   CMP ([place] only)    Standing Status:   Future    Standing Expiration Date:   05/28/2023   CBC with Differential ([place] Only)    Standing Status:   Future    Standing Expiration Date:   06/04/2023   CMP ([place] only)    Standing Status:   Future    Standing Expiration Date:   06/04/2023   CBC with Differential ([place] Only)    Standing Status:   Future    Standing Expiration Date:   06/11/2023   CMP ([place] only)    Standing Status:   Future    Standing Expiration Date:   06/11/2023    All questions were answered. The patient knows to call the clinic with any problems, questions or concerns.  A total of more than 30 minutes were spent on this encounter with face-to-face time and non-face-to-face time, including preparing to see the patient, ordering tests and/or medications, counseling the patient and coordination of care as outlined above.   Ledell Peoples, MD Department of Hematology/Oncology [place] at [facility] Phone: [phone] Pager: [phone] Email: [email or web address]  05/03/2022 3:48 PM

## 2022-04-23 ENCOUNTER — Telehealth: Payer: Self-pay | Admitting: Pharmacy Technician

## 2022-04-23 ENCOUNTER — Other Ambulatory Visit (HOSPITAL_COMMUNITY): Payer: Self-pay

## 2022-04-23 MED ORDER — LENALIDOMIDE 25 MG PO CAPS: 25.0000 mg | ORAL_CAPSULE | Freq: Every day | ORAL | 0 refills | Status: DC

## 2022-04-23 NOTE — Telephone Encounter (Signed)
Oral Oncology Patient Advocate Encounter  Sonya Franklin has passed medicare verification and is fully active for use.  Sonya Franklin, CPhT-Adv Oncology Pharmacy Patient [place] Direct Number: [phone]  Fax: [phone]

## 2022-04-23 NOTE — Telephone Encounter (Signed)
Oral Oncology Patient Advocate Encounter  Prior Authorization for Lenalidomide has been approved.    PA# [id number] Effective dates: 04/23/22 through 03/16/23  Patients co-pay is $3,889.48.    Sonya Franklin, CPhT-Adv Oncology Pharmacy Patient [name] Direct Number: [phone]  Fax: [phone]

## 2022-04-23 NOTE — Telephone Encounter (Signed)
Oral Oncology Patient Advocate Encounter   Received notification that prior authorization for Lenalidomide is required.   PA submitted on 04/23/22 Key [id number] Status is pending     Lady Deutscher, CPhT-Adv Oncology Pharmacy Patient [place] Direct Number: [phone]  Fax: [phone]

## 2022-04-23 NOTE — Telephone Encounter (Signed)
Oral Oncology Patient Advocate Encounter  Was successful in securing patient a $12,000 grant from [facility] to provide copayment coverage for Lenalidomide.  This will keep the out of pocket expense at $0.     [employer] ID: [id number]  This grant is pending verification of patient's medicare eligibility. I have submitted the required documentation to the online portal.   The billing information is as follows and has been shared with [employer].    RxBin: [id number] PCN: [id number] Member ID: [id number] Group ID: [id number] Dates of Eligibility: 03/24/22 through 03/24/23  Fund:  [employer], CPhT-Adv Oncology Pharmacy Patient [place] Direct Number: [phone]  Fax: [phone]

## 2022-04-24 ENCOUNTER — Encounter (HOSPITAL_COMMUNITY): Payer: Self-pay | Admitting: Physician Assistant

## 2022-04-28 ENCOUNTER — Telehealth: Payer: Self-pay | Admitting: Hematology and Oncology

## 2022-04-28 ENCOUNTER — Encounter: Payer: Self-pay | Admitting: Hematology and Oncology

## 2022-04-28 ENCOUNTER — Telehealth: Payer: Self-pay

## 2022-04-28 ENCOUNTER — Other Ambulatory Visit: Payer: Self-pay | Admitting: Hematology and Oncology

## 2022-04-28 DIAGNOSIS — C9000 Multiple myeloma not having achieved remission: Secondary | ICD-10-CM | POA: Insufficient documentation

## 2022-04-28 MED ORDER — OXYCODONE HCL 5 MG PO TABS: 5.0000 mg | ORAL_TABLET | ORAL | 0 refills | Status: DC | PRN

## 2022-04-28 NOTE — Progress Notes (Signed)
DISCONTINUE ON PATHWAY REGIMEN - Multiple Myeloma and Other Plasma Cell Dyscrasias     A cycle is every 21 days:     Bortezomib      Lenalidomide      Dexamethasone   **Always confirm dose/schedule in your pharmacy ordering system**  REASON: Other Reason PRIOR TREATMENT: [id number]: VRd (Bortezomib 1.3 mg/m2 SUBQ D1, 8, 15 + Lenalidomide 25 mg + Dexamethasone 40 mg) q21 Days x 4-6 Cycles Maximum Prior to Stem Cell [facility] Concurrent with Referral to Transplant Service TREATMENT RESPONSE: Unable to Evaluate  START OFF PATHWAY REGIMEN - Multiple Myeloma and Other Plasma Cell Dyscrasias   [id number]:VRd (Bortezomib 1.3 mg/m2 IV D1,8,15 + Lenalidomide 25 mg PO D1-14 + Dexamethasone 40 mg PO) q21 Days:   A cycle is every 21 days:     Lenalidomide      Dexamethasone      Bortezomib   **Always confirm dose/schedule in your pharmacy ordering system**  Patient Characteristics: Multiple Myeloma, Newly Diagnosed, Transplant Eligible, Unknown or Awaiting Test Results Disease Classification: Multiple Myeloma R-ISS Staging: II Therapeutic Status: Newly Diagnosed Is Patient Eligible for Transplant<= Transplant Eligible Risk Status: Awaiting Test Results Intent of Therapy: Curative Intent, Discussed with Patient

## 2022-04-28 NOTE — Progress Notes (Signed)
START ON PATHWAY REGIMEN - Multiple Myeloma and Other Plasma Cell Dyscrasias     A cycle is every 21 days:     Bortezomib      Lenalidomide      Dexamethasone   **Always confirm dose/schedule in your pharmacy ordering system**  Patient Characteristics: Multiple Myeloma, Newly Diagnosed, Transplant Eligible, Unknown or Awaiting Test Results Disease Classification: Multiple Myeloma R-ISS Staging: II Therapeutic Status: Newly Diagnosed Is Patient Eligible for Transplant<= Transplant Eligible Risk Status: Awaiting Test Results Intent of Therapy: Curative Intent, Discussed with Patient 

## 2022-04-28 NOTE — Telephone Encounter (Signed)
Patient left voicemail on [facility] phone regarding pain medication. Per patient's voicemail, she was told at MD visit that pain medication would be prescribed and when she went to pick it up the pharmacy said they did not have the medication. Information relayed to MD and his nurse, pain medication ordered and patient will be notified by desk RN.

## 2022-04-28 NOTE — Telephone Encounter (Signed)
Called patient to set up patient education. Patient scheduled and notified. Patient aware how long appointment is.

## 2022-04-28 NOTE — Telephone Encounter (Signed)
Oral Chemotherapy Pharmacist Encounter   Notified by [place] that Revlimid will be delivered to patient's home on 05/01/22.  Attempted to reach patient to instruct her not to start the Revlimid at this time until she also starts Velcade and dexamethasone. No answer. LVM for patient to call back.   Leron Croak, PharmD, BCPS, BCOP Hematology/Oncology Clinical Pharmacist [facility] [phone] 04/28/2022 2:06 PM

## 2022-04-29 ENCOUNTER — Telehealth: Payer: Self-pay | Admitting: *Deleted

## 2022-04-29 ENCOUNTER — Other Ambulatory Visit: Payer: Self-pay

## 2022-04-29 NOTE — Telephone Encounter (Signed)
Zilah states she took one of the Oxycodone and it made her "nauseated and unfunctionable".  Is asking for something else, possibly steroids

## 2022-04-30 ENCOUNTER — Other Ambulatory Visit: Payer: Self-pay

## 2022-04-30 ENCOUNTER — Encounter: Payer: Self-pay | Admitting: Hematology and Oncology

## 2022-04-30 DIAGNOSIS — C9000 Multiple myeloma not having achieved remission: Secondary | ICD-10-CM

## 2022-04-30 MED ORDER — ACYCLOVIR 400 MG PO TABS: 400.0000 mg | ORAL_TABLET | Freq: Two times a day (BID) | ORAL | 3 refills | Status: DC

## 2022-05-01 ENCOUNTER — Other Ambulatory Visit (HOSPITAL_COMMUNITY): Payer: Self-pay

## 2022-05-01 ENCOUNTER — Telehealth: Payer: Self-pay

## 2022-05-01 MED ORDER — LENALIDOMIDE 25 MG PO CAPS: 25.0000 mg | ORAL_CAPSULE | Freq: Every day | ORAL | 0 refills | Status: DC

## 2022-05-01 NOTE — Telephone Encounter (Signed)
Received information from oral chemo pharmacist that patient has questions about pain medication. Called and left VM requesting call back. Sent activation code for my chart.

## 2022-05-01 NOTE — Telephone Encounter (Signed)
Oral Oncology Pharmacist Encounter  Received new prescription for Revlimid (lenalidomide) for the treatment of multiple myeloma in conjunction with bortezomib and dexamethasone, planned duration until disease progression or unacceptable drug toxicity.  CBC from 04/15/22 and CMP from 03/03/22 assessed, no relevant lab abnormalities requiring baseline dose adjustment required at this time. Prescription dose and frequency assessed for appropriateness. Confirmed with Dr. Lorenso Courier on 05/01/22 that patient will be taking Revlimid 14 days on/7 days off, NOT 21 days on/7 days off that was originally ordered. [facility] has already shipped out Revlimid for qty #21 to be delivered to patient's home on 05/01/22. Will ensure patient understands to only take Revlimid for 14 days and that she should have 7 capsules left over from this first cycle.   Current medication list in Epic reviewed, no relevant/significant DDIs with Revlimid identified.  Evaluated chart and no patient barriers to medication adherence noted.   Revlimid is being dispensed through [facility].   Oral Oncology Clinic will continue to follow for insurance authorization, copayment issues, initial counseling and start date.  Leron Croak, PharmD, BCPS, [facility] Hematology/Oncology Clinical Pharmacist [facility] [place] [phone] 05/01/2022 11:16 AM

## 2022-05-01 NOTE — Telephone Encounter (Signed)
Oral Chemotherapy Pharmacist Encounter  I spoke with patient for overview of: Revlimid for the treatment of multiple myeloma in conjunction with Velcade and dexamethasone, planned duration until disease progression or unacceptable drug toxicity.  Counseled patient on administration, dosing, side effects, monitoring, drug-food interactions, safe handling, storage, and disposal.  Patient will take Revlimid 61m capsules, 1 capsule by mouth once daily, without regard to food, with a full glass of water.  Revlimid will be given 14 days on, 7 days off, repeat every 21 days. Patient is aware to only take for 14 days instead of 21 days.   Revlimid start date: 05/05/22  Adverse effects of Revlimid include but are not limited to: nausea, GI upset, rash, fatigue, and decreased blood counts.   VTE PPX: patient counseled on importance of taking daily aspirin 81 mg for VTE prophylaxis  VZV PPX: acyclovir called in for patient to start with therapy - patient aware.  Reviewed with patient importance of keeping a medication schedule and plan for any missed doses. No barriers to medication adherence identified.  Medication reconciliation performed and medication/allergy list updated.  Insurance authorization for Revlimid has been obtained.  Revlimid prescription is being dispensed from [facility] it is a limited distribution medication.  All questions answered.  Ms. RMoroskyvoiced understanding and appreciation.   Medication education handout and calendar will be given to patient in clinic 05/04/22. Patient knows to call the office with questions or concerns. Oral Chemotherapy Clinic phone number provided to patient.   RLeron Croak PharmD, BCPS, [facility] Hematology/Oncology Clinical Pharmacist [id number]/16/2024 11:28 AM

## 2022-05-03 ENCOUNTER — Encounter: Payer: Self-pay | Admitting: Hematology and Oncology

## 2022-05-04 ENCOUNTER — Inpatient Hospital Stay: Payer: Medicare Other

## 2022-05-04 ENCOUNTER — Encounter: Payer: Self-pay | Admitting: Physician Assistant

## 2022-05-04 NOTE — Progress Notes (Signed)
Called pt to introduce myself as her Financial Resource Specialist and to discuss the [employer] grant.  I left a msg requesting she return my call if she's interested in applying for the grant.  ?

## 2022-05-05 ENCOUNTER — Encounter: Payer: Self-pay | Admitting: Physician Assistant

## 2022-05-05 ENCOUNTER — Other Ambulatory Visit: Payer: Self-pay

## 2022-05-05 ENCOUNTER — Encounter: Payer: Self-pay | Admitting: *Deleted

## 2022-05-05 ENCOUNTER — Inpatient Hospital Stay: Payer: Medicare Other

## 2022-05-05 ENCOUNTER — Inpatient Hospital Stay: Payer: Medicare Other | Admitting: Physician Assistant

## 2022-05-05 VITALS — BP 136/74 | HR 84 | Temp 97.7°F | Resp 17 | Ht 61.0 in | Wt 181.2 lb

## 2022-05-05 VITALS — BP 141/83 | HR 81 | Resp 16

## 2022-05-05 DIAGNOSIS — M549 Dorsalgia, unspecified: Secondary | ICD-10-CM | POA: Diagnosis not present

## 2022-05-05 DIAGNOSIS — C9000 Multiple myeloma not having achieved remission: Secondary | ICD-10-CM

## 2022-05-05 DIAGNOSIS — Z7961 Long term (current) use of immunomodulator: Secondary | ICD-10-CM | POA: Diagnosis not present

## 2022-05-05 DIAGNOSIS — Z79624 Long term (current) use of inhibitors of nucleotide synthesis: Secondary | ICD-10-CM | POA: Diagnosis not present

## 2022-05-05 DIAGNOSIS — G629 Polyneuropathy, unspecified: Secondary | ICD-10-CM | POA: Diagnosis not present

## 2022-05-05 DIAGNOSIS — G8929 Other chronic pain: Secondary | ICD-10-CM | POA: Diagnosis not present

## 2022-05-05 DIAGNOSIS — I10 Essential (primary) hypertension: Secondary | ICD-10-CM | POA: Diagnosis not present

## 2022-05-05 DIAGNOSIS — M545 Low back pain, unspecified: Secondary | ICD-10-CM | POA: Diagnosis not present

## 2022-05-05 DIAGNOSIS — E559 Vitamin D deficiency, unspecified: Secondary | ICD-10-CM | POA: Diagnosis not present

## 2022-05-05 DIAGNOSIS — K219 Gastro-esophageal reflux disease without esophagitis: Secondary | ICD-10-CM | POA: Diagnosis not present

## 2022-05-05 DIAGNOSIS — Z7982 Long term (current) use of aspirin: Secondary | ICD-10-CM | POA: Diagnosis not present

## 2022-05-05 DIAGNOSIS — K449 Diaphragmatic hernia without obstruction or gangrene: Secondary | ICD-10-CM | POA: Diagnosis not present

## 2022-05-05 DIAGNOSIS — Z79899 Other long term (current) drug therapy: Secondary | ICD-10-CM | POA: Diagnosis not present

## 2022-05-05 DIAGNOSIS — Z5112 Encounter for antineoplastic immunotherapy: Secondary | ICD-10-CM | POA: Diagnosis not present

## 2022-05-05 DIAGNOSIS — Z803 Family history of malignant neoplasm of breast: Secondary | ICD-10-CM | POA: Diagnosis not present

## 2022-05-05 DIAGNOSIS — M459 Ankylosing spondylitis of unspecified sites in spine: Secondary | ICD-10-CM | POA: Diagnosis not present

## 2022-05-05 LAB — CMP (CANCER CENTER ONLY)
ALT: 15 U/L (ref 0–44)
AST: 12 U/L — ABNORMAL LOW (ref 15–41)
Albumin: 4.4 g/dL (ref 3.5–5.0)
Alkaline Phosphatase: 83 U/L (ref 38–126)
Anion gap: 7 (ref 5–15)
BUN: 11 mg/dL (ref 6–20)
CO2: 28 mmol/L (ref 22–32)
Calcium: 9.5 mg/dL (ref 8.9–10.3)
Chloride: 107 mmol/L (ref 98–111)
Creatinine: 0.67 mg/dL (ref 0.44–1.00)
GFR, Estimated: >60 mL/min (ref >60)
Glucose, Bld: 100 mg/dL — ABNORMAL HIGH (ref 70–99)
Potassium: 3.5 mmol/L (ref 3.5–5.1)
Sodium: 142 mmol/L (ref 135–145)
Total Bilirubin: 0.3 mg/dL (ref 0.3–1.2)
Total Protein: 7.1 g/dL (ref 6.5–8.1)

## 2022-05-05 LAB — CBC WITH DIFFERENTIAL (CANCER CENTER ONLY)
Abs Immature Granulocytes: 0.02 10*3/uL (ref 0.00–0.07)
Basophils Absolute: 0 10*3/uL (ref 0.0–0.1)
Basophils Relative: 1 %
Eosinophils Absolute: 0.1 10*3/uL (ref 0.0–0.5)
Eosinophils Relative: 2 %
HCT: 32.9 % — ABNORMAL LOW (ref 36.0–46.0)
Hemoglobin: 10.9 g/dL — ABNORMAL LOW (ref 12.0–15.0)
Immature Granulocytes: 0 %
Lymphocytes Relative: 19 %
Lymphs Abs: 1.2 10*3/uL (ref 0.7–4.0)
MCH: 29.3 pg (ref 26.0–34.0)
MCHC: 33.1 g/dL (ref 30.0–36.0)
MCV: 88.4 fL (ref 80.0–100.0)
Monocytes Absolute: 0.5 10*3/uL (ref 0.1–1.0)
Monocytes Relative: 9 %
Neutro Abs: 4.3 10*3/uL (ref 1.7–7.7)
Neutrophils Relative %: 69 %
Platelet Count: 262 10*3/uL (ref 150–400)
RBC: 3.72 MIL/uL — ABNORMAL LOW (ref 3.87–5.11)
RDW: 12.9 % (ref 11.5–15.5)
WBC Count: 6.2 10*3/uL (ref 4.0–10.5)
nRBC: 0 % (ref 0.0–0.2)

## 2022-05-05 MED ORDER — BORTEZOMIB CHEMO SQ INJECTION 3.5 MG (2.5MG/ML)
1.3000 mg/m2 | Freq: Once | INTRAMUSCULAR | Status: AC
  Administered 2022-05-05: 2.5 mg via SUBCUTANEOUS
  Filled 2022-05-05: qty 1

## 2022-05-05 MED ORDER — DEXAMETHASONE 4 MG PO TABS
40.0000 mg | ORAL_TABLET | Freq: Once | ORAL | Status: AC
  Administered 2022-05-05: 40 mg via ORAL
  Filled 2022-05-05: qty 10

## 2022-05-05 NOTE — Progress Notes (Signed)
Pt reported to infusion today for first time Velcade injection. Per the encounter note with Dr. Lorenso Courier on 04/21/2022 "Will start chemotherapy with Dara/rev/Dex. Will avoid the use of Velcade in the setting of neuropathy."  Melissa RPH reached out to this RN and Terisa Starr regarding treatment orders today. This RN and [facility] verified with Dr. Lorenso Courier which tx he wanted Pt to receive. Per Dr. Lorenso Courier OK to proceed today with Velcade d/t improvement in neuropathy.  This RN educated Pt to notify her provider with any changes in her neuropathy. Pt verbalized understanding and was agreeable.

## 2022-05-05 NOTE — Progress Notes (Signed)
Proceed with velcade today as ordered

## 2022-05-05 NOTE — Patient Instructions (Signed)
[facility]  Discharge Instructions: Thank you for choosing [facility] to provide your oncology and hematology care.   If you have a lab appointment with the [facility], please go directly to the [facility] and check in at the registration area.   Wear comfortable clothing and clothing appropriate for easy access to any Portacath or PICC line.   We strive to give you quality time with your provider. You may need to reschedule your appointment if you arrive late (15 or more minutes).  Arriving late affects you and other patients whose appointments are after yours.  Also, if you miss three or more appointments without notifying the office, you may be dismissed from the clinic at the provider's discretion.      For prescription refill requests, have your pharmacy contact our office and allow 72 hours for refills to be completed.    Today you received the following chemotherapy and/or immunotherapy agents: Velcade      To help prevent nausea and vomiting after your treatment, we encourage you to take your nausea medication as directed.  BELOW ARE SYMPTOMS THAT SHOULD BE REPORTED IMMEDIATELY: *FEVER GREATER THAN 100.4 F (38 C) OR HIGHER *CHILLS OR SWEATING *NAUSEA AND VOMITING THAT IS NOT CONTROLLED WITH YOUR NAUSEA MEDICATION *UNUSUAL SHORTNESS OF BREATH *UNUSUAL BRUISING OR BLEEDING *URINARY PROBLEMS (pain or burning when urinating, or frequent urination) *BOWEL PROBLEMS (unusual diarrhea, constipation, pain near the anus) TENDERNESS IN MOUTH AND THROAT WITH OR WITHOUT PRESENCE OF ULCERS (sore throat, sores in mouth, or a toothache) UNUSUAL RASH, SWELLING OR PAIN  UNUSUAL VAGINAL DISCHARGE OR ITCHING   Items with * indicate a potential emergency and should be followed up as soon as possible or go to the Emergency Department if any problems should occur.  Please show the CHEMOTHERAPY ALERT CARD or IMMUNOTHERAPY ALERT CARD at  check-in to the Emergency Department and triage nurse.  Should you have questions after your visit or need to cancel or reschedule your appointment, please contact [facility]  Dept: [phone]  and follow the prompts.  Office hours are 8:00 a.m. to 4:30 p.m. Monday - Friday. Please note that voicemails left after 4:00 p.m. may not be returned until the following business day.  We are closed weekends and major holidays. You have access to a nurse at all times for urgent questions. Please call the main number to the clinic Dept: [phone] and follow the prompts.   For any non-urgent questions, you may also contact your provider using MyChart. We now offer e-Visits for anyone 77 and older to request care online for non-urgent symptoms. For details visit [email or web address].   Also download the MyChart app! Go to the app store, search "MyChart", open the app, select [facility], and log in with your MyChart username and password.

## 2022-05-05 NOTE — Progress Notes (Signed)
Pt returned my call and would like to apply for the [employer] so she will bring her proof of income on 05/12/22.  If approved I will give her an expense sheet and my card for any questions or concerns she may have in the future.

## 2022-05-05 NOTE — Progress Notes (Signed)
Patient seen by PA today  Vitals are within treatment parameters.  Labs reviewed: and are within treatment parameters.  Per physician team, patient is ready for treatment and there are NO modifications to the treatment plan.

## 2022-05-06 ENCOUNTER — Encounter: Payer: Self-pay | Admitting: Hematology and Oncology

## 2022-05-06 ENCOUNTER — Telehealth: Payer: Self-pay

## 2022-05-06 MED ORDER — LIDOCAINE 5 % EX PTCH: 1.0000 | MEDICATED_PATCH | CUTANEOUS | 0 refills | Status: DC

## 2022-05-06 NOTE — Progress Notes (Signed)
[place] [phone]   [phone]) [phone]  PROGRESS NOTE  Patient Care Team: Elby Showers, MD as PCP - General (Internal Medicine)  Hematological/Oncological History # Multiple Myeloma with Plasmacytoma 03/03/2022: established with the [facility] in Oncology 04/15/2022: CT biopsy and bone marrow biopsy confirmed the presence of a plasmacytoma and bone marrow involvement of multiple myeloma.  05/05/2022: Cycle 1 of VRD therapy  Interval History:  Sonya Franklin 57 y.o. female with medical history significant for recently diagnosed multiple myeloma with plasmacytoma who presents for a follow up visit. The patient's last visit was on 04/21/2022 with Dr. Lorenso Courier. She presents today to start Cycle 1, Day 1 of VRD therapy.   On exam today Sonya Franklin reports having diffuse body aches. She applying tiger balm to affected areas with minimal relief. She is hesitant to take opoid medications due to poor tolerance. She reports her energy and appetite are stable. She denies nausea, vomiting or abdominal pain. She denies any bowel habit changes including recurrent episodes of diarrhea or constipation. She denies easy bruising or signs of active bleeding. Her neuropathy has improved in her legs and only evident in her feet. She denies fevers, chills, sweats, shortness of breath, chest pain or cough. She has no other complaints. Rest of the 10 point ROS is below.   MEDICAL HISTORY:  Past Medical History:  Diagnosis Date   Ankylosing spondylitis ([place])    Anxiety    Arthritis    Breast cancer ([place])    right breast   Bursitis of hip, right    Cardiac arrhythmia    Carotid stenosis 08/26/2021   GE reflux    Hiatal hernia    Hypertension    Hyperthyroidism    Ischemic bowel disease ([place])    Prediabetes 08/26/2021   Snoring 03/21/2014   Vitamin D deficiency     SURGICAL HISTORY: Past Surgical History:  Procedure Laterality Date   ABDOMINAL HYSTERECTOMY     BREAST LUMPECTOMY      right   MASTECTOMY     double    SOCIAL HISTORY: Social History   Socioeconomic History   Marital status: Legally Separated    Spouse name: Not on file   Number of children: 1   Years of education: Not on file   Highest education level: Not on file  Occupational History    Employer: UNEMPLOYED  Tobacco Use   Smoking status: Never   Smokeless tobacco: Never  Vaping Use   Vaping Use: Never used  Substance and Sexual Activity   Alcohol use: No   Drug use: No   Sexual activity: Yes  Other Topics Concern   Not on file  Social History Narrative   Caffeine occasional drinker.   Not working/disabled.  12th grader. Divorced, one kids.       Social history: She receives disability benefits for history of breast cancer 2007 and ankylosing spondylitis.  She resides with special needs son.  She is divorced.  Non-smoker.  No alcohol consumption.       Family history: Father with history of prostate cancer.  Patient's mother was diagnosed with breast cancer at age 91.  Maternal aunt with history of breast cancer.  3 brothers.   Right handed   Caffeine occas   One level stays on this level       Social Determinants of Health   Financial Resource Strain: Low Risk  (08/26/2021)   Overall Financial Resource Strain (CARDIA)    Difficulty of  Paying Living Expenses: Not hard at all  Food Insecurity: No Food Insecurity (04/21/2022)   Hunger Vital Sign    Worried About Running Out of Food in the Last Year: Never true    Ran Out of Food in the Last Year: Never true  Transportation Needs: No Transportation Needs (08/26/2021)   PRAPARE - Hydrologist (Medical): No    Lack of Transportation (Non-Medical): No  Physical Activity: Inactive (08/26/2021)   Exercise Vital Sign    Days of Exercise per Week: 0 days    Minutes of Exercise per Session: 0 min  Stress: Not on file  Social Connections: Not on file  Intimate Partner Violence: Not At Risk (04/21/2022)    Humiliation, Afraid, Rape, and Kick questionnaire    Fear of Current or Ex-Partner: No    Emotionally Abused: No    Physically Abused: No    Sexually Abused: No    FAMILY HISTORY: Family History  Problem Relation Age of Onset   Diabetes Mother    Hypertension Mother    Hyperlipidemia Mother    High blood pressure Father    High Cholesterol Father    Colitis Brother    Goiter Maternal Grandmother    Breast cancer Maternal Aunt     ALLERGIES:  is allergic to shellfish allergy, blue dyes (parenteral), and hydrochlorothiazide.  MEDICATIONS:  Current Outpatient Medications  Medication Sig Dispense Refill   acyclovir (ZOVIRAX) 400 MG tablet Take 1 tablet (400 mg total) by mouth 2 (two) times daily. 60 tablet 3   ALPRAZolam (XANAX) 0.25 MG tablet TAKE 1 TABLET (0.25 MG TOTAL) BY MOUTH 2 (TWO) TIMES DAILY AS NEEDED FOR ANXIETY. 60 tablet 5   amLODipine (NORVASC) 5 MG tablet TAKE 1 TABLET (5 MG TOTAL) BY MOUTH DAILY. 90 tablet 2   aspirin EC 81 MG tablet Take 81 mg by mouth daily. Swallow whole.     DULoxetine (CYMBALTA) 30 MG capsule Take 2 capsules (60 mg total) by mouth daily. Take 30 mg (1 tablet), if no side effects after 1 week, increase to 60 mg (2 tablets) thereafter 60 capsule 5   hyoscyamine (ANASPAZ) 0.125 MG TBDP disintergrating tablet Place 1 tablet (0.125 mg total) under the tongue every 4 (four) hours as needed for cramping. 30 tablet 0   lenalidomide (REVLIMID) 25 MG capsule Take 1 capsule (25 mg total) by mouth daily. Take for 14 days on, then 7 days off. Repeat every 21 days. 14 capsule 0   lidocaine (LIDODERM) 5 % Place 1 patch onto the skin daily. Remove & Discard patch within 12 hours or as directed by MD 30 patch 0   olopatadine (PATANOL) 0.1 % ophthalmic solution Place 1 drop into both eyes 2 (two) times daily as needed. 5 mL PRN   ondansetron (ZOFRAN) 4 MG tablet Take 1 tablet (4 mg total) by mouth every 8 (eight) hours as needed for nausea or vomiting. 20 tablet 0    oxyCODONE (OXY IR/ROXICODONE) 5 MG immediate release tablet Take 1 tablet (5 mg total) by mouth every 4 (four) hours as needed for severe pain. (Patient not taking: Reported on 05/05/2022) 15 tablet 0   pantoprazole (PROTONIX) 40 MG tablet TAKE 1 TABLET BY MOUTH EVERY DAY 90 tablet 3   saccharomyces boulardii (FLORASTOR) 250 MG capsule Take 1 capsule (250 mg total) by mouth 2 (two) times daily. (Patient taking differently: Take 250 mg by mouth as needed.) 40 capsule 0   triamcinolone cream (KENALOG) 0.1 %  Apply 1 Application topically 3 (three) times daily. 30 g 1   furosemide (LASIX) 20 MG tablet Take 1 tablet (20 mg total) by mouth daily as needed. 10 tablet 1   ibuprofen (ADVIL) 800 MG tablet TAKE 1 TABLET BY MOUTH EVERY 8 HOURS AS NEEDED FOR PAIN (Patient not taking: Reported on 05/05/2022) 180 tablet 3   No current facility-administered medications for this visit.    REVIEW OF SYSTEMS:   Constitutional: ( - ) fevers, ( - )  chills , ( - ) night sweats Eyes: ( - ) blurriness of vision, ( - ) double vision, ( - ) watery eyes Ears, nose, mouth, throat, and face: ( - ) mucositis, ( - ) sore throat Respiratory: ( - ) cough, ( - ) dyspnea, ( - ) wheezes Cardiovascular: ( - ) palpitation, ( - ) chest discomfort, ( - ) lower extremity swelling Gastrointestinal:  ( - ) nausea, ( - ) heartburn, ( - ) change in bowel habits Skin: ( - ) abnormal skin rashes Lymphatics: ( - ) new lymphadenopathy, ( - ) easy bruising Neurological: ( - ) numbness, ( - ) tingling, ( - ) new weaknesses Behavioral/Psych: ( - ) mood change, ( - ) new changes  All other systems were reviewed with the patient and are negative.  PHYSICAL EXAMINATION: ECOG PERFORMANCE STATUS: 1 - Symptomatic but completely ambulatory  Vitals:   05/05/22 1415  BP: 136/74  Pulse: 84  Resp: 17  Temp: 97.7 F (36.5 C)  SpO2: 100%   Filed Weights   05/05/22 1415  Weight: 181 lb 3.2 oz (82.2 kg)    GENERAL: Well-appearing middle-aged  African-American female, alert, no distress and comfortable SKIN: skin color, texture, turgor are normal, no rashes or significant lesions EYES: conjunctiva are pink and non-injected, sclera clear LUNGS: clear to auscultation and percussion with normal breathing effort HEART: regular rate & rhythm and no murmurs and no lower extremity edema Musculoskeletal: no cyanosis of digits and no clubbing  PSYCH: alert & oriented x 3, fluent speech NEURO: no focal motor/sensory deficits  LABORATORY DATA:  I have reviewed the data as listed    Latest Ref Rng & Units 05/05/2022    1:42 PM 04/15/2022    7:13 AM 03/03/2022   12:56 PM  CBC  WBC 4.0 - 10.5 K/uL 6.2  6.0  6.8   Hemoglobin 12.0 - 15.0 g/dL 10.9  10.9  10.9   Hematocrit 36.0 - 46.0 % 32.9  32.8  33.0   Platelets 150 - 400 K/uL 262  246  277        Latest Ref Rng & Units 05/05/2022    1:42 PM 03/03/2022   12:56 PM 10/30/2021   12:38 PM  CMP  Glucose 70 - 99 mg/dL 100  93  81   BUN 6 - 20 mg/dL 11  13  13   $ Creatinine 0.44 - 1.00 mg/dL 0.67  0.63  0.63   Sodium 135 - 145 mmol/L 142  141  141   Potassium 3.5 - 5.1 mmol/L 3.5  3.9  4.2   Chloride 98 - 111 mmol/L 107  107  105   CO2 22 - 32 mmol/L 28  26  26   $ Calcium 8.9 - 10.3 mg/dL 9.5  9.4  9.8   Total Protein 6.5 - 8.1 g/dL 7.1  7.8  7.3   Total Bilirubin 0.3 - 1.2 mg/dL 0.3  0.4  0.5   Alkaline Phos 38 - 126  U/L 83  70    AST 15 - 41 U/L 12  14  13   $ ALT 0 - 44 U/L 15  15  15     $ No results found for: "MPROTEIN" Lab Results  Component Value Date   KPAFRELGTCHN 1,263.0 (H) 03/03/2022   LAMBDASER 6.2 03/03/2022   KAPLAMBRATIO 20.45 (H) 03/31/2022   KAPLAMBRATIO 203.71 (H) 03/03/2022     RADIOGRAPHIC STUDIES: CT BONE MARROW BIOPSY & ASPIRATION  Result Date: 04/15/2022 INDICATION: 57 year old female with history of breast cancer and osseous metastatic disease. EXAM: 1)  CT-GUIDED BONE MARROW BIOPSY AND ASPIRATION 2)  CT guided right L5 vertebral body biopsy MEDICATIONS:  None ANESTHESIA/SEDATION: Fentanyl 100 mcg IV; Versed 2 mg IV Sedation Time: 30 minutes; The patient was continuously monitored during the procedure by the interventional radiology nurse under my direct supervision. COMPLICATIONS: None immediate. PROCEDURE: Informed consent was obtained from the patient following an explanation of the procedure, risks, benefits and alternatives. The patient understands, agrees and consents for the procedure. All questions were addressed. A time out was performed prior to the initiation of the procedure. The patient was positioned prone and non-contrast localization CT was performed of the pelvis to demonstrate the iliac marrow spaces. The operative site was prepped and draped in the usual sterile fashion. Under sterile conditions and local anesthesia, a 22 gauge spinal needle was utilized for procedural planning. Next, an 11 gauge coaxial bone biopsy needle was advanced into the left iliac marrow space. Needle position was confirmed with CT imaging. Initially, a bone marrow aspiration was performed. Next, a bone marrow biopsy was obtained with the 11 gauge outer bone marrow device. Samples were prepared with the cytotechnologist and deemed adequate. The needle was removed and superficial hemostasis was obtained with manual compression. A dressing was applied. Attention was then turned toward L5 bone biopsy. Subdermal Local anesthesia was provided at the planned skin needle entry site with 1% lidocaine. A small skin nick was made. Deeper local anesthetic was administered with intermittent CT guidance to the periosteum of the L5 vertebral body. Next, under intermittent CT guidance, a 17 gauge introducer needle was positioned in the posterior, right aspect of the L5 vertebral body. A total of 2 18 gauge core biopsies were obtained with scant osseous material. Therefore, the coaxial needle was removed and replaced with an 11 gauge bone biopsy device. A total of 2 passes were made  yielding adequate gross biopsy sample. Samples were placed in formalin. Hemostasis was achieved with brief manual compression. Sterile bandage was applied. The patient tolerated the procedures well without immediate post procedural complication. IMPRESSION: 1. Successful CT guided left iliac bone marrow aspiration and core biopsy. 2. Technically successful CT-guided right posterior L5 vertebral body bone mass biopsy. PLAN: Percutaneous radiofrequency ablation of the L5 vertebral body with concomitant kyphoplasty should be considered. The patient's Oncologist has been contacted for further discussion. Ruthann Cancer, MD [facility] Radiology Electronically Signed   By: Ruthann Cancer M.D.   On: 04/15/2022 11:42   CT Biopsy  Result Date: 04/15/2022 INDICATION: 57 year old female with history of breast cancer and osseous metastatic disease. EXAM: 1)  CT-GUIDED BONE MARROW BIOPSY AND ASPIRATION 2)  CT guided right L5 vertebral body biopsy MEDICATIONS: None ANESTHESIA/SEDATION: Fentanyl 100 mcg IV; Versed 2 mg IV Sedation Time: 30 minutes; The patient was continuously monitored during the procedure by the interventional radiology nurse under my direct supervision. COMPLICATIONS: None immediate. PROCEDURE: Informed consent was obtained from the  patient following an explanation of the procedure, risks, benefits and alternatives. The patient understands, agrees and consents for the procedure. All questions were addressed. A time out was performed prior to the initiation of the procedure. The patient was positioned prone and non-contrast localization CT was performed of the pelvis to demonstrate the iliac marrow spaces. The operative site was prepped and draped in the usual sterile fashion. Under sterile conditions and local anesthesia, a 22 gauge spinal needle was utilized for procedural planning. Next, an 11 gauge coaxial bone biopsy needle was advanced into the left iliac  marrow space. Needle position was confirmed with CT imaging. Initially, a bone marrow aspiration was performed. Next, a bone marrow biopsy was obtained with the 11 gauge outer bone marrow device. Samples were prepared with the cytotechnologist and deemed adequate. The needle was removed and superficial hemostasis was obtained with manual compression. A dressing was applied. Attention was then turned toward L5 bone biopsy. Subdermal Local anesthesia was provided at the planned skin needle entry site with 1% lidocaine. A small skin nick was made. Deeper local anesthetic was administered with intermittent CT guidance to the periosteum of the L5 vertebral body. Next, under intermittent CT guidance, a 17 gauge introducer needle was positioned in the posterior, right aspect of the L5 vertebral body. A total of 2 18 gauge core biopsies were obtained with scant osseous material. Therefore, the coaxial needle was removed and replaced with an 11 gauge bone biopsy device. A total of 2 passes were made yielding adequate gross biopsy sample. Samples were placed in formalin. Hemostasis was achieved with brief manual compression. Sterile bandage was applied. The patient tolerated the procedures well without immediate post procedural complication. IMPRESSION: 1. Successful CT guided left iliac bone marrow aspiration and core biopsy. 2. Technically successful CT-guided right posterior L5 vertebral body bone mass biopsy. PLAN: Percutaneous radiofrequency ablation of the L5 vertebral body with concomitant kyphoplasty should be considered. The patient's Oncologist has been contacted for further discussion. Ruthann Cancer, MD [facility] Radiology Electronically Signed   By: Ruthann Cancer M.D.   On: 04/15/2022 11:42    ASSESSMENT & PLAN Sonya Franklin 57 y.o. female with medical history significant for newly diagnosed multiple myeloma with plasmacytoma who presents for a follow up  visit.  # Multiple Myeloma with Plasmactyoma --Confirmed with L5 bone lesion biopsy and bone marrow biopsy. -- Will start chemotherapy with VRd therapy today. Patient's neuropathy improved so okay to proceed with Velcade therapy.  --Recommend pursuing radiation therapy to the sacral lesion in order to help relieve pain if systemic therapy is not alleviating pain. --:Labs today reviewed and adequate for treatment. WBC 6.2, Hgb 10.9, Plt 262, Creatinine and Calcium levels normal.  --Proceed with Cycle 1, Day 1 of VRD therapy today without dose modifications --RTC for weekly treatment and q 2 week office visits  #Pain Control -- Unable to tolerate opoid medications --Send lidocaine patch today -- Made referral to our palliative care service.  #Supportive Care -- chemotherapy education complted -- port placement not required.  -- zofran 18m q8H PRN and compazine 161mPO q6H for nausea -- acyclovir 40093mO BID for VCZ prophylaxis -- allopurinol 300m40m daily for TLS prophylaxis -- Pain medication as noted above  No orders of the defined types were placed in this encounter.   All questions were answered. The patient knows to call the clinic with any problems, questions or concerns.  A total of more than 30 minutes  were spent on this encounter with face-to-face time and non-face-to-face time, including preparing to see the patient, ordering tests and/or medications, counseling the patient and coordination of care as outlined above.   Dede Query PA-C Dept of Hematology and [place] at [facility] Phone: [phone]   05/06/2022 6:49 AM

## 2022-05-06 NOTE — Telephone Encounter (Signed)
-----   Message from Willis Modena, RN sent at 05/05/2022  3:51 PM EST ----- Regarding: Dr. Lorenso Courier 1st time Velcade f/u tol well Dr. Lorenso Courier 1st time Velcade Pt tolerated tx well without incident. Pt call back due. Pt has Hx of bad neuropathy can you please check with her about this?

## 2022-05-06 NOTE — Telephone Encounter (Signed)
Sonya Franklin states that she is doing fine. She is eating, drinking, and urinating well. She knowls to call the office at [phone] if she has any questions or concerns.

## 2022-05-10 ENCOUNTER — Emergency Department (HOSPITAL_COMMUNITY)
Admission: EM | Admit: 2022-05-10 | Discharge: 2022-05-10 | Disposition: A | Discharge status: Home / Self Care | Payer: Medicare Other | Attending: Emergency Medicine | Admitting: Emergency Medicine

## 2022-05-10 ENCOUNTER — Other Ambulatory Visit: Payer: Self-pay

## 2022-05-10 ENCOUNTER — Encounter (HOSPITAL_COMMUNITY): Payer: Self-pay

## 2022-05-10 ENCOUNTER — Emergency Department (HOSPITAL_COMMUNITY): Payer: Medicare Other

## 2022-05-10 DIAGNOSIS — Z7982 Long term (current) use of aspirin: Secondary | ICD-10-CM | POA: Insufficient documentation

## 2022-05-10 DIAGNOSIS — Z8579 Personal history of other malignant neoplasms of lymphoid, hematopoietic and related tissues: Secondary | ICD-10-CM | POA: Diagnosis not present

## 2022-05-10 DIAGNOSIS — R002 Palpitations: Secondary | ICD-10-CM | POA: Diagnosis not present

## 2022-05-10 LAB — COMPREHENSIVE METABOLIC PANEL
ALT: 25 U/L (ref 0–44)
AST: 16 U/L (ref 15–41)
Albumin: 4.1 g/dL (ref 3.5–5.0)
Alkaline Phosphatase: 64 U/L (ref 38–126)
Anion gap: 11 (ref 5–15)
BUN: 10 mg/dL (ref 6–20)
CO2: 25 mmol/L (ref 22–32)
Calcium: 8.9 mg/dL (ref 8.9–10.3)
Chloride: 106 mmol/L (ref 98–111)
Creatinine, Ser: 0.64 mg/dL (ref 0.44–1.00)
GFR, Estimated: >60 mL/min (ref >60)
Glucose, Bld: 90 mg/dL (ref 70–99)
Potassium: 3.5 mmol/L (ref 3.5–5.1)
Sodium: 142 mmol/L (ref 135–145)
Total Bilirubin: 0.5 mg/dL (ref 0.3–1.2)
Total Protein: 6.7 g/dL (ref 6.5–8.1)

## 2022-05-10 LAB — CBC WITH DIFFERENTIAL/PLATELET
Abs Immature Granulocytes: 0.02 10*3/uL (ref 0.00–0.07)
Basophils Absolute: 0 10*3/uL (ref 0.0–0.1)
Basophils Relative: 0 %
Eosinophils Absolute: 0.2 10*3/uL (ref 0.0–0.5)
Eosinophils Relative: 4 %
HCT: 33.8 % — ABNORMAL LOW (ref 36.0–46.0)
Hemoglobin: 10.8 g/dL — ABNORMAL LOW (ref 12.0–15.0)
Immature Granulocytes: 0 %
Lymphocytes Relative: 24 %
Lymphs Abs: 1.3 10*3/uL (ref 0.7–4.0)
MCH: 29.3 pg (ref 26.0–34.0)
MCHC: 32 g/dL (ref 30.0–36.0)
MCV: 91.6 fL (ref 80.0–100.0)
Monocytes Absolute: 0.5 10*3/uL (ref 0.1–1.0)
Monocytes Relative: 9 %
Neutro Abs: 3.4 10*3/uL (ref 1.7–7.7)
Neutrophils Relative %: 63 %
Platelets: 222 10*3/uL (ref 150–400)
RBC: 3.69 MIL/uL — ABNORMAL LOW (ref 3.87–5.11)
RDW: 13.3 % (ref 11.5–15.5)
WBC: 5.4 10*3/uL (ref 4.0–10.5)
nRBC: 0 % (ref 0.0–0.2)

## 2022-05-10 LAB — T4, FREE: Free T4: 0.97 ng/dL (ref 0.61–1.12)

## 2022-05-10 LAB — TSH: TSH: 0.199 u[IU]/mL — ABNORMAL LOW (ref 0.350–4.500)

## 2022-05-10 LAB — HCG, QUANTITATIVE, PREGNANCY: hCG, Beta Chain, Quant, S: 8 m[IU]/mL — ABNORMAL HIGH (ref <5)

## 2022-05-10 LAB — TROPONIN I (HIGH SENSITIVITY): Troponin I (High Sensitivity): 9 ng/L (ref <18)

## 2022-05-10 MED ORDER — ACETAMINOPHEN 325 MG PO TABS
ORAL_TABLET | ORAL | Status: AC
  Filled 2022-05-10: qty 2

## 2022-05-10 MED ORDER — ACETAMINOPHEN 325 MG PO TABS
650.0000 mg | ORAL_TABLET | Freq: Once | ORAL | Status: AC
  Administered 2022-05-10: 650 mg via ORAL

## 2022-05-10 MED ORDER — SODIUM CHLORIDE 0.9 % IV BOLUS
1000.0000 mL | Freq: Once | INTRAVENOUS | Status: AC
  Administered 2022-05-10: 1000 mL via INTRAVENOUS

## 2022-05-10 NOTE — ED Triage Notes (Signed)
Pt presents to ED from home C/O palpitations and feeling uneasy. Pt reports starting chemo last Tuesday, advised to come to ED by cancer center.

## 2022-05-10 NOTE — Discharge Instructions (Signed)
As we discussed, your workup in the ER today was reassuring for acute findings.  X-ray imaging, EKG, and laboratory evaluation did not reveal any emergent concerns.  You do have some thyroid testing that is slightly abnormal and another test that is pending at your discharge and will require close outpatient follow-up with your doctor. Please schedule an appointment for this at your earliest convenience.  Return if development of any new or worsening symptoms.

## 2022-05-10 NOTE — ED Provider Notes (Signed)
[address] EMERGENCY DEPARTMENT AT [facility] Provider Note   CSN: [id number] Arrival date & time: 05/10/22  1135     History  Chief Complaint  Patient presents with   Palpitations    Sonya Franklin is a 57 y.o. female.  Patient with history of multiple myeloma currently on chemotherapy presents today with complaints of palpitations. She states that she began her first round of chemotherapy infusions on Tuesday and on Friday she began to have intermittent palpitations and 'feeling dehydrated.' She is not vomiting or having diarrhea and feels like she is drinking enough fluids, however states that this is how she has felt previously when she has been dehydrated. She states that the episodes of palpitations are intermittent without any discernable trigger and can occur with activity or with rest. The episodes normally last a few minutes and then resolve. She denies any chest pain or shortness of breath. No fevers, chills, headaches, abdominal pain, hematuria, or dysuria. She reports that she originally thought it could be the Valtrex she was prescribed for shingles prophylaxis and subsequently stopped this medication without any improvement. She states that she called her oncologist yesterday and was told to come here for evaluation.   The history is provided by the patient. No language interpreter was used.  Palpitations      Home Medications Prior to Admission medications   Medication Sig Start Date End Date Taking? Authorizing Provider  oxyCODONE (OXY IR/ROXICODONE) 5 MG immediate release tablet Take 1 tablet (5 mg total) by mouth every 4 (four) hours as needed for severe pain. Patient not taking: Reported on 05/05/2022 04/28/22   Orson Slick, MD  acyclovir (ZOVIRAX) 400 MG tablet Take 1 tablet (400 mg total) by mouth 2 (two) times daily. 04/30/22   Orson Slick, MD  ALPRAZolam Duanne Moron) 0.25 MG tablet TAKE 1 TABLET (0.25 MG TOTAL) BY MOUTH 2 (TWO) TIMES DAILY AS  NEEDED FOR ANXIETY. 02/22/21   Elby Showers, MD  amLODipine (NORVASC) 5 MG tablet TAKE 1 TABLET (5 MG TOTAL) BY MOUTH DAILY. 03/14/22   Elby Showers, MD  aspirin EC 81 MG tablet Take 81 mg by mouth daily. Swallow whole.    [provider]  DULoxetine (CYMBALTA) 30 MG capsule Take 2 capsules (60 mg total) by mouth daily. Take 30 mg (1 tablet), if no side effects after 1 week, increase to 60 mg (2 tablets) thereafter 01/08/22   Shellia Carwin, MD  furosemide (LASIX) 20 MG tablet Take 1 tablet (20 mg total) by mouth daily as needed. 08/26/21 01/08/22  Skeet Latch, MD  hyoscyamine (ANASPAZ) 0.125 MG TBDP disintergrating tablet Place 1 tablet (0.125 mg total) under the tongue every 4 (four) hours as needed for cramping. 08/22/21   Elby Showers, MD  ibuprofen (ADVIL) 800 MG tablet TAKE 1 TABLET BY MOUTH EVERY 8 HOURS AS NEEDED FOR PAIN Patient not taking: Reported on 05/05/2022 09/11/21   Elby Showers, MD  lenalidomide (REVLIMID) 25 MG capsule Take 1 capsule (25 mg total) by mouth daily. Take for 14 days on, then 7 days off. Repeat every 21 days. 05/01/22   Orson Slick, MD  lidocaine (LIDODERM) 5 % Place 1 patch onto the skin daily. Remove & Discard patch within 12 hours or as directed by MD 05/06/22   Dede Query T, PA-C  olopatadine (PATANOL) 0.1 % ophthalmic solution Place 1 drop into both eyes 2 (two) times daily as needed. 12/24/21  Elby Showers, MD  ondansetron (ZOFRAN) 4 MG tablet Take 1 tablet (4 mg total) by mouth every 8 (eight) hours as needed for nausea or vomiting. 03/30/22   Walisiewicz, Verline Lema E, PA-C  pantoprazole (PROTONIX) 40 MG tablet TAKE 1 TABLET BY MOUTH EVERY DAY 12/10/21   Elby Showers, MD  saccharomyces boulardii (FLORASTOR) 250 MG capsule Take 1 capsule (250 mg total) by mouth 2 (two) times daily. Patient taking differently: Take 250 mg by mouth as needed. 07/18/16   Barton Dubois, MD  triamcinolone cream (KENALOG) 0.1 % Apply 1 Application topically 3  (three) times daily. 10/03/21   Elby Showers, MD      Allergies    Shellfish allergy, Blue dyes (parenteral), and Hydrochlorothiazide    Review of Systems   Review of Systems  Cardiovascular:  Positive for palpitations.  All other systems reviewed and are negative.   Physical Exam Updated Vital Signs BP 126/76   Pulse 90   Temp 98.2 F (36.8 C)   Resp (!) 27   Ht '5\' 1"'$  (1.549 m)   Wt 81.6 kg   SpO2 99%   BMI 34.01 kg/m  Physical Exam Vitals and nursing note reviewed.  Constitutional:      General: She is not in acute distress.    Appearance: Normal appearance. She is normal weight. She is not ill-appearing, toxic-appearing or diaphoretic.  HENT:     Head: Normocephalic and atraumatic.  Eyes:     Pupils: Pupils are equal, round, and reactive to light.  Cardiovascular:     Rate and Rhythm: Normal rate and regular rhythm.     Pulses: Normal pulses.     Heart sounds: Normal heart sounds.  Pulmonary:     Effort: Pulmonary effort is normal. No respiratory distress.     Breath sounds: Normal breath sounds.  Abdominal:     General: Abdomen is flat.     Palpations: Abdomen is soft.     Tenderness: There is no abdominal tenderness.  Musculoskeletal:        General: Normal range of motion.     Cervical back: Normal range of motion.     Right lower leg: No edema.     Left lower leg: No edema.  Skin:    General: Skin is warm and dry.  Neurological:     General: No focal deficit present.     Mental Status: She is alert.  Psychiatric:        Mood and Affect: Mood normal.        Behavior: Behavior normal.     ED Results / Procedures / Treatments   Labs (all labs ordered are listed, but only abnormal results are displayed) Labs Reviewed  CBC WITH DIFFERENTIAL/PLATELET - Abnormal; Notable for the following components:      Result Value   RBC 3.69 (*)    Hemoglobin 10.8 (*)    HCT 33.8 (*)    All other components within normal limits  TSH - Abnormal; Notable for  the following components:   TSH 0.199 (*)    All other components within normal limits  HCG, QUANTITATIVE, PREGNANCY - Abnormal; Notable for the following components:   hCG, Beta Chain, Quant, S 8 (*)    All other components within normal limits  COMPREHENSIVE METABOLIC PANEL  URINALYSIS, ROUTINE W REFLEX MICROSCOPIC  T4, FREE  HCG, SERUM, QUALITATIVE  TROPONIN I (HIGH SENSITIVITY)    EKG None  Radiology DG Chest 2 View  Result Date:  05/10/2022 CLINICAL DATA:  Palpitations.  Chemotherapy EXAM: CHEST - 2 VIEW COMPARISON:  Chest x-ray dated 01/05/2022 FINDINGS: Heart size and mediastinal contours are stable. Lungs are clear. No pleural effusion or pneumothorax is seen. No acute-appearing osseous abnormality. IMPRESSION: No active cardiopulmonary disease. No evidence of pneumonia or pulmonary edema. Electronically Signed   By: Franki Cabot M.D.   On: 05/10/2022 13:27    Procedures Procedures    Medications Ordered in ED Medications  acetaminophen (TYLENOL) tablet 650 mg (has no administration in time range)  sodium chloride 0.9 % bolus 1,000 mL (1,000 mLs Intravenous New Bag/Given 05/10/22 1239)    ED Course/ Medical Decision Making/ A&P             HEART Score: 1                Medical Decision Making Amount and/or Complexity of Data Reviewed Labs: ordered. Radiology: ordered.   This patient is a 57 y.o. female who presents to the ED for concern of palpitations, this involves an extensive number of treatment options, and is a complaint that carries with it a high risk of complications and morbidity. The emergent differential diagnosis prior to evaluation includes, but is not limited to,  Cardiac arrhythmias, ACS, CHF, pericarditis, valvular disease, panic/anxiety, ETOH, stimulant use, medication side effect, anemia, hyperthyroidism, pulmonary embolism.  This is not an exhaustive differential.   Past Medical History / Co-morbidities / Social History: Hx multiple myeloma  currently receiving chemotherapy with first infusion last Tuesday   Physical Exam: Physical exam performed. The pertinent findings include: normal heart rate, no other pertinent physical exam findings  Lab Tests: I ordered, and personally interpreted labs.  The pertinent results include:  WBC 5.4, hgb 10.8, unchanged from previous. Troponin negative. TSH 0.199, T4 pending. No other acute laboratory findings   Imaging Studies: I ordered imaging studies including CXR. I independently visualized and interpreted imaging which showed NAD. I agree with the radiologist interpretation.   Cardiac Monitoring:  The patient was maintained on a cardiac monitor.  My attending physician Dr. Wyvonnia Dusky viewed and interpreted the cardiac monitored which showed an underlying rhythm of: sinus rhythm. I agree with this interpretation.   Medications: I ordered medication including fluids  for possible dehydration. Reevaluation of the patient after these medicines showed that the patient improved. I have reviewed the patients home medicines and have made adjustments as needed.    Disposition:  Patient presents today with intermittent palpitations after starting chemotherapy last Tuesday.  She is afebrile, nontoxic-appearing, and in no acute distress with reassuring vital signs.  After evaluating all of the data points in this case, the presentation of LATOIA MANGOLD is NOT consistent with Acute Coronary Syndrome (ACS) and/or myocardial ischemia, pulmonary embolism, aortic dissection; Borhaave's, significant arrythmia, pneumothorax, cardiac tamponade, or other emergent cardiopulmonary condition.  Further, the presentation of ISSABELA ARCHACKI is NOT consistent with pericarditis, myocarditis, mediastinitis, endocarditis, new valvular disease.  Additionally, the presentation of Sheranda G Raynoris NOT consistent with flail chest, cardiac contusion, ARDS, or significant intra-thoracic bleeding.  Moreover, this presentation  is NOT consistent with pneumonia or sepsis  The patient has a HEART Score: 1  She denies chest pain or shortness of breath, therefore very low suspicion for PE. Her heartrate has been normal throughout her visit today. Her TSH is slightly low, T4 is pending and will need follow-up outpatient. Symptoms not consistent with thyroid storm or other emergent indication requiring that she stay to wait for these  results. Suspect patients symptoms are potentially related to her recent initiation of chemotherapy given overall normal work-up. After fluids she states she is feeling better. She is able to walk around without any residual symptoms and would like to go home. She is reasonable for discharge. She has follow-up with her oncologist on Tuesday, recommend follow-up of her t4 at this appointment. Patient is understanding and amenable with plan, educated on red flag symptoms that would prompt immediate return.  Patient discharged in stable condition.   Strict return and follow-up precautions have been given by me personally or by detailed written instruction given verbally by nursing staff using the teach back method to the patient/family/caregiver(s).  Data Reviewed/Counseling: I have reviewed the patient's vital signs, nursing notes, and other relevant tests/information. I had a detailed discussion regarding the historical points, exam findings, and any diagnostic results supporting the discharge diagnosis. I also discussed the need for outpatient follow-up and the need to return to the ED if symptoms worsen or if there are any questions or concerns that arise at home.   I discussed this case with my attending physician Dr. Wyvonnia Dusky who cosigned this note including patient's presenting symptoms, physical exam, and planned diagnostics and interventions. Attending physician stated agreement with plan or made changes to plan which were implemented.     Final Clinical Impression(s) / ED Diagnoses Final  diagnoses:  Palpitations    Rx / DC Orders ED Discharge Orders     None     An After Visit Summary was printed and given to the patient.     Nestor Lewandowsky 05/10/22 1555    Ezequiel Essex, MD 05/10/22 [phone]

## 2022-05-11 ENCOUNTER — Telehealth: Payer: Self-pay

## 2022-05-11 NOTE — Transitions of Care (Post Inpatient/ED Visit) (Signed)
   05/11/2022  Name: Sonya Franklin MRN: [id number] DOB: [birth year]  Today's TOC FU Call Status: Today's TOC FU Call Status:: Successful TOC FU Call Competed TOC FU Call Complete Date: 05/11/22  Transition Care Management Follow-up Telephone Call Date of Discharge: 05/10/22 Discharge Facility: [facility]) Type of Discharge: Emergency Department Reason for ED Visit: Other: How have you been since you were released from the hospital?: Better (Better than yesterday but still sick believes it could be related to cancer treatment) Any questions or concerns?: No  Items Reviewed: Did you receive and understand the discharge instructions provided?: Yes Medications obtained and verified?: No Medications Not Reviewed Reasons:: Other: Any new allergies since your discharge?: No Dietary orders reviewed?: NA Do you have support at home?: Yes People in Home: other relative(s)  Home Care and Equipment/Supplies: [place] Ordered?: NA Any new equipment or medical supplies ordered?: NA  Functional Questionnaire: Do you need assistance with bathing/showering or dressing?: Yes Do you need assistance with meal preparation?: Yes Do you need assistance with eating?: Yes Do you have difficulty maintaining continence: Yes Do you need assistance with getting out of bed/getting out of a chair/moving?: Yes Do you have difficulty managing or taking your medications?: Yes  Folllow up appointments reviewed: PCP Follow-up appointment confirmed?: No MD Provider Line Number:[phone] Given: Yes [facility] Follow-up appointment confirmed?: Yes Date of Specialist follow-up appointment?: 05/12/22 Follow-Up Specialty Provider:: Dr. Lorenso Courier Do you need transportation to your follow-up appointment?: No Do you understand care options if your condition(s) worsen?: Yes-patient verbalized understanding    SIGNATURE Juliannah Ohmann D, CMA

## 2022-05-12 ENCOUNTER — Encounter: Payer: Self-pay | Admitting: Hematology and Oncology

## 2022-05-12 ENCOUNTER — Inpatient Hospital Stay: Payer: Medicare Other

## 2022-05-12 ENCOUNTER — Other Ambulatory Visit: Payer: Self-pay

## 2022-05-12 VITALS — BP 150/73 | HR 80 | Temp 98.9°F | Resp 17

## 2022-05-12 DIAGNOSIS — M459 Ankylosing spondylitis of unspecified sites in spine: Secondary | ICD-10-CM | POA: Diagnosis not present

## 2022-05-12 DIAGNOSIS — Z803 Family history of malignant neoplasm of breast: Secondary | ICD-10-CM | POA: Diagnosis not present

## 2022-05-12 DIAGNOSIS — C9000 Multiple myeloma not having achieved remission: Secondary | ICD-10-CM

## 2022-05-12 DIAGNOSIS — I10 Essential (primary) hypertension: Secondary | ICD-10-CM | POA: Diagnosis not present

## 2022-05-12 DIAGNOSIS — G629 Polyneuropathy, unspecified: Secondary | ICD-10-CM | POA: Diagnosis not present

## 2022-05-12 DIAGNOSIS — K449 Diaphragmatic hernia without obstruction or gangrene: Secondary | ICD-10-CM | POA: Diagnosis not present

## 2022-05-12 DIAGNOSIS — M545 Low back pain, unspecified: Secondary | ICD-10-CM | POA: Diagnosis not present

## 2022-05-12 DIAGNOSIS — K219 Gastro-esophageal reflux disease without esophagitis: Secondary | ICD-10-CM | POA: Diagnosis not present

## 2022-05-12 DIAGNOSIS — Z7982 Long term (current) use of aspirin: Secondary | ICD-10-CM | POA: Diagnosis not present

## 2022-05-12 DIAGNOSIS — E559 Vitamin D deficiency, unspecified: Secondary | ICD-10-CM | POA: Diagnosis not present

## 2022-05-12 DIAGNOSIS — Z79899 Other long term (current) drug therapy: Secondary | ICD-10-CM | POA: Diagnosis not present

## 2022-05-12 DIAGNOSIS — Z7961 Long term (current) use of immunomodulator: Secondary | ICD-10-CM | POA: Diagnosis not present

## 2022-05-12 DIAGNOSIS — Z79624 Long term (current) use of inhibitors of nucleotide synthesis: Secondary | ICD-10-CM | POA: Diagnosis not present

## 2022-05-12 DIAGNOSIS — Z5112 Encounter for antineoplastic immunotherapy: Secondary | ICD-10-CM | POA: Diagnosis not present

## 2022-05-12 LAB — CMP (CANCER CENTER ONLY)
ALT: 25 U/L (ref 0–44)
AST: 18 U/L (ref 15–41)
Albumin: 4.5 g/dL (ref 3.5–5.0)
Alkaline Phosphatase: 78 U/L (ref 38–126)
Anion gap: 8 (ref 5–15)
BUN: 11 mg/dL (ref 6–20)
CO2: 28 mmol/L (ref 22–32)
Calcium: 9 mg/dL (ref 8.9–10.3)
Chloride: 105 mmol/L (ref 98–111)
Creatinine: 0.65 mg/dL (ref 0.44–1.00)
GFR, Estimated: >60 mL/min (ref >60)
Glucose, Bld: 84 mg/dL (ref 70–99)
Potassium: 3.5 mmol/L (ref 3.5–5.1)
Sodium: 141 mmol/L (ref 135–145)
Total Bilirubin: 0.4 mg/dL (ref 0.3–1.2)
Total Protein: 7.2 g/dL (ref 6.5–8.1)

## 2022-05-12 LAB — CBC WITH DIFFERENTIAL (CANCER CENTER ONLY)
Abs Immature Granulocytes: 0.03 10*3/uL (ref 0.00–0.07)
Basophils Absolute: 0 10*3/uL (ref 0.0–0.1)
Basophils Relative: 0 %
Eosinophils Absolute: 0.2 10*3/uL (ref 0.0–0.5)
Eosinophils Relative: 2 %
HCT: 31.8 % — ABNORMAL LOW (ref 36.0–46.0)
Hemoglobin: 10.7 g/dL — ABNORMAL LOW (ref 12.0–15.0)
Immature Granulocytes: 0 %
Lymphocytes Relative: 15 %
Lymphs Abs: 1.2 10*3/uL (ref 0.7–4.0)
MCH: 29.7 pg (ref 26.0–34.0)
MCHC: 33.6 g/dL (ref 30.0–36.0)
MCV: 88.3 fL (ref 80.0–100.0)
Monocytes Absolute: 0.5 10*3/uL (ref 0.1–1.0)
Monocytes Relative: 6 %
Neutro Abs: 5.9 10*3/uL (ref 1.7–7.7)
Neutrophils Relative %: 77 %
Platelet Count: 236 10*3/uL (ref 150–400)
RBC: 3.6 MIL/uL — ABNORMAL LOW (ref 3.87–5.11)
RDW: 13.2 % (ref 11.5–15.5)
WBC Count: 7.8 10*3/uL (ref 4.0–10.5)
nRBC: 0 % (ref 0.0–0.2)

## 2022-05-12 LAB — LACTATE DEHYDROGENASE: LDH: 212 U/L — ABNORMAL HIGH (ref 98–192)

## 2022-05-12 MED ORDER — DEXAMETHASONE 4 MG PO TABS
20.0000 mg | ORAL_TABLET | Freq: Once | ORAL | Status: AC
  Administered 2022-05-12: 20 mg via ORAL
  Filled 2022-05-12: qty 5

## 2022-05-12 MED ORDER — BORTEZOMIB CHEMO SQ INJECTION 3.5 MG (2.5MG/ML)
1.3000 mg/m2 | Freq: Once | INTRAMUSCULAR | Status: AC
  Administered 2022-05-12: 2.5 mg via SUBCUTANEOUS
  Filled 2022-05-12: qty 1

## 2022-05-12 NOTE — Progress Notes (Signed)
Pt is approved for the $1000 [employer] grant.  

## 2022-05-12 NOTE — Patient Instructions (Signed)
[facility]  Discharge Instructions: Thank you for choosing [facility] to provide your oncology and hematology care.   If you have a lab appointment with the [facility], please go directly to the [facility] and check in at the registration area.   Wear comfortable clothing and clothing appropriate for easy access to any Portacath or PICC line.   We strive to give you quality time with your provider. You may need to reschedule your appointment if you arrive late (15 or more minutes).  Arriving late affects you and other patients whose appointments are after yours.  Also, if you miss three or more appointments without notifying the office, you may be dismissed from the clinic at the provider's discretion.      For prescription refill requests, have your pharmacy contact our office and allow 72 hours for refills to be completed.    Today you received the following chemotherapy and/or immunotherapy agent: Bortezomib (velcade)    To help prevent nausea and vomiting after your treatment, we encourage you to take your nausea medication as directed.  BELOW ARE SYMPTOMS THAT SHOULD BE REPORTED IMMEDIATELY: *FEVER GREATER THAN 100.4 F (38 C) OR HIGHER *CHILLS OR SWEATING *NAUSEA AND VOMITING THAT IS NOT CONTROLLED WITH YOUR NAUSEA MEDICATION *UNUSUAL SHORTNESS OF BREATH *UNUSUAL BRUISING OR BLEEDING *URINARY PROBLEMS (pain or burning when urinating, or frequent urination) *BOWEL PROBLEMS (unusual diarrhea, constipation, pain near the anus) TENDERNESS IN MOUTH AND THROAT WITH OR WITHOUT PRESENCE OF ULCERS (sore throat, sores in mouth, or a toothache) UNUSUAL RASH, SWELLING OR PAIN  UNUSUAL VAGINAL DISCHARGE OR ITCHING   Items with * indicate a potential emergency and should be followed up as soon as possible or go to the Emergency Department if any problems should occur.  Please show the CHEMOTHERAPY ALERT CARD or IMMUNOTHERAPY ALERT CARD  at check-in to the Emergency Department and triage nurse.  Should you have questions after your visit or need to cancel or reschedule your appointment, please contact [facility]  Dept: [phone]  and follow the prompts.  Office hours are 8:00 a.m. to 4:30 p.m. Monday - Friday. Please note that voicemails left after 4:00 p.m. may not be returned until the following business day.  We are closed weekends and major holidays. You have access to a nurse at all times for urgent questions. Please call the main number to the clinic Dept: [phone] and follow the prompts.   For any non-urgent questions, you may also contact your provider using MyChart. We now offer e-Visits for anyone 27 and older to request care online for non-urgent symptoms. For details visit [email or web address].   Also download the MyChart app! Go to the app store, search "MyChart", open the app, select [facility], and log in with your MyChart username and password.  Bortezomib Injection What is this medication? BORTEZOMIB (bor TEZ oh mib) treats lymphoma. It may also be used to treat multiple myeloma, a type of bone marrow cancer. It works by blocking a protein that causes cancer cells to grow and multiply. This helps to slow or stop the spread of cancer cells. This medicine may be used for other purposes; ask your health care provider or pharmacist if you have questions. COMMON BRAND NAME(S): Velcade What should I tell my care team before I take this medication? They need to know if you have any of these conditions: Dehydration Diabetes Heart disease Liver disease Tingling of  the fingers or toes or other nerve disorder An unusual or allergic reaction to bortezomib, other medications, foods, dyes, or preservatives If you or your partner are pregnant or trying to get pregnant Breastfeeding How should I use this medication? This medication is injected into a vein or under the skin.  It is given by your care team in a hospital or clinic setting. Talk to your care team about the use of this medication in children. Special care may be needed. Overdosage: If you think you have taken too much of this medicine contact a poison control center or emergency room at once. NOTE: This medicine is only for you. Do not share this medicine with others. What if I miss a dose? Keep appointments for follow-up doses. It is important not to miss your dose. Call your care team if you are unable to keep an appointment. What may interact with this medication? Ketoconazole Rifampin This list may not describe all possible interactions. Give your health care provider a list of all the medicines, herbs, non-prescription drugs, or dietary supplements you use. Also tell them if you smoke, drink alcohol, or use illegal drugs. Some items may interact with your medicine. What should I watch for while using this medication? Your condition will be monitored carefully while you are receiving this medication. You may need blood work while taking this medication. This medication may affect your coordination, reaction time, or judgment. Do not drive or operate machinery until you know how this medication affects you. Sit up or stand slowly to reduce the risk of dizzy or fainting spells. Drinking alcohol with this medication can increase the risk of these side effects. This medication may increase your risk of getting an infection. Call your care team for advice if you get a fever, chills, sore throat, or other symptoms of a cold or flu. Do not treat yourself. Try to avoid being around people who are sick. Check with your care team if you have severe diarrhea, nausea, and vomiting, or if you sweat a lot. The loss of too much body fluid may make it dangerous for you to take this medication. Talk to your care team if you may be pregnant. Serious birth defects can occur if you take this medication during pregnancy and  for 7 months after the last dose. You will need a negative pregnancy test before starting this medication. Contraception is recommended while taking this medication and for 7 months after the last dose. Your care team can help you find the option that works for you. If your partner can get pregnant, use a condom during sex while taking this medication and for 4 months after the last dose. Do not breastfeed while taking this medication and for 2 months after the last dose. This medication may cause infertility. Talk to your care team if you are concerned about your fertility. What side effects may I notice from receiving this medication? Side effects that you should report to your care team as soon as possible: Allergic reactions--skin rash, itching, hives, swelling of the face, lips, tongue, or throat Bleeding--bloody or black, tar-like stools, vomiting blood or brown material that looks like coffee grounds, red or dark brown urine, small red or purple spots on skin, unusual bruising or bleeding Bleeding in the brain--severe headache, stiff neck, confusion, dizziness, change in vision, numbness or weakness of the face, arm, or leg, trouble speaking, trouble walking, vomiting Bowel blockage--stomach cramping, unable to have a bowel movement or pass gas, loss of appetite,  vomiting Heart failure--shortness of breath, swelling of the ankles, feet, or hands, sudden weight gain, unusual weakness or fatigue Infection--fever, chills, cough, sore throat, wounds that don't heal, pain or trouble when passing urine, general feeling of discomfort or being unwell Liver injury--right upper belly pain, loss of appetite, nausea, light-colored stool, dark yellow or brown urine, yellowing skin or eyes, unusual weakness or fatigue Low blood pressure--dizziness, feeling faint or lightheaded, blurry vision Lung injury--shortness of breath or trouble breathing, cough, spitting up blood, chest pain, fever Pain, tingling, or  numbness in the hands or feet Severe or prolonged diarrhea Stomach pain, bloody diarrhea, pale skin, unusual weakness or fatigue, decrease in the amount of urine, which may be signs of hemolytic uremic syndrome Sudden and severe headache, confusion, change in vision, seizures, which may be signs of posterior reversible encephalopathy syndrome (PRES) TTP--purple spots on the skin or inside the mouth, pale skin, yellowing skin or eyes, unusual weakness or fatigue, fever, fast or irregular heartbeat, confusion, change in vision, trouble speaking, trouble walking Tumor lysis syndrome (TLS)--nausea, vomiting, diarrhea, decrease in the amount of urine, dark urine, unusual weakness or fatigue, confusion, muscle pain or cramps, fast or irregular heartbeat, joint pain Side effects that usually do not require medical attention (report to your care team if they continue or are bothersome): Constipation Diarrhea Fatigue Loss of appetite Nausea This list may not describe all possible side effects. Call your doctor for medical advice about side effects. You may report side effects to FDA at 1-800-FDA-1088. Where should I keep my medication? This medication is given in a hospital or clinic. It will not be stored at home. NOTE: This sheet is a summary. It may not cover all possible information. If you have questions about this medicine, talk to your doctor, pharmacist, or health care provider.  2023 Elsevier/Gold Standard (2021-07-30 00:00:00)

## 2022-05-12 NOTE — Progress Notes (Signed)
Per Dr. Lorenso Courier switch DEX '40mg'$  to '20mg'$  for patients velcade treatment.

## 2022-05-13 ENCOUNTER — Ambulatory Visit: Payer: Medicare Other

## 2022-05-13 ENCOUNTER — Other Ambulatory Visit: Payer: Medicare Other

## 2022-05-13 LAB — KAPPA/LAMBDA LIGHT CHAINS
Kappa free light chain: 228.6 mg/L — ABNORMAL HIGH (ref 3.3–19.4)
Kappa, lambda light chain ratio: 18.44 — ABNORMAL HIGH (ref 0.26–1.65)
Lambda free light chains: 12.4 mg/L (ref 5.7–26.3)

## 2022-05-13 LAB — BETA 2 MICROGLOBULIN, SERUM: Beta-2 Microglobulin: 1.4 mg/L (ref 0.6–2.4)

## 2022-05-18 LAB — MULTIPLE MYELOMA PANEL, SERUM
Albumin SerPl Elph-Mcnc: 4.1 g/dL (ref 2.9–4.4)
Albumin/Glob SerPl: 1.5 (ref 0.7–1.7)
Alpha 1: 0.3 g/dL (ref 0.0–0.4)
Alpha2 Glob SerPl Elph-Mcnc: 0.7 g/dL (ref 0.4–1.0)
B-Globulin SerPl Elph-Mcnc: 1 g/dL (ref 0.7–1.3)
Gamma Glob SerPl Elph-Mcnc: 0.7 g/dL (ref 0.4–1.8)
Globulin, Total: 2.8 g/dL (ref 2.2–3.9)
IgA: 86 mg/dL — ABNORMAL LOW (ref 87–352)
IgG (Immunoglobin G), Serum: 799 mg/dL (ref 586–1602)
IgM (Immunoglobulin M), Srm: 21 mg/dL — ABNORMAL LOW (ref 26–217)
Total Protein ELP: 6.9 g/dL (ref 6.0–8.5)

## 2022-05-19 ENCOUNTER — Inpatient Hospital Stay: Payer: Medicare Other

## 2022-05-19 ENCOUNTER — Other Ambulatory Visit: Payer: Self-pay

## 2022-05-19 ENCOUNTER — Inpatient Hospital Stay (HOSPITAL_BASED_OUTPATIENT_CLINIC_OR_DEPARTMENT_OTHER): Payer: Medicare Other | Admitting: Nurse Practitioner

## 2022-05-19 ENCOUNTER — Inpatient Hospital Stay: Payer: Medicare Other | Attending: Physician Assistant | Admitting: Physician Assistant

## 2022-05-19 VITALS — BP 148/83 | HR 95 | Temp 97.7°F | Resp 16 | Wt 170.4 lb

## 2022-05-19 DIAGNOSIS — R450 Nervousness: Secondary | ICD-10-CM

## 2022-05-19 DIAGNOSIS — M459 Ankylosing spondylitis of unspecified sites in spine: Secondary | ICD-10-CM | POA: Insufficient documentation

## 2022-05-19 DIAGNOSIS — M7989 Other specified soft tissue disorders: Secondary | ICD-10-CM | POA: Insufficient documentation

## 2022-05-19 DIAGNOSIS — Z803 Family history of malignant neoplasm of breast: Secondary | ICD-10-CM | POA: Insufficient documentation

## 2022-05-19 DIAGNOSIS — C9000 Multiple myeloma not having achieved remission: Secondary | ICD-10-CM

## 2022-05-19 DIAGNOSIS — E559 Vitamin D deficiency, unspecified: Secondary | ICD-10-CM | POA: Insufficient documentation

## 2022-05-19 DIAGNOSIS — Z9013 Acquired absence of bilateral breasts and nipples: Secondary | ICD-10-CM | POA: Diagnosis not present

## 2022-05-19 DIAGNOSIS — Z7982 Long term (current) use of aspirin: Secondary | ICD-10-CM | POA: Diagnosis not present

## 2022-05-19 DIAGNOSIS — Z5112 Encounter for antineoplastic immunotherapy: Secondary | ICD-10-CM | POA: Insufficient documentation

## 2022-05-19 DIAGNOSIS — R7989 Other specified abnormal findings of blood chemistry: Secondary | ICD-10-CM | POA: Diagnosis not present

## 2022-05-19 DIAGNOSIS — R530 Neoplastic (malignant) related fatigue: Secondary | ICD-10-CM | POA: Diagnosis not present

## 2022-05-19 DIAGNOSIS — I10 Essential (primary) hypertension: Secondary | ICD-10-CM | POA: Diagnosis not present

## 2022-05-19 DIAGNOSIS — R002 Palpitations: Secondary | ICD-10-CM | POA: Diagnosis not present

## 2022-05-19 DIAGNOSIS — Z7961 Long term (current) use of immunomodulator: Secondary | ICD-10-CM | POA: Insufficient documentation

## 2022-05-19 DIAGNOSIS — Z79899 Other long term (current) drug therapy: Secondary | ICD-10-CM | POA: Insufficient documentation

## 2022-05-19 DIAGNOSIS — K219 Gastro-esophageal reflux disease without esophagitis: Secondary | ICD-10-CM | POA: Insufficient documentation

## 2022-05-19 DIAGNOSIS — M48061 Spinal stenosis, lumbar region without neurogenic claudication: Secondary | ICD-10-CM | POA: Diagnosis not present

## 2022-05-19 DIAGNOSIS — Z515 Encounter for palliative care: Secondary | ICD-10-CM

## 2022-05-19 DIAGNOSIS — G893 Neoplasm related pain (acute) (chronic): Secondary | ICD-10-CM

## 2022-05-19 DIAGNOSIS — M792 Neuralgia and neuritis, unspecified: Secondary | ICD-10-CM

## 2022-05-19 LAB — CBC WITH DIFFERENTIAL (CANCER CENTER ONLY)
Abs Immature Granulocytes: 0.03 10*3/uL (ref 0.00–0.07)
Basophils Absolute: 0 10*3/uL (ref 0.0–0.1)
Basophils Relative: 0 %
Eosinophils Absolute: 0.1 10*3/uL (ref 0.0–0.5)
Eosinophils Relative: 2 %
HCT: 34.2 % — ABNORMAL LOW (ref 36.0–46.0)
Hemoglobin: 11.6 g/dL — ABNORMAL LOW (ref 12.0–15.0)
Immature Granulocytes: 1 %
Lymphocytes Relative: 15 %
Lymphs Abs: 0.8 10*3/uL (ref 0.7–4.0)
MCH: 30.1 pg (ref 26.0–34.0)
MCHC: 33.9 g/dL (ref 30.0–36.0)
MCV: 88.6 fL (ref 80.0–100.0)
Monocytes Absolute: 1 10*3/uL (ref 0.1–1.0)
Monocytes Relative: 18 %
Neutro Abs: 3.5 10*3/uL (ref 1.7–7.7)
Neutrophils Relative %: 64 %
Platelet Count: 200 10*3/uL (ref 150–400)
RBC: 3.86 MIL/uL — ABNORMAL LOW (ref 3.87–5.11)
RDW: 13.5 % (ref 11.5–15.5)
WBC Count: 5.5 10*3/uL (ref 4.0–10.5)
nRBC: 0 % (ref 0.0–0.2)

## 2022-05-19 LAB — CMP (CANCER CENTER ONLY)
ALT: 19 U/L (ref 0–44)
AST: 11 U/L — ABNORMAL LOW (ref 15–41)
Albumin: 4.5 g/dL (ref 3.5–5.0)
Alkaline Phosphatase: 80 U/L (ref 38–126)
Anion gap: 9 (ref 5–15)
BUN: 15 mg/dL (ref 6–20)
CO2: 26 mmol/L (ref 22–32)
Calcium: 9.4 mg/dL (ref 8.9–10.3)
Chloride: 106 mmol/L (ref 98–111)
Creatinine: 0.86 mg/dL (ref 0.44–1.00)
GFR, Estimated: >60 mL/min (ref >60)
Glucose, Bld: 99 mg/dL (ref 70–99)
Potassium: 3.2 mmol/L — ABNORMAL LOW (ref 3.5–5.1)
Sodium: 141 mmol/L (ref 135–145)
Total Bilirubin: 0.6 mg/dL (ref 0.3–1.2)
Total Protein: 7.6 g/dL (ref 6.5–8.1)

## 2022-05-19 MED ORDER — SODIUM CHLORIDE 0.9 % IV SOLN: INTRAVENOUS | Status: AC

## 2022-05-19 MED ORDER — BORTEZOMIB CHEMO SQ INJECTION 3.5 MG (2.5MG/ML)
1.3000 mg/m2 | Freq: Once | INTRAMUSCULAR | Status: AC
  Administered 2022-05-19: 2.5 mg via SUBCUTANEOUS
  Filled 2022-05-19: qty 1

## 2022-05-19 MED ORDER — DEXAMETHASONE 4 MG PO TABS
12.0000 mg | ORAL_TABLET | Freq: Once | ORAL | Status: AC
  Administered 2022-05-19: 12 mg via ORAL
  Filled 2022-05-19: qty 3

## 2022-05-19 NOTE — Progress Notes (Unsigned)
[facility]  [phone] [phone]   Name: Sonya Franklin Date: 05/19/2022 MRN: [id number]  DOB: 05-15-1965  Patient Care Team: Elby Showers, MD as PCP - General (Internal Medicine)    REASON FOR CONSULTATION: Sonya Franklin is a 57 y.o. 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 Palliative ask to see for symptom management and goals of care.    SOCIAL HISTORY:     reports that she has never smoked. She has never used smokeless tobacco. She reports that she does not drink alcohol and does not use drugs.  ADVANCE DIRECTIVES:  None on file  CODE STATUS: full code  PAST MEDICAL HISTORY: Past Medical History:  Diagnosis Date   Ankylosing spondylitis ([facility])    Anxiety    Arthritis    Breast cancer ([facility])    right breast   Bursitis of hip, right    Cardiac arrhythmia    Carotid stenosis 08/26/2021   GE reflux    Hiatal hernia    Hypertension    Hyperthyroidism    Ischemic bowel disease ([facility])    Multiple myeloma not having achieved remission ([facility])    Prediabetes 08/26/2021   Snoring 03/21/2014   Vitamin D deficiency     PAST SURGICAL HISTORY:  Past Surgical History:  Procedure Laterality Date   ABDOMINAL HYSTERECTOMY     BREAST LUMPECTOMY     right   MASTECTOMY     double    HEMATOLOGY/ONCOLOGY HISTORY:  Oncology History  Multiple myeloma not having achieved remission ([facility] [facility])  04/28/2022 Initial Diagnosis   Multiple myeloma not having achieved remission ([facility])   05/05/2022 -  Chemotherapy   Patient is on Treatment Plan : MYELOMA  RVD SQ q21d x 4 cycles       ALLERGIES:  is allergic to shellfish allergy, blue dyes (parenteral), and hydrochlorothiazide.  MEDICATIONS:  Current Outpatient Medications  Medication Sig Dispense Refill   oxyCODONE (OXY IR/ROXICODONE) 5 MG immediate release tablet Take 1  tablet (5 mg total) by mouth every 4 (four) hours as needed for severe pain. (Patient not taking: Reported on 05/05/2022) 15 tablet 0   acyclovir (ZOVIRAX) 400 MG tablet Take 1 tablet (400 mg total) by mouth 2 (two) times daily. 60 tablet 3   ALPRAZolam (XANAX) 0.25 MG tablet TAKE 1 TABLET (0.25 MG TOTAL) BY MOUTH 2 (TWO) TIMES DAILY AS NEEDED FOR ANXIETY. 60 tablet 5   amLODipine (NORVASC) 5 MG tablet TAKE 1 TABLET (5 MG TOTAL) BY MOUTH DAILY. 90 tablet 2   aspirin EC 81 MG tablet Take 81 mg by mouth daily. Swallow whole.     DULoxetine (CYMBALTA) 30 MG capsule Take 2 capsules (60 mg total) by mouth daily. Take 30 mg (1 tablet), if no side effects after 1 week, increase to 60 mg (2 tablets) thereafter 60 capsule 5   furosemide (LASIX) 20 MG tablet Take 1 tablet (20 mg total) by mouth daily as needed. 10 tablet 1   hyoscyamine (ANASPAZ) 0.125 MG TBDP disintergrating tablet Place 1 tablet (0.125 mg total) under the tongue every 4 (four) hours as needed for cramping. 30 tablet 0   ibuprofen (ADVIL) 800 MG tablet TAKE 1 TABLET BY MOUTH EVERY 8 HOURS AS NEEDED FOR PAIN (Patient not taking: Reported on 05/05/2022) 180 tablet 3   lenalidomide (REVLIMID) 25 MG capsule Take 1 capsule (25 mg total) by mouth daily. Take  for 14 days on, then 7 days off. Repeat every 21 days. 14 capsule 0   lidocaine (LIDODERM) 5 % Place 1 patch onto the skin daily. Remove & Discard patch within 12 hours or as directed by MD 30 patch 0   olopatadine (PATANOL) 0.1 % ophthalmic solution Place 1 drop into both eyes 2 (two) times daily as needed. 5 mL PRN   ondansetron (ZOFRAN) 4 MG tablet Take 1 tablet (4 mg total) by mouth every 8 (eight) hours as needed for nausea or vomiting. 20 tablet 0   pantoprazole (PROTONIX) 40 MG tablet TAKE 1 TABLET BY MOUTH EVERY DAY 90 tablet 3   saccharomyces boulardii (FLORASTOR) 250 MG capsule Take 1 capsule (250 mg total) by mouth 2 (two) times daily. (Patient taking differently: Take 250 mg by mouth as  needed.) 40 capsule 0   triamcinolone cream (KENALOG) 0.1 % Apply 1 Application topically 3 (three) times daily. 30 g 1   No current facility-administered medications for this visit.    VITAL SIGNS: There were no vitals taken for this visit. There were no vitals filed for this visit.  Estimated body mass index is 34.01 kg/m as calculated from the following:   Height as of 05/10/22: '5\' 1"'$  (1.549 m).   Weight as of 05/10/22: 180 lb (81.6 kg).  LABS: CBC:    Component Value Date/Time   WBC 7.8 05/12/2022 1337   WBC 5.4 05/10/2022 1220   HGB 10.7 (L) 05/12/2022 1337   HGB 12.4 03/18/2011 1402   HCT 31.8 (L) 05/12/2022 1337   HCT 36.6 03/18/2011 1402   PLT 236 05/12/2022 1337   PLT 241 03/18/2011 1402   MCV 88.3 05/12/2022 1337   MCV 86.0 03/18/2011 1402   NEUTROABS 5.9 05/12/2022 1337   NEUTROABS 3.9 03/18/2011 1402   LYMPHSABS 1.2 05/12/2022 1337   LYMPHSABS 1.5 03/18/2011 1402   MONOABS 0.5 05/12/2022 1337   MONOABS 0.5 03/18/2011 1402   EOSABS 0.2 05/12/2022 1337   EOSABS 0.1 03/18/2011 1402   BASOSABS 0.0 05/12/2022 1337   BASOSABS 0.0 03/18/2011 1402   Comprehensive Metabolic Panel:    Component Value Date/Time   NA 141 05/12/2022 1337   K 3.5 05/12/2022 1337   CL 105 05/12/2022 1337   CO2 28 05/12/2022 1337   BUN 11 05/12/2022 1337   CREATININE 0.65 05/12/2022 1337   CREATININE 0.63 10/30/2021 1238   GLUCOSE 84 05/12/2022 1337   CALCIUM 9.0 05/12/2022 1337   AST 18 05/12/2022 1337   ALT 25 05/12/2022 1337   ALKPHOS 78 05/12/2022 1337   BILITOT 0.4 05/12/2022 1337   PROT 7.2 05/12/2022 1337   ALBUMIN 4.5 05/12/2022 1337    RADIOGRAPHIC STUDIES:  PERFORMANCE STATUS (ECOG) : {CHL ONC ECOG [id number]  Review of Systems Unless otherwise noted, a complete review of systems is negative.  Physical Exam General: NAD Cardiovascular: regular rate and rhythm Pulmonary: clear ant fields Abdomen: soft, nontender, + bowel sounds GU: no suprapubic  tenderness Extremities: no edema, no joint deformities Skin: no rashes Neurological:  IMPRESSION: *** I introduced myself, [place] RN, and Palliative's role in collaboration with the oncology team. Concept of Palliative Care was introduced as specialized medical care for people and their families living with serious illness.  It focuses on providing relief from the symptoms and stress of a serious illness.  The goal is to improve quality of life for both the patient and the family. Values and goals of care important to patient and family were attempted  to be elicited.    We discussed *** current illness and what it means in the larger context of *** on-going co-morbidities. Natural disease trajectory and expectations were discussed.  I discussed the importance of continued conversation with family and their medical providers regarding overall plan of care and treatment options, ensuring decisions are within the context of the patients values and GOCs.  PLAN: Established therapeutic relationship. Education provided on palliative's role in collaboration with their Oncology/Radiation team. I will plan to see patient back in 2-4 weeks in collaboration to other oncology appointments.    Patient expressed understanding and was in agreement with this plan. She also understands that She can call the clinic at any time with any questions, concerns, or complaints.   Thank you for your referral and allowing Palliative to assist in     Sonya Franklin's care.   Number and complexity of problems addressed: ***HIGH - 1 or more chronic illnesses with SEVERE exacerbation, progression, or side effects of treatment - advanced cancer, pain. Any controlled substances utilized were prescribed in the context of palliative care.  Time Total: ***  Visit consisted of counseling and education dealing with the complex and emotionally intense issues of symptom management and palliative care in the setting of  serious and potentially life-threatening illness.Greater than 50%  of this time was spent counseling and coordinating care related to the above assessment and plan.  Signed by: Alda Lea, AGPCNP-BC Palliative Medicine Team/[place]   *Please note that this is a verbal dictation therefore any spelling or grammatical errors are due to the "[employer]" system interpretation.

## 2022-05-19 NOTE — Patient Instructions (Signed)
[facility]  Discharge Instructions: Thank you for choosing [facility] to provide your oncology and hematology care.   If you have a lab appointment with the [facility], please go directly to the [facility] and check in at the registration area.   Wear comfortable clothing and clothing appropriate for easy access to any Portacath or PICC line.   We strive to give you quality time with your provider. You may need to reschedule your appointment if you arrive late (15 or more minutes).  Arriving late affects you and other patients whose appointments are after yours.  Also, if you miss three or more appointments without notifying the office, you may be dismissed from the clinic at the provider's discretion.      For prescription refill requests, have your pharmacy contact our office and allow 72 hours for refills to be completed.    Today you received the following chemotherapy and/or immunotherapy agent: Bortezomib (Velcade)   To help prevent nausea and vomiting after your treatment, we encourage you to take your nausea medication as directed.  BELOW ARE SYMPTOMS THAT SHOULD BE REPORTED IMMEDIATELY: *FEVER GREATER THAN 100.4 F (38 C) OR HIGHER *CHILLS OR SWEATING *NAUSEA AND VOMITING THAT IS NOT CONTROLLED WITH YOUR NAUSEA MEDICATION *UNUSUAL SHORTNESS OF BREATH *UNUSUAL BRUISING OR BLEEDING *URINARY PROBLEMS (pain or burning when urinating, or frequent urination) *BOWEL PROBLEMS (unusual diarrhea, constipation, pain near the anus) TENDERNESS IN MOUTH AND THROAT WITH OR WITHOUT PRESENCE OF ULCERS (sore throat, sores in mouth, or a toothache) UNUSUAL RASH, SWELLING OR PAIN  UNUSUAL VAGINAL DISCHARGE OR ITCHING   Items with * indicate a potential emergency and should be followed up as soon as possible or go to the Emergency Department if any problems should occur.  Please show the CHEMOTHERAPY ALERT CARD or IMMUNOTHERAPY ALERT CARD  at check-in to the Emergency Department and triage nurse.  Should you have questions after your visit or need to cancel or reschedule your appointment, please contact [facility]  Dept: [phone]  and follow the prompts.  Office hours are 8:00 a.m. to 4:30 p.m. Monday - Friday. Please note that voicemails left after 4:00 p.m. may not be returned until the following business day.  We are closed weekends and major holidays. You have access to a nurse at all times for urgent questions. Please call the main number to the clinic Dept: [phone] and follow the prompts.   For any non-urgent questions, you may also contact your provider using MyChart. We now offer e-Visits for anyone 69 and older to request care online for non-urgent symptoms. For details visit [email or web address].   Also download the MyChart app! Go to the app store, search "MyChart", open the app, select [facility], and log in with your MyChart username and password.  Bortezomib Injection What is this medication? BORTEZOMIB (bor TEZ oh mib) treats lymphoma. It may also be used to treat multiple myeloma, a type of bone marrow cancer. It works by blocking a protein that causes cancer cells to grow and multiply. This helps to slow or stop the spread of cancer cells. This medicine may be used for other purposes; ask your health care provider or pharmacist if you have questions. COMMON BRAND NAME(S): Velcade What should I tell my care team before I take this medication? They need to know if you have any of these conditions: Dehydration Diabetes Heart disease Liver disease Tingling of the  fingers or toes or other nerve disorder An unusual or allergic reaction to bortezomib, other medications, foods, dyes, or preservatives If you or your partner are pregnant or trying to get pregnant Breastfeeding How should I use this medication? This medication is injected into a vein or under the skin.  It is given by your care team in a hospital or clinic setting. Talk to your care team about the use of this medication in children. Special care may be needed. Overdosage: If you think you have taken too much of this medicine contact a poison control center or emergency room at once. NOTE: This medicine is only for you. Do not share this medicine with others. What if I miss a dose? Keep appointments for follow-up doses. It is important not to miss your dose. Call your care team if you are unable to keep an appointment. What may interact with this medication? Ketoconazole Rifampin This list may not describe all possible interactions. Give your health care provider a list of all the medicines, herbs, non-prescription drugs, or dietary supplements you use. Also tell them if you smoke, drink alcohol, or use illegal drugs. Some items may interact with your medicine. What should I watch for while using this medication? Your condition will be monitored carefully while you are receiving this medication. You may need blood work while taking this medication. This medication may affect your coordination, reaction time, or judgment. Do not drive or operate machinery until you know how this medication affects you. Sit up or stand slowly to reduce the risk of dizzy or fainting spells. Drinking alcohol with this medication can increase the risk of these side effects. This medication may increase your risk of getting an infection. Call your care team for advice if you get a fever, chills, sore throat, or other symptoms of a cold or flu. Do not treat yourself. Try to avoid being around people who are sick. Check with your care team if you have severe diarrhea, nausea, and vomiting, or if you sweat a lot. The loss of too much body fluid may make it dangerous for you to take this medication. Talk to your care team if you may be pregnant. Serious birth defects can occur if you take this medication during pregnancy and  for 7 months after the last dose. You will need a negative pregnancy test before starting this medication. Contraception is recommended while taking this medication and for 7 months after the last dose. Your care team can help you find the option that works for you. If your partner can get pregnant, use a condom during sex while taking this medication and for 4 months after the last dose. Do not breastfeed while taking this medication and for 2 months after the last dose. This medication may cause infertility. Talk to your care team if you are concerned about your fertility. What side effects may I notice from receiving this medication? Side effects that you should report to your care team as soon as possible: Allergic reactions--skin rash, itching, hives, swelling of the face, lips, tongue, or throat Bleeding--bloody or black, tar-like stools, vomiting blood or brown material that looks like coffee grounds, red or dark brown urine, small red or purple spots on skin, unusual bruising or bleeding Bleeding in the brain--severe headache, stiff neck, confusion, dizziness, change in vision, numbness or weakness of the face, arm, or leg, trouble speaking, trouble walking, vomiting Bowel blockage--stomach cramping, unable to have a bowel movement or pass gas, loss of appetite, vomiting  Heart failure--shortness of breath, swelling of the ankles, feet, or hands, sudden weight gain, unusual weakness or fatigue Infection--fever, chills, cough, sore throat, wounds that don't heal, pain or trouble when passing urine, general feeling of discomfort or being unwell Liver injury--right upper belly pain, loss of appetite, nausea, light-colored stool, dark yellow or brown urine, yellowing skin or eyes, unusual weakness or fatigue Low blood pressure--dizziness, feeling faint or lightheaded, blurry vision Lung injury--shortness of breath or trouble breathing, cough, spitting up blood, chest pain, fever Pain, tingling, or  numbness in the hands or feet Severe or prolonged diarrhea Stomach pain, bloody diarrhea, pale skin, unusual weakness or fatigue, decrease in the amount of urine, which may be signs of hemolytic uremic syndrome Sudden and severe headache, confusion, change in vision, seizures, which may be signs of posterior reversible encephalopathy syndrome (PRES) TTP--purple spots on the skin or inside the mouth, pale skin, yellowing skin or eyes, unusual weakness or fatigue, fever, fast or irregular heartbeat, confusion, change in vision, trouble speaking, trouble walking Tumor lysis syndrome (TLS)--nausea, vomiting, diarrhea, decrease in the amount of urine, dark urine, unusual weakness or fatigue, confusion, muscle pain or cramps, fast or irregular heartbeat, joint pain Side effects that usually do not require medical attention (report to your care team if they continue or are bothersome): Constipation Diarrhea Fatigue Loss of appetite Nausea This list may not describe all possible side effects. Call your doctor for medical advice about side effects. You may report side effects to FDA at 1-800-FDA-1088. Where should I keep my medication? This medication is given in a hospital or clinic. It will not be stored at home. NOTE: This sheet is a summary. It may not cover all possible information. If you have questions about this medicine, talk to your doctor, pharmacist, or health care provider.  2023 Elsevier/Gold Standard (2021-07-30 00:00:00)  Rehydration, Adult Rehydration is the replacement of fluids, salts, and minerals in the body (electrolytes) that are lost during dehydration. Dehydration is when there is not enough water or other fluids in the body. This happens when you lose more fluids than you take in. Common causes of dehydration include: Not drinking enough fluids. This can occur when you are ill or doing activities that require a lot of energy, especially in hot weather. Conditions that cause  loss of water or other fluids. These include diarrhea, vomiting, sweating, and urinating a lot. Other illnesses, such as fever or infection. Certain medicines, such as those that remove excess fluid from the body (diuretics). Symptoms of mild or moderate dehydration may include thirst, dry lips and mouth, and dizziness. Symptoms of severe dehydration may include increased heart rate, confusion, fainting, and not urinating. In severe cases, you may need to get fluids through an IV at the hospital. For mild or moderate cases, you can usually rehydrate at home by drinking certain fluids as told by your health care provider. What are the risks? Your health care provider will talk with you about risks. Your health care provider will talk with you about risks. This may include taking in too much fluid (overhydration). This is rare. Overhydration can cause an imbalance of electrolytes in the body, kidney failure, or a decrease in salt (sodium) levels in the body. Supplies needed: You will need an oral rehydration solution (ORS) if your health care provider tells you to use one. This is a drink to treat dehydration. It can be found in pharmacies and retail stores. How to rehydrate Fluids Follow instructions from your health care  provider about what to drink. The kind of fluid and the amount you should drink depend on your condition. In general, you should choose drinks that you prefer. If told by your health care provider, drink an ORS. Make an ORS by following instructions on the package. Start by drinking small amounts, about  cup (120 mL) every 5-10 minutes. Slowly increase how much you drink until you have taken in the amount recommended by your health care provider. Drink enough clear fluids to keep your urine pale yellow. If you were told to drink an ORS, finish it first, then start slowly drinking other clear fluids. Drink fluids such as: Water. This includes sparkling and flavored water. Drinking  only water can lead to having too little sodium in your body (hyponatremia). Follow the advice of your health care provider. Water from ice chips you suck on. Fruit juice with water added to it (diluted). Sports drinks. Hot or cold herbal teas. Broth-based soups. Milk or milk products. Food Follow instructions from your health care provider about what to eat while you rehydrate. Your health care provider may recommend that you slowly begin eating regular foods in small amounts. Eat foods that contain a healthy balance of electrolytes, such as bananas, oranges, potatoes, tomatoes, and spinach. Avoid foods that are greasy or contain a lot of sugar. In some cases, you may get nutrition through a feeding tube that is passed through your nose and into your stomach (nasogastric tube, or NG tube). This may be done if you have uncontrolled vomiting or diarrhea. Drinks to avoid  Certain drinks may make dehydration worse. While you rehydrate, avoid drinking alcohol. How to tell if you are recovering from dehydration You may be getting better if: You are urinating more often than before you started rehydrating. Your urine is pale yellow. Your energy level improves. You vomit less often. You have diarrhea less often. Your appetite improves or returns to normal. You feel less dizzy or light-headed. Your skin tone and color start to look more normal. Follow these instructions at home: Take over-the-counter and prescription medicines only as told by your health care provider. Do not take sodium tablets. Doing this can lead to having too much sodium in your body (hypernatremia). Contact a health care provider if: You continue to have symptoms of mild or moderate dehydration, such as: Thirst. Dry lips. Slightly dry mouth. Dizziness. Dark urine or less urine than normal. Muscle cramps. You continue to vomit or have diarrhea. Get help right away if: You have symptoms of dehydration that get  worse. You have a fever. You have a severe headache. You have been vomiting and have problems, such as: Your vomiting gets worse or does not go away. Your vomit includes blood or green matter (bile). You cannot eat or drink without vomiting. You have problems with urination or bowel movements, such as: Diarrhea that gets worse or does not go away. Blood in your stool (feces). This may cause stool to look black and tarry. Not urinating, or urinating only a small amount of very dark urine, within 6-8 hours. You have trouble breathing. You have symptoms that get worse with treatment. These symptoms may be an emergency. Get help right away. Call 911. Do not wait to see if the symptoms will go away. Do not drive yourself to the hospital. This information is not intended to replace advice given to you by your health care provider. Make sure you discuss any questions you have with your health care provider. Document  Revised: 07/14/2021 Document Reviewed: 07/14/2021 Elsevier Patient Education  [place].

## 2022-05-19 NOTE — Progress Notes (Signed)
[place] [phone]   [phone]  PROGRESS NOTE  Patient Care Team: Elby Showers, MD as PCP - General (Internal Medicine)  Hematological/Oncological History # Multiple Myeloma with Plasmacytoma 03/03/2022: established with the [facility] in Oncology 04/15/2022: CT biopsy and bone marrow biopsy confirmed the presence of a plasmacytoma and bone marrow involvement of multiple myeloma.  05/05/2022: Cycle 1 of VRD therapy  Interval History:  Sonya Franklin 57 y.o. female with medical history significant for recently diagnosed multiple myeloma with plasmacytoma who presents for a follow up visit. The patient's last visit was on 05/05/2022. She presents today to start Cycle 1, Day 15 of VRD therapy.   On exam today Sonya Franklin reports that she continues to feel jittery and occasional palpitations. She did not notice a large difference with the dose reduction of dexamethasone from 40 mg to 20 mg with the treatment last week. She does have some fatigue but is able to complete her ADLs. She denies nausea, vomiting or abdominal pain. She has persistent right hip pain that is not well controlled with tylenol. She denies easy bruising or signs of active bleeding. She denies fevers, chills, sweats, shortness of breath, chest pain or cough. She has no other complaints. Rest of the 10 point ROS is below.   MEDICAL HISTORY:  Past Medical History:  Diagnosis Date   Ankylosing spondylitis ([place])    Anxiety    Arthritis    Breast cancer ([place])    right breast   Bursitis of hip, right    Cardiac arrhythmia    Carotid stenosis 08/26/2021   GE reflux    Hiatal hernia    Hypertension    Hyperthyroidism    Ischemic bowel disease ([place])    Multiple myeloma not having achieved remission ([place])    Prediabetes 08/26/2021   Snoring 03/21/2014   Vitamin D deficiency     SURGICAL HISTORY: Past Surgical History:  Procedure Laterality Date   ABDOMINAL HYSTERECTOMY     BREAST  LUMPECTOMY     right   MASTECTOMY     double    SOCIAL HISTORY: Social History   Socioeconomic History   Marital status: Divorced    Spouse name: Not on file   Number of children: 1   Years of education: Not on file   Highest education level: Not on file  Occupational History    Employer: UNEMPLOYED  Tobacco Use   Smoking status: Never   Smokeless tobacco: Never  Vaping Use   Vaping Use: Never used  Substance and Sexual Activity   Alcohol use: No   Drug use: No   Sexual activity: Yes  Other Topics Concern   Not on file  Social History Narrative   Caffeine occasional drinker.   Not working/disabled.  12th grader. Divorced, one kids.       Social history: She receives disability benefits for history of breast cancer 2007 and ankylosing spondylitis.  She resides with special needs son.  She is divorced.  Non-smoker.  No alcohol consumption.       Family history: Father with history of prostate cancer.  Patient's mother was diagnosed with breast cancer at age 50.  Maternal aunt with history of breast cancer.  3 brothers.   Right handed   Caffeine occas   One level stays on this level       Social Determinants of Health   Financial Resource Strain: Low Risk  (08/26/2021)   Overall Financial Resource Strain (CARDIA)  Difficulty of Paying Living Expenses: Not hard at all  Food Insecurity: No Food Insecurity (04/21/2022)   Hunger Vital Sign    Worried About Running Out of Food in the Last Year: Never true    Ran Out of Food in the Last Year: Never true  Transportation Needs: No Transportation Needs (08/26/2021)   PRAPARE - Hydrologist (Medical): No    Lack of Transportation (Non-Medical): No  Physical Activity: Inactive (08/26/2021)   Exercise Vital Sign    Days of Exercise per Week: 0 days    Minutes of Exercise per Session: 0 min  Stress: Not on file  Social Connections: Not on file  Intimate Partner Violence: Not At Risk (04/21/2022)    Humiliation, Afraid, Rape, and Kick questionnaire    Fear of Current or Ex-Partner: No    Emotionally Abused: No    Physically Abused: No    Sexually Abused: No    FAMILY HISTORY: Family History  Problem Relation Age of Onset   Diabetes Mother    Hypertension Mother    Hyperlipidemia Mother    High blood pressure Father    High Cholesterol Father    Colitis Brother    Goiter Maternal Grandmother    Breast cancer Maternal Aunt     ALLERGIES:  is allergic to shellfish allergy, blue dyes (parenteral), and hydrochlorothiazide.  MEDICATIONS:  Current Outpatient Medications  Medication Sig Dispense Refill   oxyCODONE (OXY IR/ROXICODONE) 5 MG immediate release tablet Take 1 tablet (5 mg total) by mouth every 4 (four) hours as needed for severe pain. (Patient not taking: Reported on 05/05/2022) 15 tablet 0   acyclovir (ZOVIRAX) 400 MG tablet Take 1 tablet (400 mg total) by mouth 2 (two) times daily. 60 tablet 3   ALPRAZolam (XANAX) 0.25 MG tablet TAKE 1 TABLET (0.25 MG TOTAL) BY MOUTH 2 (TWO) TIMES DAILY AS NEEDED FOR ANXIETY. 60 tablet 5   amLODipine (NORVASC) 5 MG tablet TAKE 1 TABLET (5 MG TOTAL) BY MOUTH DAILY. 90 tablet 2   aspirin EC 81 MG tablet Take 81 mg by mouth daily. Swallow whole.     DULoxetine (CYMBALTA) 30 MG capsule Take 2 capsules (60 mg total) by mouth daily. Take 30 mg (1 tablet), if no side effects after 1 week, increase to 60 mg (2 tablets) thereafter 60 capsule 5   furosemide (LASIX) 20 MG tablet Take 1 tablet (20 mg total) by mouth daily as needed. 10 tablet 1   hyoscyamine (ANASPAZ) 0.125 MG TBDP disintergrating tablet Place 1 tablet (0.125 mg total) under the tongue every 4 (four) hours as needed for cramping. 30 tablet 0   ibuprofen (ADVIL) 800 MG tablet TAKE 1 TABLET BY MOUTH EVERY 8 HOURS AS NEEDED FOR PAIN (Patient not taking: Reported on 05/05/2022) 180 tablet 3   lenalidomide (REVLIMID) 25 MG capsule Take 1 capsule (25 mg total) by mouth daily. Take for 14 days  on, then 7 days off. Repeat every 21 days. 14 capsule 0   lidocaine (LIDODERM) 5 % Place 1 patch onto the skin daily. Remove & Discard patch within 12 hours or as directed by MD 30 patch 0   olopatadine (PATANOL) 0.1 % ophthalmic solution Place 1 drop into both eyes 2 (two) times daily as needed. 5 mL PRN   ondansetron (ZOFRAN) 4 MG tablet Take 1 tablet (4 mg total) by mouth every 8 (eight) hours as needed for nausea or vomiting. 20 tablet 0   pantoprazole (PROTONIX) 40  MG tablet TAKE 1 TABLET BY MOUTH EVERY DAY 90 tablet 3   saccharomyces boulardii (FLORASTOR) 250 MG capsule Take 1 capsule (250 mg total) by mouth 2 (two) times daily. (Patient taking differently: Take 250 mg by mouth as needed.) 40 capsule 0   triamcinolone cream (KENALOG) 0.1 % Apply 1 Application topically 3 (three) times daily. 30 g 1   No current facility-administered medications for this visit.    REVIEW OF SYSTEMS:   Constitutional: ( - ) fevers, ( - )  chills , ( - ) night sweats Eyes: ( - ) blurriness of vision, ( - ) double vision, ( - ) watery eyes Ears, nose, mouth, throat, and face: ( - ) mucositis, ( - ) sore throat Respiratory: ( - ) cough, ( - ) dyspnea, ( - ) wheezes Cardiovascular: ( - ) palpitation, ( - ) chest discomfort, ( - ) lower extremity swelling Gastrointestinal:  ( - ) nausea, ( - ) heartburn, ( - ) change in bowel habits Skin: ( - ) abnormal skin rashes Lymphatics: ( - ) new lymphadenopathy, ( - ) easy bruising Neurological: ( - ) numbness, ( - ) tingling, ( - ) new weaknesses Behavioral/Psych: ( - ) mood change, ( - ) new changes  All other systems were reviewed with the patient and are negative.  PHYSICAL EXAMINATION: ECOG PERFORMANCE STATUS: 1 - Symptomatic but completely ambulatory  Vitals:   05/19/22 1049  BP: (!) 148/83  Pulse: 95  Resp: 16  Temp: 97.7 F (36.5 C)  SpO2: 99%   Filed Weights   05/19/22 1049  Weight: 170 lb 6.4 oz (77.3 kg)    GENERAL: Well-appearing  middle-aged African-American female, alert, no distress and comfortable SKIN: skin color, texture, turgor are normal, no rashes or significant lesions EYES: conjunctiva are pink and non-injected, sclera clear LUNGS: clear to auscultation and percussion with normal breathing effort HEART: regular rate & rhythm and no murmurs and no lower extremity edema Musculoskeletal: no cyanosis of digits and no clubbing  PSYCH: alert & oriented x 3, fluent speech NEURO: no focal motor/sensory deficits  LABORATORY DATA:  I have reviewed the data as listed    Latest Ref Rng & Units 05/19/2022   10:23 AM 05/12/2022    1:37 PM 05/10/2022   12:20 PM  CBC  WBC 4.0 - 10.5 K/uL 5.5  7.8  5.4   Hemoglobin 12.0 - 15.0 g/dL 11.6  10.7  10.8   Hematocrit 36.0 - 46.0 % 34.2  31.8  33.8   Platelets 150 - 400 K/uL 200  236  222        Latest Ref Rng & Units 05/12/2022    1:37 PM 05/10/2022   12:20 PM 05/05/2022    1:42 PM  CMP  Glucose 70 - 99 mg/dL 84  90  100   BUN 6 - 20 mg/dL '11  10  11   '$ Creatinine 0.44 - 1.00 mg/dL 0.65  0.64  0.67   Sodium 135 - 145 mmol/L 141  142  142   Potassium 3.5 - 5.1 mmol/L 3.5  3.5  3.5   Chloride 98 - 111 mmol/L 105  106  107   CO2 22 - 32 mmol/L '28  25  28   '$ Calcium 8.9 - 10.3 mg/dL 9.0  8.9  9.5   Total Protein 6.5 - 8.1 g/dL 7.2  6.7  7.1   Total Bilirubin 0.3 - 1.2 mg/dL 0.4  0.5  0.3   Alkaline  Phos 38 - 126 U/L 78  64  83   AST 15 - 41 U/L '18  16  12   '$ ALT 0 - 44 U/L '25  25  15     '$ Lab Results  Component Value Date   MPROTEIN Not Observed 05/12/2022   Lab Results  Component Value Date   KPAFRELGTCHN 228.6 (H) 05/12/2022   KPAFRELGTCHN 1,263.0 (H) 03/03/2022   LAMBDASER 12.4 05/12/2022   LAMBDASER 6.2 03/03/2022   KAPLAMBRATIO 18.44 (H) 05/12/2022   KAPLAMBRATIO 20.45 (H) 03/31/2022   KAPLAMBRATIO 203.71 (H) 03/03/2022     RADIOGRAPHIC STUDIES: DG Chest 2 View  Result Date: 05/10/2022 CLINICAL DATA:  Palpitations.  Chemotherapy EXAM: CHEST - 2 VIEW  COMPARISON:  Chest x-ray dated 01/05/2022 FINDINGS: Heart size and mediastinal contours are stable. Lungs are clear. No pleural effusion or pneumothorax is seen. No acute-appearing osseous abnormality. IMPRESSION: No active cardiopulmonary disease. No evidence of pneumonia or pulmonary edema. Electronically Signed   By: Franki Cabot M.D.   On: 05/10/2022 13:27    ASSESSMENT & PLAN Sonya Franklin 57 y.o. female with medical history significant for newly diagnosed multiple myeloma with plasmacytoma who presents for a follow up visit.  # Multiple Myeloma with Plasmactyoma --Confirmed with L5 bone lesion biopsy and bone marrow biopsy. --Started VRD therapy on 05/05/2022.  PLAN: --Due for Cycle 1 Day 15 of VRD therapy today.  --Labs today reviewed and adequate for treatment. WBC 5.5, Hgb 11.6, Plt 200, Creatinine and Calcium levels normal.  --Last myeloma labs from 05/12/2022 showed significant improvement with kappa free light chain decreased from 1,263 to 228.6, ratio improved from 203.71 to 18.44 --Continue with weekly treatment and q 2 week office visits  #Jitteriness/Palpitations: --Likely secondary to steroid therapy versus abnormal thyroid function. --Dose reduced dexamethasone from 40 mg to 20 mg on Cycle 1 Day 8. --Discussed with Dr. Ilan Kahrs Limbo in Dr. Libby Maw absence who recommended further dose reduction of dexamethasone to 12 mg versus adding Ativan as premedication. Patient preferred dose reduction of dexamethasone which will start today, Cycle 1, Day 15.  -- Labs from 05/10/2022 showed TSH level 0.199 (L), normal free T4. Not on any thyroid medication. Sent referral to endocrinology to further evaluate.   #Right lower extremity neuropathy: --Greatly improved --Monitor for now.   #Cancer related pain: --Secondary to focal tumor replacement of the right-side of the L5 vertebral body extending into the right L5 pedicle and posterior elements with a small extraosseous component extending into  the right L5 neural foramen with moderate-severe stenosi -- Unable to tolerate opoid medications -- Sent lidocaine patches but did not pick up. Advised to try since she is hesitant to try opoid medications.  -- Made referral to our palliative care service. -- Discussed palliative radiation to help relieve pain if systemic therapy is not alleviating pain.  #Supportive Care -- chemotherapy education complted -- port placement not required.  -- zofran '8mg'$  q8H PRN and compazine '10mg'$  PO q6H for nausea -- acyclovir '400mg'$  PO BID for VCZ prophylaxis -- allopurinol '300mg'$  PO daily for TLS prophylaxis -- Pain medication as noted above  No orders of the defined types were placed in this encounter.   All questions were answered. The patient knows to call the clinic with any problems, questions or concerns.  A total of more than 30 minutes were spent on this encounter with face-to-face time and non-face-to-face time, including preparing to see the patient, ordering tests and/or medications, counseling the patient and coordination of care  as outlined above.   Dede Query PA-C Dept of Hematology and [place] at [facility] Phone: [phone]   05/19/2022 10:54 AM

## 2022-05-21 ENCOUNTER — Telehealth: Payer: Self-pay | Admitting: Internal Medicine

## 2022-05-21 ENCOUNTER — Ambulatory Visit: Payer: Medicare Other | Admitting: Physician Assistant

## 2022-05-21 ENCOUNTER — Encounter: Payer: Self-pay | Admitting: Physician Assistant

## 2022-05-21 ENCOUNTER — Encounter: Payer: Self-pay | Admitting: Hematology and Oncology

## 2022-05-21 ENCOUNTER — Encounter: Payer: Self-pay | Admitting: Nurse Practitioner

## 2022-05-21 ENCOUNTER — Other Ambulatory Visit: Payer: Self-pay | Admitting: *Deleted

## 2022-05-21 ENCOUNTER — Telehealth: Payer: Self-pay

## 2022-05-21 ENCOUNTER — Other Ambulatory Visit: Payer: Self-pay

## 2022-05-21 VITALS — BP 110/70 | HR 87 | Ht 61.0 in | Wt 171.0 lb

## 2022-05-21 DIAGNOSIS — C9000 Multiple myeloma not having achieved remission: Secondary | ICD-10-CM

## 2022-05-21 DIAGNOSIS — R7989 Other specified abnormal findings of blood chemistry: Secondary | ICD-10-CM

## 2022-05-21 DIAGNOSIS — R933 Abnormal findings on diagnostic imaging of other parts of digestive tract: Secondary | ICD-10-CM

## 2022-05-21 MED ORDER — LENALIDOMIDE 25 MG PO CAPS: 25.0000 mg | ORAL_CAPSULE | Freq: Every day | ORAL | 0 refills | Status: DC

## 2022-05-21 NOTE — Progress Notes (Signed)
Chief Complaint: Pancreatic lesion  HPI:    Sonya Franklin is a 57 year old African-American female with a past medical history as listed below including breast cancer, anxiety, ankylosing spondylitis and new diagnosis of multiple myeloma, known to Dr. Fuller Plan, who was referred to me by Elby Showers, MD for a complaint of pancreatic lesion.      01/12/2022 CT of the chest with contrast showed small hiatal hernia, cystic lesion in the body of the pancreas which had increased in size compared to 2020, follow-up MRI recommended and stable enlarged heterogenous right thyroid lobe.    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  That time recommended evaluation with EUS/FNA given the interval growth.  First follow-up pre and postcontrast MRI/MRCP in 6 months.  Also noted a lesion in the right hemisacrum concerning for osseous metastatic disease as well as mild diffuse hepatic steatosis.    04/15/2022 patient underwent CT bone marrow biopsy and aspiration.  This showed plasma cell neoplasm.  Since that time patient has been following with oncology for multiple myeloma.    05/19/2022 office visit with oncology and at that time continued on her chemotherapy.  They also set her up for possible palliative chemo.    Today, the patient presents to clinic and tells me she is not really sure why she is here.  She has gone through a lot over the past 5 months and is currently undergoing chemo for multiple myeloma which is not in remission.  She continues to follow with oncology in regards to this.  Denies any real GI symptoms, no real abdominal pain or other, occasionally has heartburn and reflux which she controls with daily Pantoprazole.  Does tell  me that she will have some diarrhea when she takes her chemo medication, but otherwise no complaints.    Last colonoscopy in 2014.    Denies fever, chills, vomiting or symptoms that awaken her from sleep.  Past Medical History:  Diagnosis Date   Ankylosing spondylitis ([place])    Anxiety    Arthritis    Breast cancer ([place])    right breast   Bursitis of hip, right    Cardiac arrhythmia    Carotid stenosis 08/26/2021   GE reflux    Hiatal hernia    Hypertension    Hyperthyroidism    Ischemic bowel disease ([place])    Multiple myeloma not having achieved remission ([place])    Prediabetes 08/26/2021   Snoring 03/21/2014   Vitamin D deficiency     Past Surgical History:  Procedure Laterality Date   ABDOMINAL HYSTERECTOMY     BREAST LUMPECTOMY     right   MASTECTOMY     double    Current Outpatient Medications  Medication Sig Dispense Refill   oxyCODONE (OXY IR/ROXICODONE) 5 MG immediate release tablet Take 1 tablet (5 mg total) by mouth every 4 (four) hours as needed for severe pain. (Patient not taking: Reported on 05/05/2022) 15 tablet 0   acyclovir (ZOVIRAX) 400 MG tablet Take 1 tablet (400 mg total) by mouth 2 (two) times daily. 60 tablet 3   ALPRAZolam (XANAX) 0.25 MG tablet TAKE 1 TABLET (0.25 MG TOTAL) BY MOUTH 2 (TWO) TIMES DAILY AS NEEDED FOR ANXIETY. (Patient not taking: Reported on 05/19/2022) 60 tablet 5   amLODipine (  NORVASC) 5 MG tablet TAKE 1 TABLET (5 MG TOTAL) BY MOUTH DAILY. 90 tablet 2   aspirin EC 81 MG tablet Take 81 mg by mouth daily. Swallow whole.     DULoxetine (CYMBALTA) 30 MG capsule Take 2 capsules (60 mg total) by mouth daily. Take 30 mg (1 tablet), if no side effects after 1 week, increase to 60 mg (2 tablets) thereafter (Patient not taking: Reported on 05/19/2022) 60 capsule 5   furosemide (LASIX) 20 MG tablet Take 1 tablet (20 mg total) by mouth daily as needed. 10 tablet 1   hyoscyamine (ANASPAZ) 0.125 MG TBDP disintergrating tablet Place 1 tablet (0.125 mg  total) under the tongue every 4 (four) hours as needed for cramping. 30 tablet 0   ibuprofen (ADVIL) 800 MG tablet TAKE 1 TABLET BY MOUTH EVERY 8 HOURS AS NEEDED FOR PAIN (Patient not taking: Reported on 05/05/2022) 180 tablet 3   lenalidomide (REVLIMID) 25 MG capsule Take 1 capsule (25 mg total) by mouth daily. Take for 14 days on, then 7 days off. Repeat every 21 days. 14 capsule 0   lidocaine (LIDODERM) 5 % Place 1 patch onto the skin daily. Remove & Discard patch within 12 hours or as directed by MD 30 patch 0   olopatadine (PATANOL) 0.1 % ophthalmic solution Place 1 drop into both eyes 2 (two) times daily as needed. 5 mL PRN   ondansetron (ZOFRAN) 4 MG tablet Take 1 tablet (4 mg total) by mouth every 8 (eight) hours as needed for nausea or vomiting. 20 tablet 0   pantoprazole (PROTONIX) 40 MG tablet TAKE 1 TABLET BY MOUTH EVERY DAY 90 tablet 3   saccharomyces boulardii (FLORASTOR) 250 MG capsule Take 1 capsule (250 mg total) by mouth 2 (two) times daily. (Patient not taking: Reported on 05/19/2022) 40 capsule 0   triamcinolone cream (KENALOG) 0.1 % Apply 1 Application topically 3 (three) times daily. 30 g 1   No current facility-administered medications for this visit.    Allergies as of 05/21/2022 - Review Complete 05/19/2022  Allergen Reaction Noted   Shellfish allergy Anaphylaxis 09/02/2010   Blue dyes (parenteral)  09/02/2010   Hydrochlorothiazide Itching 06/02/2021    Family History  Problem Relation Age of Onset   Diabetes Mother    Hypertension Mother    Hyperlipidemia Mother    High blood pressure Father    High Cholesterol Father    Colitis Brother    Goiter Maternal Grandmother    Breast cancer Maternal Aunt     Social History   Socioeconomic History   Marital status: Divorced    Spouse name: Not on file   Number of children: 1   Years of education: Not on file   Highest education level: Not on file  Occupational History    Employer: UNEMPLOYED  Tobacco Use    Smoking status: Never   Smokeless tobacco: Never  Vaping Use   Vaping Use: Never used  Substance and Sexual Activity   Alcohol use: No   Drug use: No   Sexual activity: Yes  Other Topics Concern   Not on file  Social History Narrative   Caffeine occasional drinker.   Not working/disabled.  12th grader. Divorced, one kids.       Social history: She receives disability benefits for history of breast cancer 2007 and ankylosing spondylitis.  She resides with special needs son.  She is divorced.  Non-smoker.  No alcohol consumption.       Family history: Father  with history of prostate cancer.  Patient's mother was diagnosed with breast cancer at age 64.  Maternal aunt with history of breast cancer.  3 brothers.   Right handed   Caffeine occas   One level stays on this level       Social Determinants of Health   Financial Resource Strain: Low Risk  (08/26/2021)   Overall Financial Resource Strain (CARDIA)    Difficulty of Paying Living Expenses: Not hard at all  Food Insecurity: No Food Insecurity (04/21/2022)   Hunger Vital Sign    Worried About Running Out of Food in the Last Year: Never true    Ran Out of Food in the Last Year: Never true  Transportation Needs: No Transportation Needs (08/26/2021)   PRAPARE - Hydrologist (Medical): No    Lack of Transportation (Non-Medical): No  Physical Activity: Inactive (08/26/2021)   Exercise Vital Sign    Days of Exercise per Week: 0 days    Minutes of Exercise per Session: 0 min  Stress: Not on file  Social Connections: Not on file  Intimate Partner Violence: Not At Risk (04/21/2022)   Humiliation, Afraid, Rape, and Kick questionnaire    Fear of Current or Ex-Partner: No    Emotionally Abused: No    Physically Abused: No    Sexually Abused: No    Review of Systems:    Constitutional: No fever or chills Skin: No rash Cardiovascular: No chest pain Respiratory: No SOB  Gastrointestinal: See HPI and otherwise  negative Genitourinary: No dysuria Neurological: No headache, dizziness or syncope Musculoskeletal: No new muscle or joint pain Hematologic: No bleeding  Psychiatric: No history of depression or anxiety   Physical Exam:  Vital signs: BP 110/70   Pulse 87   Ht '5\' 1"'$  (1.549 m)   Wt 171 lb (77.6 kg)   SpO2 97%   BMI 32.31 kg/m    Constitutional:   Pleasant AA female appears to be in NAD, Well developed, Well nourished, alert and cooperative Head:  Normocephalic and atraumatic. Eyes:   PEERL, EOMI. No icterus. Conjunctiva pink. Ears:  Normal auditory acuity. Neck:  Supple Throat: Oral cavity and pharynx without inflammation, swelling or lesion.  Respiratory: Respirations even and unlabored. Lungs clear to auscultation bilaterally.   No wheezes, crackles, or rhonchi.  Cardiovascular: Normal S1, S2. No MRG. Regular rate and rhythm. No peripheral edema, cyanosis or pallor.  Gastrointestinal:  Soft, nondistended, mild epigastric ttp. No rebound or guarding. Normal bowel sounds. No appreciable masses or hepatomegaly. Rectal:  Not performed.  Msk:  Symmetrical without gross deformities. Without edema, no deformity or joint abnormality.  Neurologic:  Alert and  oriented x4;  grossly normal neurologically.  Skin:   Dry and intact without significant lesions or rashes. Psychiatric: Demonstrates good judgement and reason without abnormal affect or behaviors.  RELEVANT LABS AND IMAGING: CBC    Component Value Date/Time   WBC 5.5 05/19/2022 1023   WBC 5.4 05/10/2022 1220   RBC 3.86 (L) 05/19/2022 1023   HGB 11.6 (L) 05/19/2022 1023   HGB 12.4 03/18/2011 1402   HCT 34.2 (L) 05/19/2022 1023   HCT 36.6 03/18/2011 1402   PLT 200 05/19/2022 1023   PLT 241 03/18/2011 1402   MCV 88.6 05/19/2022 1023   MCV 86.0 03/18/2011 1402   MCH 30.1 05/19/2022 1023   MCHC 33.9 05/19/2022 1023   RDW 13.5 05/19/2022 1023   RDW 13.2 03/18/2011 1402   LYMPHSABS 0.8 05/19/2022 1023  LYMPHSABS 1.5  03/18/2011 1402   MONOABS 1.0 05/19/2022 1023   MONOABS 0.5 03/18/2011 1402   EOSABS 0.1 05/19/2022 1023   EOSABS 0.1 03/18/2011 1402   BASOSABS 0.0 05/19/2022 1023   BASOSABS 0.0 03/18/2011 1402    CMP     Component Value Date/Time   NA 141 05/19/2022 1023   K 3.2 (L) 05/19/2022 1023   CL 106 05/19/2022 1023   CO2 26 05/19/2022 1023   GLUCOSE 99 05/19/2022 1023   BUN 15 05/19/2022 1023   CREATININE 0.86 05/19/2022 1023   CREATININE 0.63 10/30/2021 1238   CALCIUM 9.4 05/19/2022 1023   PROT 7.6 05/19/2022 1023   ALBUMIN 4.5 05/19/2022 1023   AST 11 (L) 05/19/2022 1023   ALT 19 05/19/2022 1023   ALKPHOS 80 05/19/2022 1023   BILITOT 0.6 05/19/2022 1023   GFRNONAA >60 05/19/2022 1023   GFRNONAA 108 04/05/2020 1128   GFRAA 126 04/05/2020 1128    Assessment: 1.  Abnormal imaging of the pancreas: Increasing size of cystic lesion 2.  New diagnosis of multiple myeloma: Currently undergoing chemotherapy 3.  Screening for colorectal cancer: It has been 10 years since patient's last colonoscopy  Plan: 1.  When I discuss further possible testing with the patient she tells me that it is too overwhelming.  She is going through a lot right now with all of the treatment for her multiple myeloma and feels overwhelmed.  I told her that I would reach out to her oncology team and see if they want [place] involved or if our diagnostic testing would help them at all in her treatment.  I will let her know if they would like [place] to pursue something and she can make further decisions then. 2.  Patient to follow in clinic per recommendations.  Ellouise Newer, PA-C [facility] 05/21/2022, 9:13 AM  Cc: Elby Showers, MD

## 2022-05-21 NOTE — Telephone Encounter (Signed)
Notified Patient of prior authorization approval for Lidocaine 5% Patches. Medication is approved through 03/16/2023. No other needs or concerns voiced at this time.

## 2022-05-21 NOTE — Patient Instructions (Signed)
   _______________________________________________________  If your blood pressure at your visit was 140/90 or greater, please contact your primary care physician to follow up on this.  _______________________________________________________  If you are age 57 or older, your body mass index should be between 23-30. Your Body mass index is 32.31 kg/m. If this is out of the aforementioned range listed, please consider follow up with your Primary Care Provider.  If you are age 83 or younger, your body mass index should be between 19-25. Your Body mass index is 32.31 kg/m. If this is out of the aformentioned range listed, please consider follow up with your Primary Care Provider.   ________________________________________________________  The [facility] providers would like to encourage you to use [facility] to communicate with providers for non-urgent requests or questions.  Due to long hold times on the telephone, sending your provider a message by [facility] may be a faster and more efficient way to get a response.  Please allow 48 business hours for a response.  Please remember that this is for non-urgent requests.  _______________________________________________________ It was a pleasure to see you today!  Thank you for trusting me with your gastrointestinal care!

## 2022-05-21 NOTE — Telephone Encounter (Signed)
Sonya Franklin [id number]  [place] called today about getting a referral to endocrinology about her TSH being low at a recent ED visit. She called them she had seen DR Dwyane Dee in the past and they said she need a referral. When I looked at the notes it looked like to me that maybe the oncology was going to address it.

## 2022-05-21 NOTE — Telephone Encounter (Signed)
Scheduled appointment

## 2022-05-25 ENCOUNTER — Ambulatory Visit (INDEPENDENT_AMBULATORY_CARE_PROVIDER_SITE_OTHER): Payer: Medicare Other | Admitting: Internal Medicine

## 2022-05-25 ENCOUNTER — Encounter: Payer: Self-pay | Admitting: Internal Medicine

## 2022-05-25 ENCOUNTER — Ambulatory Visit (INDEPENDENT_AMBULATORY_CARE_PROVIDER_SITE_OTHER): Payer: Medicare Other

## 2022-05-25 ENCOUNTER — Telehealth: Payer: Self-pay

## 2022-05-25 VITALS — BP 122/74 | HR 77 | Temp 97.6°F | Ht 61.0 in | Wt 176.0 lb

## 2022-05-25 DIAGNOSIS — I10 Essential (primary) hypertension: Secondary | ICD-10-CM | POA: Diagnosis not present

## 2022-05-25 DIAGNOSIS — Z853 Personal history of malignant neoplasm of breast: Secondary | ICD-10-CM | POA: Diagnosis not present

## 2022-05-25 DIAGNOSIS — Z8739 Personal history of other diseases of the musculoskeletal system and connective tissue: Secondary | ICD-10-CM | POA: Diagnosis not present

## 2022-05-25 DIAGNOSIS — Z8639 Personal history of other endocrine, nutritional and metabolic disease: Secondary | ICD-10-CM | POA: Diagnosis not present

## 2022-05-25 DIAGNOSIS — R7302 Impaired glucose tolerance (oral): Secondary | ICD-10-CM

## 2022-05-25 DIAGNOSIS — Z0000 Encounter for general adult medical examination without abnormal findings: Secondary | ICD-10-CM | POA: Diagnosis not present

## 2022-05-25 DIAGNOSIS — J0100 Acute maxillary sinusitis, unspecified: Secondary | ICD-10-CM

## 2022-05-25 DIAGNOSIS — R7989 Other specified abnormal findings of blood chemistry: Secondary | ICD-10-CM

## 2022-05-25 DIAGNOSIS — E039 Hypothyroidism, unspecified: Secondary | ICD-10-CM | POA: Diagnosis not present

## 2022-05-25 MED ORDER — METOPROLOL TARTRATE 25 MG PO TABS: ORAL_TABLET | ORAL | 0 refills | Status: DC

## 2022-05-25 NOTE — Progress Notes (Signed)
Subjective:   Sonya Franklin is a 57 y.o. female who presents for Medicare Annual (Subsequent) preventive examination.  Review of Systems     Cardiac Risk Factors include: advanced age (>11mn, >>87women)     Objective:    There were no vitals filed for this visit. There is no height or weight on file to calculate BMI.     05/25/2022   11:19 AM 05/10/2022    3:35 PM 04/21/2022    2:02 PM 04/15/2022    7:02 AM 01/08/2022    2:44 PM 04/10/2021    3:15 PM 07/17/2016    4:03 PM  Advanced Directives  Does Patient Have a Medical Advance Directive? No No No No No No No  Would patient like information on creating a medical advance directive?   No - Patient declined No - Patient declined  No - Patient declined No - Patient declined    Current Medications (verified) Outpatient Encounter Medications as of 05/25/2022  Medication Sig   acyclovir (ZOVIRAX) 400 MG tablet Take 1 tablet (400 mg total) by mouth 2 (two) times daily.   amLODipine (NORVASC) 5 MG tablet TAKE 1 TABLET (5 MG TOTAL) BY MOUTH DAILY.   aspirin EC 81 MG tablet Take 81 mg by mouth daily. Swallow whole.   furosemide (LASIX) 20 MG tablet Take 1 tablet (20 mg total) by mouth daily as needed.   hyoscyamine (ANASPAZ) 0.125 MG TBDP disintergrating tablet Place 1 tablet (0.125 mg total) under the tongue every 4 (four) hours as needed for cramping.   lenalidomide (REVLIMID) 25 MG capsule Take 1 capsule (25 mg total) by mouth daily. Take for 14 days on, then 7 days off. Repeat every 21 days.   lidocaine (LIDODERM) 5 % Place 1 patch onto the skin daily. Remove & Discard patch within 12 hours or as directed by MD   metoprolol tartrate (LOPRESSOR) 25 MG tablet One half  tab up to twice daily if needed for palpitations   olopatadine (PATANOL) 0.1 % ophthalmic solution Place 1 drop into both eyes 2 (two) times daily as needed.   pantoprazole (PROTONIX) 40 MG tablet TAKE 1 TABLET BY MOUTH EVERY DAY   triamcinolone cream (KENALOG) 0.1 % Apply  1 Application topically 3 (three) times daily.   No facility-administered encounter medications on file as of 05/25/2022.    Allergies (verified) Shellfish allergy, Blue dyes (parenteral), and Hydrochlorothiazide   History: Past Medical History:  Diagnosis Date   Ankylosing spondylitis (HCC)    Anxiety    Arthritis    [place] ([place]    right breast   Bursitis of hip, right    Cardiac arrhythmia    Carotid stenosis 08/26/2021   GE reflux    Hiatal hernia    Hypertension    Hyperthyroidism    Ischemic bowel disease ([place]    Multiple myeloma not having achieved remission ([place]    Prediabetes 08/26/2021   Snoring 03/21/2014   Vitamin D deficiency    Past Surgical History:  Procedure Laterality Date   ABDOMINAL HYSTERECTOMY     BREAST LUMPECTOMY     right   MASTECTOMY     double   Family History  Problem Relation Age of Onset   Diabetes Mother    Hypertension Mother    Hyperlipidemia Mother    High blood pressure Father    High Cholesterol Father    Colitis Brother    Goiter Maternal Grandmother    Breast cancer Maternal Aunt  Social History   Socioeconomic History   Marital status: Divorced    Spouse name: Not on file   Number of children: 1   Years of education: Not on file   Highest education level: Not on file  Occupational History    Employer: UNEMPLOYED  Tobacco Use   Smoking status: Never   Smokeless tobacco: Never  Vaping Use   Vaping Use: Never used  Substance and Sexual Activity   Alcohol use: No   Drug use: No   Sexual activity: Yes  Other Topics Concern   Not on file  Social History Narrative   Caffeine occasional drinker.   Not working/disabled.  12th grader. Divorced, one kids.       Social history: She receives disability benefits for history of breast cancer 2007 and ankylosing spondylitis.  She resides with special needs son.  She is divorced.  Non-smoker.  No alcohol consumption.       Family history: Father with history of  prostate cancer.  Patient's mother was diagnosed with breast cancer at age 46.  Maternal aunt with history of breast cancer.  3 brothers.   Right handed   Caffeine occas   One level stays on this level       Social Determinants of Health   Financial Resource Strain: Low Risk  (08/26/2021)   Overall Financial Resource Strain (CARDIA)    Difficulty of Paying Living Expenses: Not hard at all  Food Insecurity: No Food Insecurity (05/25/2022)   Hunger Vital Sign    Worried About Running Out of Food in the Last Year: Never true    Ran Out of Food in the Last Year: Never true  Transportation Needs: No Transportation Needs (08/26/2021)   PRAPARE - Hydrologist (Medical): No    Lack of Transportation (Non-Medical): No  Physical Activity: Inactive (08/26/2021)   Exercise Vital Sign    Days of Exercise per Week: 0 days    Minutes of Exercise per Session: 0 min  Stress: Not on file  Social Connections: Not on file    Tobacco Counseling Counseling given: Not Answered   Clinical Intake:                 Diabetic? No         Activities of Daily Living    05/25/2022   11:19 AM  In your present state of health, do you have any difficulty performing the following activities:  Hearing? 0  Vision? 0  Difficulty concentrating or making decisions? 1  Walking or climbing stairs? 1  Doing errands, shopping? 1  Preparing Food and eating ? N  Using the Toilet? N  In the past six months, have you accidently leaked urine? N  Do you have problems with loss of bowel control? N  Managing your Medications? N  Managing your Finances? N  Housekeeping or managing your Housekeeping? N    Patient Care Team: Elby Showers, MD as PCP - General (Internal Medicine)  Indicate any recent Medical Services you may have received from other than [facility] providers in the past year (date may be approximate).     Assessment:   This is a routine wellness examination for  Sonya Franklin.  Hearing/Vision screen No results found.  Dietary issues and exercise activities discussed: Current Exercise Habits: The patient does not participate in regular exercise at present, Exercise limited by: Other - see comments   Goals Addressed   None   Depression Screen  05/25/2022   10:40 AM 04/21/2022    2:04 PM 02/25/2022   11:53 AM 01/16/2022    3:25 PM 01/05/2022    3:06 PM 12/24/2021   10:55 AM 04/10/2021    3:16 PM  PHQ 2/9 Scores  PHQ - 2 Score 0 0 0 0 0 0 0  Exception Documentation  Patient refusal         Fall Risk    05/25/2022   10:40 AM 02/25/2022   11:53 AM 01/16/2022    3:25 PM 01/08/2022    2:44 PM 01/05/2022    3:06 PM  Fall Risk   Falls in the past year? 0 0 0 0 0  Number falls in past yr: 0 0 0 0 0  Injury with Fall? 0 0 0 0 0  Risk for fall due to : No Fall Risks No Fall Risks No Fall Risks  No Fall Risks  Follow up Falls prevention discussed Falls prevention discussed Falls evaluation completed  Falls evaluation completed    Mark:  Any stairs in or around the home?  If so, are there any without handrails?  Home free of loose throw rugs in walkways, pet beds, electrical cords, etc?  Adequate lighting in your home to reduce risk of falls?   ASSISTIVE DEVICES UTILIZED TO PREVENT FALLS:  Life alert?  Use of a cane, walker or w/c?  Grab bars in the bathroom?  Shower chair or bench in shower?  Elevated toilet seat or a handicapped toilet?   TIMED UP AND GO:  Was the test performed?  Length of time to ambulate 10 feet: sec.     Cognitive Function:        05/25/2022   11:20 AM 04/10/2021    3:17 PM  6CIT Screen  What Year? 0 points 0 points  What month? 0 points 0 points  What time? 0 points 0 points  Count back from 20 0 points 0 points  Months in reverse 0 points 0 points  Repeat phrase 0 points 0 points  Total Score 0 points 0 points    Immunizations Immunization History   Administered Date(s) Administered   PFIZER(Purple Top)SARS-COV-2 Vaccination 06/15/2019, 07/10/2019   Tdap 01/22/2020    TDAP status: Up to date  Flu Vaccine status: Due, Education has been provided regarding the importance of this vaccine. Advised may receive this vaccine at local pharmacy or Health Dept. Aware to provide a copy of the vaccination record if obtained from local pharmacy or Health Dept. Verbalized acceptance and understanding.  Pneumococcal vaccine status: Due, Education has been provided regarding the importance of this vaccine. Advised may receive this vaccine at local pharmacy or Health Dept. Aware to provide a copy of the vaccination record if obtained from local pharmacy or Health Dept. Verbalized acceptance and understanding.  Covid-19 vaccine status: Information provided on how to obtain vaccines.   Qualifies for Shingles Vaccine? Yes   Zostavax completed No   Shingrix Completed?: No.    Education has been provided regarding the importance of this vaccine. Patient has been advised to call insurance company to determine out of pocket expense if they have not yet received this vaccine. Advised may also receive vaccine at local pharmacy or Health Dept. Verbalized acceptance and understanding.  Screening Tests Health Maintenance  Topic Date Due   FOOT EXAM  Never done   Diabetic kidney evaluation - Urine ACR  04/08/2021   COLONOSCOPY (Pts 45-81yr Insurance coverage will need  to be confirmed)  03/21/2022   INFLUENZA VACCINE  06/14/2022 (Originally 10/14/2021)   OPHTHALMOLOGY EXAM  08/27/2022 (Originally 12/16/2021)   COVID-19 Vaccine (3 - Pfizer risk series) 08/27/2022 (Originally 08/07/2019)   Zoster Vaccines- Shingrix (1 of 2) 11/25/2022 (Originally 05/14/1984)   HEMOGLOBIN A1C  07/10/2022   Diabetic kidney evaluation - eGFR measurement  05/19/2023   DTaP/Tdap/Td (2 - Td or Tdap) 01/21/2030   HIV Screening  Completed   HPV VACCINES  Aged Out   MAMMOGRAM  Discontinued    PAP SMEAR-Modifier  Discontinued   Hepatitis C Screening  Discontinued    Health Maintenance  Health Maintenance Due  Topic Date Due   FOOT EXAM  Never done   Diabetic kidney evaluation - Urine ACR  04/08/2021   COLONOSCOPY (Pts 45-38yr Insurance coverage will need to be confirmed)  03/21/2022    Colorectal cancer screening: Type of screening: Colonoscopy. Completed 03/21/12. Repeat every 10 years  Mammogram status: No longer required due to 08/13/05.    Lung Cancer Screening: (Low Dose CT Chest recommended if Age 57-80years, 30 pack-year currently smoking OR have quit w/in 15years.) does not qualify.   Lung Cancer Screening Referral:   Additional Screening:  Hepatitis C Screening: does not qualify; Completed   Vision Screening: Recommended annual ophthalmology exams for early detection of glaucoma and other disorders of the eye. Is the patient up to date with their annual eye exam?   Who is the provider or what is the name of the office in which the patient attends annual eye exams? TBD If pt is not established with a provider, would they like to be referred to a provider to establish care?   Dental Screening: Recommended annual dental exams for proper oral hygiene  Community Resource Referral / Chronic Care Management: CRR required this visit?  No   CCM required this visit?  No      Plan:     I have personally reviewed and noted the following in the patient's chart:   Medical and social history Use of alcohol, tobacco or illicit drugs  Current medications and supplements including opioid prescriptions. Patient is not currently taking opioid prescriptions. Functional ability and status Nutritional status Physical activity Advanced directives List of other physicians Hospitalizations, surgeries, and ER visits in previous 12 months Vitals Screenings to include cognitive, depression, and falls Referrals and appointments  In addition, I have reviewed and  discussed with patient certain preventive protocols, quality metrics, and best practice recommendations. A written personalized care plan for preventive services as well as general preventive health recommendations were provided to patient.     [place]earlean Brownie [place]  05/25/2022   Nurse Notes:

## 2022-05-25 NOTE — Telephone Encounter (Signed)
error 

## 2022-05-25 NOTE — Progress Notes (Signed)
   Subjective:    Patient ID: Sonya Franklin, female    DOB: 12/15/1965, 57 y.o.   MRN: [id number]  HPI  Patient has low TSH.  TSH was drawn Feb 25 and was low at 0.199.  TSH had been 0.57 in January 2023. Has not been taking extra levothyroxine.     Longstanding history of hypothyroidism. She saw Dr. Dwyane Dee in 2020 and had normal free T4 and T3 but TSH was low at 0.03. Hx of multinodular goiter and has not seen him since that time.  She has a remote history of breast cancer that has been in remission for many years.  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  Dr. Justine Null treated her in the remote past for ankylosing spondylitis.  We give her Depo-Medrol injections from time to time when she has musculoskeletal pain.  She is not on thyroid replacement medication.  It is not clear to me why TSH is low at 0.01.  She has chronic fatigue and chronic musculoskeletal pain.  I do not think she has hyperthyroidism.  However, Her TSH levels have been low normal for some years now. 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  I have suggested to her that it would be best if she sees Endocrinologist about this.  In January 2023 her TSH was 0.57 i.e. low normal.  It was not checked again until February 2024 and was found to be 0.199.  It was repeated again in May 25, 2022 and was 0.01.  She has undergone no radiation treatments in the recent past.  Dr. Lorenso Courier has her on Revlimid 25 mg daily for 14 days then 7 days off.  It is to be repeated every 21 days.    Review of Systems always fatigues with generalized myalgias for years     Objective:   Physical Exam  BP 122/74, pulse 77, T 97.6, Weight 176 lbs, BMI 33.25  No cervical adenopathy or thyromegaly appreciated.  She is alert and oriented    Assessment & Plan:  Low TSH-etiology unclear  Recently diagnosed with multiple myeloma and is on Revlimid(lenalidomide) per Dr. Lorenso Courier  Longstanding hx of musculoskeletal pain /  anklylosing spondylitis per Dr. Justine Null years ago vs fibromyalgia syndrome responds well to intermittent Depomedrol injections  Remote hx of breast cancer  Mild HTN  GERD treated with Protonix  Anxiety over recent dx multiple myeloma  Plan: Endocrinology consultation

## 2022-05-26 LAB — MICROALBUMIN / CREATININE URINE RATIO
Creatinine, Urine: 215 mg/dL (ref 20–275)
Microalb Creat Ratio: 7 mcg/mg creat (ref <30)
Microalb, Ur: 1.4 mg/dL

## 2022-05-26 LAB — TSH: TSH: 0.01 mIU/L — ABNORMAL LOW (ref 0.40–4.50)

## 2022-05-26 NOTE — Progress Notes (Signed)
LVM for patient to Call Dr Ronnie Derby office for an appointment

## 2022-05-27 ENCOUNTER — Inpatient Hospital Stay: Payer: Medicare Other

## 2022-05-27 ENCOUNTER — Other Ambulatory Visit: Payer: Self-pay

## 2022-05-27 ENCOUNTER — Inpatient Hospital Stay (HOSPITAL_BASED_OUTPATIENT_CLINIC_OR_DEPARTMENT_OTHER): Payer: Medicare Other | Admitting: Hematology and Oncology

## 2022-05-27 DIAGNOSIS — E559 Vitamin D deficiency, unspecified: Secondary | ICD-10-CM | POA: Diagnosis not present

## 2022-05-27 DIAGNOSIS — Z803 Family history of malignant neoplasm of breast: Secondary | ICD-10-CM | POA: Diagnosis not present

## 2022-05-27 DIAGNOSIS — Z7961 Long term (current) use of immunomodulator: Secondary | ICD-10-CM | POA: Diagnosis not present

## 2022-05-27 DIAGNOSIS — Z9013 Acquired absence of bilateral breasts and nipples: Secondary | ICD-10-CM | POA: Diagnosis not present

## 2022-05-27 DIAGNOSIS — K219 Gastro-esophageal reflux disease without esophagitis: Secondary | ICD-10-CM | POA: Diagnosis not present

## 2022-05-27 DIAGNOSIS — R002 Palpitations: Secondary | ICD-10-CM | POA: Diagnosis not present

## 2022-05-27 DIAGNOSIS — M7989 Other specified soft tissue disorders: Secondary | ICD-10-CM | POA: Diagnosis not present

## 2022-05-27 DIAGNOSIS — C9000 Multiple myeloma not having achieved remission: Secondary | ICD-10-CM

## 2022-05-27 DIAGNOSIS — M459 Ankylosing spondylitis of unspecified sites in spine: Secondary | ICD-10-CM | POA: Diagnosis not present

## 2022-05-27 DIAGNOSIS — I10 Essential (primary) hypertension: Secondary | ICD-10-CM | POA: Diagnosis not present

## 2022-05-27 DIAGNOSIS — Z79899 Other long term (current) drug therapy: Secondary | ICD-10-CM | POA: Diagnosis not present

## 2022-05-27 DIAGNOSIS — Z5112 Encounter for antineoplastic immunotherapy: Secondary | ICD-10-CM | POA: Diagnosis not present

## 2022-05-27 DIAGNOSIS — Z7982 Long term (current) use of aspirin: Secondary | ICD-10-CM | POA: Diagnosis not present

## 2022-05-27 DIAGNOSIS — M48061 Spinal stenosis, lumbar region without neurogenic claudication: Secondary | ICD-10-CM | POA: Diagnosis not present

## 2022-05-27 DIAGNOSIS — G893 Neoplasm related pain (acute) (chronic): Secondary | ICD-10-CM | POA: Diagnosis not present

## 2022-05-27 LAB — CBC WITH DIFFERENTIAL (CANCER CENTER ONLY)
Abs Immature Granulocytes: 0.02 10*3/uL (ref 0.00–0.07)
Basophils Absolute: 0.1 10*3/uL (ref 0.0–0.1)
Basophils Relative: 1 %
Eosinophils Absolute: 0.1 10*3/uL (ref 0.0–0.5)
Eosinophils Relative: 3 %
HCT: 29.1 % — ABNORMAL LOW (ref 36.0–46.0)
Hemoglobin: 9.8 g/dL — ABNORMAL LOW (ref 12.0–15.0)
Immature Granulocytes: 0 %
Lymphocytes Relative: 21 %
Lymphs Abs: 1 10*3/uL (ref 0.7–4.0)
MCH: 30.1 pg (ref 26.0–34.0)
MCHC: 33.7 g/dL (ref 30.0–36.0)
MCV: 89.3 fL (ref 80.0–100.0)
Monocytes Absolute: 1.1 10*3/uL — ABNORMAL HIGH (ref 0.1–1.0)
Monocytes Relative: 22 %
Neutro Abs: 2.5 10*3/uL (ref 1.7–7.7)
Neutrophils Relative %: 53 %
Platelet Count: 247 10*3/uL (ref 150–400)
RBC: 3.26 MIL/uL — ABNORMAL LOW (ref 3.87–5.11)
RDW: 13.8 % (ref 11.5–15.5)
WBC Count: 4.8 10*3/uL (ref 4.0–10.5)
nRBC: 0 % (ref 0.0–0.2)

## 2022-05-27 LAB — CMP (CANCER CENTER ONLY)
ALT: 20 U/L (ref 0–44)
AST: 15 U/L (ref 15–41)
Albumin: 4 g/dL (ref 3.5–5.0)
Alkaline Phosphatase: 86 U/L (ref 38–126)
Anion gap: 6 (ref 5–15)
BUN: 9 mg/dL (ref 6–20)
CO2: 28 mmol/L (ref 22–32)
Calcium: 9 mg/dL (ref 8.9–10.3)
Chloride: 109 mmol/L (ref 98–111)
Creatinine: 0.57 mg/dL (ref 0.44–1.00)
GFR, Estimated: >60 mL/min (ref >60)
Glucose, Bld: 84 mg/dL (ref 70–99)
Potassium: 3.3 mmol/L — ABNORMAL LOW (ref 3.5–5.1)
Sodium: 143 mmol/L (ref 135–145)
Total Bilirubin: 0.4 mg/dL (ref 0.3–1.2)
Total Protein: 6.7 g/dL (ref 6.5–8.1)

## 2022-05-27 MED ORDER — SODIUM CHLORIDE 0.9 % IV SOLN: INTRAVENOUS | Status: AC

## 2022-05-27 MED ORDER — DEXAMETHASONE 4 MG PO TABS
12.0000 mg | ORAL_TABLET | Freq: Once | ORAL | Status: AC
  Administered 2022-05-27: 12 mg via ORAL
  Filled 2022-05-27: qty 3

## 2022-05-27 MED ORDER — BORTEZOMIB CHEMO SQ INJECTION 3.5 MG (2.5MG/ML)
1.3000 mg/m2 | Freq: Once | INTRAMUSCULAR | Status: AC
  Administered 2022-05-27: 2.5 mg via SUBCUTANEOUS
  Filled 2022-05-27: qty 1

## 2022-05-27 MED ORDER — POTASSIUM CHLORIDE CRYS ER 20 MEQ PO TBCR: 40.0000 meq | EXTENDED_RELEASE_TABLET | Freq: Every day | ORAL | 0 refills | Status: DC

## 2022-05-27 NOTE — Patient Instructions (Signed)
[facility]  Discharge Instructions: Thank you for choosing [facility] to provide your oncology and hematology care.   If you have a lab appointment with the Cancer Center, please go directly to the Cancer Center and check in at the registration area.   Wear comfortable clothing and clothing appropriate for easy access to any Portacath or PICC line.   We strive to give you quality time with your provider. You may need to reschedule your appointment if you arrive late (15 or more minutes).  Arriving late affects you and other patients whose appointments are after yours.  Also, if you miss three or more appointments without notifying the office, you may be dismissed from the clinic at the provider's discretion.      For prescription refill requests, have your pharmacy contact our office and allow 72 hours for refills to be completed.    Today you received the following chemotherapy and/or immunotherapy agents velcade      To help prevent nausea and vomiting after your treatment, we encourage you to take your nausea medication as directed.  BELOW ARE SYMPTOMS THAT SHOULD BE REPORTED IMMEDIATELY: *FEVER GREATER THAN 100.4 F (38 C) OR HIGHER *CHILLS OR SWEATING *NAUSEA AND VOMITING THAT IS NOT CONTROLLED WITH YOUR NAUSEA MEDICATION *UNUSUAL SHORTNESS OF BREATH *UNUSUAL BRUISING OR BLEEDING *URINARY PROBLEMS (pain or burning when urinating, or frequent urination) *BOWEL PROBLEMS (unusual diarrhea, constipation, pain near the anus) TENDERNESS IN MOUTH AND THROAT WITH OR WITHOUT PRESENCE OF ULCERS (sore throat, sores in mouth, or a toothache) UNUSUAL RASH, SWELLING OR PAIN  UNUSUAL VAGINAL DISCHARGE OR ITCHING   Items with * indicate a potential emergency and should be followed up as soon as possible or go to the Emergency Department if any problems should occur.  Please show the CHEMOTHERAPY ALERT CARD or IMMUNOTHERAPY ALERT CARD at check-in  to the Emergency Department and triage nurse.  Should you have questions after your visit or need to cancel or reschedule your appointment, please contact [facility]  Dept: [phone]  and follow the prompts.  Office hours are 8:00 a.m. to 4:30 p.m. Monday - Friday. Please note that voicemails left after 4:00 p.m. may not be returned until the following business day.  We are closed weekends and major holidays. You have access to a nurse at all times for urgent questions. Please call the main number to the clinic Dept: [phone] and follow the prompts.   For any non-urgent questions, you may also contact your provider using MyChart. We now offer e-Visits for anyone 18 and older to request care online for non-urgent symptoms. For details visit [email or web address].   Also download the MyChart app! Go to the app store, search "MyChart", open the app, select , and log in with your MyChart username and password.   

## 2022-05-27 NOTE — Progress Notes (Signed)
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  05/05/2022: Cycle 1 Day 1 of VRD therapy 05/27/2022: Cycle 2 Day 1 of VRD therapy  Interval History:  Sonya Franklin 57 y.o. female with medical history significant for recently diagnosed multiple myeloma with plasmacytoma who presents for a follow up visit. The patient's last visit was on 05/19/2022. She presents today to start Cycle 2, Day 1 of VRD therapy.   On exam today [name] reports she has been feeling jittery and thinks this may be related more to her thyroid medication than to her steroids.  She reports that her steroids were dropped down to 10 mg and she did not notice much of a difference.  She reports that she has been having some occasional bruising and chemo brain but otherwise has been quite well.  She also has had some minor change in her taste.  She reports overall that she is eating well but thinks that the taste differences may be more from COVID than from her chemotherapy treatment.  She notes that she is not having any trouble with nausea, vomiting, or diarrhea.  She remains concerned about the lesion in her pancreas and wants to know why we cannot "drain it". She denies fevers, chills, sweats, shortness of breath, chest pain or cough. She has no other complaints. Rest of the 10 point ROS is below.   MEDICAL HISTORY:  Past Medical History:  Diagnosis Date   Ankylosing spondylitis (HCC)    Anxiety    Arthritis    Breast cancer (HCC)    right breast   Bursitis of hip, right    Cardiac arrhythmia    Carotid stenosis 08/26/2021   GE reflux    Hiatal  hernia    Hypertension    Hyperthyroidism    Ischemic bowel disease (HCC)    Multiple myeloma not having achieved remission (HCC)    Prediabetes 08/26/2021   Snoring 03/21/2014   Vitamin D deficiency     SURGICAL HISTORY: Past Surgical History:  Procedure Laterality Date   ABDOMINAL HYSTERECTOMY     BREAST LUMPECTOMY     right   MASTECTOMY     double    SOCIAL HISTORY: Social History   Socioeconomic History   Marital status: Divorced    Spouse name: Not on file   Number of children: 1   Years of education: Not on file   Highest education level: Not on file  Occupational History    Employer: UNEMPLOYED  Tobacco Use   Smoking status: Never   Smokeless tobacco: Never  Vaping Use   Vaping Use: Never used  Substance and Sexual Activity   Alcohol use: No   Drug use: No   Sexual activity: Yes  Other Topics Concern   Not on file  Social History Narrative   Caffeine occasional drinker.   Not working/disabled.  12th grader. Divorced, one kids.       Social history: She receives disability benefits for history of breast cancer 2007 and ankylosing spondylitis.  She resides with special needs son.  She is divorced.  Non-smoker.  No alcohol consumption.       Family history: Father with  history of prostate cancer.  Patient's mother was diagnosed with breast cancer at age 34.  Maternal aunt with history of breast cancer.  3 brothers.   Right handed   Caffeine occas   One level stays on this level       Social Determinants of Health   Financial Resource Strain: Low Risk  (08/26/2021)   Overall Financial Resource Strain (CARDIA)    Difficulty of Paying Living Expenses: Not hard at all  Food Insecurity: No Food Insecurity (05/25/2022)   Hunger Vital Sign    Worried About Running Out of Food in the Last Year: Never true    Ran Out of Food in the Last Year: Never true  Transportation Needs: No Transportation Needs (08/26/2021)   PRAPARE - Radiographer, therapeutic (Medical): No    Lack of Transportation (Non-Medical): No  Physical Activity: Inactive (08/26/2021)   Exercise Vital Sign    Days of Exercise per Week: 0 days    Minutes of Exercise per Session: 0 min  Stress: Not on file  Social Connections: Not on file  Intimate Partner Violence: Not At Risk (04/21/2022)   Humiliation, Afraid, Rape, and Kick questionnaire    Fear of Current or Ex-Partner: No    Emotionally Abused: No    Physically Abused: No    Sexually Abused: No    FAMILY HISTORY: Family History  Problem Relation Age of Onset   Diabetes Mother    Hypertension Mother    Hyperlipidemia Mother    High blood pressure Father    High Cholesterol Father    Colitis Brother    Goiter Maternal Grandmother    Breast cancer Maternal Aunt     ALLERGIES:  is allergic to shellfish allergy, blue dyes (parenteral), and hydrochlorothiazide.  MEDICATIONS:  Current Outpatient Medications  Medication Sig Dispense Refill   potassium chloride SA (KLOR-CON M) 20 MEQ tablet Take 2 tablets (40 mEq total) by mouth daily. 60 tablet 0   acyclovir (ZOVIRAX) 400 MG tablet Take 1 tablet (400 mg total) by mouth 2 (two) times daily. 60 tablet 3   amLODipine (NORVASC) 5 MG tablet TAKE 1 TABLET (5 MG TOTAL) BY MOUTH DAILY. 90 tablet 2   aspirin EC 81 MG tablet Take 81 mg by mouth daily. Swallow whole.     furosemide (LASIX) 20 MG tablet Take 1 tablet (20 mg total) by mouth daily as needed. 10 tablet 1   hyoscyamine (ANASPAZ) 0.125 MG TBDP disintergrating tablet Place 1 tablet (0.125 mg total) under the tongue every 4 (four) hours as needed for cramping. 30 tablet 0   lenalidomide (REVLIMID) 25 MG capsule Take 1 capsule (25 mg total) by mouth daily. Take for 14 days on, then 7 days off. Repeat every 21 days. 14 capsule 0   lidocaine (LIDODERM) 5 % Place 1 patch onto the skin daily. Remove & Discard patch within 12 hours or as directed by MD 30 patch 0   metoprolol tartrate (LOPRESSOR) 25 MG  tablet One half  tab up to twice daily if needed for palpitations 30 tablet 0   olopatadine (PATANOL) 0.1 % ophthalmic solution Place 1 drop into both eyes 2 (two) times daily as needed. 5 mL PRN   pantoprazole (PROTONIX) 40 MG tablet TAKE 1 TABLET BY MOUTH EVERY DAY 90 tablet 3   traMADol (ULTRAM) 50 MG tablet Take 1 tablet (50 mg total) by mouth every 8 (eight) hours as needed for moderate pain. 30 tablet 0  triamcinolone cream (KENALOG) 0.1 % Apply 1 Application topically 3 (three) times daily. 30 g 1   No current facility-administered medications for this visit.    REVIEW OF SYSTEMS:   Constitutional: ( - ) fevers, ( - )  chills , ( - ) night sweats Eyes: ( - ) blurriness of vision, ( - ) double vision, ( - ) watery eyes Ears, nose, mouth, throat, and face: ( - ) mucositis, ( - ) sore throat Respiratory: ( - ) cough, ( - ) dyspnea, ( - ) wheezes Cardiovascular: ( - ) palpitation, ( - ) chest discomfort, ( - ) lower extremity swelling Gastrointestinal:  ( - ) nausea, ( - ) heartburn, ( - ) change in bowel habits Skin: ( - ) abnormal skin rashes Lymphatics: ( - ) new lymphadenopathy, ( - ) easy bruising Neurological: ( - ) numbness, ( - ) tingling, ( - ) new weaknesses Behavioral/Psych: ( - ) mood change, ( - ) new changes  All other systems were reviewed with the patient and are negative.  PHYSICAL EXAMINATION: ECOG PERFORMANCE STATUS: 1 - Symptomatic but completely ambulatory  Vitals:   05/27/22 1003  BP: 138/81  Pulse: 83  Resp: 16  Temp: 98 F (36.7 C)  SpO2: 100%   Filed Weights   05/27/22 1003  Weight: 178 lb 1.6 oz (80.8 kg)    GENERAL: Well-appearing middle-aged African-American female, alert, no distress and comfortable SKIN: skin color, texture, turgor are normal, no rashes or significant lesions EYES: conjunctiva are pink and non-injected, sclera clear LUNGS: clear to auscultation and percussion with normal breathing effort HEART: regular rate & rhythm and no  murmurs and no lower extremity edema Musculoskeletal: no cyanosis of digits and no clubbing  PSYCH: alert & oriented x 3, fluent speech NEURO: no focal motor/sensory deficits  LABORATORY DATA:  I have reviewed the data as listed    Latest Ref Rng & Units 06/02/2022    1:19 PM 05/27/2022    9:31 AM 05/19/2022   10:23 AM  CBC  WBC 4.0 - 10.5 K/uL 5.5  4.8  5.5   Hemoglobin 12.0 - 15.0 g/dL 10.4  9.8  11.6   Hematocrit 36.0 - 46.0 % 32.6  29.1  34.2   Platelets 150 - 400 K/uL 249  247  200        Latest Ref Rng & Units 06/02/2022    1:19 PM 05/27/2022    9:31 AM 05/19/2022   10:23 AM  CMP  Glucose 70 - 99 mg/dL 89  84  99   BUN 6 - 20 mg/dL 7  9  15    Creatinine 0.44 - 1.00 mg/dL 0.62  0.57  0.86   Sodium 135 - 145 mmol/L 141  143  141   Potassium 3.5 - 5.1 mmol/L 3.6  3.3  3.2   Chloride 98 - 111 mmol/L 107  109  106   CO2 22 - 32 mmol/L 27  28  26    Calcium 8.9 - 10.3 mg/dL 9.0  9.0  9.4   Total Protein 6.5 - 8.1 g/dL 6.8  6.7  7.6   Total Bilirubin 0.3 - 1.2 mg/dL 0.4  0.4  0.6   Alkaline Phos 38 - 126 U/L 97  86  80   AST 15 - 41 U/L 13  15  11    ALT 0 - 44 U/L 21  20  19      Lab Results  Component Value Date   MPROTEIN  Not Observed 05/27/2022   MPROTEIN Not Observed 05/12/2022   Lab Results  Component Value Date   KPAFRELGTCHN 24.2 (H) 05/27/2022   KPAFRELGTCHN 228.6 (H) 05/12/2022   KPAFRELGTCHN 1,263.0 (H) 03/03/2022   LAMBDASER 17.9 05/27/2022   LAMBDASER 12.4 05/12/2022   LAMBDASER 6.2 03/03/2022   KAPLAMBRATIO 1.35 05/27/2022   KAPLAMBRATIO 18.44 (H) 05/12/2022   KAPLAMBRATIO 20.45 (H) 03/31/2022     RADIOGRAPHIC STUDIES: DG Chest 2 View  Result Date: 05/10/2022 CLINICAL DATA:  Palpitations.  Chemotherapy EXAM: CHEST - 2 VIEW COMPARISON:  Chest x-ray dated 01/05/2022 FINDINGS: Heart size and mediastinal contours are stable. Lungs are clear. No pleural effusion or pneumothorax is seen. No acute-appearing osseous abnormality. IMPRESSION: No active  cardiopulmonary disease. No evidence of pneumonia or pulmonary edema. Electronically Signed   By: Bary Richard M.D.   On: 05/10/2022 13:27    ASSESSMENT & PLAN Sonya Franklin 57 y.o. female with medical history significant for newly diagnosed multiple myeloma with plasmacytoma who presents for a follow up visit.  # Multiple Myeloma with Plasmactyoma --Confirmed with L5 bone lesion biopsy and bone marrow biopsy. --Started VRD therapy on 05/05/2022.  PLAN: --Due for Cycle 2 Day 1 of VRD therapy today.  --Labs today reviewed and adequate for treatment.  Labs show white blood cell count 4.8, hemoglobin 9.8, MCV 89.3, and platelets of 247 --Last myeloma labs from 05/12/2022 showed significant improvement with kappa free light chain decreased from 1,263 to 228.6, ratio improved from 203.71 to 18.44 --Continue with weekly treatment and q 2 week office visits  #Jitteriness/Palpitations: --Likely secondary to steroid therapy versus abnormal thyroid function. --Dose reduced dexamethasone from 40 mg to 20 mg on Cycle 1 Day 8. --Discussed with Dr. Candise Che in Dr. Derek Mound absence who recommended further dose reduction of dexamethasone to 12 mg versus adding Ativan as premedication. Patient preferred dose reduction of dexamethasone which will start today, Cycle 1, Day 15.  -- Labs from 05/10/2022 showed TSH level 0.199 (L), normal free T4. Not on any thyroid medication. Sent referral to endocrinology to further evaluate.   #Right lower extremity neuropathy: --Greatly improved --Monitor for now.   #Cancer related pain: --Secondary to focal tumor replacement of the right-side of the L5 vertebral body extending into the right L5 pedicle and posterior elements with a small extraosseous component extending into the right L5 neural foramen with moderate-severe stenosi -- Unable to tolerate opoid medications -- Sent lidocaine patches but did not pick up. Advised to try since she is hesitant to try opoid  medications.  -- Made referral to our palliative care service. -- Discussed palliative radiation to help relieve pain if systemic therapy is not alleviating pain.  #Supportive Care -- chemotherapy education completed -- port placement not required.  -- zofran 8mg  q8H PRN and compazine 10mg  PO q6H for nausea -- acyclovir 400mg  PO BID for VCZ prophylaxis -- allopurinol 300mg  PO daily for TLS prophylaxis -- Pain medication as noted above  No orders of the defined types were placed in this encounter.  All questions were answered. The patient knows to call the clinic with any problems, questions or concerns.  A total of more than 30 minutes were spent on this encounter with face-to-face time and non-face-to-face time, including preparing to see the patient, ordering tests and/or medications, counseling the patient and coordination of care as outlined above.   , MD Department of Hematology/Oncology [facility] Phone: [phone] Pager: [phone] Email: [email or web address]  06/04/2022  8:54 AM

## 2022-05-28 ENCOUNTER — Telehealth: Payer: Self-pay

## 2022-05-28 LAB — KAPPA/LAMBDA LIGHT CHAINS
Kappa free light chain: 24.2 mg/L — ABNORMAL HIGH (ref 3.3–19.4)
Kappa, lambda light chain ratio: 1.35 (ref 0.26–1.65)
Lambda free light chains: 17.9 mg/L (ref 5.7–26.3)

## 2022-05-28 NOTE — Telephone Encounter (Signed)
----- Message from Levin Erp, [place] sent at 05/26/2022  3:10 PM EDT ----- Regarding: FW: Pancreatic lesion Can you make sure patient has follow up MRI/MRCP in 6 months (May). Also needs CA 19-9.  Thanks-JLL ----- Message ----- From: Ladene Artist, MD Sent: 05/26/2022  10:06 AM EDT To: Levin Erp, PA; # Subject: RE: Pancreatic lesion                          Agree with repeat MRI/MRCP at 6 months and CA19-9.   ----- Message ----- From: Irving Copas., MD Sent: 05/26/2022   5:52 AM EDT To: Ladene Artist, MD; Levin Erp, [place]; # Subject: RE: Pancreatic lesion                          Some thoughts, Most GI guidelines would recommend that we do not attempt EUS unless the lesion has significantly increased in size over short period of time or is greater than 3 cm.  I recommend since this was a December scan that we repeat a MRI/MRCP at the 75-monthmark which would be May of this year.  This would hopefully also clarify any further increase in size or not.  I recommend a CA 19-9 be obtained as well. But ultimately will defer to patient's primary gastroenterologist, if they want [place] perform earlier EUS then I am fine to get it scheduled in the coming weeks. Thanks. GM ----- Message ----- From: LLevin Erp PA Sent: 05/25/2022  12:45 PM EDT To: MLadene Artist MD; # Subject: FW: Pancreatic lesion                          Hello all,  Recommendations for this patient?  Thanks- JLL ----- Message ----- From: TCordelia PocheSent: 05/25/2022  10:41 AM EDT To: JLevin Erp PA Subject: RE: Pancreatic lesion                          Hi JAnderson Malta  We wanted GI's input on role for EUS with FNA due to increase in size of the pancreatic cystic lesion. If patient would like to monitor for now, can you team follow her with serial imaging? Multiple myeloma generally doesn't require imaging unless patient is symptomatic.    Thanks! IMurray Hodgkins  ----- Message ----- From: LLevin Erp PUtahSent: 05/21/2022  11:06 AM EDT To: ILincoln Brigham PA-C Subject: Pancreatic lesion                              Hi IThreasa Beardsam JAnderson MaltaI work with gastroenterology.  I just saw this patient of yours in clinic today.  She was not exactly sure why she was here so I tried to dig through her notes, it does look like she has a cystic lesion in the pancreas, but also a new diagnosis of multiple myeloma which she is undergoing chemo with for you.  Just wondering if we have a role here?  Not sure if she was referred early on at time of first imaging before diagnosis of multiple myeloma.  We are happy to assist with EUS/FNA and/or colonoscopy if you feel that it would be beneficial for her treatment.  She seems very overwhelmed with everything going on  and I do not want to put her through additional testing that will not change the outcome.  Would love to have your thoughts.  Sincerely, Ellouise Newer, PA-C

## 2022-05-28 NOTE — Telephone Encounter (Signed)
Pt returned call. We reviewed recommendations. Pt knows to expect a reminder call in May for repeat MRCP and lab work. Pt verbalized understanding.

## 2022-05-28 NOTE — Telephone Encounter (Signed)
Lm on vm for patient to return call. MRI/MRCP and lab reminder in [place] for May.

## 2022-05-29 NOTE — Progress Notes (Unsigned)
[place]  Telephone:(336) [phone] Fax:(336) [phone]   Name: Sonya Franklin Date: 05/29/2022 MRN: [id number]  DOB: 02-08-1966  Patient Care Team: Elby Showers, MD as PCP - General (Internal Medicine)    INTERVAL HISTORY: Sonya Franklin is a 57 y.o. 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 Palliative ask to see for symptom management and goals of care.   SOCIAL HISTORY:     reports that she has never smoked. She has never used smokeless tobacco. She reports that she does not drink alcohol and does not use drugs.  ADVANCE DIRECTIVES:  None on file  CODE STATUS: Full code  PAST MEDICAL HISTORY: Past Medical History:  Diagnosis Date   Ankylosing spondylitis ([place])    Anxiety    Arthritis    Breast cancer ([place])    right breast   Bursitis of hip, right    Cardiac arrhythmia    Carotid stenosis 08/26/2021   GE reflux    Hiatal hernia    Hypertension    Hyperthyroidism    Ischemic bowel disease ([place])    Multiple myeloma not having achieved remission ([place])    Prediabetes 08/26/2021   Snoring 03/21/2014   Vitamin D deficiency     ALLERGIES:  is allergic to shellfish allergy, blue dyes (parenteral), and hydrochlorothiazide.  MEDICATIONS:  Current Outpatient Medications  Medication Sig Dispense Refill   acyclovir (ZOVIRAX) 400 MG tablet Take 1 tablet (400 mg total) by mouth 2 (two) times daily. 60 tablet 3   amLODipine (NORVASC) 5 MG tablet TAKE 1 TABLET (5 MG TOTAL) BY MOUTH DAILY. 90 tablet 2   aspirin EC 81 MG tablet Take 81 mg by mouth daily. Swallow whole.     furosemide (LASIX) 20 MG tablet Take 1 tablet (20 mg total) by mouth daily as needed. 10 tablet 1   hyoscyamine (ANASPAZ) 0.125 MG TBDP disintergrating tablet Place 1 tablet (0.125 mg total) under the tongue every 4 (four) hours as needed for cramping. 30 tablet 0    lenalidomide (REVLIMID) 25 MG capsule Take 1 capsule (25 mg total) by mouth daily. Take for 14 days on, then 7 days off. Repeat every 21 days. 14 capsule 0   lidocaine (LIDODERM) 5 % Place 1 patch onto the skin daily. Remove & Discard patch within 12 hours or as directed by MD 30 patch 0   metoprolol tartrate (LOPRESSOR) 25 MG tablet One half  tab up to twice daily if needed for palpitations 30 tablet 0   olopatadine (PATANOL) 0.1 % ophthalmic solution Place 1 drop into both eyes 2 (two) times daily as needed. 5 mL PRN   pantoprazole (PROTONIX) 40 MG tablet TAKE 1 TABLET BY MOUTH EVERY DAY 90 tablet 3   potassium chloride SA (KLOR-CON M) 20 MEQ tablet Take 2 tablets (40 mEq total) by mouth daily. 60 tablet 0   triamcinolone cream (KENALOG) 0.1 % Apply 1 Application topically 3 (three) times daily. 30 g 1   No current facility-administered medications for this visit.    VITAL SIGNS: There were no vitals taken for this visit. There were no vitals filed for this visit.  Estimated body mass index is 33.65 kg/m as calculated from the following:   Height as of 05/25/22: 5\' 1"  (1.549 m).   Weight as of 05/27/22: 178 lb 1.6 oz (80.8 kg).   PERFORMANCE STATUS (ECOG) : 1 - Symptomatic but  completely ambulatory   Physical Exam General: NAD Cardiovascular: regular rate and rhythm Pulmonary: normal breathing pattern  Extremities: no edema, no joint deformities Skin: no rashes Neurological: AAO x4  IMPRESSION: Sonya Franklin presents to clinic today for symptom management follow-up. She is uncomfortable due to lower back pain. Ambulatory with limitations. Shares she has been taking hot baths to allow for some relief. Denies nausea, vomiting, constipation, or diarrhea.   Pain Patient endorses ongoing arthritic and back pain.  Feels pain has worsened since recent adjustment of steroids.  Is hopeful they will increase them back up.  She has been prescribed different medications however is concerned about  taking medications that increases her risk of addiction as well as potential side effects that are researched documented.  States reluctance he to take opioids with fear that they may take her pain away causing her to want to take consistently.  I assured patient we will support her wishes while also acknowledging that of taking medications responsibly under medical supervision she could safely wean off her medications when appropriate.  She verbalized understanding.  Education provided on the use of tramadol to allow for some relief in the setting of severe pain.  He is using lidocaine patches but with minimal relief.  Also with concern that patches are coming out of.  Patient is in agreement with trying tramadol as needed for severe pain.  Otherwise we will plan to continue nonpharmacological methods to provide some comfort such as hot baths and warm compresses.    PLAN:  Lidoderm patch every 24 hours Tramadol 50 mg every 8 hours as needed for severe pain. Extensive discussion regarding nonpharmacological methods to assist with her pain and discomfort.  Education also provided on use of oral medications.  Patient verbalized understanding and knows to contact office as needed. I will plan to see patient back in 2-4 weeks in collaboration to other oncology appointments.    Patient expressed understanding and was in agreement with this plan. She also understands that She can call the clinic at any time with any questions, concerns, or complaints.   Any controlled substances utilized were prescribed in the context of palliative care. PDMP has been reviewed.   Time Total: 20 min   Visit consisted of counseling and education dealing with the complex and emotionally intense issues of symptom management and palliative care in the setting of serious and potentially life-threatening illness.Greater than 50%  of this time was spent counseling and coordinating care related to the above assessment and  plan.  Alda Lea, AGPCNP-BC  Palliative Medicine Team/ [place]  *Please note that this is a verbal dictation therefore any spelling or grammatical errors are due to the "[place] [facility]" system interpretation.

## 2022-06-02 ENCOUNTER — Inpatient Hospital Stay (HOSPITAL_BASED_OUTPATIENT_CLINIC_OR_DEPARTMENT_OTHER): Payer: Medicare Other | Admitting: Nurse Practitioner

## 2022-06-02 ENCOUNTER — Other Ambulatory Visit: Payer: Self-pay

## 2022-06-02 ENCOUNTER — Other Ambulatory Visit: Payer: Self-pay | Admitting: Hematology and Oncology

## 2022-06-02 ENCOUNTER — Inpatient Hospital Stay: Payer: Medicare Other

## 2022-06-02 ENCOUNTER — Encounter: Payer: Self-pay | Admitting: Nurse Practitioner

## 2022-06-02 VITALS — BP 151/82 | HR 90 | Resp 18

## 2022-06-02 VITALS — BP 141/66 | HR 68 | Temp 97.7°F | Resp 18 | Ht 61.0 in | Wt 178.4 lb

## 2022-06-02 DIAGNOSIS — M459 Ankylosing spondylitis of unspecified sites in spine: Secondary | ICD-10-CM | POA: Diagnosis not present

## 2022-06-02 DIAGNOSIS — Z5112 Encounter for antineoplastic immunotherapy: Secondary | ICD-10-CM | POA: Diagnosis not present

## 2022-06-02 DIAGNOSIS — C9000 Multiple myeloma not having achieved remission: Secondary | ICD-10-CM

## 2022-06-02 DIAGNOSIS — Z7961 Long term (current) use of immunomodulator: Secondary | ICD-10-CM | POA: Diagnosis not present

## 2022-06-02 DIAGNOSIS — G893 Neoplasm related pain (acute) (chronic): Secondary | ICD-10-CM | POA: Diagnosis not present

## 2022-06-02 DIAGNOSIS — Z515 Encounter for palliative care: Secondary | ICD-10-CM

## 2022-06-02 DIAGNOSIS — M48061 Spinal stenosis, lumbar region without neurogenic claudication: Secondary | ICD-10-CM | POA: Diagnosis not present

## 2022-06-02 DIAGNOSIS — M7989 Other specified soft tissue disorders: Secondary | ICD-10-CM | POA: Diagnosis not present

## 2022-06-02 DIAGNOSIS — R002 Palpitations: Secondary | ICD-10-CM | POA: Diagnosis not present

## 2022-06-02 DIAGNOSIS — I10 Essential (primary) hypertension: Secondary | ICD-10-CM | POA: Diagnosis not present

## 2022-06-02 DIAGNOSIS — Z7982 Long term (current) use of aspirin: Secondary | ICD-10-CM | POA: Diagnosis not present

## 2022-06-02 DIAGNOSIS — Z9013 Acquired absence of bilateral breasts and nipples: Secondary | ICD-10-CM | POA: Diagnosis not present

## 2022-06-02 DIAGNOSIS — Z803 Family history of malignant neoplasm of breast: Secondary | ICD-10-CM | POA: Diagnosis not present

## 2022-06-02 DIAGNOSIS — K219 Gastro-esophageal reflux disease without esophagitis: Secondary | ICD-10-CM | POA: Diagnosis not present

## 2022-06-02 DIAGNOSIS — R530 Neoplastic (malignant) related fatigue: Secondary | ICD-10-CM

## 2022-06-02 DIAGNOSIS — E559 Vitamin D deficiency, unspecified: Secondary | ICD-10-CM | POA: Diagnosis not present

## 2022-06-02 DIAGNOSIS — Z79899 Other long term (current) drug therapy: Secondary | ICD-10-CM | POA: Diagnosis not present

## 2022-06-02 LAB — CBC WITH DIFFERENTIAL (CANCER CENTER ONLY)
Abs Immature Granulocytes: 0.04 10*3/uL (ref 0.00–0.07)
Basophils Absolute: 0 10*3/uL (ref 0.0–0.1)
Basophils Relative: 1 %
Eosinophils Absolute: 0.2 10*3/uL (ref 0.0–0.5)
Eosinophils Relative: 4 %
HCT: 32.6 % — ABNORMAL LOW (ref 36.0–46.0)
Hemoglobin: 10.4 g/dL — ABNORMAL LOW (ref 12.0–15.0)
Immature Granulocytes: 1 %
Lymphocytes Relative: 18 %
Lymphs Abs: 1 10*3/uL (ref 0.7–4.0)
MCH: 28.9 pg (ref 26.0–34.0)
MCHC: 31.9 g/dL (ref 30.0–36.0)
MCV: 90.6 fL (ref 80.0–100.0)
Monocytes Absolute: 0.5 10*3/uL (ref 0.1–1.0)
Monocytes Relative: 9 %
Neutro Abs: 3.7 10*3/uL (ref 1.7–7.7)
Neutrophils Relative %: 67 %
Platelet Count: 249 10*3/uL (ref 150–400)
RBC: 3.6 MIL/uL — ABNORMAL LOW (ref 3.87–5.11)
RDW: 13.9 % (ref 11.5–15.5)
WBC Count: 5.5 10*3/uL (ref 4.0–10.5)
nRBC: 0 % (ref 0.0–0.2)

## 2022-06-02 LAB — MULTIPLE MYELOMA PANEL, SERUM
Albumin SerPl Elph-Mcnc: 3.7 g/dL (ref 2.9–4.4)
Albumin/Glob SerPl: 1.5 (ref 0.7–1.7)
Alpha 1: 0.2 g/dL (ref 0.0–0.4)
Alpha2 Glob SerPl Elph-Mcnc: 0.7 g/dL (ref 0.4–1.0)
B-Globulin SerPl Elph-Mcnc: 0.8 g/dL (ref 0.7–1.3)
Gamma Glob SerPl Elph-Mcnc: 0.8 g/dL (ref 0.4–1.8)
Globulin, Total: 2.5 g/dL (ref 2.2–3.9)
IgA: 137 mg/dL (ref 87–352)
IgG (Immunoglobin G), Serum: 821 mg/dL (ref 586–1602)
IgM (Immunoglobulin M), Srm: 28 mg/dL (ref 26–217)
Total Protein ELP: 6.2 g/dL (ref 6.0–8.5)

## 2022-06-02 LAB — CMP (CANCER CENTER ONLY)
ALT: 21 U/L (ref 0–44)
AST: 13 U/L — ABNORMAL LOW (ref 15–41)
Albumin: 4.1 g/dL (ref 3.5–5.0)
Alkaline Phosphatase: 97 U/L (ref 38–126)
Anion gap: 7 (ref 5–15)
BUN: 7 mg/dL (ref 6–20)
CO2: 27 mmol/L (ref 22–32)
Calcium: 9 mg/dL (ref 8.9–10.3)
Chloride: 107 mmol/L (ref 98–111)
Creatinine: 0.62 mg/dL (ref 0.44–1.00)
GFR, Estimated: >60 mL/min (ref >60)
Glucose, Bld: 89 mg/dL (ref 70–99)
Potassium: 3.6 mmol/L (ref 3.5–5.1)
Sodium: 141 mmol/L (ref 135–145)
Total Bilirubin: 0.4 mg/dL (ref 0.3–1.2)
Total Protein: 6.8 g/dL (ref 6.5–8.1)

## 2022-06-02 MED ORDER — SODIUM CHLORIDE 0.9 % IV SOLN: INTRAVENOUS | Status: AC

## 2022-06-02 MED ORDER — DEXAMETHASONE 4 MG PO TABS
12.0000 mg | ORAL_TABLET | Freq: Once | ORAL | Status: AC
  Administered 2022-06-02: 12 mg via ORAL
  Filled 2022-06-02: qty 3

## 2022-06-02 MED ORDER — LIDOCAINE 5 % EX PTCH: 1.0000 | MEDICATED_PATCH | CUTANEOUS | 0 refills | Status: DC

## 2022-06-02 MED ORDER — TRAMADOL HCL 50 MG PO TABS: 50.0000 mg | ORAL_TABLET | Freq: Three times a day (TID) | ORAL | 0 refills | Status: DC | PRN

## 2022-06-02 MED ORDER — BORTEZOMIB CHEMO SQ INJECTION 3.5 MG (2.5MG/ML)
1.3000 mg/m2 | Freq: Once | INTRAMUSCULAR | Status: AC
  Administered 2022-06-02: 2.5 mg via SUBCUTANEOUS
  Filled 2022-06-02: qty 1

## 2022-06-02 NOTE — Patient Instructions (Signed)
[facility]  Discharge Instructions: Thank you for choosing [facility] to provide your oncology and hematology care.   If you have a lab appointment with the Cancer Center, please go directly to the Cancer Center and check in at the registration area.   Wear comfortable clothing and clothing appropriate for easy access to any Portacath or PICC line.   We strive to give you quality time with your provider. You may need to reschedule your appointment if you arrive late (15 or more minutes).  Arriving late affects you and other patients whose appointments are after yours.  Also, if you miss three or more appointments without notifying the office, you may be dismissed from the clinic at the provider's discretion.      For prescription refill requests, have your pharmacy contact our office and allow 72 hours for refills to be completed.    Today you received the following chemotherapy and/or immunotherapy agents: bortezomib      To help prevent nausea and vomiting after your treatment, we encourage you to take your nausea medication as directed.  BELOW ARE SYMPTOMS THAT SHOULD BE REPORTED IMMEDIATELY: *FEVER GREATER THAN 100.4 F (38 C) OR HIGHER *CHILLS OR SWEATING *NAUSEA AND VOMITING THAT IS NOT CONTROLLED WITH YOUR NAUSEA MEDICATION *UNUSUAL SHORTNESS OF BREATH *UNUSUAL BRUISING OR BLEEDING *URINARY PROBLEMS (pain or burning when urinating, or frequent urination) *BOWEL PROBLEMS (unusual diarrhea, constipation, pain near the anus) TENDERNESS IN MOUTH AND THROAT WITH OR WITHOUT PRESENCE OF ULCERS (sore throat, sores in mouth, or a toothache) UNUSUAL RASH, SWELLING OR PAIN  UNUSUAL VAGINAL DISCHARGE OR ITCHING   Items with * indicate a potential emergency and should be followed up as soon as possible or go to the Emergency Department if any problems should occur.  Please show the CHEMOTHERAPY ALERT CARD or IMMUNOTHERAPY ALERT CARD at  check-in to the Emergency Department and triage nurse.  Should you have questions after your visit or need to cancel or reschedule your appointment, please contact [facility]  Dept: [phone]  and follow the prompts.  Office hours are 8:00 a.m. to 4:30 p.m. Monday - Friday. Please note that voicemails left after 4:00 p.m. may not be returned until the following business day.  We are closed weekends and major holidays. You have access to a nurse at all times for urgent questions. Please call the main number to the clinic Dept: [phone] and follow the prompts.   For any non-urgent questions, you may also contact your provider using MyChart. We now offer e-Visits for anyone 18 and older to request care online for non-urgent symptoms. For details visit [email or web address].   Also download the MyChart app! Go to the app store, search "MyChart", open the app, select [place], and log in with your MyChart username and password.   

## 2022-06-04 ENCOUNTER — Encounter: Payer: Self-pay | Admitting: Hematology and Oncology

## 2022-06-07 ENCOUNTER — Other Ambulatory Visit: Payer: Self-pay | Admitting: Hematology and Oncology

## 2022-06-07 NOTE — Patient Instructions (Addendum)
It is not clear to me what is causing low TSH.  I am referring her to Endocrinologist for further evaluation.  She is having ultrasound of the thyroid in April.

## 2022-06-08 NOTE — Progress Notes (Unsigned)
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  05/05/2022: Cycle 1 Day 1 of VRD therapy 05/27/2022: Cycle 2 Day 1 of VRD therapy  Interval History:  Sonya Franklin 57 y.o. female with medical history significant for recently diagnosed multiple myeloma with plasmacytoma who presents for a follow up visit. The patient's last visit was on 05/27/2022. She presents today to start Cycle 2, Day 15 of VRD therapy.   On exam today Sonya Franklin reports she is having some feet swelling, some pain, and anxiety.  She reports that she is taking a Lasix pill which helps keep her feet from getting too puffy.  She reports they look good today compared to where they were the other day.  She reports that the Velcade shots are going well and not causing any major side effects.  She does have a bit of "chemo brain" and feels like she is in a fog.  She is not having any trouble with nausea, vomiting, or diarrhea.  Her degree symptoms have improved with treatment of her thyroid issues.  She does have an upcoming visit with endocrinology.  Overall she is willing and able to proceed with treatment at this time.. She denies fevers, chills, sweats, shortness of breath, chest pain or cough. She has no other complaints. Rest of the 10 point ROS is below.   MEDICAL HISTORY:  Past Medical History:  Diagnosis Date   Ankylosing spondylitis ([place])    Anxiety    Arthritis    Breast cancer ([place])    right breast   Bursitis of hip, right    Cardiac arrhythmia    Carotid stenosis 08/26/2021   GE reflux    Hiatal hernia    Hypertension    Hyperthyroidism     Ischemic bowel disease ([place])    Multiple myeloma not having achieved remission ([place])    Prediabetes 08/26/2021   Snoring 03/21/2014   Vitamin D deficiency     SURGICAL HISTORY: Past Surgical History:  Procedure Laterality Date   ABDOMINAL HYSTERECTOMY     BREAST LUMPECTOMY     right   MASTECTOMY     double    SOCIAL HISTORY: Social History   Socioeconomic History   Marital status: Divorced    Spouse name: Not on file   Number of children: 1   Years of education: Not on file   Highest education level: Not on file  Occupational History    Employer: UNEMPLOYED  Tobacco Use   Smoking status: Never   Smokeless tobacco: Never  Vaping Use   Vaping Use: Never used  Substance and Sexual Activity   Alcohol use: No   Drug use: No   Sexual activity: Yes  Other Topics Concern   Not on file  Social History Narrative   Caffeine occasional drinker.   Not working/disabled.  12th grader. Divorced, one kids.       Social history: She receives disability benefits for history of breast cancer 2007 and ankylosing spondylitis.  She resides with special needs son.  She is divorced.  Non-smoker.  No alcohol consumption.       Family history: Father with history of prostate cancer.  Patient's  mother was diagnosed with breast cancer at age 61.  Maternal aunt with history of breast cancer.  3 brothers.   Right handed   Caffeine occas   One level stays on this level       Social Determinants of Health   Financial Resource Strain: Low Risk  (08/26/2021)   Overall Financial Resource Strain (CARDIA)    Difficulty of Paying Living Expenses: Not hard at all  Food Insecurity: No Food Insecurity (05/25/2022)   Hunger Vital Sign    Worried About Running Out of Food in the Last Year: Never true    Ran Out of Food in the Last Year: Never true  Transportation Needs: No Transportation Needs (08/26/2021)   PRAPARE - Hydrologist (Medical): No    Lack of Transportation  (Non-Medical): No  Physical Activity: Inactive (08/26/2021)   Exercise Vital Sign    Days of Exercise per Week: 0 days    Minutes of Exercise per Session: 0 min  Stress: Not on file  Social Connections: Not on file  Intimate Partner Violence: Not At Risk (04/21/2022)   Humiliation, Afraid, Rape, and Kick questionnaire    Fear of Current or Ex-Partner: No    Emotionally Abused: No    Physically Abused: No    Sexually Abused: No    FAMILY HISTORY: Family History  Problem Relation Age of Onset   Diabetes Mother    Hypertension Mother    Hyperlipidemia Mother    High blood pressure Father    High Cholesterol Father    Colitis Brother    Goiter Maternal Grandmother    Breast cancer Maternal Aunt     ALLERGIES:  is allergic to shellfish allergy, blue dyes (parenteral), and hydrochlorothiazide.  MEDICATIONS:  Current Outpatient Medications  Medication Sig Dispense Refill   amLODipine (NORVASC) 5 MG tablet TAKE 1 TABLET (5 MG TOTAL) BY MOUTH DAILY. 90 tablet 2   aspirin EC 81 MG tablet Take 81 mg by mouth daily. Swallow whole.     hyoscyamine (ANASPAZ) 0.125 MG TBDP disintergrating tablet Place 1 tablet (0.125 mg total) under the tongue every 4 (four) hours as needed for cramping. 30 tablet 0   lenalidomide (REVLIMID) 25 MG capsule Take 1 capsule (25 mg total) by mouth daily. Take for 14 days on, then 7 days off. Repeat every 21 days. 14 capsule 0   lidocaine (LIDODERM) 5 % Place 1 patch onto the skin daily. Remove & Discard patch within 12 hours or as directed by MD 30 patch 0   metoprolol tartrate (LOPRESSOR) 25 MG tablet One half  tab up to twice daily if needed for palpitations 30 tablet 0   olopatadine (PATANOL) 0.1 % ophthalmic solution Place 1 drop into both eyes 2 (two) times daily as needed. 5 mL PRN   pantoprazole (PROTONIX) 40 MG tablet TAKE 1 TABLET BY MOUTH EVERY DAY 90 tablet 3   potassium chloride SA (KLOR-CON M) 20 MEQ tablet Take 2 tablets (40 mEq total) by mouth daily.  60 tablet 0   traMADol (ULTRAM) 50 MG tablet Take 1 tablet (50 mg total) by mouth every 8 (eight) hours as needed for moderate pain. 30 tablet 0   triamcinolone cream (KENALOG) 0.1 % Apply 1 Application topically 3 (three) times daily. 30 g 1   acyclovir (ZOVIRAX) 400 MG tablet Take 1 tablet (400 mg total) by mouth 2 (two) times daily. (Patient not taking: Reported on 06/09/2022) 60 tablet 3   furosemide (LASIX)  20 MG tablet Take 1 tablet (20 mg total) by mouth daily as needed. 10 tablet 1   No current facility-administered medications for this visit.   Facility-Administered Medications Ordered in Other Visits  Medication Dose Route Frequency Provider Last Rate Last Admin   0.9 %  sodium chloride infusion   Intravenous Continuous Ledell Peoples IV, MD 500 mL/hr at 06/09/22 1247 New Bag at 06/09/22 1247   bortezomib SQ (VELCADE) chemo injection (2.5mg /mL concentration) 2.5 mg  1.3 mg/m2 (Treatment Plan Recorded) Subcutaneous Once Orson Slick, MD        REVIEW OF SYSTEMS:   Constitutional: ( - ) fevers, ( - )  chills , ( - ) night sweats Eyes: ( - ) blurriness of vision, ( - ) double vision, ( - ) watery eyes Ears, nose, mouth, throat, and face: ( - ) mucositis, ( - ) sore throat Respiratory: ( - ) cough, ( - ) dyspnea, ( - ) wheezes Cardiovascular: ( - ) palpitation, ( - ) chest discomfort, ( - ) lower extremity swelling Gastrointestinal:  ( - ) nausea, ( - ) heartburn, ( - ) change in bowel habits Skin: ( - ) abnormal skin rashes Lymphatics: ( - ) new lymphadenopathy, ( - ) easy bruising Neurological: ( - ) numbness, ( - ) tingling, ( - ) new weaknesses Behavioral/Psych: ( - ) mood change, ( - ) new changes  All other systems were reviewed with the patient and are negative.  PHYSICAL EXAMINATION: ECOG PERFORMANCE STATUS: 1 - Symptomatic but completely ambulatory  Vitals:   06/09/22 1200  BP: 139/71  Pulse: 76  Resp: 18  Temp: (!) 97.2 F (36.2 C)  SpO2: 100%    Filed  Weights   06/09/22 1200  Weight: 177 lb 14.4 oz (80.7 kg)     GENERAL: Well-appearing middle-aged African-American female, alert, no distress and comfortable SKIN: skin color, texture, turgor are normal, no rashes or significant lesions EYES: conjunctiva are pink and non-injected, sclera clear LUNGS: clear to auscultation and percussion with normal breathing effort HEART: regular rate & rhythm and no murmurs and no lower extremity edema Musculoskeletal: no cyanosis of digits and no clubbing  PSYCH: alert & oriented x 3, fluent speech NEURO: no focal motor/sensory deficits  LABORATORY DATA:  I have reviewed the data as listed    Latest Ref Rng & Units 06/09/2022   11:40 AM 06/02/2022    1:19 PM 05/27/2022    9:31 AM  CBC  WBC 4.0 - 10.5 K/uL 6.3  5.5  4.8   Hemoglobin 12.0 - 15.0 g/dL 11.1  10.4  9.8   Hematocrit 36.0 - 46.0 % 33.7  32.6  29.1   Platelets 150 - 400 K/uL 229  249  247        Latest Ref Rng & Units 06/09/2022   11:40 AM 06/02/2022    1:19 PM 05/27/2022    9:31 AM  CMP  Glucose 70 - 99 mg/dL 81  89  84   BUN 6 - 20 mg/dL 16  7  9    Creatinine 0.44 - 1.00 mg/dL 0.75  0.62  0.57   Sodium 135 - 145 mmol/L 143  141  143   Potassium 3.5 - 5.1 mmol/L 3.2  3.6  3.3   Chloride 98 - 111 mmol/L 107  107  109   CO2 22 - 32 mmol/L 28  27  28    Calcium 8.9 - 10.3 mg/dL 9.3  9.0  9.0   Total Protein 6.5 - 8.1 g/dL 7.0  6.8  6.7   Total Bilirubin 0.3 - 1.2 mg/dL 0.5  0.4  0.4   Alkaline Phos 38 - 126 U/L 91  97  86   AST 15 - 41 U/L 13  13  15    ALT 0 - 44 U/L 23  21  20      Lab Results  Component Value Date   MPROTEIN Not Observed 05/27/2022   MPROTEIN Not Observed 05/12/2022   Lab Results  Component Value Date   KPAFRELGTCHN 24.2 (H) 05/27/2022   KPAFRELGTCHN 228.6 (H) 05/12/2022   KPAFRELGTCHN 1,263.0 (H) 03/03/2022   LAMBDASER 17.9 05/27/2022   LAMBDASER 12.4 05/12/2022   LAMBDASER 6.2 03/03/2022   KAPLAMBRATIO 1.35 05/27/2022   KAPLAMBRATIO 18.44 (H)  05/12/2022   KAPLAMBRATIO 20.45 (H) 03/31/2022     RADIOGRAPHIC STUDIES: No results found.  ASSESSMENT & PLAN Sonya Franklin 57 y.o. female with medical history significant for newly diagnosed multiple myeloma with plasmacytoma who presents for a follow up visit.  # Multiple Myeloma with Plasmactyoma --Confirmed with L5 bone lesion biopsy and bone marrow biopsy. --Started VRD therapy on 05/05/2022.  PLAN: --Due for Cycle 2 Day 1 of VRD therapy today.  --Labs today reviewed and adequate for treatment.  Labs show white blood cell count 6.3, hemoglobin 11.1, MCV 88.5, and platelets of 229. --Last myeloma labs from 05/12/2022 showed significant improvement with kappa free light chain decreased from 228.6 to 24.2, ratio improved from 18.44 to 1.35 --Continue with weekly treatment and q 2 week office visits  #Jitteriness/Palpitations: --Likely secondary to steroid therapy versus abnormal thyroid function, symptoms improving with thyroid management.  --recommend increase steroid back to full dose at 40mg  PO dexamethasone.   #Right lower extremity neuropathy: --Greatly improved --Monitor for now.   #Cancer related pain: --Secondary to focal tumor replacement of the right-side of the L5 vertebral body extending into the right L5 pedicle and posterior elements with a small extraosseous component extending into the right L5 neural foramen with moderate-severe stenosi -- Unable to tolerate opoid medications -- Sent lidocaine patches but did not pick up. Advised to try since she is hesitant to try opoid medications.  -- Made referral to our palliative care service. -- Discussed palliative radiation to help relieve pain if systemic therapy is not alleviating pain.  #Supportive Care -- chemotherapy education completed -- port placement not required.  -- zofran 8mg  q8H PRN and compazine 10mg  PO q6H for nausea -- acyclovir 400mg  PO BID for VCZ prophylaxis -- allopurinol 300mg  PO daily for TLS  prophylaxis -- Pain medication as noted above  Orders Placed This Encounter  Procedures   Multiple Myeloma Panel (SPEP&IFE w/QIG)    Standing Status:   Future    Standing Expiration Date:   07/08/2023   Kappa/lambda light chains    Standing Status:   Future    Standing Expiration Date:   07/08/2023   CBC with Differential ([place] Only)    Standing Status:   Future    Standing Expiration Date:   07/08/2023   CMP ([place] only)    Standing Status:   Future    Standing Expiration Date:   07/08/2023   CBC with Differential ([place] Only)    Standing Status:   Future    Standing Expiration Date:   07/15/2023   CMP ([place] only)    Standing Status:   Future    Standing Expiration Date:  07/15/2023   CBC with Differential ([place] Only)    Standing Status:   Future    Standing Expiration Date:   07/22/2023   CMP ([place] only)    Standing Status:   Future    Standing Expiration Date:   07/22/2023   All questions were answered. The patient knows to call the clinic with any problems, questions or concerns.  A total of more than 30 minutes were spent on this encounter with face-to-face time and non-face-to-face time, including preparing to see the patient, ordering tests and/or medications, counseling the patient and coordination of care as outlined above.   Ledell Peoples, MD Department of Hematology/Oncology [place] [facility] Phone: [phone] Pager: [phone] Email: [email or web address]  06/09/2022 1:26 PM

## 2022-06-09 ENCOUNTER — Inpatient Hospital Stay: Payer: Medicare Other

## 2022-06-09 ENCOUNTER — Other Ambulatory Visit: Payer: Self-pay | Admitting: Internal Medicine

## 2022-06-09 ENCOUNTER — Encounter: Payer: Self-pay | Admitting: Hematology and Oncology

## 2022-06-09 ENCOUNTER — Inpatient Hospital Stay: Payer: Medicare Other | Admitting: Hematology and Oncology

## 2022-06-09 VITALS — BP 139/71 | HR 76 | Temp 97.2°F | Resp 18 | Ht 61.0 in | Wt 177.9 lb

## 2022-06-09 VITALS — BP 136/57 | HR 79 | Resp 17

## 2022-06-09 DIAGNOSIS — C9000 Multiple myeloma not having achieved remission: Secondary | ICD-10-CM

## 2022-06-09 DIAGNOSIS — R002 Palpitations: Secondary | ICD-10-CM | POA: Diagnosis not present

## 2022-06-09 DIAGNOSIS — G893 Neoplasm related pain (acute) (chronic): Secondary | ICD-10-CM

## 2022-06-09 DIAGNOSIS — Z5112 Encounter for antineoplastic immunotherapy: Secondary | ICD-10-CM | POA: Diagnosis not present

## 2022-06-09 DIAGNOSIS — K219 Gastro-esophageal reflux disease without esophagitis: Secondary | ICD-10-CM | POA: Diagnosis not present

## 2022-06-09 DIAGNOSIS — E559 Vitamin D deficiency, unspecified: Secondary | ICD-10-CM | POA: Diagnosis not present

## 2022-06-09 DIAGNOSIS — Z79899 Other long term (current) drug therapy: Secondary | ICD-10-CM | POA: Diagnosis not present

## 2022-06-09 DIAGNOSIS — Z803 Family history of malignant neoplasm of breast: Secondary | ICD-10-CM | POA: Diagnosis not present

## 2022-06-09 DIAGNOSIS — Z7961 Long term (current) use of immunomodulator: Secondary | ICD-10-CM | POA: Diagnosis not present

## 2022-06-09 DIAGNOSIS — M48061 Spinal stenosis, lumbar region without neurogenic claudication: Secondary | ICD-10-CM | POA: Diagnosis not present

## 2022-06-09 DIAGNOSIS — Z515 Encounter for palliative care: Secondary | ICD-10-CM

## 2022-06-09 DIAGNOSIS — I10 Essential (primary) hypertension: Secondary | ICD-10-CM | POA: Diagnosis not present

## 2022-06-09 DIAGNOSIS — Z9013 Acquired absence of bilateral breasts and nipples: Secondary | ICD-10-CM | POA: Diagnosis not present

## 2022-06-09 DIAGNOSIS — Z7982 Long term (current) use of aspirin: Secondary | ICD-10-CM | POA: Diagnosis not present

## 2022-06-09 DIAGNOSIS — M459 Ankylosing spondylitis of unspecified sites in spine: Secondary | ICD-10-CM | POA: Diagnosis not present

## 2022-06-09 DIAGNOSIS — M7989 Other specified soft tissue disorders: Secondary | ICD-10-CM | POA: Diagnosis not present

## 2022-06-09 LAB — CBC WITH DIFFERENTIAL (CANCER CENTER ONLY)
Abs Immature Granulocytes: 0.05 10*3/uL (ref 0.00–0.07)
Basophils Absolute: 0.1 10*3/uL (ref 0.0–0.1)
Basophils Relative: 1 %
Eosinophils Absolute: 0.2 10*3/uL (ref 0.0–0.5)
Eosinophils Relative: 3 %
HCT: 33.7 % — ABNORMAL LOW (ref 36.0–46.0)
Hemoglobin: 11.1 g/dL — ABNORMAL LOW (ref 12.0–15.0)
Immature Granulocytes: 1 %
Lymphocytes Relative: 14 %
Lymphs Abs: 0.9 10*3/uL (ref 0.7–4.0)
MCH: 29.1 pg (ref 26.0–34.0)
MCHC: 32.9 g/dL (ref 30.0–36.0)
MCV: 88.5 fL (ref 80.0–100.0)
Monocytes Absolute: 1 10*3/uL (ref 0.1–1.0)
Monocytes Relative: 15 %
Neutro Abs: 4.1 10*3/uL (ref 1.7–7.7)
Neutrophils Relative %: 66 %
Platelet Count: 229 10*3/uL (ref 150–400)
RBC: 3.81 MIL/uL — ABNORMAL LOW (ref 3.87–5.11)
RDW: 14 % (ref 11.5–15.5)
WBC Count: 6.3 10*3/uL (ref 4.0–10.5)
nRBC: 0 % (ref 0.0–0.2)

## 2022-06-09 LAB — CMP (CANCER CENTER ONLY)
ALT: 23 U/L (ref 0–44)
AST: 13 U/L — ABNORMAL LOW (ref 15–41)
Albumin: 4.3 g/dL (ref 3.5–5.0)
Alkaline Phosphatase: 91 U/L (ref 38–126)
Anion gap: 8 (ref 5–15)
BUN: 16 mg/dL (ref 6–20)
CO2: 28 mmol/L (ref 22–32)
Calcium: 9.3 mg/dL (ref 8.9–10.3)
Chloride: 107 mmol/L (ref 98–111)
Creatinine: 0.75 mg/dL (ref 0.44–1.00)
GFR, Estimated: >60 mL/min (ref >60)
Glucose, Bld: 81 mg/dL (ref 70–99)
Potassium: 3.2 mmol/L — ABNORMAL LOW (ref 3.5–5.1)
Sodium: 143 mmol/L (ref 135–145)
Total Bilirubin: 0.5 mg/dL (ref 0.3–1.2)
Total Protein: 7 g/dL (ref 6.5–8.1)

## 2022-06-09 MED ORDER — DEXAMETHASONE 4 MG PO TABS
8.0000 mg | ORAL_TABLET | Freq: Once | ORAL | Status: AC
  Administered 2022-06-09: 8 mg via ORAL
  Filled 2022-06-09: qty 2

## 2022-06-09 MED ORDER — SODIUM CHLORIDE 0.9 % IV SOLN: INTRAVENOUS | Status: AC

## 2022-06-09 MED ORDER — DEXAMETHASONE 4 MG PO TABS
12.0000 mg | ORAL_TABLET | Freq: Once | ORAL | Status: AC
  Administered 2022-06-09: 12 mg via ORAL
  Filled 2022-06-09: qty 3

## 2022-06-09 MED ORDER — BORTEZOMIB CHEMO SQ INJECTION 3.5 MG (2.5MG/ML)
1.3000 mg/m2 | Freq: Once | INTRAMUSCULAR | Status: AC
  Administered 2022-06-09: 2.5 mg via SUBCUTANEOUS
  Filled 2022-06-09: qty 1

## 2022-06-09 NOTE — Patient Instructions (Signed)
[facility] [facility]  Discharge Instructions: Thank you for choosing [facility] to provide your oncology and hematology care.   If you have a lab appointment with the Cancer Center, please go directly to the Cancer Center and check in at the registration area.   Wear comfortable clothing and clothing appropriate for easy access to any Portacath or PICC line.   We strive to give you quality time with your provider. You may need to reschedule your appointment if you arrive late (15 or more minutes).  Arriving late affects you and other patients whose appointments are after yours.  Also, if you miss three or more appointments without notifying the office, you may be dismissed from the clinic at the provider's discretion.      For prescription refill requests, have your pharmacy contact our office and allow 72 hours for refills to be completed.    Today you received the following chemotherapy and/or immunotherapy agents: bortezomib      To help prevent nausea and vomiting after your treatment, we encourage you to take your nausea medication as directed.  BELOW ARE SYMPTOMS THAT SHOULD BE REPORTED IMMEDIATELY: *FEVER GREATER THAN 100.4 F (38 C) OR HIGHER *CHILLS OR SWEATING *NAUSEA AND VOMITING THAT IS NOT CONTROLLED WITH YOUR NAUSEA MEDICATION *UNUSUAL SHORTNESS OF BREATH *UNUSUAL BRUISING OR BLEEDING *URINARY PROBLEMS (pain or burning when urinating, or frequent urination) *BOWEL PROBLEMS (unusual diarrhea, constipation, pain near the anus) TENDERNESS IN MOUTH AND THROAT WITH OR WITHOUT PRESENCE OF ULCERS (sore throat, sores in mouth, or a toothache) UNUSUAL RASH, SWELLING OR PAIN  UNUSUAL VAGINAL DISCHARGE OR ITCHING   Items with * indicate a potential emergency and should be followed up as soon as possible or go to the Emergency Department if any problems should occur.  Please show the CHEMOTHERAPY ALERT CARD or IMMUNOTHERAPY ALERT CARD at  check-in to the Emergency Department and triage nurse.  Should you have questions after your visit or need to cancel or reschedule your appointment, please contact [facility] [facility]  Dept: [phone]  and follow the prompts.  Office hours are 8:00 a.m. to 4:30 p.m. Monday - Friday. Please note that voicemails left after 4:00 p.m. may not be returned until the following business day.  We are closed weekends and major holidays. You have access to a nurse at all times for urgent questions. Please call the main number to the clinic Dept: [phone] and follow the prompts.   For any non-urgent questions, you may also contact your provider using MyChart. We now offer e-Visits for anyone 18 and older to request care online for non-urgent symptoms. For details visit [email or web address].   Also download the MyChart app! Go to the app store, search "MyChart", open the app, select [place], and log in with your MyChart username and password.   

## 2022-06-09 NOTE — Progress Notes (Signed)
VO received from Dr. Lorenso Courier for an additional 8 mg Dexamethasone po.

## 2022-06-11 ENCOUNTER — Telehealth: Payer: Self-pay | Admitting: Hematology and Oncology

## 2022-06-11 ENCOUNTER — Other Ambulatory Visit: Payer: Self-pay | Admitting: *Deleted

## 2022-06-11 DIAGNOSIS — C9000 Multiple myeloma not having achieved remission: Secondary | ICD-10-CM

## 2022-06-11 MED ORDER — LENALIDOMIDE 25 MG PO CAPS: 25.0000 mg | ORAL_CAPSULE | Freq: Every day | ORAL | 0 refills | Status: DC

## 2022-06-11 NOTE — Telephone Encounter (Signed)
Lenalidomide refilled per Dr. Libby Maw [facility] note 05/20/22

## 2022-06-11 NOTE — Telephone Encounter (Signed)
Reached out to patient to schedule per  left voicemail.

## 2022-06-11 NOTE — Telephone Encounter (Signed)
Lenalidomide sent via escribe to [facility] - RX printed in error

## 2022-06-17 ENCOUNTER — Inpatient Hospital Stay: Payer: Medicare Other | Admitting: Physician Assistant

## 2022-06-17 ENCOUNTER — Inpatient Hospital Stay: Payer: Medicare Other | Attending: Physician Assistant

## 2022-06-17 ENCOUNTER — Other Ambulatory Visit: Payer: Self-pay

## 2022-06-17 ENCOUNTER — Other Ambulatory Visit: Payer: Self-pay | Admitting: Internal Medicine

## 2022-06-17 ENCOUNTER — Inpatient Hospital Stay: Payer: Medicare Other

## 2022-06-17 VITALS — BP 122/63 | HR 80 | Resp 18

## 2022-06-17 DIAGNOSIS — G893 Neoplasm related pain (acute) (chronic): Secondary | ICD-10-CM | POA: Diagnosis not present

## 2022-06-17 DIAGNOSIS — R5383 Other fatigue: Secondary | ICD-10-CM | POA: Insufficient documentation

## 2022-06-17 DIAGNOSIS — I10 Essential (primary) hypertension: Secondary | ICD-10-CM | POA: Diagnosis not present

## 2022-06-17 DIAGNOSIS — Z803 Family history of malignant neoplasm of breast: Secondary | ICD-10-CM | POA: Insufficient documentation

## 2022-06-17 DIAGNOSIS — K449 Diaphragmatic hernia without obstruction or gangrene: Secondary | ICD-10-CM | POA: Diagnosis not present

## 2022-06-17 DIAGNOSIS — Z79899 Other long term (current) drug therapy: Secondary | ICD-10-CM | POA: Diagnosis not present

## 2022-06-17 DIAGNOSIS — Z7961 Long term (current) use of immunomodulator: Secondary | ICD-10-CM | POA: Insufficient documentation

## 2022-06-17 DIAGNOSIS — Z5112 Encounter for antineoplastic immunotherapy: Secondary | ICD-10-CM | POA: Insufficient documentation

## 2022-06-17 DIAGNOSIS — C9000 Multiple myeloma not having achieved remission: Secondary | ICD-10-CM

## 2022-06-17 DIAGNOSIS — M459 Ankylosing spondylitis of unspecified sites in spine: Secondary | ICD-10-CM | POA: Insufficient documentation

## 2022-06-17 DIAGNOSIS — Z9013 Acquired absence of bilateral breasts and nipples: Secondary | ICD-10-CM | POA: Diagnosis not present

## 2022-06-17 DIAGNOSIS — R002 Palpitations: Secondary | ICD-10-CM | POA: Diagnosis not present

## 2022-06-17 DIAGNOSIS — E559 Vitamin D deficiency, unspecified: Secondary | ICD-10-CM | POA: Diagnosis not present

## 2022-06-17 DIAGNOSIS — Z7982 Long term (current) use of aspirin: Secondary | ICD-10-CM | POA: Insufficient documentation

## 2022-06-17 DIAGNOSIS — K219 Gastro-esophageal reflux disease without esophagitis: Secondary | ICD-10-CM | POA: Diagnosis not present

## 2022-06-17 DIAGNOSIS — M129 Arthropathy, unspecified: Secondary | ICD-10-CM | POA: Insufficient documentation

## 2022-06-17 LAB — CMP (CANCER CENTER ONLY)
ALT: 17 U/L (ref 0–44)
AST: 11 U/L — ABNORMAL LOW (ref 15–41)
Albumin: 4.2 g/dL (ref 3.5–5.0)
Alkaline Phosphatase: 87 U/L (ref 38–126)
Anion gap: 6 (ref 5–15)
BUN: 15 mg/dL (ref 6–20)
CO2: 27 mmol/L (ref 22–32)
Calcium: 9.6 mg/dL (ref 8.9–10.3)
Chloride: 108 mmol/L (ref 98–111)
Creatinine: 0.71 mg/dL (ref 0.44–1.00)
GFR, Estimated: >60 mL/min (ref >60)
Glucose, Bld: 80 mg/dL (ref 70–99)
Potassium: 3.8 mmol/L (ref 3.5–5.1)
Sodium: 141 mmol/L (ref 135–145)
Total Bilirubin: 0.5 mg/dL (ref 0.3–1.2)
Total Protein: 7.2 g/dL (ref 6.5–8.1)

## 2022-06-17 LAB — CBC WITH DIFFERENTIAL (CANCER CENTER ONLY)
Abs Immature Granulocytes: 0.02 10*3/uL (ref 0.00–0.07)
Basophils Absolute: 0.1 10*3/uL (ref 0.0–0.1)
Basophils Relative: 1 %
Eosinophils Absolute: 0.3 10*3/uL (ref 0.0–0.5)
Eosinophils Relative: 4 %
HCT: 32.6 % — ABNORMAL LOW (ref 36.0–46.0)
Hemoglobin: 10.6 g/dL — ABNORMAL LOW (ref 12.0–15.0)
Immature Granulocytes: 0 %
Lymphocytes Relative: 19 %
Lymphs Abs: 1.2 10*3/uL (ref 0.7–4.0)
MCH: 29.2 pg (ref 26.0–34.0)
MCHC: 32.5 g/dL (ref 30.0–36.0)
MCV: 89.8 fL (ref 80.0–100.0)
Monocytes Absolute: 1.2 10*3/uL — ABNORMAL HIGH (ref 0.1–1.0)
Monocytes Relative: 19 %
Neutro Abs: 3.6 10*3/uL (ref 1.7–7.7)
Neutrophils Relative %: 57 %
Platelet Count: 274 10*3/uL (ref 150–400)
RBC: 3.63 MIL/uL — ABNORMAL LOW (ref 3.87–5.11)
RDW: 14.1 % (ref 11.5–15.5)
WBC Count: 6.3 10*3/uL (ref 4.0–10.5)
nRBC: 0 % (ref 0.0–0.2)

## 2022-06-17 MED ORDER — SODIUM CHLORIDE 0.9 % IV SOLN: INTRAVENOUS | Status: AC

## 2022-06-17 MED ORDER — DEXAMETHASONE 4 MG PO TABS
8.0000 mg | ORAL_TABLET | Freq: Once | ORAL | Status: AC
  Administered 2022-06-17: 8 mg via ORAL
  Filled 2022-06-17: qty 2

## 2022-06-17 MED ORDER — DEXAMETHASONE 4 MG PO TABS
12.0000 mg | ORAL_TABLET | Freq: Once | ORAL | Status: AC
  Administered 2022-06-17: 12 mg via ORAL
  Filled 2022-06-17: qty 3

## 2022-06-17 MED ORDER — BORTEZOMIB CHEMO SQ INJECTION 3.5 MG (2.5MG/ML)
1.3000 mg/m2 | Freq: Once | INTRAMUSCULAR | Status: AC
  Administered 2022-06-17: 2.5 mg via SUBCUTANEOUS
  Filled 2022-06-17: qty 1

## 2022-06-17 NOTE — Progress Notes (Signed)
The Pt informed this RN that she received 20 mg of dexamethasone PO prior to her last tx on 06/09/22. Pt had 12 mg of dexamethasone PO ordered with this tx. This RN reached out to [facility] regarding the dexamethasone orders. Murray Hodgkins PA-C added an order for 8 mg of dexamethasone PO to be given in conjunction with the 12 mg of dexamethasone already ordered to give the Pt a total of 20 mg dexamethasone PO today. Murray Hodgkins PA-C informed this RN that the Pt's future orders for dexamethasone have been updated to reflect the increased dose of 20 mg.  This RN confirmed that the dexamethasone orders were updated in the tx plan for all future appointments.This RN made the Pt aware and the Pt was agreeable and happy with the updated dexamethasone orders.

## 2022-06-17 NOTE — Patient Instructions (Signed)
[facility]  Discharge Instructions: Thank you for choosing [facility] to provide your oncology and hematology care.   If you have a lab appointment with the Cancer Center, please go directly to the Cancer Center and check in at the registration area.   Wear comfortable clothing and clothing appropriate for easy access to any Portacath or PICC line.   We strive to give you quality time with your provider. You may need to reschedule your appointment if you arrive late (15 or more minutes).  Arriving late affects you and other patients whose appointments are after yours.  Also, if you miss three or more appointments without notifying the office, you may be dismissed from the clinic at the provider's discretion.      For prescription refill requests, have your pharmacy contact our office and allow 72 hours for refills to be completed.    Today you received the following chemotherapy and/or immunotherapy agents: Velcade     To help prevent nausea and vomiting after your treatment, we encourage you to take your nausea medication as directed.  BELOW ARE SYMPTOMS THAT SHOULD BE REPORTED IMMEDIATELY: *FEVER GREATER THAN 100.4 F (38 C) OR HIGHER *CHILLS OR SWEATING *NAUSEA AND VOMITING THAT IS NOT CONTROLLED WITH YOUR NAUSEA MEDICATION *UNUSUAL SHORTNESS OF BREATH *UNUSUAL BRUISING OR BLEEDING *URINARY PROBLEMS (pain or burning when urinating, or frequent urination) *BOWEL PROBLEMS (unusual diarrhea, constipation, pain near the anus) TENDERNESS IN MOUTH AND THROAT WITH OR WITHOUT PRESENCE OF ULCERS (sore throat, sores in mouth, or a toothache) UNUSUAL RASH, SWELLING OR PAIN  UNUSUAL VAGINAL DISCHARGE OR ITCHING   Items with * indicate a potential emergency and should be followed up as soon as possible or go to the Emergency Department if any problems should occur.  Please show the CHEMOTHERAPY ALERT CARD or IMMUNOTHERAPY ALERT CARD at check-in  to the Emergency Department and triage nurse.  Should you have questions after your visit or need to cancel or reschedule your appointment, please contact [facility]  Dept: [phone]  and follow the prompts.  Office hours are 8:00 a.m. to 4:30 p.m. Monday - Friday. Please note that voicemails left after 4:00 p.m. may not be returned until the following business day.  We are closed weekends and major holidays. You have access to a nurse at all times for urgent questions. Please call the main number to the clinic Dept: [phone] and follow the prompts.   For any non-urgent questions, you may also contact your provider using MyChart. We now offer e-Visits for anyone 18 and older to request care online for non-urgent symptoms. For details visit [email or web address].   Also download the MyChart app! Go to the app store, search "MyChart", open the app, select [place], and log in with your MyChart username and password.   

## 2022-06-17 NOTE — Progress Notes (Addendum)
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  05/05/2022: Cycle 1 Day 1 of VRD therapy 05/27/2022: Cycle 2 Day 1 of VRD therapy 06/17/2022: Cycle 3 Day 1 of VRD therapy  Interval History:  Sonya Franklin 57 y.o. female with medical history significant for recently diagnosed multiple myeloma with plasmacytoma who presents for a follow up visit. The patient's last visit was on 06/09/2022. She presents today to start Cycle 3, Day 1 of VRD therapy.   On exam today Sonya Franklin reports she still feels jittery and has palpitations. She was recently found to have low TSH levels and is waiting for an endocrine consultation and plans to undergo thyroid US next week.   She still has low back pain that waxes and wanes. She is applying the lidocaine patch and tiger balm with some relief. She is hesitant to try tramadol. Her energy and appetite are stable. She denies nausea, vomiting or abdominal pain. Her bowel habits are unchanged without recurrent episodes of diarrhea or constipation.  Overall she is willing and able to proceed with treatment at this time.. She denies fevers, chills, sweats, shortness of breath, chest pain or cough. She has no other complaints. Rest of the 10 point ROS is below.   MEDICAL HISTORY:  Past Medical History:  Diagnosis Date   Ankylosing spondylitis ([place])    Anxiety    Arthritis    Breast cancer ([place])    right breast   Bursitis of hip, right    Cardiac arrhythmia    Carotid stenosis 08/26/2021   GE reflux    Hiatal hernia    Hypertension    Hyperthyroidism    Ischemic bowel disease ([place])     Multiple myeloma not having achieved remission ([place])    Prediabetes 08/26/2021   Snoring 03/21/2014   Vitamin D deficiency     SURGICAL HISTORY: Past Surgical History:  Procedure Laterality Date   ABDOMINAL HYSTERECTOMY     BREAST LUMPECTOMY     right   MASTECTOMY     double    SOCIAL HISTORY: Social History   Socioeconomic History   Marital status: Divorced    Spouse name: Not on file   Number of children: 1   Years of education: Not on file   Highest education level: Not on file  Occupational History    Employer: UNEMPLOYED  Tobacco Use   Smoking status: Never   Smokeless tobacco: Never  Vaping Use   Vaping Use: Never used  Substance and Sexual Activity   Alcohol use: No   Drug use: No   Sexual activity: Yes  Other Topics Concern   Not on file  Social History Narrative   Caffeine occasional drinker.   Not working/disabled.  12th grader. Divorced, one kids.       Social history: She receives disability benefits for history of breast cancer 2007 and ankylosing spondylitis.  She resides with special needs son.  She is divorced.  Non-smoker.  No alcohol consumption.       Family history: Father with history of prostate cancer.  Patient's mother was diagnosed with breast cancer at age 92.  Maternal aunt with history of breast cancer.  3 brothers.   Right handed   Caffeine occas   One level stays on this level       Social Determinants of Health   Financial Resource Strain: Low Risk  (08/26/2021)   Overall Financial Resource Strain (CARDIA)    Difficulty of Paying Living Expenses: Not hard at all  Food Insecurity: No Food Insecurity (05/25/2022)   Hunger Vital Sign    Worried About Running Out of Food in the Last Year: Never true    Ran Out of Food in the Last Year: Never true  Transportation Needs: No Transportation Needs (08/26/2021)   PRAPARE - Hydrologist (Medical): No    Lack of Transportation (Non-Medical): No  Physical  Activity: Inactive (08/26/2021)   Exercise Vital Sign    Days of Exercise per Week: 0 days    Minutes of Exercise per Session: 0 min  Stress: Not on file  Social Connections: Not on file  Intimate Partner Violence: Not At Risk (04/21/2022)   Humiliation, Afraid, Rape, and Kick questionnaire    Fear of Current or Ex-Partner: No    Emotionally Abused: No    Physically Abused: No    Sexually Abused: No    FAMILY HISTORY: Family History  Problem Relation Age of Onset   Diabetes Mother    Hypertension Mother    Hyperlipidemia Mother    High blood pressure Father    High Cholesterol Father    Colitis Brother    Goiter Maternal Grandmother    Breast cancer Maternal Aunt     ALLERGIES:  is allergic to shellfish allergy, blue dyes (parenteral), and hydrochlorothiazide.  MEDICATIONS:  Current Outpatient Medications  Medication Sig Dispense Refill   amLODipine (NORVASC) 5 MG tablet TAKE 1 TABLET (5 MG TOTAL) BY MOUTH DAILY. 90 tablet 2   aspirin EC 81 MG tablet Take 81 mg by mouth daily. Swallow whole.     hyoscyamine (ANASPAZ) 0.125 MG TBDP disintergrating tablet Place 1 tablet (0.125 mg total) under the tongue every 4 (four) hours as needed for cramping. 30 tablet 0   lenalidomide (REVLIMID) 25 MG capsule Take 1 capsule (25 mg total) by mouth daily. Take for 14 days on, then 7 days off. Repeat every 21 days. 14 capsule 0   lidocaine (LIDODERM) 5 % Place 1 patch onto the skin daily. Remove & Discard patch within 12 hours or as directed by MD 30 patch 0   metoprolol tartrate (LOPRESSOR) 25 MG tablet One half  tab up to twice daily if needed for palpitations 30 tablet 0   olopatadine (PATANOL) 0.1 % ophthalmic solution Place 1 drop into both eyes 2 (two) times daily as needed. 5 mL PRN   pantoprazole (PROTONIX) 40 MG tablet TAKE 1 TABLET BY MOUTH EVERY DAY 90 tablet 3   potassium chloride SA (KLOR-CON M) 20 MEQ tablet Take 2 tablets (40 mEq total) by mouth daily. 60 tablet 0   traMADol  (ULTRAM) 50 MG tablet Take 1 tablet (50 mg total) by mouth every 8 (eight) hours as needed for moderate pain. 30 tablet 0   triamcinolone cream (KENALOG) 0.1 % Apply 1 Application topically 3 (three) times daily. 30 g 1   acyclovir (ZOVIRAX) 400 MG tablet Take 1 tablet (400 mg total) by mouth 2 (two) times daily. (Patient not taking: Reported on 06/09/2022) 60 tablet 3   furosemide (LASIX) 20 MG tablet Take 1 tablet (20 mg total) by mouth daily as needed. 10 tablet 1  No current facility-administered medications for this visit.   Facility-Administered Medications Ordered in Other Visits  Medication Dose Route Frequency Provider Last Rate Last Admin   bortezomib SQ (VELCADE) chemo injection (2.5mg /mL concentration) 2.5 mg  1.3 mg/m2 (Treatment Plan Recorded) Subcutaneous Once Orson Slick, MD       dexamethasone (DECADRON) tablet 12 mg  12 mg Oral Once Orson Slick, MD        REVIEW OF SYSTEMS:   Constitutional: ( - ) fevers, ( - )  chills , ( - ) night sweats Eyes: ( - ) blurriness of vision, ( - ) double vision, ( - ) watery eyes Ears, nose, mouth, throat, and face: ( - ) mucositis, ( - ) sore throat Respiratory: ( - ) cough, ( - ) dyspnea, ( - ) wheezes Cardiovascular: ( +) palpitation, ( - ) chest discomfort, ( - ) lower extremity swelling Gastrointestinal:  ( - ) nausea, ( - ) heartburn, ( - ) change in bowel habits Skin: ( - ) abnormal skin rashes Lymphatics: ( - ) new lymphadenopathy, ( - ) easy bruising Neurological: ( - ) numbness, ( - ) tingling, ( - ) new weaknesses Behavioral/Psych: ( - ) mood change, ( - ) new changes  All other systems were reviewed with the patient and are negative.  PHYSICAL EXAMINATION: ECOG PERFORMANCE STATUS: 1 - Symptomatic but completely ambulatory  Vitals:   06/17/22 0835  BP: 123/69  Pulse: 70  Resp: 18  Temp: (!) 97.5 F (36.4 C)  SpO2: 100%     Filed Weights   06/17/22 0835  Weight: 178 lb 1.6 oz (80.8 kg)     GENERAL:  Well-appearing middle-aged African-American female, alert, no distress and comfortable SKIN: skin color, texture, turgor are normal, no rashes or significant lesions EYES: conjunctiva are pink and non-injected, sclera clear LUNGS: clear to auscultation and percussion with normal breathing effort HEART: regular rate & rhythm and no murmurs and no lower extremity edema Musculoskeletal: no cyanosis of digits and no clubbing  PSYCH: alert & oriented x 3, fluent speech NEURO: no focal motor/sensory deficits  LABORATORY DATA:  I have reviewed the data as listed    Latest Ref Rng & Units 06/17/2022    8:54 AM 06/09/2022   11:40 AM 06/02/2022    1:19 PM  CBC  WBC 4.0 - 10.5 K/uL 6.3  6.3  5.5   Hemoglobin 12.0 - 15.0 g/dL 10.6  11.1  10.4   Hematocrit 36.0 - 46.0 % 32.6  33.7  32.6   Platelets 150 - 400 K/uL 274  229  249        Latest Ref Rng & Units 06/17/2022    8:54 AM 06/09/2022   11:40 AM 06/02/2022    1:19 PM  CMP  Glucose 70 - 99 mg/dL 80  81  89   BUN 6 - 20 mg/dL 15  16  7    Creatinine 0.44 - 1.00 mg/dL 0.71  0.75  0.62   Sodium 135 - 145 mmol/L 141  143  141   Potassium 3.5 - 5.1 mmol/L 3.8  3.2  3.6   Chloride 98 - 111 mmol/L 108  107  107   CO2 22 - 32 mmol/L 27  28  27    Calcium 8.9 - 10.3 mg/dL 9.6  9.3  9.0   Total Protein 6.5 - 8.1 g/dL 7.2  7.0  6.8   Total Bilirubin 0.3 - 1.2 mg/dL 0.5  0.5  0.4   Alkaline Phos 38 - 126 U/L 87  91  97   AST 15 - 41 U/L 11  13  13    ALT 0 - 44 U/L 17  23  21      Lab Results  Component Value Date   MPROTEIN Not Observed 05/27/2022   MPROTEIN Not Observed 05/12/2022   Lab Results  Component Value Date   KPAFRELGTCHN 24.2 (H) 05/27/2022   KPAFRELGTCHN 228.6 (H) 05/12/2022   KPAFRELGTCHN 1,263.0 (H) 03/03/2022   LAMBDASER 17.9 05/27/2022   LAMBDASER 12.4 05/12/2022   LAMBDASER 6.2 03/03/2022   KAPLAMBRATIO 1.35 05/27/2022   KAPLAMBRATIO 18.44 (H) 05/12/2022   KAPLAMBRATIO 20.45 (H) 03/31/2022     RADIOGRAPHIC STUDIES: No  results found.  ASSESSMENT & PLAN Sonya Franklin 57 y.o. female with medical history significant for newly diagnosed multiple myeloma with plasmacytoma who presents for a follow up visit.  # Multiple Myeloma with Plasmactyoma --Confirmed with L5 bone lesion biopsy and bone marrow biopsy. --Started VRD therapy on 05/05/2022.  PLAN: --Due for Cycle 3 Day 1 of VRD therapy today.  --Labs today reviewed and adequate for treatment.  Labs show white blood cell count 6.3, hemoglobin 10.6, MCV 89.8, and platelets of 274. Creatinine normal, calcium normal.  --Last myeloma labs from 05/27/2022 showed significant improvement with kappa free light chain decreased from 228.6 to 24.2, ratio improved from 18.44 to 1.35 --Continue with weekly treatment and q 2 week office visits  #Jitteriness/Palpitations: --Likely secondary to abnormal thyroid function, symptoms better controlled with thyroid management. --PO dexamethasone  was decreased to 12 mg [id number] as it was uncertain if symptoms were secondary to medication. We have increased dexamethasone back to 20 mg with treatment starting 06/09/2022.  #Right lower extremity neuropathy: --Greatly improved --Monitor for now.   #Cancer related pain: --Secondary to focal tumor replacement of the right-side of the L5 vertebral body extending into the right L5 pedicle and posterior elements with a small extraosseous component extending into the right L5 neural foramen with moderate-severe stenosi -- Unable to tolerate opoid medications -- Mild improvement with lidocaine patch with tiger balm -- Made referral to our palliative care service. -- Discussed palliative radiation to help relieve pain if systemic therapy is not alleviating pain.  #Supportive Care -- chemotherapy education completed -- port placement not required.  -- zofran 8mg  q8H PRN and compazine 10mg  PO q6H for nausea -- acyclovir 400mg  PO BID for VCZ prophylaxis -- allopurinol 300mg  PO daily for  TLS prophylaxis -- Pain medication as noted above  No orders of the defined types were placed in this encounter.  All questions were answered. The patient knows to call the clinic with any problems, questions or concerns.  A total of more than 30 minutes were spent on this encounter with face-to-face time and non-face-to-face time, including preparing to see the patient, ordering tests and/or medications, counseling the patient and coordination of care as outlined above.   Dede Query PA-C Dept of Hematology and [facility] at [facility] Phone: [phone]   06/17/2022 9:51 AM

## 2022-06-17 NOTE — Addendum Note (Signed)
Addended by: Dede Query T on: 06/17/2022 10:12 AM   Modules accepted: Orders

## 2022-06-18 LAB — KAPPA/LAMBDA LIGHT CHAINS
Kappa free light chain: 18.1 mg/L (ref 3.3–19.4)
Kappa, lambda light chain ratio: 1.17 (ref 0.26–1.65)
Lambda free light chains: 15.5 mg/L (ref 5.7–26.3)

## 2022-06-19 ENCOUNTER — Other Ambulatory Visit (HOSPITAL_BASED_OUTPATIENT_CLINIC_OR_DEPARTMENT_OTHER): Payer: Self-pay | Admitting: Cardiovascular Disease

## 2022-06-20 ENCOUNTER — Other Ambulatory Visit: Payer: Self-pay | Admitting: Hematology and Oncology

## 2022-06-22 ENCOUNTER — Telehealth: Payer: Self-pay | Admitting: Internal Medicine

## 2022-06-22 ENCOUNTER — Encounter: Payer: Self-pay | Admitting: Hematology and Oncology

## 2022-06-22 LAB — MULTIPLE MYELOMA PANEL, SERUM
Albumin SerPl Elph-Mcnc: 3.6 g/dL (ref 2.9–4.4)
Albumin/Glob SerPl: 1.3 (ref 0.7–1.7)
Alpha 1: 0.2 g/dL (ref 0.0–0.4)
Alpha2 Glob SerPl Elph-Mcnc: 0.8 g/dL (ref 0.4–1.0)
B-Globulin SerPl Elph-Mcnc: 1 g/dL (ref 0.7–1.3)
Gamma Glob SerPl Elph-Mcnc: 0.8 g/dL (ref 0.4–1.8)
Globulin, Total: 2.8 g/dL (ref 2.2–3.9)
IgA: 96 mg/dL (ref 87–352)
IgG (Immunoglobin G), Serum: 837 mg/dL (ref 586–1602)
IgM (Immunoglobulin M), Srm: 40 mg/dL (ref 26–217)
Total Protein ELP: 6.4 g/dL (ref 6.0–8.5)

## 2022-06-22 MED ORDER — FUROSEMIDE 20 MG PO TABS: 20.0000 mg | ORAL_TABLET | Freq: Every day | ORAL | 1 refills | Status: DC | PRN

## 2022-06-22 NOTE — Telephone Encounter (Signed)
Sonya Franklin [phone]  [name] called to say her feet are swelling and she needs a fluid pill called into the pharmacy.

## 2022-06-22 NOTE — Telephone Encounter (Signed)
Rx sent to the pharmacy.

## 2022-06-23 NOTE — Progress Notes (Unsigned)
Palliative Medicine [facility]  Telephone:(336) [phone] Fax:(336) [phone]   Name: Sonya Franklin Aramburo Date: 06/23/2022 MRN: [id number]  DOB: 05/05/1965  Patient Care Team: Margaree MackintoshBaxley, Mary J, MD as PCP - General (Internal Medicine)    INTERVAL HISTORY: Sonya Franklin Sonya Franklin is a 57 y.o. 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 Palliative ask to see for symptom management and goals of care.   SOCIAL HISTORY:     reports that she has never smoked. She has never used smokeless tobacco. She reports that she does not drink alcohol and does not use drugs.  ADVANCE DIRECTIVES:  None on file  CODE STATUS: Full code  PAST MEDICAL HISTORY: Past Medical History:  Diagnosis Date   Ankylosing spondylitis ([facility])    Anxiety    Arthritis    Breast cancer ([facility])    right breast   Bursitis of hip, right    Cardiac arrhythmia    Carotid stenosis 08/26/2021   GE reflux    Hiatal hernia    Hypertension    Hyperthyroidism    Ischemic bowel disease ([facility])    Multiple myeloma not having achieved remission ([facility])    Prediabetes 08/26/2021   Snoring 03/21/2014   Vitamin D deficiency     ALLERGIES:  is allergic to shellfish allergy, blue dyes (parenteral), and hydrochlorothiazide.  MEDICATIONS:  Current Outpatient Medications  Medication Sig Dispense Refill   acyclovir (ZOVIRAX) 400 MG tablet Take 1 tablet (400 mg total) by mouth 2 (two) times daily. (Patient not taking: Reported on 06/09/2022) 60 tablet 3   amLODipine (NORVASC) 5 MG tablet TAKE 1 TABLET (5 MG TOTAL) BY MOUTH DAILY. 90 tablet 2   aspirin EC 81 MG tablet Take 81 mg by mouth daily. Swallow whole.     furosemide (LASIX) 20 MG tablet Take 1 tablet (20 mg total) by mouth daily as needed. 30 tablet 1   hyoscyamine (ANASPAZ) 0.125 MG TBDP disintergrating tablet Place 1 tablet (0.125 mg total) under the tongue every 4 (four)  hours as needed for cramping. 30 tablet 0   KLOR-CON M20 20 MEQ tablet TAKE 2 TABLETS BY MOUTH DAILY 180 tablet 1   lenalidomide (REVLIMID) 25 MG capsule Take 1 capsule (25 mg total) by mouth daily. Take for 14 days on, then 7 days off. Repeat every 21 days. 14 capsule 0   lidocaine (LIDODERM) 5 % Place 1 patch onto the skin daily. Remove & Discard patch within 12 hours or as directed by MD 30 patch 0   metoprolol tartrate (LOPRESSOR) 25 MG tablet ONE HALF TAB BY MOUTH UP TO TWICE DAILY IF NEEDED FOR PALPITATIONS 90 tablet 1   olopatadine (PATANOL) 0.1 % ophthalmic solution Place 1 drop into both eyes 2 (two) times daily as needed. 5 mL PRN   pantoprazole (PROTONIX) 40 MG tablet TAKE 1 TABLET BY MOUTH EVERY DAY 90 tablet 3   traMADol (ULTRAM) 50 MG tablet Take 1 tablet (50 mg total) by mouth every 8 (eight) hours as needed for moderate pain. 30 tablet 0   triamcinolone cream (KENALOG) 0.1 % Apply 1 Application topically 3 (three) times daily. 30 Franklin 1   No current facility-administered medications for this visit.    VITAL SIGNS: There were no vitals taken for this visit. There were no vitals filed for this visit.  Estimated body mass index is 33.65 kg/m as calculated from the following:   Height as  of 06/09/22: 5\' 1"  (1.549 m).   Weight as of 06/17/22: 178 lb 1.6 oz (80.8 kg).   PERFORMANCE STATUS (ECOG) : 1 - Symptomatic but completely ambulatory   Physical Exam General: NAD Cardiovascular: regular rate and rhythm Pulmonary: normal breathing pattern  Extremities: no edema, no joint deformities Skin: no rashes Neurological: AAO x4  IMPRESSION:   Pain Patient endorses ongoing arthritic and back pain.  Feels pain has worsened since recent adjustment of steroids.  Is hopeful they will increase them back up.  She has been prescribed different medications however is concerned about taking medications that increases her risk of addiction as well as potential side effects that are researched  documented.  States reluctance he to take opioids with fear that they may take her pain away causing her to want to take consistently.  I assured patient we will support her wishes while also acknowledging that of taking medications responsibly under medical supervision she could safely wean off her medications when appropriate.  She verbalized understanding.  Education provided on the use of tramadol to allow for some relief in the setting of severe pain.  He is using lidocaine patches but with minimal relief.  Also with concern that patches are coming out of.  Patient is in agreement with trying tramadol as needed for severe pain.  Otherwise we will plan to continue nonpharmacological methods to provide some comfort such as hot baths and warm compresses.    PLAN:  Lidoderm patch every 24 hours Tramadol 50 mg every 8 hours as needed for severe pain. Extensive discussion regarding nonpharmacological methods to assist with her pain and discomfort.  Education also provided on use of oral medications.  Patient verbalized understanding and knows to contact office as needed. I will plan to see patient back in 2-4 weeks in collaboration to other oncology appointments.    Patient expressed understanding and was in agreement with this plan. She also understands that She can call the clinic at any time with any questions, concerns, or complaints.   Any controlled substances utilized were prescribed in the context of palliative care. PDMP has been reviewed.   Time Total: 20 min   Visit consisted of counseling and education dealing with the complex and emotionally intense issues of symptom management and palliative care in the setting of serious and potentially life-threatening illness.Greater than 50%  of this time was spent counseling and coordinating care related to the above assessment and plan.  [name], AGPCNP-BC  Palliative Medicine Team/[facility]  *Please note that  this is a verbal dictation therefore any spelling or grammatical errors are due to the "Dragon Medical One" system interpretation.

## 2022-06-24 ENCOUNTER — Encounter: Payer: Self-pay | Admitting: Nurse Practitioner

## 2022-06-24 ENCOUNTER — Inpatient Hospital Stay (HOSPITAL_BASED_OUTPATIENT_CLINIC_OR_DEPARTMENT_OTHER): Payer: Medicare Other | Admitting: Nurse Practitioner

## 2022-06-24 ENCOUNTER — Inpatient Hospital Stay: Payer: Medicare Other

## 2022-06-24 ENCOUNTER — Ambulatory Visit
Admission: RE | Admit: 2022-06-24 | Discharge: 2022-06-24 | Disposition: A | Discharge status: Home / Self Care | Payer: Medicare Other | Source: Ambulatory Visit | Attending: Internal Medicine | Admitting: Internal Medicine

## 2022-06-24 ENCOUNTER — Other Ambulatory Visit: Payer: Self-pay

## 2022-06-24 VITALS — BP 140/76 | HR 76 | Temp 98.2°F | Resp 14 | Wt 181.3 lb

## 2022-06-24 DIAGNOSIS — M129 Arthropathy, unspecified: Secondary | ICD-10-CM | POA: Diagnosis not present

## 2022-06-24 DIAGNOSIS — K219 Gastro-esophageal reflux disease without esophagitis: Secondary | ICD-10-CM | POA: Diagnosis not present

## 2022-06-24 DIAGNOSIS — E559 Vitamin D deficiency, unspecified: Secondary | ICD-10-CM | POA: Diagnosis not present

## 2022-06-24 DIAGNOSIS — Z7982 Long term (current) use of aspirin: Secondary | ICD-10-CM | POA: Diagnosis not present

## 2022-06-24 DIAGNOSIS — Z803 Family history of malignant neoplasm of breast: Secondary | ICD-10-CM | POA: Diagnosis not present

## 2022-06-24 DIAGNOSIS — R002 Palpitations: Secondary | ICD-10-CM | POA: Diagnosis not present

## 2022-06-24 DIAGNOSIS — M459 Ankylosing spondylitis of unspecified sites in spine: Secondary | ICD-10-CM | POA: Diagnosis not present

## 2022-06-24 DIAGNOSIS — C9000 Multiple myeloma not having achieved remission: Secondary | ICD-10-CM

## 2022-06-24 DIAGNOSIS — Z515 Encounter for palliative care: Secondary | ICD-10-CM | POA: Diagnosis not present

## 2022-06-24 DIAGNOSIS — R5383 Other fatigue: Secondary | ICD-10-CM | POA: Diagnosis not present

## 2022-06-24 DIAGNOSIS — E041 Nontoxic single thyroid nodule: Secondary | ICD-10-CM | POA: Diagnosis not present

## 2022-06-24 DIAGNOSIS — I10 Essential (primary) hypertension: Secondary | ICD-10-CM | POA: Diagnosis not present

## 2022-06-24 DIAGNOSIS — E039 Hypothyroidism, unspecified: Secondary | ICD-10-CM | POA: Diagnosis not present

## 2022-06-24 DIAGNOSIS — G893 Neoplasm related pain (acute) (chronic): Secondary | ICD-10-CM | POA: Diagnosis not present

## 2022-06-24 DIAGNOSIS — K449 Diaphragmatic hernia without obstruction or gangrene: Secondary | ICD-10-CM | POA: Diagnosis not present

## 2022-06-24 DIAGNOSIS — Z79899 Other long term (current) drug therapy: Secondary | ICD-10-CM | POA: Diagnosis not present

## 2022-06-24 DIAGNOSIS — Z9013 Acquired absence of bilateral breasts and nipples: Secondary | ICD-10-CM | POA: Diagnosis not present

## 2022-06-24 DIAGNOSIS — Z5112 Encounter for antineoplastic immunotherapy: Secondary | ICD-10-CM | POA: Diagnosis not present

## 2022-06-24 DIAGNOSIS — Z7961 Long term (current) use of immunomodulator: Secondary | ICD-10-CM | POA: Diagnosis not present

## 2022-06-24 LAB — CMP (CANCER CENTER ONLY)
ALT: 20 U/L (ref 0–44)
AST: 16 U/L (ref 15–41)
Albumin: 4.2 g/dL (ref 3.5–5.0)
Alkaline Phosphatase: 86 U/L (ref 38–126)
Anion gap: 7 (ref 5–15)
BUN: 9 mg/dL (ref 6–20)
CO2: 27 mmol/L (ref 22–32)
Calcium: 9.5 mg/dL (ref 8.9–10.3)
Chloride: 108 mmol/L (ref 98–111)
Creatinine: 0.69 mg/dL (ref 0.44–1.00)
GFR, Estimated: >60 mL/min (ref >60)
Glucose, Bld: 90 mg/dL (ref 70–99)
Potassium: 4.1 mmol/L (ref 3.5–5.1)
Sodium: 142 mmol/L (ref 135–145)
Total Bilirubin: 0.4 mg/dL (ref 0.3–1.2)
Total Protein: 7 g/dL (ref 6.5–8.1)

## 2022-06-24 LAB — CBC WITH DIFFERENTIAL (CANCER CENTER ONLY)
Abs Immature Granulocytes: 0.05 10*3/uL (ref 0.00–0.07)
Basophils Absolute: 0 10*3/uL (ref 0.0–0.1)
Basophils Relative: 1 %
Eosinophils Absolute: 0.4 10*3/uL (ref 0.0–0.5)
Eosinophils Relative: 7 %
HCT: 33 % — ABNORMAL LOW (ref 36.0–46.0)
Hemoglobin: 10.6 g/dL — ABNORMAL LOW (ref 12.0–15.0)
Immature Granulocytes: 1 %
Lymphocytes Relative: 20 %
Lymphs Abs: 1.2 10*3/uL (ref 0.7–4.0)
MCH: 28.7 pg (ref 26.0–34.0)
MCHC: 32.1 g/dL (ref 30.0–36.0)
MCV: 89.4 fL (ref 80.0–100.0)
Monocytes Absolute: 0.6 10*3/uL (ref 0.1–1.0)
Monocytes Relative: 9 %
Neutro Abs: 3.8 10*3/uL (ref 1.7–7.7)
Neutrophils Relative %: 62 %
Platelet Count: 222 10*3/uL (ref 150–400)
RBC: 3.69 MIL/uL — ABNORMAL LOW (ref 3.87–5.11)
RDW: 14.2 % (ref 11.5–15.5)
WBC Count: 6 10*3/uL (ref 4.0–10.5)
nRBC: 0 % (ref 0.0–0.2)

## 2022-06-24 MED ORDER — BORTEZOMIB CHEMO SQ INJECTION 3.5 MG (2.5MG/ML)
1.3000 mg/m2 | Freq: Once | INTRAMUSCULAR | Status: AC
  Administered 2022-06-24: 2.5 mg via SUBCUTANEOUS
  Filled 2022-06-24: qty 1

## 2022-06-24 MED ORDER — SODIUM CHLORIDE 0.9 % IV SOLN: INTRAVENOUS | Status: AC

## 2022-06-24 MED ORDER — DEXAMETHASONE 4 MG PO TABS
20.0000 mg | ORAL_TABLET | Freq: Once | ORAL | Status: AC
  Administered 2022-06-24: 20 mg via ORAL
  Filled 2022-06-24: qty 5

## 2022-06-24 NOTE — Patient Instructions (Signed)
[facility]  Discharge Instructions: Thank you for choosing [facility] to provide your oncology and hematology care.   If you have a lab appointment with the Cancer Center, please go directly to the Cancer Center and check in at the registration area.   Wear comfortable clothing and clothing appropriate for easy access to any Portacath or PICC line.   We strive to give you quality time with your provider. You may need to reschedule your appointment if you arrive late (15 or more minutes).  Arriving late affects you and other patients whose appointments are after yours.  Also, if you miss three or more appointments without notifying the office, you may be dismissed from the clinic at the provider's discretion.      For prescription refill requests, have your pharmacy contact our office and allow 72 hours for refills to be completed.    Today you received the following chemotherapy and/or immunotherapy agents velcade      To help prevent nausea and vomiting after your treatment, we encourage you to take your nausea medication as directed.  BELOW ARE SYMPTOMS THAT SHOULD BE REPORTED IMMEDIATELY: *FEVER GREATER THAN 100.4 F (38 C) OR HIGHER *CHILLS OR SWEATING *NAUSEA AND VOMITING THAT IS NOT CONTROLLED WITH YOUR NAUSEA MEDICATION *UNUSUAL SHORTNESS OF BREATH *UNUSUAL BRUISING OR BLEEDING *URINARY PROBLEMS (pain or burning when urinating, or frequent urination) *BOWEL PROBLEMS (unusual diarrhea, constipation, pain near the anus) TENDERNESS IN MOUTH AND THROAT WITH OR WITHOUT PRESENCE OF ULCERS (sore throat, sores in mouth, or a toothache) UNUSUAL RASH, SWELLING OR PAIN  UNUSUAL VAGINAL DISCHARGE OR ITCHING   Items with * indicate a potential emergency and should be followed up as soon as possible or go to the Emergency Department if any problems should occur.  Please show the CHEMOTHERAPY ALERT CARD or IMMUNOTHERAPY ALERT CARD at check-in  to the Emergency Department and triage nurse.  Should you have questions after your visit or need to cancel or reschedule your appointment, please contact [facility]  Dept: [phone]  and follow the prompts.  Office hours are 8:00 a.m. to 4:30 p.m. Monday - Friday. Please note that voicemails left after 4:00 p.m. may not be returned until the following business day.  We are closed weekends and major holidays. You have access to a nurse at all times for urgent questions. Please call the main number to the clinic Dept: [phone] and follow the prompts.   For any non-urgent questions, you may also contact your provider using MyChart. We now offer e-Visits for anyone 18 and older to request care online for non-urgent symptoms. For details visit [email or web address].   Also download the MyChart app! Go to the app store, search "MyChart", open the app, select [facility], and log in with your MyChart username and password.   

## 2022-06-29 ENCOUNTER — Telehealth: Payer: Self-pay | Admitting: *Deleted

## 2022-06-29 ENCOUNTER — Telehealth: Payer: Self-pay | Admitting: Internal Medicine

## 2022-06-29 ENCOUNTER — Ambulatory Visit (INDEPENDENT_AMBULATORY_CARE_PROVIDER_SITE_OTHER): Payer: Medicare Other | Admitting: Internal Medicine

## 2022-06-29 ENCOUNTER — Encounter: Payer: Self-pay | Admitting: Internal Medicine

## 2022-06-29 VITALS — BP 124/68 | HR 66 | Temp 97.8°F | Ht 61.0 in | Wt 179.0 lb

## 2022-06-29 DIAGNOSIS — W57XXXA Bitten or stung by nonvenomous insect and other nonvenomous arthropods, initial encounter: Secondary | ICD-10-CM | POA: Diagnosis not present

## 2022-06-29 DIAGNOSIS — S00462A Insect bite (nonvenomous) of left ear, initial encounter: Secondary | ICD-10-CM

## 2022-06-29 MED ORDER — DOXYCYCLINE HYCLATE 100 MG PO TABS
100.0000 mg | ORAL_TABLET | Freq: Two times a day (BID) | ORAL | 0 refills | Status: DC
Start: 2022-06-29 — End: 2022-07-13

## 2022-06-29 NOTE — Telephone Encounter (Signed)
Sonya Franklin [phone]  Mariem called to say she has tick bite that has made her ear swell. I scheduled her for 3:30 today

## 2022-06-29 NOTE — Progress Notes (Signed)
Patient Care Team: Margaree Mackintosh, MD as PCP - General (Internal Medicine)  Visit Date: 06/29/22  Subjective:    Patient ID: Sonya Franklin , Female   DOB: [birth year], 57 y.o.    MRN: [id number]   57 y.o. Female presents today for a right ear tick bite that she noticed on 06/28/22. Notes swelling at the bite. Taking benadryl.  Past Medical History:  Diagnosis Date   Ankylosing spondylitis    Anxiety    Arthritis    Breast cancer    right breast   Bursitis of hip, right    Cardiac arrhythmia    Carotid stenosis 08/26/2021   GE reflux    Hiatal hernia    Hypertension    Hyperthyroidism    Ischemic bowel disease    Multiple myeloma not having achieved remission    Prediabetes 08/26/2021   Snoring 03/21/2014   Vitamin D deficiency      Family History  Problem Relation Age of Onset   Diabetes Mother    Hypertension Mother    Hyperlipidemia Mother    High blood pressure Father    High Cholesterol Father    Colitis Brother    Goiter Maternal Grandmother    Breast cancer Maternal Aunt     Social History   Social History Narrative   Caffeine occasional drinker.   Not working/disabled.  12th grader. Divorced, one kids.       Social history: She receives disability benefits for history of breast cancer 2007 and ankylosing spondylitis.  She resides with special needs son.  She is divorced.  Non-smoker.  No alcohol consumption.       Family history: Father with history of prostate cancer.  Patient's mother was diagnosed with breast cancer at age 71.  Maternal aunt with history of breast cancer.  3 brothers.   Right handed   Caffeine occas   One level stays on this level          Review of Systems  Constitutional:  Negative for fever and malaise/fatigue.  HENT:  Negative for congestion.   Eyes:  Negative for blurred vision.  Respiratory:  Negative for cough and shortness of breath.   Cardiovascular:  Negative for chest pain, palpitations and leg swelling.   Gastrointestinal:  Negative for vomiting.  Musculoskeletal:  Negative for back pain.  Skin:  Negative for rash.       (+) Tick bite posterior right ear  Neurological:  Negative for loss of consciousness and headaches.        Objective:   Vitals: BP 124/68   Pulse 66   Temp 97.8 F (36.6 C) (Tympanic)   Ht 5\' 1"  (1.549 m)   Wt 179 lb (81.2 kg)   SpO2 99%   BMI 33.82 kg/m    Physical Exam Vitals and nursing note reviewed.  Constitutional:      General: She is not in acute distress.    Appearance: Normal appearance. She is not toxic-appearing.  HENT:     Head: Normocephalic and atraumatic.  Pulmonary:     Effort: Pulmonary effort is normal.  Skin:    General: Skin is warm and dry.     Findings: Erythema present.     Comments: 1/8 in macular erythema posterior right ear. No drainage.  Neurological:     Mental Status: She is alert and oriented to person, place, and time. Mental status is at baseline.  Psychiatric:        Mood and  Affect: Mood normal.        Behavior: Behavior normal.        Thought Content: Thought content normal.        Judgment: Judgment normal.       Results:   Studies obtained and personally reviewed by me:    Labs:       Component Value Date/Time   NA 142 06/24/2022 0854   K 4.1 06/24/2022 0854   CL 108 06/24/2022 0854   CO2 27 06/24/2022 0854   GLUCOSE 90 06/24/2022 0854   BUN 9 06/24/2022 0854   CREATININE 0.69 06/24/2022 0854   CREATININE 0.63 10/30/2021 1238   CALCIUM 9.5 06/24/2022 0854   PROT 7.0 06/24/2022 0854   ALBUMIN 4.2 06/24/2022 0854   AST 16 06/24/2022 0854   ALT 20 06/24/2022 0854   ALKPHOS 86 06/24/2022 0854   BILITOT 0.4 06/24/2022 0854   GFRNONAA >60 06/24/2022 0854   GFRNONAA 108 04/05/2020 1128   GFRAA 126 04/05/2020 1128     Lab Results  Component Value Date   WBC 6.0 06/24/2022   HGB 10.6 (L) 06/24/2022   HCT 33.0 (L) 06/24/2022   MCV 89.4 06/24/2022   PLT 222 06/24/2022    Lab Results   Component Value Date   CHOL 181 07/17/2021   HDL 64 07/17/2021   LDLCALC 100 (H) 07/17/2021   TRIG 76 07/17/2021   CHOLHDL 2.8 07/17/2021    Lab Results  Component Value Date   HGBA1C 6.2 01/08/2022     Lab Results  Component Value Date   TSH 0.01 (L) 05/25/2022      Assessment & Plan:   Right ear tick bite: prescribed doxycycline 100 mg twice daily. Can use hydrocortisone cream for itching.    I,Alexander Ruley,acting as a Neurosurgeon for Margaree Mackintosh, MD.,have documented all relevant documentation on the behalf of Margaree Mackintosh, MD,as directed by  Margaree Mackintosh, MD while in the presence of Margaree Mackintosh, MD.   ***

## 2022-06-29 NOTE — Telephone Encounter (Signed)
Received call from pt. She states that she had a tick bite on her ear and it is swollen. She plans to call her PCP but just wanted to make sure her Revlimid would not interfere with any potential treatment. Advised that she should be fine and to continue Revlimid. Pt voiced understanding.

## 2022-07-01 ENCOUNTER — Inpatient Hospital Stay: Payer: Medicare Other | Admitting: Physician Assistant

## 2022-07-01 ENCOUNTER — Other Ambulatory Visit: Payer: Self-pay

## 2022-07-01 ENCOUNTER — Inpatient Hospital Stay: Payer: Medicare Other

## 2022-07-01 ENCOUNTER — Other Ambulatory Visit: Payer: Self-pay | Admitting: Hematology and Oncology

## 2022-07-01 ENCOUNTER — Other Ambulatory Visit: Payer: Self-pay | Admitting: *Deleted

## 2022-07-01 DIAGNOSIS — Z9013 Acquired absence of bilateral breasts and nipples: Secondary | ICD-10-CM | POA: Diagnosis not present

## 2022-07-01 DIAGNOSIS — C9000 Multiple myeloma not having achieved remission: Secondary | ICD-10-CM

## 2022-07-01 DIAGNOSIS — K449 Diaphragmatic hernia without obstruction or gangrene: Secondary | ICD-10-CM | POA: Diagnosis not present

## 2022-07-01 DIAGNOSIS — G893 Neoplasm related pain (acute) (chronic): Secondary | ICD-10-CM | POA: Diagnosis not present

## 2022-07-01 DIAGNOSIS — M129 Arthropathy, unspecified: Secondary | ICD-10-CM | POA: Diagnosis not present

## 2022-07-01 DIAGNOSIS — E559 Vitamin D deficiency, unspecified: Secondary | ICD-10-CM | POA: Diagnosis not present

## 2022-07-01 DIAGNOSIS — Z79899 Other long term (current) drug therapy: Secondary | ICD-10-CM | POA: Diagnosis not present

## 2022-07-01 DIAGNOSIS — I10 Essential (primary) hypertension: Secondary | ICD-10-CM | POA: Diagnosis not present

## 2022-07-01 DIAGNOSIS — Z7961 Long term (current) use of immunomodulator: Secondary | ICD-10-CM | POA: Diagnosis not present

## 2022-07-01 DIAGNOSIS — Z7982 Long term (current) use of aspirin: Secondary | ICD-10-CM | POA: Diagnosis not present

## 2022-07-01 DIAGNOSIS — Z803 Family history of malignant neoplasm of breast: Secondary | ICD-10-CM | POA: Diagnosis not present

## 2022-07-01 DIAGNOSIS — M459 Ankylosing spondylitis of unspecified sites in spine: Secondary | ICD-10-CM | POA: Diagnosis not present

## 2022-07-01 DIAGNOSIS — R002 Palpitations: Secondary | ICD-10-CM | POA: Diagnosis not present

## 2022-07-01 DIAGNOSIS — K219 Gastro-esophageal reflux disease without esophagitis: Secondary | ICD-10-CM | POA: Diagnosis not present

## 2022-07-01 DIAGNOSIS — Z5112 Encounter for antineoplastic immunotherapy: Secondary | ICD-10-CM | POA: Diagnosis not present

## 2022-07-01 DIAGNOSIS — R5383 Other fatigue: Secondary | ICD-10-CM | POA: Diagnosis not present

## 2022-07-01 LAB — CBC WITH DIFFERENTIAL (CANCER CENTER ONLY)
Abs Immature Granulocytes: 0.03 10*3/uL (ref 0.00–0.07)
Basophils Absolute: 0.1 10*3/uL (ref 0.0–0.1)
Basophils Relative: 1 %
Eosinophils Absolute: 0.3 10*3/uL (ref 0.0–0.5)
Eosinophils Relative: 4 %
HCT: 33 % — ABNORMAL LOW (ref 36.0–46.0)
Hemoglobin: 11 g/dL — ABNORMAL LOW (ref 12.0–15.0)
Immature Granulocytes: 0 %
Lymphocytes Relative: 17 %
Lymphs Abs: 1.3 10*3/uL (ref 0.7–4.0)
MCH: 28.9 pg (ref 26.0–34.0)
MCHC: 33.3 g/dL (ref 30.0–36.0)
MCV: 86.8 fL (ref 80.0–100.0)
Monocytes Absolute: 1.4 10*3/uL — ABNORMAL HIGH (ref 0.1–1.0)
Monocytes Relative: 19 %
Neutro Abs: 4.6 10*3/uL (ref 1.7–7.7)
Neutrophils Relative %: 59 %
Platelet Count: 229 10*3/uL (ref 150–400)
RBC: 3.8 MIL/uL — ABNORMAL LOW (ref 3.87–5.11)
RDW: 14.4 % (ref 11.5–15.5)
WBC Count: 7.7 10*3/uL (ref 4.0–10.5)
nRBC: 0 % (ref 0.0–0.2)

## 2022-07-01 LAB — CMP (CANCER CENTER ONLY)
ALT: 18 U/L (ref 0–44)
AST: 12 U/L — ABNORMAL LOW (ref 15–41)
Albumin: 4.3 g/dL (ref 3.5–5.0)
Alkaline Phosphatase: 83 U/L (ref 38–126)
Anion gap: 7 (ref 5–15)
BUN: 11 mg/dL (ref 6–20)
CO2: 28 mmol/L (ref 22–32)
Calcium: 9.8 mg/dL (ref 8.9–10.3)
Chloride: 108 mmol/L (ref 98–111)
Creatinine: 0.73 mg/dL (ref 0.44–1.00)
GFR, Estimated: >60 mL/min (ref >60)
Glucose, Bld: 83 mg/dL (ref 70–99)
Potassium: 3.4 mmol/L — ABNORMAL LOW (ref 3.5–5.1)
Sodium: 143 mmol/L (ref 135–145)
Total Bilirubin: 0.4 mg/dL (ref 0.3–1.2)
Total Protein: 7.2 g/dL (ref 6.5–8.1)

## 2022-07-01 MED ORDER — DEXAMETHASONE 4 MG PO TABS
20.0000 mg | ORAL_TABLET | Freq: Once | ORAL | Status: AC
  Administered 2022-07-01: 20 mg via ORAL
  Filled 2022-07-01: qty 5

## 2022-07-01 MED ORDER — LENALIDOMIDE 25 MG PO CAPS
25.0000 mg | ORAL_CAPSULE | Freq: Every day | ORAL | 0 refills | Status: DC
Start: 2022-07-01 — End: 2022-07-16

## 2022-07-01 MED ORDER — BORTEZOMIB CHEMO SQ INJECTION 3.5 MG (2.5MG/ML)
1.3000 mg/m2 | Freq: Once | INTRAMUSCULAR | Status: AC
  Administered 2022-07-01: 2.5 mg via SUBCUTANEOUS
  Filled 2022-07-01: qty 1

## 2022-07-01 MED ORDER — SODIUM CHLORIDE 0.9 % IV SOLN: INTRAVENOUS | Status: AC

## 2022-07-01 NOTE — Patient Instructions (Signed)
[facility]  Discharge Instructions: Thank you for choosing [facility] to provide your oncology and hematology care.   If you have a lab appointment with the Cancer Center, please go directly to the Cancer Center and check in at the registration area.   Wear comfortable clothing and clothing appropriate for easy access to any Portacath or PICC line.   We strive to give you quality time with your provider. You may need to reschedule your appointment if you arrive late (15 or more minutes).  Arriving late affects you and other patients whose appointments are after yours.  Also, if you miss three or more appointments without notifying the office, you may be dismissed from the clinic at the provider's discretion.      For prescription refill requests, have your pharmacy contact our office and allow 72 hours for refills to be completed.    Today you received the following chemotherapy and/or immunotherapy agents velcade      To help prevent nausea and vomiting after your treatment, we encourage you to take your nausea medication as directed.  BELOW ARE SYMPTOMS THAT SHOULD BE REPORTED IMMEDIATELY: *FEVER GREATER THAN 100.4 F (38 C) OR HIGHER *CHILLS OR SWEATING *NAUSEA AND VOMITING THAT IS NOT CONTROLLED WITH YOUR NAUSEA MEDICATION *UNUSUAL SHORTNESS OF BREATH *UNUSUAL BRUISING OR BLEEDING *URINARY PROBLEMS (pain or burning when urinating, or frequent urination) *BOWEL PROBLEMS (unusual diarrhea, constipation, pain near the anus) TENDERNESS IN MOUTH AND THROAT WITH OR WITHOUT PRESENCE OF ULCERS (sore throat, sores in mouth, or a toothache) UNUSUAL RASH, SWELLING OR PAIN  UNUSUAL VAGINAL DISCHARGE OR ITCHING   Items with * indicate a potential emergency and should be followed up as soon as possible or go to the Emergency Department if any problems should occur.  Please show the CHEMOTHERAPY ALERT CARD or IMMUNOTHERAPY ALERT CARD at check-in  to the Emergency Department and triage nurse.  Should you have questions after your visit or need to cancel or reschedule your appointment, please contact [facility]  Dept: [phone]  and follow the prompts.  Office hours are 8:00 a.m. to 4:30 p.m. Monday - Friday. Please note that voicemails left after 4:00 p.m. may not be returned until the following business day.  We are closed weekends and major holidays. You have access to a nurse at all times for urgent questions. Please call the main number to the clinic Dept: [phone] and follow the prompts.   For any non-urgent questions, you may also contact your provider using MyChart. We now offer e-Visits for anyone 18 and older to request care online for non-urgent symptoms. For details visit [email or web address].   Also download the MyChart app! Go to the app store, search "MyChart", open the app, select [facility], and log in with your MyChart username and password.   

## 2022-07-01 NOTE — Patient Instructions (Addendum)
Treat tick bite right ear with doxycycline 100 mg twice daily for 7 days.  Patient prefers treatment right away.  May use hydrocortisone cream topically for itching if needed.  Call if not doing well over the next few days.  Continue treatment for multiple myeloma per Dr. Leonides Schanz.  Patient reassured.  Tetanus immunization is up-to-date.

## 2022-07-02 ENCOUNTER — Encounter: Payer: Self-pay | Admitting: Hematology and Oncology

## 2022-07-02 NOTE — Progress Notes (Signed)
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  05/05/2022: Cycle 1 Day 1 of VRD therapy 05/27/2022: Cycle 2 Day 1 of VRD therapy 06/17/2022: Cycle 3 Day 1 of VRD therapy  Interval History:  Sonya Franklin 57 y.o. female with medical history significant for recently diagnosed multiple myeloma with plasmacytoma who presents for a follow up visit. The patient's last visit was on 06/17/2022. She presents today to start Cycle 3, Day 15 of VRD therapy.   On exam today Mrs. Friel reports that she was seen this Monday by her PCP due to a tick bite. She is currently on a course of doxycycline. She reports the swelling around her right earlobe has improved with the antibiotic therapy. She is otherwise feeling stable. Her energy is stable with persistent fatigue. She denies any appetite or weight changes.  She denies nausea, vomiting or abdominal pain. Her bowel habits are unchanged without recurrent episodes of diarrhea or constipation.  Her low back pain is stable and is managed with lidocaine patch. She does not want to try opoid medication.Overall she is willing and able to proceed with treatment at this time.. She denies fevers, chills, sweats, shortness of breath, chest pain or cough. She has no other complaints. Rest of the 10 point ROS is below.   MEDICAL HISTORY:  Past Medical History:  Diagnosis Date   Ankylosing spondylitis    Anxiety    Arthritis    Breast cancer    right breast   Bursitis of hip, right    Cardiac arrhythmia    Carotid stenosis 08/26/2021   GE reflux    Hiatal hernia    Hypertension     Hyperthyroidism    Ischemic bowel disease    Multiple myeloma not having achieved remission    Prediabetes 08/26/2021   Snoring 03/21/2014   Vitamin D deficiency     SURGICAL HISTORY: Past Surgical History:  Procedure Laterality Date   ABDOMINAL HYSTERECTOMY     BREAST LUMPECTOMY     right   MASTECTOMY     double    SOCIAL HISTORY: Social History   Socioeconomic History   Marital status: Divorced    Spouse name: Not on file   Number of children: 1   Years of education: Not on file   Highest education level: Not on file  Occupational History    Employer: UNEMPLOYED  Tobacco Use   Smoking status: Never   Smokeless tobacco: Never  Vaping Use   Vaping Use: Never used  Substance and Sexual Activity   Alcohol use: No   Drug use: No   Sexual activity: Yes  Other Topics Concern   Not on file  Social History Narrative   Caffeine occasional drinker.   Not working/disabled.  12th grader. Divorced, one kids.       Social history: She receives disability benefits for history of breast cancer 2007 and ankylosing spondylitis.  She resides with special needs son.  She is divorced.  Non-smoker.  No alcohol consumption.       Family history: Father with history of prostate cancer.  Patient's mother was diagnosed with breast cancer at age 28.  Maternal aunt with  history of breast cancer.  3 brothers.   Right handed   Caffeine occas   One level stays on this level       Social Determinants of Health   Financial Resource Strain: Low Risk  (08/26/2021)   Overall Financial Resource Strain (CARDIA)    Difficulty of Paying Living Expenses: Not hard at all  Food Insecurity: No Food Insecurity (05/25/2022)   Hunger Vital Sign    Worried About Running Out of Food in the Last Year: Never true    Ran Out of Food in the Last Year: Never true  Transportation Needs: No Transportation Needs (08/26/2021)   PRAPARE - Administrator, Civil Service (Medical): No    Lack of  Transportation (Non-Medical): No  Physical Activity: Inactive (08/26/2021)   Exercise Vital Sign    Days of Exercise per Week: 0 days    Minutes of Exercise per Session: 0 min  Stress: Not on file  Social Connections: Not on file  Intimate Partner Violence: Not At Risk (04/21/2022)   Humiliation, Afraid, Rape, and Kick questionnaire    Fear of Current or Ex-Partner: No    Emotionally Abused: No    Physically Abused: No    Sexually Abused: No    FAMILY HISTORY: Family History  Problem Relation Age of Onset   Diabetes Mother    Hypertension Mother    Hyperlipidemia Mother    High blood pressure Father    High Cholesterol Father    Colitis Brother    Goiter Maternal Grandmother    Breast cancer Maternal Aunt     ALLERGIES:  is allergic to shellfish allergy, blue dyes (parenteral), and hydrochlorothiazide.  MEDICATIONS:  Current Outpatient Medications  Medication Sig Dispense Refill   acyclovir (ZOVIRAX) 400 MG tablet Take 1 tablet (400 mg total) by mouth 2 (two) times daily. 60 tablet 3   amLODipine (NORVASC) 5 MG tablet TAKE 1 TABLET (5 MG TOTAL) BY MOUTH DAILY. 90 tablet 2   aspirin EC 81 MG tablet Take 81 mg by mouth daily. Swallow whole.     doxycycline (VIBRA-TABS) 100 MG tablet Take 1 tablet (100 mg total) by mouth 2 (two) times daily. 14 tablet 0   furosemide (LASIX) 20 MG tablet Take 1 tablet (20 mg total) by mouth daily as needed. 30 tablet 1   hyoscyamine (ANASPAZ) 0.125 MG TBDP disintergrating tablet Place 1 tablet (0.125 mg total) under the tongue every 4 (four) hours as needed for cramping. 30 tablet 0   KLOR-CON M20 20 MEQ tablet TAKE 2 TABLETS BY MOUTH DAILY 180 tablet 1   lenalidomide (REVLIMID) 25 MG capsule Take 1 capsule (25 mg total) by mouth daily. Take for 14 days on, then 7 days off. Repeat every 21 days. Auth # [id number] Date obtained 07/01/22 14 capsule 0   lidocaine (LIDODERM) 5 % Place 1 patch onto the skin daily. Remove & Discard patch within 12 hours or  as directed by MD 30 patch 0   metoprolol tartrate (LOPRESSOR) 25 MG tablet ONE HALF TAB BY MOUTH UP TO TWICE DAILY IF NEEDED FOR PALPITATIONS 90 tablet 1   olopatadine (PATANOL) 0.1 % ophthalmic solution Place 1 drop into both eyes 2 (two) times daily as needed. 5 mL PRN   pantoprazole (PROTONIX) 40 MG tablet TAKE 1 TABLET BY MOUTH EVERY DAY 90 tablet 3   traMADol (ULTRAM) 50 MG tablet Take 1 tablet (50 mg total) by mouth every 8 (eight) hours as needed for moderate pain.  30 tablet 0   triamcinolone cream (KENALOG) 0.1 % Apply 1 Application topically 3 (three) times daily. 30 g 1   No current facility-administered medications for this visit.    REVIEW OF SYSTEMS:   Constitutional: ( - ) fevers, ( - )  chills , ( - ) night sweats Eyes: ( - ) blurriness of vision, ( - ) double vision, ( - ) watery eyes Ears, nose, mouth, throat, and face: ( - ) mucositis, ( - ) sore throat Respiratory: ( - ) cough, ( - ) dyspnea, ( - ) wheezes Cardiovascular: ( +) palpitation, ( - ) chest discomfort, ( - ) lower extremity swelling Gastrointestinal:  ( - ) nausea, ( - ) heartburn, ( - ) change in bowel habits Skin: ( - ) abnormal skin rashes Lymphatics: ( - ) new lymphadenopathy, ( - ) easy bruising Neurological: ( - ) numbness, ( - ) tingling, ( - ) new weaknesses Behavioral/Psych: ( - ) mood change, ( - ) new changes  All other systems were reviewed with the patient and are negative.  PHYSICAL EXAMINATION: ECOG PERFORMANCE STATUS: 1 - Symptomatic but completely ambulatory  Vitals:   07/01/22 1321  BP: (!) 143/69  Pulse: 83  Resp: 14  Temp: (!) 97.3 F (36.3 C)  SpO2: 99%     Filed Weights   07/01/22 1321  Weight: 179 lb 9.6 oz (81.5 kg)     GENERAL: Well-appearing middle-aged African-American female, alert, no distress and comfortable SKIN: skin color, texture, turgor are normal, no rashes or significant lesions EYES: conjunctiva are pink and non-injected, sclera clear LUNGS: clear to  auscultation and percussion with normal breathing effort HEART: regular rate & rhythm and no murmurs and no lower extremity edema Musculoskeletal: no cyanosis of digits and no clubbing  PSYCH: alert & oriented x 3, fluent speech NEURO: no focal motor/sensory deficits  LABORATORY DATA:  I have reviewed the data as listed    Latest Ref Rng & Units 07/01/2022    1:04 PM 06/24/2022    8:54 AM 06/17/2022    8:54 AM  CBC  WBC 4.0 - 10.5 K/uL 7.7  6.0  6.3   Hemoglobin 12.0 - 15.0 g/dL 23.5  36.1  44.3   Hematocrit 36.0 - 46.0 % 33.0  33.0  32.6   Platelets 150 - 400 K/uL 229  222  274        Latest Ref Rng & Units 07/01/2022    1:04 PM 06/24/2022    8:54 AM 06/17/2022    8:54 AM  CMP  Glucose 70 - 99 mg/dL 83  90  80   BUN 6 - 20 mg/dL 11  9  15    Creatinine 0.44 - 1.00 mg/dL 1.54  0.08  6.76   Sodium 135 - 145 mmol/L 143  142  141   Potassium 3.5 - 5.1 mmol/L 3.4  4.1  3.8   Chloride 98 - 111 mmol/L 108  108  108   CO2 22 - 32 mmol/L 28  27  27    Calcium 8.9 - 10.3 mg/dL 9.8  9.5  9.6   Total Protein 6.5 - 8.1 g/dL 7.2  7.0  7.2   Total Bilirubin 0.3 - 1.2 mg/dL 0.4  0.4  0.5   Alkaline Phos 38 - 126 U/L 83  86  87   AST 15 - 41 U/L 12  16  11    ALT 0 - 44 U/L 18  20  17  Lab Results  Component Value Date   MPROTEIN Not Observed 06/17/2022   MPROTEIN Not Observed 05/27/2022   MPROTEIN Not Observed 05/12/2022   Lab Results  Component Value Date   KPAFRELGTCHN 18.1 06/17/2022   KPAFRELGTCHN 24.2 (H) 05/27/2022   KPAFRELGTCHN 228.6 (H) 05/12/2022   LAMBDASER 15.5 06/17/2022   LAMBDASER 17.9 05/27/2022   LAMBDASER 12.4 05/12/2022   KAPLAMBRATIO 1.17 06/17/2022   KAPLAMBRATIO 1.35 05/27/2022   KAPLAMBRATIO 18.44 (H) 05/12/2022     RADIOGRAPHIC STUDIES: US THYROID  Result Date: 06/24/2022 CLINICAL DATA:  Hypothyroid.  hypothyroidism; history of goiter EXAM: THYROID ULTRASOUND TECHNIQUE: Ultrasound examination of the thyroid gland and adjacent soft tissues was  performed. COMPARISON:  US Thyroid, 01/20/2005. US Thyroid biopsy, 02/09/2005 - RIGHT inferior nodule. CT chest, 01/12/2022. FINDINGS: Parenchymal Echotexture: Mildly heterogenous Isthmus: 0.7 cm Right lobe: 5.8 x 2.1 x 2.1 cm Left lobe: 4.9 x 1.8 x 1.3 cm _________________________________________________________ Estimated total number of nodules >/= 1 cm: 3 Number of spongiform nodules >/=  2 cm not described below (TR1): 0 Number of mixed cystic and solid nodules >/= 1.5 cm not described below (TR2): 0 _________________________________________________________ Nodule # 2: Location: RIGHT; Inferior Maximum size: 2.0 cm; Other 2 dimensions: 1.7 x 1.4 cm, previously 2.7 x 2.5 x 1.8 cm Composition: solid/almost completely solid (2) Echogenicity: hypoechoic (2) Shape: not taller-than-wide (0) Margins: ill-defined (0) Echogenic foci: macrocalcifications (1) ACR TI-RADS total points: 5. ACR TI-RADS risk category: TR4 (4-6 points). This nodule has demonstrated interval involution. It was previously biopsied in 02/09/2005. Assuming a benign pathologic diagnosis, repeat sampling and/or dedicated follow-up is not recommended. _________________________________________________________ Nodule # 3: Location: LEFT; Inferior Maximum size: 1.2 cm; Other 2 dimensions: 0.9 x 0.9 cm Composition: solid/almost completely solid (2) Echogenicity: hypoechoic (2) Shape: not taller-than-wide (0) Margins: ill-defined (0) Echogenic foci: none (0) ACR TI-RADS total points: 4. ACR TI-RADS risk category: TR4 (4-6 points). ACR TI-RADS recommendations: *Given size (>/= 1 - 1.4 cm) and appearance, a follow-up ultrasound in 1 year should be considered based on TI-RADS criteria. _________________________________________________________ Additional nodules, such as labeled # 1 (0.8 cm RIGHT inferior mixed cystic and solid, TR-2) and # 4 (1.0 cm RIGHT inferior cystic TR-1) do not meet threshold for follow-up nor biopsy per current criteria. No cervical  adenopathy or abnormal fluid collection within the imaged neck. IMPRESSION: 1. Borderline-enlarged, multinodular thyroid consistent with a goiter. 2. 1.2 cm LEFT inferior TR-4 thyroid nodule. A follow-up ultrasound in 1 year should be considered based on TI-RADS criteria. 3. Involution of now 2.0 cm RIGHT inferior thyroid nodule. This was previously biopsied in 02/09/2005. Assuming a benign pathologic diagnosis, repeat sampling and/or dedicated follow-up is not recommended. The above is in keeping with the ACR TI-RADS recommendations - J Am Coll Radiol 2017;14:587-595. Electronically Signed   By: Roanna Banning M.D.   On: 06/24/2022 16:30    ASSESSMENT & PLAN Sonya Franklin 57 y.o. female with medical history significant for newly diagnosed multiple myeloma with plasmacytoma who presents for a follow up visit.  # Multiple Myeloma with Plasmactyoma --Confirmed with L5 bone lesion biopsy and bone marrow biopsy. --Started VRD therapy on 05/05/2022.  PLAN: --Due for Cycle 3 Day 15 of VRD therapy today.  --Labs today reviewed and adequate for treatment.  Labs show white blood cell count 7.7, hemoglobin 11.0, MCV 86.9, and platelets of 229. Creatinine normal, calcium normal.  --Last myeloma labs from 06/17/2022 showed free light chains and ratio are normal now. --Continue with weekly treatment and  q 2 week office visits  #Jitteriness/Palpitations: --Likely secondary to abnormal thyroid function, symptoms better controlled with thyroid management. --PO dexamethasone  was decreased to 12 mg 05/19/2022 as it was uncertain if symptoms were secondary to medication. We have increased dexamethasone back to 20 mg with treatment starting 06/09/2022.  #Right lower extremity neuropathy: --Greatly improved --Monitor for now.   #Cancer related pain: --Secondary to focal tumor replacement of the right-side of the L5 vertebral body extending into the right L5 pedicle and posterior elements with a small extraosseous  component extending into the right L5 neural foramen with moderate-severe stenosi -- Unable to tolerate opoid medications -- Mild improvement with lidocaine patch with tiger balm -- Established care with our palliative care service. -- Discussed options for palliative radiation to help relieve pain if systemic therapy is not alleviating pain. --No changes for now.   #Supportive Care -- chemotherapy education completed -- port placement not required.  -- zofran 8mg  q8H PRN and compazine 10mg  PO q6H for nausea -- acyclovir 400mg  PO BID for VCZ prophylaxis -- allopurinol 300mg  PO daily for TLS prophylaxis -- Pain medication as noted above  No orders of the defined types were placed in this encounter.  All questions were answered. The patient knows to call the clinic with any problems, questions or concerns.  A total of more than 30 minutes were spent on this encounter with face-to-face time and non-face-to-face time, including preparing to see the patient, ordering tests and/or medications, counseling the patient and coordination of care as outlined above.   Georga Kaufmann PA-C Dept of Hematology and Oncology [facility] Phone: [phone]   07/02/2022 8:42 AM

## 2022-07-08 ENCOUNTER — Other Ambulatory Visit: Payer: Self-pay

## 2022-07-08 ENCOUNTER — Inpatient Hospital Stay: Payer: Medicare Other

## 2022-07-08 ENCOUNTER — Inpatient Hospital Stay: Payer: Medicare Other | Admitting: Physician Assistant

## 2022-07-08 DIAGNOSIS — E559 Vitamin D deficiency, unspecified: Secondary | ICD-10-CM | POA: Diagnosis not present

## 2022-07-08 DIAGNOSIS — Z9013 Acquired absence of bilateral breasts and nipples: Secondary | ICD-10-CM | POA: Diagnosis not present

## 2022-07-08 DIAGNOSIS — Z79899 Other long term (current) drug therapy: Secondary | ICD-10-CM | POA: Diagnosis not present

## 2022-07-08 DIAGNOSIS — Z7961 Long term (current) use of immunomodulator: Secondary | ICD-10-CM | POA: Diagnosis not present

## 2022-07-08 DIAGNOSIS — C9000 Multiple myeloma not having achieved remission: Secondary | ICD-10-CM | POA: Diagnosis not present

## 2022-07-08 DIAGNOSIS — M459 Ankylosing spondylitis of unspecified sites in spine: Secondary | ICD-10-CM | POA: Diagnosis not present

## 2022-07-08 DIAGNOSIS — I10 Essential (primary) hypertension: Secondary | ICD-10-CM | POA: Diagnosis not present

## 2022-07-08 DIAGNOSIS — R5383 Other fatigue: Secondary | ICD-10-CM | POA: Diagnosis not present

## 2022-07-08 DIAGNOSIS — Z7982 Long term (current) use of aspirin: Secondary | ICD-10-CM | POA: Diagnosis not present

## 2022-07-08 DIAGNOSIS — G893 Neoplasm related pain (acute) (chronic): Secondary | ICD-10-CM | POA: Diagnosis not present

## 2022-07-08 DIAGNOSIS — Z5112 Encounter for antineoplastic immunotherapy: Secondary | ICD-10-CM | POA: Diagnosis not present

## 2022-07-08 DIAGNOSIS — K219 Gastro-esophageal reflux disease without esophagitis: Secondary | ICD-10-CM | POA: Diagnosis not present

## 2022-07-08 DIAGNOSIS — M129 Arthropathy, unspecified: Secondary | ICD-10-CM | POA: Diagnosis not present

## 2022-07-08 DIAGNOSIS — R002 Palpitations: Secondary | ICD-10-CM | POA: Diagnosis not present

## 2022-07-08 DIAGNOSIS — Z803 Family history of malignant neoplasm of breast: Secondary | ICD-10-CM | POA: Diagnosis not present

## 2022-07-08 DIAGNOSIS — K449 Diaphragmatic hernia without obstruction or gangrene: Secondary | ICD-10-CM | POA: Diagnosis not present

## 2022-07-08 LAB — CBC WITH DIFFERENTIAL (CANCER CENTER ONLY)
Abs Immature Granulocytes: 0.02 10*3/uL (ref 0.00–0.07)
Basophils Absolute: 0 10*3/uL (ref 0.0–0.1)
Basophils Relative: 1 %
Eosinophils Absolute: 0.2 10*3/uL (ref 0.0–0.5)
Eosinophils Relative: 3 %
HCT: 34.2 % — ABNORMAL LOW (ref 36.0–46.0)
Hemoglobin: 11.1 g/dL — ABNORMAL LOW (ref 12.0–15.0)
Immature Granulocytes: 0 %
Lymphocytes Relative: 23 %
Lymphs Abs: 1.2 10*3/uL (ref 0.7–4.0)
MCH: 28.8 pg (ref 26.0–34.0)
MCHC: 32.5 g/dL (ref 30.0–36.0)
MCV: 88.6 fL (ref 80.0–100.0)
Monocytes Absolute: 1.2 10*3/uL — ABNORMAL HIGH (ref 0.1–1.0)
Monocytes Relative: 23 %
Neutro Abs: 2.6 10*3/uL (ref 1.7–7.7)
Neutrophils Relative %: 50 %
Platelet Count: 204 10*3/uL (ref 150–400)
RBC: 3.86 MIL/uL — ABNORMAL LOW (ref 3.87–5.11)
RDW: 14.5 % (ref 11.5–15.5)
WBC Count: 5.1 10*3/uL (ref 4.0–10.5)
nRBC: 0 % (ref 0.0–0.2)

## 2022-07-08 LAB — CMP (CANCER CENTER ONLY)
ALT: 16 U/L (ref 0–44)
AST: 12 U/L — ABNORMAL LOW (ref 15–41)
Albumin: 4.2 g/dL (ref 3.5–5.0)
Alkaline Phosphatase: 78 U/L (ref 38–126)
Anion gap: 8 (ref 5–15)
BUN: 13 mg/dL (ref 6–20)
CO2: 28 mmol/L (ref 22–32)
Calcium: 9.4 mg/dL (ref 8.9–10.3)
Chloride: 107 mmol/L (ref 98–111)
Creatinine: 0.76 mg/dL (ref 0.44–1.00)
GFR, Estimated: >60 mL/min (ref >60)
Glucose, Bld: 94 mg/dL (ref 70–99)
Potassium: 3.5 mmol/L (ref 3.5–5.1)
Sodium: 143 mmol/L (ref 135–145)
Total Bilirubin: 0.4 mg/dL (ref 0.3–1.2)
Total Protein: 6.9 g/dL (ref 6.5–8.1)

## 2022-07-08 MED ORDER — BORTEZOMIB CHEMO SQ INJECTION 3.5 MG (2.5MG/ML)
1.3000 mg/m2 | Freq: Once | INTRAMUSCULAR | Status: AC
  Administered 2022-07-08: 2.5 mg via SUBCUTANEOUS
  Filled 2022-07-08: qty 1

## 2022-07-08 MED ORDER — DEXAMETHASONE 4 MG PO TABS
20.0000 mg | ORAL_TABLET | Freq: Once | ORAL | Status: AC
  Administered 2022-07-08: 20 mg via ORAL
  Filled 2022-07-08: qty 5

## 2022-07-08 MED ORDER — SODIUM CHLORIDE 0.9 % IV SOLN: INTRAVENOUS | Status: AC

## 2022-07-08 NOTE — Patient Instructions (Signed)
[facility]  Discharge Instructions: Thank you for choosing [facility] to provide your oncology and hematology care.   If you have a lab appointment with the Cancer Center, please go directly to the Cancer Center and check in at the registration area.   Wear comfortable clothing and clothing appropriate for easy access to any Portacath or PICC line.   We strive to give you quality time with your provider. You may need to reschedule your appointment if you arrive late (15 or more minutes).  Arriving late affects you and other patients whose appointments are after yours.  Also, if you miss three or more appointments without notifying the office, you may be dismissed from the clinic at the provider's discretion.      For prescription refill requests, have your pharmacy contact our office and allow 72 hours for refills to be completed.    Today you received the following chemotherapy and/or immunotherapy agents velcade      To help prevent nausea and vomiting after your treatment, we encourage you to take your nausea medication as directed.  BELOW ARE SYMPTOMS THAT SHOULD BE REPORTED IMMEDIATELY: *FEVER GREATER THAN 100.4 F (38 C) OR HIGHER *CHILLS OR SWEATING *NAUSEA AND VOMITING THAT IS NOT CONTROLLED WITH YOUR NAUSEA MEDICATION *UNUSUAL SHORTNESS OF BREATH *UNUSUAL BRUISING OR BLEEDING *URINARY PROBLEMS (pain or burning when urinating, or frequent urination) *BOWEL PROBLEMS (unusual diarrhea, constipation, pain near the anus) TENDERNESS IN MOUTH AND THROAT WITH OR WITHOUT PRESENCE OF ULCERS (sore throat, sores in mouth, or a toothache) UNUSUAL RASH, SWELLING OR PAIN  UNUSUAL VAGINAL DISCHARGE OR ITCHING   Items with * indicate a potential emergency and should be followed up as soon as possible or go to the Emergency Department if any problems should occur.  Please show the CHEMOTHERAPY ALERT CARD or IMMUNOTHERAPY ALERT CARD at check-in  to the Emergency Department and triage nurse.  Should you have questions after your visit or need to cancel or reschedule your appointment, please contact [facility]  Dept: [phone]  and follow the prompts.  Office hours are 8:00 a.m. to 4:30 p.m. Monday - Friday. Please note that voicemails left after 4:00 p.m. may not be returned until the following business day.  We are closed weekends and major holidays. You have access to a nurse at all times for urgent questions. Please call the main number to the clinic Dept: [phone] and follow the prompts.   For any non-urgent questions, you may also contact your provider using MyChart. We now offer e-Visits for anyone 18 and older to request care online for non-urgent symptoms. For details visit [email or web address].   Also download the MyChart app! Go to the app store, search "MyChart", open the app, select , and log in with your MyChart username and password.   

## 2022-07-08 NOTE — Progress Notes (Unsigned)
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  05/05/2022: Cycle 1 Day 1 of VRD therapy 05/27/2022: Cycle 2 Day 1 of VRD therapy 06/17/2022: Cycle 3 Day 1 of VRD therapy 07/08/2022: Cycle 4 Day 1 of VRD therapy Interval History:  Sonya Franklin 57 y.o. female with medical history significant for recently diagnosed multiple myeloma with plasmacytoma who presents for a follow up visit. The patient's last visit was on 07/01/2022. She presents today to start Cycle 4, Day 1 of VRD therapy.   On exam today Sonya Franklin reports is nearly complete with her course of doxycycline therapy for her tick bite. The swelling in her earlobe has nearly resolved. She is otherwise stable. Her energy is unchanged with good days and bad days. She is able to complete her ADLs on her own. She denies any appetite changes. She denies nausea, vomiting or abdominal pain. Her bowel habits are unchanged without recurrent episodes of diarrhea or constipation.  Her low back pain is overall the same and rates it as 6 out 10 on a pain scale. She reports the pain is worse when she is lying down. She continues to wear a lidocaine patch for pain relief. She does not want to try opoid medication.Overall she is willing and able to proceed with treatment at this time.. She denies fevers, chills, sweats, shortness of breath, chest pain or cough. She has no other complaints. Rest of the 10 point ROS is below.   MEDICAL HISTORY:  Past Medical History:  Diagnosis Date   Ankylosing spondylitis    Anxiety    Arthritis    Breast cancer    right breast   Bursitis of hip,  right    Cardiac arrhythmia    Carotid stenosis 08/26/2021   GE reflux    Hiatal hernia    Hypertension    Hyperthyroidism    Ischemic bowel disease    Multiple myeloma not having achieved remission    Prediabetes 08/26/2021   Snoring 03/21/2014   Vitamin D deficiency     SURGICAL HISTORY: Past Surgical History:  Procedure Laterality Date   ABDOMINAL HYSTERECTOMY     BREAST LUMPECTOMY     right   MASTECTOMY     double    SOCIAL HISTORY: Social History   Socioeconomic History   Marital status: Divorced    Spouse name: Not on file   Number of children: 1   Years of education: Not on file   Highest education level: Not on file  Occupational History    Employer: UNEMPLOYED  Tobacco Use   Smoking status: Never   Smokeless tobacco: Never  Vaping Use   Vaping Use: Never used  Substance and Sexual Activity   Alcohol use: No   Drug use: No   Sexual activity: Yes  Other Topics Concern   Not on file  Social History Narrative   Caffeine occasional drinker.   Not working/disabled.  12th grader. Divorced, one kids.       Social history: She receives disability benefits for history of breast cancer 2007 and ankylosing spondylitis.  She resides with special needs son.  She is divorced.  Non-smoker.  No alcohol consumption.  Family history: Father with history of prostate cancer.  Patient's mother was diagnosed with breast cancer at age 28.  Maternal aunt with history of breast cancer.  3 brothers.   Right handed   Caffeine occas   One level stays on this level       Social Determinants of Health   Financial Resource Strain: Low Risk  (08/26/2021)   Overall Financial Resource Strain (CARDIA)    Difficulty of Paying Living Expenses: Not hard at all  Food Insecurity: No Food Insecurity (05/25/2022)   Hunger Vital Sign    Worried About Running Out of Food in the Last Year: Never true    Ran Out of Food in the Last Year: Never true  Transportation Needs: No  Transportation Needs (08/26/2021)   PRAPARE - Administrator, Civil Service (Medical): No    Lack of Transportation (Non-Medical): No  Physical Activity: Inactive (08/26/2021)   Exercise Vital Sign    Days of Exercise per Week: 0 days    Minutes of Exercise per Session: 0 min  Stress: Not on file  Social Connections: Not on file  Intimate Partner Violence: Not At Risk (04/21/2022)   Humiliation, Afraid, Rape, and Kick questionnaire    Fear of Current or Ex-Partner: No    Emotionally Abused: No    Physically Abused: No    Sexually Abused: No    FAMILY HISTORY: Family History  Problem Relation Age of Onset   Diabetes Mother    Hypertension Mother    Hyperlipidemia Mother    High blood pressure Father    High Cholesterol Father    Colitis Brother    Goiter Maternal Grandmother    Breast cancer Maternal Aunt     ALLERGIES:  is allergic to shellfish allergy, blue dyes (parenteral), and hydrochlorothiazide.  MEDICATIONS:  Current Outpatient Medications  Medication Sig Dispense Refill   acyclovir (ZOVIRAX) 400 MG tablet Take 1 tablet (400 mg total) by mouth 2 (two) times daily. 60 tablet 3   amLODipine (NORVASC) 5 MG tablet TAKE 1 TABLET (5 MG TOTAL) BY MOUTH DAILY. 90 tablet 2   aspirin EC 81 MG tablet Take 81 mg by mouth daily. Swallow whole.     doxycycline (VIBRA-TABS) 100 MG tablet Take 1 tablet (100 mg total) by mouth 2 (two) times daily. 14 tablet 0   furosemide (LASIX) 20 MG tablet Take 1 tablet (20 mg total) by mouth daily as needed. 30 tablet 1   hyoscyamine (ANASPAZ) 0.125 MG TBDP disintergrating tablet Place 1 tablet (0.125 mg total) under the tongue every 4 (four) hours as needed for cramping. 30 tablet 0   KLOR-CON M20 20 MEQ tablet TAKE 2 TABLETS BY MOUTH DAILY 180 tablet 1   lenalidomide (REVLIMID) 25 MG capsule Take 1 capsule (25 mg total) by mouth daily. Take for 14 days on, then 7 days off. Repeat every 21 days. Auth # [id number] Date obtained 07/01/22 14  capsule 0   lidocaine (LIDODERM) 5 % Place 1 patch onto the skin daily. Remove & Discard patch within 12 hours or as directed by MD 30 patch 0   metoprolol tartrate (LOPRESSOR) 25 MG tablet ONE HALF TAB BY MOUTH UP TO TWICE DAILY IF NEEDED FOR PALPITATIONS 90 tablet 1   olopatadine (PATANOL) 0.1 % ophthalmic solution Place 1 drop into both eyes 2 (two) times daily as needed. 5 mL PRN   pantoprazole (PROTONIX) 40 MG tablet TAKE 1 TABLET BY MOUTH EVERY DAY 90 tablet 3  traMADol (ULTRAM) 50 MG tablet Take 1 tablet (50 mg total) by mouth every 8 (eight) hours as needed for moderate pain. 30 tablet 0   triamcinolone cream (KENALOG) 0.1 % Apply 1 Application topically 3 (three) times daily. 30 g 1   No current facility-administered medications for this visit.    REVIEW OF SYSTEMS:   Constitutional: ( - ) fevers, ( - )  chills , ( - ) night sweats Eyes: ( - ) blurriness of vision, ( - ) double vision, ( - ) watery eyes Ears, nose, mouth, throat, and face: ( - ) mucositis, ( - ) sore throat Respiratory: ( - ) cough, ( - ) dyspnea, ( - ) wheezes Cardiovascular: ( -) palpitation, ( - ) chest discomfort, ( - ) lower extremity swelling Gastrointestinal:  ( - ) nausea, ( - ) heartburn, ( - ) change in bowel habits Skin: ( - ) abnormal skin rashes Lymphatics: ( - ) new lymphadenopathy, ( - ) easy bruising Neurological: ( - ) numbness, ( - ) tingling, ( - ) new weaknesses Behavioral/Psych: ( - ) mood change, ( - ) new changes  All other systems were reviewed with the patient and are negative.  PHYSICAL EXAMINATION: ECOG PERFORMANCE STATUS: 1 - Symptomatic but completely ambulatory  Vitals:   07/08/22 1225  BP: 129/63  Pulse: 75  Resp: 20  Temp: 97.8 F (36.6 C)  SpO2: 100%     Filed Weights   07/08/22 1225  Weight: 180 lb 14.4 oz (82.1 kg)     GENERAL: Well-appearing middle-aged African-American female, alert, no distress and comfortable SKIN: skin color, texture, turgor are normal, no  rashes or significant lesions EYES: conjunctiva are pink and non-injected, sclera clear LUNGS: clear to auscultation and percussion with normal breathing effort HEART: regular rate & rhythm and no murmurs and no lower extremity edema Musculoskeletal: no cyanosis of digits and no clubbing  PSYCH: alert & oriented x 3, fluent speech NEURO: no focal motor/sensory deficits  LABORATORY DATA:  I have reviewed the data as listed    Latest Ref Rng & Units 07/08/2022   12:59 PM 07/01/2022    1:04 PM 06/24/2022    8:54 AM  CBC  WBC 4.0 - 10.5 K/uL 5.1  7.7  6.0   Hemoglobin 12.0 - 15.0 g/dL 11.9  14.7  82.9   Hematocrit 36.0 - 46.0 % 34.2  33.0  33.0   Platelets 150 - 400 K/uL 204  229  222        Latest Ref Rng & Units 07/01/2022    1:04 PM 06/24/2022    8:54 AM 06/17/2022    8:54 AM  CMP  Glucose 70 - 99 mg/dL 83  90  80   BUN 6 - 20 mg/dL 11  9  15    Creatinine 0.44 - 1.00 mg/dL 5.62  1.30  8.65   Sodium 135 - 145 mmol/L 143  142  141   Potassium 3.5 - 5.1 mmol/L 3.4  4.1  3.8   Chloride 98 - 111 mmol/L 108  108  108   CO2 22 - 32 mmol/L 28  27  27    Calcium 8.9 - 10.3 mg/dL 9.8  9.5  9.6   Total Protein 6.5 - 8.1 g/dL 7.2  7.0  7.2   Total Bilirubin 0.3 - 1.2 mg/dL 0.4  0.4  0.5   Alkaline Phos 38 - 126 U/L 83  86  87   AST 15 - 41 U/L  12  16  11    ALT 0 - 44 U/L 18  20  17      Lab Results  Component Value Date   MPROTEIN Not Observed 06/17/2022   MPROTEIN Not Observed 05/27/2022   MPROTEIN Not Observed 05/12/2022   Lab Results  Component Value Date   KPAFRELGTCHN 18.1 06/17/2022   KPAFRELGTCHN 24.2 (H) 05/27/2022   KPAFRELGTCHN 228.6 (H) 05/12/2022   LAMBDASER 15.5 06/17/2022   LAMBDASER 17.9 05/27/2022   LAMBDASER 12.4 05/12/2022   KAPLAMBRATIO 1.17 06/17/2022   KAPLAMBRATIO 1.35 05/27/2022   KAPLAMBRATIO 18.44 (H) 05/12/2022     RADIOGRAPHIC STUDIES: US THYROID  Result Date: 06/24/2022 CLINICAL DATA:  Hypothyroid.  hypothyroidism; history of goiter EXAM:  THYROID ULTRASOUND TECHNIQUE: Ultrasound examination of the thyroid gland and adjacent soft tissues was performed. COMPARISON:  US Thyroid, 01/20/2005. US Thyroid biopsy, 02/09/2005 - RIGHT inferior nodule. CT chest, 01/12/2022. FINDINGS: Parenchymal Echotexture: Mildly heterogenous Isthmus: 0.7 cm Right lobe: 5.8 x 2.1 x 2.1 cm Left lobe: 4.9 x 1.8 x 1.3 cm _________________________________________________________ Estimated total number of nodules >/= 1 cm: 3 Number of spongiform nodules >/=  2 cm not described below (TR1): 0 Number of mixed cystic and solid nodules >/= 1.5 cm not described below (TR2): 0 _________________________________________________________ Nodule # 2: Location: RIGHT; Inferior Maximum size: 2.0 cm; Other 2 dimensions: 1.7 x 1.4 cm, previously 2.7 x 2.5 x 1.8 cm Composition: solid/almost completely solid (2) Echogenicity: hypoechoic (2) Shape: not taller-than-wide (0) Margins: ill-defined (0) Echogenic foci: macrocalcifications (1) ACR TI-RADS total points: 5. ACR TI-RADS risk category: TR4 (4-6 points). This nodule has demonstrated interval involution. It was previously biopsied in 02/09/2005. Assuming a benign pathologic diagnosis, repeat sampling and/or dedicated follow-up is not recommended. _________________________________________________________ Nodule # 3: Location: LEFT; Inferior Maximum size: 1.2 cm; Other 2 dimensions: 0.9 x 0.9 cm Composition: solid/almost completely solid (2) Echogenicity: hypoechoic (2) Shape: not taller-than-wide (0) Margins: ill-defined (0) Echogenic foci: none (0) ACR TI-RADS total points: 4. ACR TI-RADS risk category: TR4 (4-6 points). ACR TI-RADS recommendations: *Given size (>/= 1 - 1.4 cm) and appearance, a follow-up ultrasound in 1 year should be considered based on TI-RADS criteria. _________________________________________________________ Additional nodules, such as labeled # 1 (0.8 cm RIGHT inferior mixed cystic and solid, TR-2) and # 4 (1.0 cm RIGHT  inferior cystic TR-1) do not meet threshold for follow-up nor biopsy per current criteria. No cervical adenopathy or abnormal fluid collection within the imaged neck. IMPRESSION: 1. Borderline-enlarged, multinodular thyroid consistent with a goiter. 2. 1.2 cm LEFT inferior TR-4 thyroid nodule. A follow-up ultrasound in 1 year should be considered based on TI-RADS criteria. 3. Involution of now 2.0 cm RIGHT inferior thyroid nodule. This was previously biopsied in 02/09/2005. Assuming a benign pathologic diagnosis, repeat sampling and/or dedicated follow-up is not recommended. The above is in keeping with the ACR TI-RADS recommendations - J Am Coll Radiol 2017;14:587-595. Electronically Signed   By: Roanna Banning M.D.   On: 06/24/2022 16:30    ASSESSMENT & PLAN Sonya Franklin 57 y.o. female with medical history significant for newly diagnosed multiple myeloma with plasmacytoma who presents for a follow up visit.  # Multiple Myeloma with Plasmactyoma --Confirmed with L5 bone lesion biopsy and bone marrow biopsy. --Started VRD therapy on 05/05/2022.  PLAN: --Due for Cycle 4 Day 1 of VRD therapy today.  --Labs today reviewed and adequate for treatment.  Labs show white blood cell count 5.1, hemoglobin 11.1, MCV 88.6, and platelets of 204. Creatinine normal, calcium  normal.  --Last myeloma labs from 06/17/2022 showed free light chains and ratio are normal now. --We discussed role for transplant and patient is agreeable to meet with [facility] transplant team. Referral sent.  --Continue with weekly treatment and q 2 week office visits  #Jitteriness/Palpitations: --Likely secondary to abnormal thyroid function, symptoms better controlled with thyroid management. --PO dexamethasone  was decreased to 12 mg 05/19/2022 as it was uncertain if symptoms were secondary to medication. We have increased dexamethasone back to 20 mg with treatment starting 06/09/2022.  #Right lower extremity neuropathy: --Greatly  improved --Monitor for now.   #Cancer related pain: --Secondary to focal tumor replacement of the right-side of the L5 vertebral body extending into the right L5 pedicle and posterior elements with a small extraosseous component extending into the right L5 neural foramen with moderate-severe stenosi -- Unable to tolerate opoid medications -- Mild improvement with lidocaine patch with tiger balm -- Established care with our palliative care service. -- Discussed options for palliative radiation to help relieve pain if systemic therapy is not alleviating pain. --No changes for now.   #Supportive Care -- chemotherapy education completed -- port placement not required.  -- zofran 8mg  q8H PRN and compazine 10mg  PO q6H for nausea -- acyclovir 400mg  PO BID for VCZ prophylaxis -- allopurinol 300mg  PO daily for TLS prophylaxis -- Pain medication as noted above  No orders of the defined types were placed in this encounter.  All questions were answered. The patient knows to call the clinic with any problems, questions or concerns.  A total of more than 30 minutes were spent on this encounter with face-to-face time and non-face-to-face time, including preparing to see the patient, ordering tests and/or medications, counseling the patient and coordination of care as outlined above.   Georga Kaufmann PA-C Dept of Hematology and Oncology [facility] Phone: [phone]   07/08/2022 1:34 PM

## 2022-07-09 ENCOUNTER — Encounter: Payer: Self-pay | Admitting: Hematology and Oncology

## 2022-07-09 ENCOUNTER — Telehealth: Payer: Self-pay

## 2022-07-09 LAB — KAPPA/LAMBDA LIGHT CHAINS
Kappa free light chain: 15.8 mg/L (ref 3.3–19.4)
Kappa, lambda light chain ratio: 1.25 (ref 0.26–1.65)
Lambda free light chains: 12.6 mg/L (ref 5.7–26.3)

## 2022-07-09 NOTE — Telephone Encounter (Signed)
Sonya Franklin / Beth: Can you send a referral to the transplant team please.  IT  Referral faxed and confirmation received

## 2022-07-10 ENCOUNTER — Ambulatory Visit: Payer: Medicare Other | Admitting: Neurology

## 2022-07-12 ENCOUNTER — Emergency Department (HOSPITAL_COMMUNITY)
Admission: EM | Admit: 2022-07-12 | Discharge: 2022-07-12 | Disposition: A | Discharge status: Home / Self Care | Payer: Medicare Other | Attending: Emergency Medicine | Admitting: Emergency Medicine

## 2022-07-12 ENCOUNTER — Encounter (HOSPITAL_COMMUNITY): Payer: Self-pay

## 2022-07-12 DIAGNOSIS — H9202 Otalgia, left ear: Secondary | ICD-10-CM | POA: Insufficient documentation

## 2022-07-12 DIAGNOSIS — J029 Acute pharyngitis, unspecified: Secondary | ICD-10-CM | POA: Insufficient documentation

## 2022-07-12 DIAGNOSIS — E876 Hypokalemia: Secondary | ICD-10-CM | POA: Diagnosis not present

## 2022-07-12 DIAGNOSIS — Z7982 Long term (current) use of aspirin: Secondary | ICD-10-CM | POA: Diagnosis not present

## 2022-07-12 DIAGNOSIS — Z1152 Encounter for screening for COVID-19: Secondary | ICD-10-CM | POA: Insufficient documentation

## 2022-07-12 LAB — BASIC METABOLIC PANEL
Anion gap: 15 (ref 5–15)
BUN: 8 mg/dL (ref 6–20)
CO2: 23 mmol/L (ref 22–32)
Calcium: 9.3 mg/dL (ref 8.9–10.3)
Chloride: 102 mmol/L (ref 98–111)
Creatinine, Ser: 0.59 mg/dL (ref 0.44–1.00)
GFR, Estimated: >60 mL/min (ref >60)
Glucose, Bld: 101 mg/dL — ABNORMAL HIGH (ref 70–99)
Potassium: 3.3 mmol/L — ABNORMAL LOW (ref 3.5–5.1)
Sodium: 140 mmol/L (ref 135–145)

## 2022-07-12 LAB — CBC WITH DIFFERENTIAL/PLATELET
Abs Immature Granulocytes: 0.07 10*3/uL (ref 0.00–0.07)
Basophils Absolute: 0 10*3/uL (ref 0.0–0.1)
Basophils Relative: 1 %
Eosinophils Absolute: 0.1 10*3/uL (ref 0.0–0.5)
Eosinophils Relative: 1 %
HCT: 36.5 % (ref 36.0–46.0)
Hemoglobin: 11.8 g/dL — ABNORMAL LOW (ref 12.0–15.0)
Immature Granulocytes: 1 %
Lymphocytes Relative: 18 %
Lymphs Abs: 1.1 10*3/uL (ref 0.7–4.0)
MCH: 28.2 pg (ref 26.0–34.0)
MCHC: 32.3 g/dL (ref 30.0–36.0)
MCV: 87.3 fL (ref 80.0–100.0)
Monocytes Absolute: 1.8 10*3/uL — ABNORMAL HIGH (ref 0.1–1.0)
Monocytes Relative: 30 %
Neutro Abs: 3 10*3/uL (ref 1.7–7.7)
Neutrophils Relative %: 49 %
Platelets: 190 10*3/uL (ref 150–400)
RBC: 4.18 MIL/uL (ref 3.87–5.11)
RDW: 14.4 % (ref 11.5–15.5)
WBC: 6.1 10*3/uL (ref 4.0–10.5)
nRBC: 0 % (ref 0.0–0.2)

## 2022-07-12 LAB — SARS CORONAVIRUS 2 BY RT PCR: SARS Coronavirus 2 by RT PCR: NEGATIVE

## 2022-07-12 LAB — GROUP A STREP BY PCR: Group A Strep by PCR: NOT DETECTED

## 2022-07-12 MED ORDER — LIDOCAINE VISCOUS HCL 2 % MT SOLN
15.0000 mL | Freq: Once | OROMUCOSAL | Status: AC
  Administered 2022-07-12: 15 mL via OROMUCOSAL
  Filled 2022-07-12: qty 15

## 2022-07-12 MED ORDER — KETOROLAC TROMETHAMINE 15 MG/ML IJ SOLN
15.0000 mg | Freq: Once | INTRAMUSCULAR | Status: AC
  Administered 2022-07-12: 15 mg via INTRAVENOUS
  Filled 2022-07-12: qty 1

## 2022-07-12 MED ORDER — SODIUM CHLORIDE 0.9 % IV BOLUS
1000.0000 mL | Freq: Once | INTRAVENOUS | Status: AC
  Administered 2022-07-12: 1000 mL via INTRAVENOUS

## 2022-07-12 NOTE — ED Provider Notes (Signed)
[place] EMERGENCY DEPARTMENT AT [facility] Provider Note   CSN: [id number] Arrival date & time: 07/12/22  7829     History  Chief Complaint  Patient presents with   Sore Throat   Ear Pain    Sonya Franklin is a 57 y.o. female.  Patient presents to the emergency department today for evaluation of sore throat and left ear pain.  Symptoms started in the past 24 to 48 hours.  Patient is currently undergoing treatment for multiple myeloma.  Her last chemotherapy infusion was 4 days ago.  She has not had any fevers.  She is able to swallow but states that it is painful.  She is tolerating her secretions.  No cough or shortness of breath.  She is able to move her head and neck without difficulty.  She has not contacted her oncologist.  She has taken 2 doses of amoxicillin 500 mg that she had for dental procedure prophylaxis.  No known sick contacts.       Home Medications Prior to Admission medications   Medication Sig Start Date End Date Taking? Authorizing Provider  acyclovir (ZOVIRAX) 400 MG tablet Take 1 tablet (400 mg total) by mouth 2 (two) times daily. 04/30/22   Ulysees Barns IV, MD  amLODipine (NORVASC) 5 MG tablet TAKE 1 TABLET (5 MG TOTAL) BY MOUTH DAILY. 03/14/22   Margaree Mackintosh, MD  aspirin EC 81 MG tablet Take 81 mg by mouth daily. Swallow whole.    [provider]  doxycycline (VIBRA-TABS) 100 MG tablet Take 1 tablet (100 mg total) by mouth 2 (two) times daily. 06/29/22   Margaree Mackintosh, MD  furosemide (LASIX) 20 MG tablet Take 1 tablet (20 mg total) by mouth daily as needed. 06/22/22 09/20/22  Margaree Mackintosh, MD  hyoscyamine (ANASPAZ) 0.125 MG TBDP disintergrating tablet Place 1 tablet (0.125 mg total) under the tongue every 4 (four) hours as needed for cramping. 08/22/21   Margaree Mackintosh, MD  KLOR-CON M20 20 MEQ tablet TAKE 2 TABLETS BY MOUTH DAILY 06/22/22   Jaci Standard, MD  lenalidomide (REVLIMID) 25 MG capsule Take 1 capsule (25 mg total) by mouth  daily. Take for 14 days on, then 7 days off. Repeat every 21 days. Auth # [id number] Date obtained 07/01/22 07/01/22   Ulysees Barns IV, MD  lidocaine (LIDODERM) 5 % Place 1 patch onto the skin daily. Remove & Discard patch within 12 hours or as directed by MD 06/02/22   Pickenpack-Cousar, Arty Baumgartner, NP  metoprolol tartrate (LOPRESSOR) 25 MG tablet ONE HALF TAB BY MOUTH UP TO TWICE DAILY IF NEEDED FOR PALPITATIONS 06/18/22   Margaree Mackintosh, MD  olopatadine (PATANOL) 0.1 % ophthalmic solution Place 1 drop into both eyes 2 (two) times daily as needed. 12/24/21   Margaree Mackintosh, MD  pantoprazole (PROTONIX) 40 MG tablet TAKE 1 TABLET BY MOUTH EVERY DAY 12/10/21   Margaree Mackintosh, MD  traMADol (ULTRAM) 50 MG tablet Take 1 tablet (50 mg total) by mouth every 8 (eight) hours as needed for moderate pain. 06/02/22   Pickenpack-Cousar, Arty Baumgartner, NP  triamcinolone cream (KENALOG) 0.1 % Apply 1 Application topically 3 (three) times daily. 10/03/21   Margaree Mackintosh, MD      Allergies    Shellfish allergy, Blue dyes (parenteral), and Hydrochlorothiazide    Review of Systems   Review of Systems  Physical Exam Updated Vital Signs BP (!) 133/107 (BP Location: Right Arm)  Pulse 95   Temp 98.1 F (36.7 C) (Oral)   Resp 18   Ht 5\' 1"  (1.549 m)   Wt 81.6 kg   SpO2 99%   BMI 34.01 kg/m   Physical Exam Vitals and nursing note reviewed.  Constitutional:      General: She is not in acute distress.    Appearance: She is well-developed.  HENT:     Head: Normocephalic and atraumatic.     Right Ear: Tympanic membrane, ear canal and external ear normal.     Left Ear: Tympanic membrane, ear canal and external ear normal.     Nose: Congestion and rhinorrhea present.     Comments: Edema and congestion, L nare > R nare    Mouth/Throat:     Mouth: Mucous membranes are moist.     Pharynx: Posterior oropharyngeal erythema present. No oropharyngeal exudate or uvula swelling.  Eyes:     Conjunctiva/sclera: Conjunctivae  normal.  Cardiovascular:     Rate and Rhythm: Normal rate and regular rhythm.     Heart sounds: No murmur heard. Pulmonary:     Effort: No respiratory distress.     Breath sounds: No wheezing, rhonchi or rales.  Abdominal:     Palpations: Abdomen is soft.     Tenderness: There is no abdominal tenderness. There is no guarding or rebound.  Musculoskeletal:     Cervical back: Normal range of motion and neck supple.     Right lower leg: No edema.     Left lower leg: No edema.  Skin:    General: Skin is warm and dry.     Findings: No rash.  Neurological:     General: No focal deficit present.     Mental Status: She is alert. Mental status is at baseline.     Motor: No weakness.  Psychiatric:        Mood and Affect: Mood normal.     ED Results / Procedures / Treatments   Labs (all labs ordered are listed, but only abnormal results are displayed) Labs Reviewed  CBC WITH DIFFERENTIAL/PLATELET - Abnormal; Notable for the following components:      Result Value   Hemoglobin 11.8 (*)    Monocytes Absolute 1.8 (*)    All other components within normal limits  BASIC METABOLIC PANEL - Abnormal; Notable for the following components:   Potassium 3.3 (*)    Glucose, Bld 101 (*)    All other components within normal limits  GROUP A STREP BY PCR  SARS CORONAVIRUS 2 BY RT PCR    EKG None  Radiology No results found.  Procedures Procedures    Medications Ordered in ED Medications  lidocaine (XYLOCAINE) 2 % viscous mouth solution 15 mL (15 mLs Mouth/Throat Given 07/12/22 0925)  ketorolac (TORADOL) 15 MG/ML injection 15 mg (15 mg Intravenous Given 07/12/22 0925)  sodium chloride 0.9 % bolus 1,000 mL (1,000 mLs Intravenous New Bag/Given 07/12/22 1610)    ED Course/ Medical Decision Making/ A&P    Patient seen and examined. History obtained directly from patient.   Labs/EKG: Ordered CBC, BMP, strep and flu.  Imaging: None ordered  Medications/Fluids: IV Toradol, IV fluid  bolus  Most recent vital signs reviewed and are as follows: BP (!) 133/107 (BP Location: Right Arm)   Pulse 95   Temp 98.1 F (36.7 C) (Oral)   Resp 18   Ht 5\' 1"  (1.549 m)   Wt 81.6 kg   SpO2 99%   BMI 34.01  kg/m   Initial impression: Sore throat and upper respiratory infection  10:47 AM Reassessment performed. Patient appears comfortable.  She has been sipping on some water and eating some ice chips although she states that she is still "unable to swallow".  Labs personally reviewed and interpreted including: CBC with normal white blood cell count, mild anemia with hemoglobin 11.8 otherwise unremarkable; BMP mild hypokalemia 3.3 otherwise unremarkable with normal kidney function; strep and COVID-negative.  Reviewed pertinent lab work and imaging with patient at bedside. Questions answered.  Discussed that overall I feel patient is low risk for a serious complication such as deep space neck abscess, esophagitis due to thrush.  We did discuss potentially obtaining a CT of the neck, however patient declines and agrees that this is unlikely to be helpful.  She would like to continue over-the-counter medication and fluids.  Most current vital signs reviewed and are as follows: BP (!) 162/145   Pulse 90   Temp 98.1 F (36.7 C) (Oral)   Resp 17   Ht 5\' 1"  (1.549 m)   Wt 81.6 kg   SpO2 97%   BMI 34.01 kg/m   Plan: Discharge to home.   Prescriptions written for: None  Other home care instructions discussed: I encouraged the patient to drink small amounts of liquid frequently and to stick with soft diet which would be easier to swallow.  ED return instructions discussed: Inability to swallow, high fever, severe uncontrolled pain.  Follow-up instructions discussed: Patient encouraged to follow-up with their PCP in 3 days.  I also recommended that she let her oncology care team know how she is doing so they can determine if she is at risk for any complications due to her  chemotherapy.                            Medical Decision Making Amount and/or Complexity of Data Reviewed Labs: ordered.  Risk Prescription drug management.   In regards to the patient's sore throat today, the following dangerous and potentially life threatening etiologies were considered on the differential diagnosis: Lugwig's angina, uvulitis, epiglottis, peritonsillar abscess, retropharyngeal abscess, Lemierre's syndrome. Also considered were more common causes such as: streptococcal pharyngitis, gonococcal pharyngitis, non-bacterial pharyngitis (cold viruses, HSV/coxsackievirus, influenza, COVID-19, infectious mononucleosis, oropharyngeal candidiasis), and other non-infectious causes including seasonal allergies/post-nasal drip, GERD/esophagitis, trauma.   Patient has full range of motion of her neck without difficulties, no voice changes.  She is complaining about difficulty swallowing but is tolerating her secretions and is able to swallow small aliquots of water despite pain.  She does not appear to be uncomfortable.  The patient's vital signs, pertinent lab work and imaging were reviewed and interpreted as discussed in the ED course. Hospitalization was considered for further testing, treatments, or serial exams/observation. However as patient is well-appearing, has a stable exam, and reassuring studies today, I do not feel that they warrant admission at this time. This plan was discussed with the patient who verbalizes agreement and comfort with this plan and seems reliable and able to return to the Emergency Department with worsening or changing symptoms.          Final Clinical Impression(s) / ED Diagnoses Final diagnoses:  Sore throat    Rx / DC Orders ED Discharge Orders     None         Renne Crigler, PA-C 07/12/22 1051    Gloris Manchester, MD 07/12/22 1606

## 2022-07-12 NOTE — ED Triage Notes (Signed)
Pt c/o sore throat and left ear pain since yesterday. Pt is undergoing tx for multiple myeloma, last seen 4/24.

## 2022-07-12 NOTE — Discharge Instructions (Signed)
Please read and follow all provided instructions.  Your diagnoses today include:  1. Sore throat     Tests performed today include: Strep test: was negative for strep throat COVID test: Was negative Blood cell counts and electrolytes: No concerns Vital signs. See below for your results today.   Medications prescribed:  Continue Tylenol and consider using numbing lozenges for sore throat  Home care instructions:  Please read the educational materials provided and follow any instructions contained in this packet.  Please continue to maintain good hydration by drinking small amounts of water and soft foods.  Follow-up instructions: Please follow-up with your primary care provider as needed for further evaluation of your symptoms.  It would be a good idea for you to contact your oncology care team to ensure that they are not concerned about any complications related to your chemotherapy.  Return instructions:  Please return to the Emergency Department if you experience worsening symptoms.  Return if you have worsening problems swallowing, your neck becomes swollen, you cannot swallow your saliva or your voice becomes muffled.  Return with high persistent fever, persistent vomiting, or if you have trouble breathing.  Please return if you have any other emergent concerns.  Additional Information:  Your vital signs today were: BP (!) 162/145   Pulse 90   Temp 98.1 F (36.7 C) (Oral)   Resp 17   Ht 5\' 1"  (1.549 m)   Wt 81.6 kg   SpO2 97%   BMI 34.01 kg/m  If your blood pressure (BP) was elevated above 135/85 this visit, please have this repeated by your doctor within one month. --------------

## 2022-07-13 ENCOUNTER — Ambulatory Visit (INDEPENDENT_AMBULATORY_CARE_PROVIDER_SITE_OTHER): Payer: Medicare Other | Admitting: Internal Medicine

## 2022-07-13 ENCOUNTER — Telehealth: Payer: Self-pay

## 2022-07-13 ENCOUNTER — Encounter: Payer: Self-pay | Admitting: Internal Medicine

## 2022-07-13 VITALS — BP 124/67 | HR 81 | Temp 98.2°F | Ht 61.0 in | Wt 169.8 lb

## 2022-07-13 DIAGNOSIS — J029 Acute pharyngitis, unspecified: Secondary | ICD-10-CM

## 2022-07-13 DIAGNOSIS — H6502 Acute serous otitis media, left ear: Secondary | ICD-10-CM | POA: Diagnosis not present

## 2022-07-13 LAB — MULTIPLE MYELOMA PANEL, SERUM
Albumin SerPl Elph-Mcnc: 3.6 g/dL (ref 2.9–4.4)
Albumin/Glob SerPl: 1.3 (ref 0.7–1.7)
Alpha 1: 0.2 g/dL (ref 0.0–0.4)
Alpha2 Glob SerPl Elph-Mcnc: 0.8 g/dL (ref 0.4–1.0)
B-Globulin SerPl Elph-Mcnc: 1.1 g/dL (ref 0.7–1.3)
Gamma Glob SerPl Elph-Mcnc: 0.7 g/dL (ref 0.4–1.8)
Globulin, Total: 2.8 g/dL (ref 2.2–3.9)
IgA: 78 mg/dL — ABNORMAL LOW (ref 87–352)
IgG (Immunoglobin G), Serum: 766 mg/dL (ref 586–1602)
IgM (Immunoglobulin M), Srm: 28 mg/dL (ref 26–217)
Total Protein ELP: 6.4 g/dL (ref 6.0–8.5)

## 2022-07-13 MED ORDER — AZITHROMYCIN 250 MG PO TABS: ORAL_TABLET | ORAL | 0 refills | Status: AC

## 2022-07-13 MED ORDER — HYDROCODONE BIT-HOMATROP MBR 5-1.5 MG/5ML PO SOLN: 5.0000 mL | Freq: Three times a day (TID) | ORAL | 0 refills | Status: DC | PRN

## 2022-07-13 NOTE — Telephone Encounter (Signed)
Scheduled

## 2022-07-13 NOTE — Transitions of Care (Post Inpatient/ED Visit) (Signed)
   07/13/2022  Name: Sonya Franklin MRN: [id number] DOB: 1965-09-23  Today's TOC FU Call Status: Today's TOC FU Call Status:: Successful TOC FU Call Competed TOC FU Call Complete Date: 07/13/22  Transition Care Management Follow-up Telephone Call Date of Discharge: 07/12/22 Discharge Facility: [facility]) Type of Discharge: Emergency Department Reason for ED Visit: Other: How have you been since you were released from the hospital?: Worse Any questions or concerns?: No  Items Reviewed: Did you receive and understand the discharge instructions provided?: Yes Medications obtained and verified?: Yes (Medications Reviewed) Any new allergies since your discharge?: No Dietary orders reviewed?: NA Do you have support at home?: Yes People in Home: other relative(s)  Home Care and Equipment/Supplies: Were Home Health Services Ordered?: NA Any new equipment or medical supplies ordered?: NA  Functional Questionnaire: Do you need assistance with bathing/showering or dressing?: No Do you need assistance with meal preparation?: No Do you need assistance with eating?: No Do you have difficulty maintaining continence: No Do you need assistance with getting out of bed/getting out of a chair/moving?: No Do you have difficulty managing or taking your medications?: No  Follow up appointments reviewed: PCP Follow-up appointment confirmed?: Yes Date of PCP follow-up appointment?: 07/13/22 Follow-up Provider: Dr. Lenord Fellers Specialist [facility] Follow-up appointment confirmed?: NA Do you need transportation to your follow-up appointment?: No Do you understand care options if your condition(s) worsen?: Yes-patient verbalized understanding    SIGNATURE Sonya Franklin, CMA

## 2022-07-13 NOTE — Patient Instructions (Addendum)
You have been diagnosed with acute pharyngitis.  COVID test and rapid strep test were both negative in the emergency department.  Please take Zithromax Z-PAK 2 tabs day 1 followed by 1 tab days 2 through 5.  Have prescribed Hycodan 1 teaspoon every 8 hours as needed for cough and/or sore throat pain.  May take Tylenol if needed.  Call if not improved in 48 hours or sooner if worse.

## 2022-07-13 NOTE — Telephone Encounter (Signed)
Patient called stating she is sick and was seen in the ED over the weekend for ear, neck and throat pain.  She also stated the medication given is causing her difficulty to swallow and would like to be seen today I informed her a message would be sent and we will contact her once we hear from Dr. Lenord Fellers

## 2022-07-13 NOTE — Progress Notes (Addendum)
Patient Care Team: Margaree Mackintosh, MD as PCP - General (Internal Medicine)  Visit Date: 07/13/22  Subjective:    Patient ID: Sonya Franklin , Female   DOB: 1965-05-24, 57 y.o.    MRN: [id number]   57 y.o. Female presents today for a hospital/ED follow-up. Took two doses of amoxicillin on 07/10/22 and 07/11/22 that she had on-hand. Seen in ED at [facility] on 07/12/22 for sore throat and left ear pain. Treated with lidocaine, IV ketorolac, IV fluids. Currently has persistent left ear pain, sore throat. Has tinnitus in both ears. She is undergoing treatment for multiple myeloma. Last chemotherapy infusion was 07/08/22. Myeloma confirmed on L5 bone biopsy. Started therapy Feb. 2024.Referral  was sent by Oncology  for consultation regarding bone marrow transplant. Currently receiving Bortezmib with dexamethasone as scheduled by Oncology. Strep and Covid tests were negative in ED.  Past Medical History:  Diagnosis Date   Ankylosing spondylitis (HCC)    Anxiety    Arthritis    Breast cancer (HCC)    right breast   Bursitis of hip, right    Cardiac arrhythmia    Carotid stenosis 08/26/2021   GE reflux    Hiatal hernia    Hypertension    Hyperthyroidism    Ischemic bowel disease (HCC)    Multiple myeloma not having achieved remission (HCC)    Prediabetes 08/26/2021   Snoring 03/21/2014   Vitamin D deficiency      Family History  Problem Relation Age of Onset   Diabetes Mother    Hypertension Mother    Hyperlipidemia Mother    High blood pressure Father    High Cholesterol Father    Colitis Brother    Goiter Maternal Grandmother    Breast cancer Maternal Aunt     Social History   Social History Narrative   Caffeine occasional drinker.   Not working/disabled.  12th grader. Divorced, one kids.       Social history: She receives disability benefits for history of breast cancer 2007 and ankylosing spondylitis.  She resides with special needs son.  She is divorced.  Non-smoker.   No alcohol consumption.       Family history: Father with history of prostate cancer.  Patient's mother was diagnosed with breast cancer at age 34.  Maternal aunt with history of breast cancer.  3 brothers.   Right handed   Caffeine occas   One level stays on this level          Review of Systems  Constitutional:  Negative for fever and malaise/fatigue.  HENT:  Positive for ear pain (Left), sore throat and tinnitus (Bilateral). Negative for congestion.   Eyes:  Negative for blurred vision.  Respiratory:  Negative for cough and shortness of breath.   Cardiovascular:  Negative for chest pain, palpitations and leg swelling.  Gastrointestinal:  Negative for vomiting.  Musculoskeletal:  Negative for back pain.  Skin:  Negative for rash.  Neurological:  Negative for loss of consciousness and headaches.        Objective:   Vitals: BP 124/67   Pulse 81   Temp 98.2 F (36.8 C) (Tympanic)   Ht 5\' 1"  (1.549 m)   Wt 169 lb 12.8 oz (77 kg)   SpO2 99%   BMI 32.08 kg/m    Physical Exam Vitals and nursing note reviewed.  Constitutional:      General: She is not in acute distress.    Appearance: Normal appearance. She is  not toxic-appearing.  HENT:     Head: Normocephalic and atraumatic.     Right Ear: Hearing, tympanic membrane, ear canal and external ear normal.     Left Ear: Hearing, ear canal and external ear normal.     Ears:     Comments: Left ear is slightly full. Right TM clear.    Mouth/Throat:     Comments: Pharynx slightly injected. Pulmonary:     Effort: Pulmonary effort is normal. No respiratory distress.     Breath sounds: Normal breath sounds. No wheezing or rales.  Skin:    General: Skin is warm and dry.  Neurological:     Mental Status: She is alert and oriented to person, place, and time. Mental status is at baseline.  Psychiatric:        Mood and Affect: Mood normal.        Behavior: Behavior normal.        Thought Content: Thought content normal.         Judgment: Judgment normal.       Results:   Studies obtained and personally reviewed by me:   Labs:       Component Value Date/Time   NA 140 07/12/2022 0740   K 3.3 (L) 07/12/2022 0740   CL 102 07/12/2022 0740   CO2 23 07/12/2022 0740   GLUCOSE 101 (H) 07/12/2022 0740   BUN 8 07/12/2022 0740   CREATININE 0.59 07/12/2022 0740   CREATININE 0.76 07/08/2022 1259   CREATININE 0.63 10/30/2021 1238   CALCIUM 9.3 07/12/2022 0740   PROT 6.9 07/08/2022 1259   ALBUMIN 4.2 07/08/2022 1259   AST 12 (L) 07/08/2022 1259   ALT 16 07/08/2022 1259   ALKPHOS 78 07/08/2022 1259   BILITOT 0.4 07/08/2022 1259   GFRNONAA >60 07/12/2022 0740   GFRNONAA >60 07/08/2022 1259   GFRNONAA 108 04/05/2020 1128   GFRAA 126 04/05/2020 1128     Lab Results  Component Value Date   WBC 6.1 07/12/2022   HGB 11.8 (L) 07/12/2022   HCT 36.5 07/12/2022   MCV 87.3 07/12/2022   PLT 190 07/12/2022    Lab Results  Component Value Date   CHOL 181 07/17/2021   HDL 64 07/17/2021   LDLCALC 100 (H) 07/17/2021   TRIG 76 07/17/2021   CHOLHDL 2.8 07/17/2021    Lab Results  Component Value Date   HGBA1C 6.2 01/08/2022     Lab Results  Component Value Date   TSH 0.01 (L) 05/25/2022      Assessment & Plan:   Pharyngitis: prescribed Hycodan 5 mL every eight hours as needed, azithromycin Z-Pak two tabs day 1 followed by one tab days 2-5.     I,Alexander Ruley,acting as a Neurosurgeon for Margaree Mackintosh, MD.,have documented all relevant documentation on the behalf of Margaree Mackintosh, MD,as directed by  Margaree Mackintosh, MD while in the presence of Margaree Mackintosh, MD.   I, Margaree Mackintosh, MD, have reviewed all documentation for this visit. The documentation on 07/13/22 for the exam, diagnosis, procedures, and orders are all accurate and complete.

## 2022-07-15 ENCOUNTER — Other Ambulatory Visit: Payer: Self-pay

## 2022-07-15 ENCOUNTER — Inpatient Hospital Stay: Payer: Medicare Other | Attending: Physician Assistant

## 2022-07-15 ENCOUNTER — Inpatient Hospital Stay: Payer: Medicare Other

## 2022-07-15 ENCOUNTER — Telehealth: Payer: Self-pay

## 2022-07-15 VITALS — BP 122/68 | HR 78 | Temp 98.2°F | Resp 18

## 2022-07-15 DIAGNOSIS — R002 Palpitations: Secondary | ICD-10-CM | POA: Insufficient documentation

## 2022-07-15 DIAGNOSIS — Z803 Family history of malignant neoplasm of breast: Secondary | ICD-10-CM | POA: Diagnosis not present

## 2022-07-15 DIAGNOSIS — C9000 Multiple myeloma not having achieved remission: Secondary | ICD-10-CM

## 2022-07-15 DIAGNOSIS — I10 Essential (primary) hypertension: Secondary | ICD-10-CM | POA: Diagnosis not present

## 2022-07-15 DIAGNOSIS — B370 Candidal stomatitis: Secondary | ICD-10-CM | POA: Diagnosis not present

## 2022-07-15 DIAGNOSIS — Z79899 Other long term (current) drug therapy: Secondary | ICD-10-CM | POA: Insufficient documentation

## 2022-07-15 DIAGNOSIS — M129 Arthropathy, unspecified: Secondary | ICD-10-CM | POA: Insufficient documentation

## 2022-07-15 DIAGNOSIS — Z5112 Encounter for antineoplastic immunotherapy: Secondary | ICD-10-CM | POA: Insufficient documentation

## 2022-07-15 DIAGNOSIS — K449 Diaphragmatic hernia without obstruction or gangrene: Secondary | ICD-10-CM | POA: Insufficient documentation

## 2022-07-15 DIAGNOSIS — M459 Ankylosing spondylitis of unspecified sites in spine: Secondary | ICD-10-CM | POA: Insufficient documentation

## 2022-07-15 DIAGNOSIS — R933 Abnormal findings on diagnostic imaging of other parts of digestive tract: Secondary | ICD-10-CM

## 2022-07-15 DIAGNOSIS — G893 Neoplasm related pain (acute) (chronic): Secondary | ICD-10-CM | POA: Insufficient documentation

## 2022-07-15 DIAGNOSIS — Z7982 Long term (current) use of aspirin: Secondary | ICD-10-CM | POA: Insufficient documentation

## 2022-07-15 DIAGNOSIS — E039 Hypothyroidism, unspecified: Secondary | ICD-10-CM | POA: Insufficient documentation

## 2022-07-15 DIAGNOSIS — Z9011 Acquired absence of right breast and nipple: Secondary | ICD-10-CM | POA: Diagnosis not present

## 2022-07-15 DIAGNOSIS — K219 Gastro-esophageal reflux disease without esophagitis: Secondary | ICD-10-CM | POA: Insufficient documentation

## 2022-07-15 DIAGNOSIS — E559 Vitamin D deficiency, unspecified: Secondary | ICD-10-CM | POA: Insufficient documentation

## 2022-07-15 DIAGNOSIS — R5383 Other fatigue: Secondary | ICD-10-CM | POA: Insufficient documentation

## 2022-07-15 DIAGNOSIS — K869 Disease of pancreas, unspecified: Secondary | ICD-10-CM

## 2022-07-15 LAB — CMP (CANCER CENTER ONLY)
ALT: 15 U/L (ref 0–44)
AST: 10 U/L — ABNORMAL LOW (ref 15–41)
Albumin: 4.2 g/dL (ref 3.5–5.0)
Alkaline Phosphatase: 70 U/L (ref 38–126)
Anion gap: 6 (ref 5–15)
BUN: 11 mg/dL (ref 6–20)
CO2: 28 mmol/L (ref 22–32)
Calcium: 9.2 mg/dL (ref 8.9–10.3)
Chloride: 109 mmol/L (ref 98–111)
Creatinine: 0.66 mg/dL (ref 0.44–1.00)
GFR, Estimated: >60 mL/min (ref >60)
Glucose, Bld: 127 mg/dL — ABNORMAL HIGH (ref 70–99)
Potassium: 3.7 mmol/L (ref 3.5–5.1)
Sodium: 143 mmol/L (ref 135–145)
Total Bilirubin: 0.5 mg/dL (ref 0.3–1.2)
Total Protein: 6.7 g/dL (ref 6.5–8.1)

## 2022-07-15 LAB — CBC WITH DIFFERENTIAL (CANCER CENTER ONLY)
Abs Immature Granulocytes: 0.03 10*3/uL (ref 0.00–0.07)
Basophils Absolute: 0 10*3/uL (ref 0.0–0.1)
Basophils Relative: 1 %
Eosinophils Absolute: 0.5 10*3/uL (ref 0.0–0.5)
Eosinophils Relative: 9 %
HCT: 32.8 % — ABNORMAL LOW (ref 36.0–46.0)
Hemoglobin: 10.5 g/dL — ABNORMAL LOW (ref 12.0–15.0)
Immature Granulocytes: 1 %
Lymphocytes Relative: 25 %
Lymphs Abs: 1.3 10*3/uL (ref 0.7–4.0)
MCH: 28.5 pg (ref 26.0–34.0)
MCHC: 32 g/dL (ref 30.0–36.0)
MCV: 88.9 fL (ref 80.0–100.0)
Monocytes Absolute: 0.8 10*3/uL (ref 0.1–1.0)
Monocytes Relative: 15 %
Neutro Abs: 2.6 10*3/uL (ref 1.7–7.7)
Neutrophils Relative %: 49 %
Platelet Count: 192 10*3/uL (ref 150–400)
RBC: 3.69 MIL/uL — ABNORMAL LOW (ref 3.87–5.11)
RDW: 14.7 % (ref 11.5–15.5)
WBC Count: 5.3 10*3/uL (ref 4.0–10.5)
nRBC: 0 % (ref 0.0–0.2)

## 2022-07-15 MED ORDER — DEXAMETHASONE 4 MG PO TABS
20.0000 mg | ORAL_TABLET | Freq: Once | ORAL | Status: AC
  Administered 2022-07-15: 20 mg via ORAL
  Filled 2022-07-15: qty 5

## 2022-07-15 MED ORDER — SODIUM CHLORIDE 0.9 % IV SOLN: INTRAVENOUS | Status: AC

## 2022-07-15 MED ORDER — BORTEZOMIB CHEMO SQ INJECTION 3.5 MG (2.5MG/ML)
1.3000 mg/m2 | Freq: Once | INTRAMUSCULAR | Status: AC
  Administered 2022-07-15: 2.5 mg via SUBCUTANEOUS
  Filled 2022-07-15: qty 1

## 2022-07-15 NOTE — Telephone Encounter (Signed)
-----   Message from Missy Sabins, RN sent at 05/28/2022  9:21 AM EDT ----- Regarding: MRI/MRCP and labs MRI/MRCP - pancreatic lesion - need to enter order CA-19 - need to enter order

## 2022-07-15 NOTE — Telephone Encounter (Signed)
Called and spoke with patient to inform her that she is due for MRCP and lab work at this time. Patient knows to expect a call from radiology scheduling to set up this appt. Patient has been advised to stop by the lab at her convenience, no appt necessary. I provided patient with the lab location and hours. Pt verbalized understanding and had no concerns at the end of the call.  MRCP order in epic. Secure staff message sent to radiology scheduling to contact patient to set up appt.   Lab order in epic.

## 2022-07-16 ENCOUNTER — Other Ambulatory Visit: Payer: Self-pay | Admitting: *Deleted

## 2022-07-16 DIAGNOSIS — C9000 Multiple myeloma not having achieved remission: Secondary | ICD-10-CM

## 2022-07-16 MED ORDER — LENALIDOMIDE 25 MG PO CAPS
25.0000 mg | ORAL_CAPSULE | Freq: Every day | ORAL | 0 refills | Status: DC
Start: 2022-07-16 — End: 2022-07-31

## 2022-07-21 NOTE — Progress Notes (Deleted)
Palliative Medicine [facility]  Telephone:([phone] [phone]) [phone]   Name: Sonya Franklin Date: 07/21/2022 MRN: [id number]  DOB: November 25, 1965  Patient Care Team: Margaree Mackintosh, MD as PCP - General (Internal Medicine)    INTERVAL HISTORY: Sonya Franklin is a 57 y.o. 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 Palliative ask to see for symptom management and goals of care.   SOCIAL HISTORY:     reports that she has never smoked. She has never used smokeless tobacco. She reports that she does not drink alcohol and does not use drugs.  ADVANCE DIRECTIVES:  None on file  CODE STATUS: Full code  PAST MEDICAL HISTORY: Past Medical History:  Diagnosis Date   Ankylosing spondylitis (HCC)    Anxiety    Arthritis    Breast cancer (HCC)    right breast   Bursitis of hip, right    Cardiac arrhythmia    Carotid stenosis 08/26/2021   GE reflux    Hiatal hernia    Hypertension    Hyperthyroidism    Ischemic bowel disease (HCC)    Multiple myeloma not having achieved remission (HCC)    Prediabetes 08/26/2021   Snoring 03/21/2014   Vitamin D deficiency     ALLERGIES:  is allergic to shellfish allergy, blue dyes (parenteral), and hydrochlorothiazide.  MEDICATIONS:  Current Outpatient Medications  Medication Sig Dispense Refill   acyclovir (ZOVIRAX) 400 MG tablet Take 1 tablet (400 mg total) by mouth 2 (two) times daily. (Patient not taking: Reported on 07/13/2022) 60 tablet 3   amLODipine (NORVASC) 5 MG tablet TAKE 1 TABLET (5 MG TOTAL) BY MOUTH DAILY. (Patient not taking: Reported on 07/13/2022) 90 tablet 2   aspirin EC 81 MG tablet Take 81 mg by mouth daily. Swallow whole. (Patient not taking: Reported on 07/13/2022)     furosemide (LASIX) 20 MG tablet Take 1 tablet (20 mg total) by mouth daily as needed. (Patient not taking: Reported on 07/13/2022) 30  tablet 1   HYDROcodone bit-homatropine (HYCODAN) 5-1.5 MG/5ML syrup Take 5 mLs by mouth every 8 (eight) hours as needed for cough. 120 mL 0   hyoscyamine (ANASPAZ) 0.125 MG TBDP disintergrating tablet Place 1 tablet (0.125 mg total) under the tongue every 4 (four) hours as needed for cramping. (Patient not taking: Reported on 07/13/2022) 30 tablet 0   KLOR-CON M20 20 MEQ tablet TAKE 2 TABLETS BY MOUTH DAILY (Patient not taking: Reported on 07/13/2022) 180 tablet 1   lenalidomide (REVLIMID) 25 MG capsule Take 1 capsule (25 mg total) by mouth daily. Take for 14 days on, then 7 days off. Repeat every 21 days. Auth # [id number] Date obtained 07/16/22 14 capsule 0   lidocaine (LIDODERM) 5 % Place 1 patch onto the skin daily. Remove & Discard patch within 12 hours or as directed by MD (Patient not taking: Reported on 07/13/2022) 30 patch 0   metoprolol tartrate (LOPRESSOR) 25 MG tablet ONE HALF TAB BY MOUTH UP TO TWICE DAILY IF NEEDED FOR PALPITATIONS (Patient not taking: Reported on 07/13/2022) 90 tablet 1   olopatadine (PATANOL) 0.1 % ophthalmic solution Place 1 drop into both eyes 2 (two) times daily as needed. (Patient not taking: Reported on 07/13/2022) 5 mL PRN   pantoprazole (PROTONIX) 40 MG tablet TAKE 1 TABLET BY MOUTH EVERY DAY (Patient not taking: Reported on 07/13/2022) 90 tablet 3   traMADol (ULTRAM) 50 MG tablet Take  1 tablet (50 mg total) by mouth every 8 (eight) hours as needed for moderate pain. (Patient not taking: Reported on 07/13/2022) 30 tablet 0   triamcinolone cream (KENALOG) 0.1 % Apply 1 Application topically 3 (three) times daily. (Patient not taking: Reported on 07/13/2022) 30 g 1   No current facility-administered medications for this visit.    VITAL SIGNS: There were no vitals taken for this visit. There were no vitals filed for this visit.  Estimated body mass index is 32.08 kg/m as calculated from the following:   Height as of 07/13/22: 5\' 1"  (1.549 m).   Weight as of 07/13/22: 169  lb 12.8 oz (77 kg).   PERFORMANCE STATUS (ECOG) : 1 - Symptomatic but completely ambulatory   Physical Exam General: NAD Cardiovascular: regular rate and rhythm Pulmonary: normal breathing pattern  Extremities: no edema, no joint deformities Skin: no rashes Neurological: AAO x4  IMPRESSION:   Pain Patient endorses ongoing arthritic and back pain.  Feels pain has worsened since recent adjustment of steroids.  Is hopeful they will increase them back up.  She has been prescribed different medications however is concerned about taking medications that increases her risk of addiction as well as potential side effects that are researched documented.  States reluctance he to take opioids with fear that they may take her pain away causing her to want to take consistently.  I assured patient we will support her wishes while also acknowledging that of taking medications responsibly under medical supervision she could safely wean off her medications when appropriate.  She verbalized understanding.  Education provided on the use of tramadol to allow for some relief in the setting of severe pain.  He is using lidocaine patches but with minimal relief.  Also with concern that patches are coming out of.  Patient is in agreement with trying tramadol as needed for severe pain.  Otherwise we will plan to continue nonpharmacological methods to provide some comfort such as hot baths and warm compresses.    PLAN:  Lidoderm patch every 24 hours Tramadol 50 mg every 8 hours as needed for severe pain. Extensive discussion regarding nonpharmacological methods to assist with her pain and discomfort.  Education also provided on use of oral medications.  Patient verbalized understanding and knows to contact office as needed. I will plan to see patient back in 2-4 weeks in collaboration to other oncology appointments.    Patient expressed understanding and was in agreement with this plan. She also understands that  She can call the clinic at any time with any questions, concerns, or complaints.   Any controlled substances utilized were prescribed in the context of palliative care. PDMP has been reviewed.   Time Total: 20 min   Visit consisted of counseling and education dealing with the complex and emotionally intense issues of symptom management and palliative care in the setting of serious and potentially life-threatening illness.Greater than 50%  of this time was spent counseling and coordinating care related to the above assessment and plan.  [name], AGPCNP-BC  Palliative Medicine Team/[facility]  *Please note that this is a verbal dictation therefore any spelling or grammatical errors are due to the "[employer] One" system interpretation.

## 2022-07-22 ENCOUNTER — Inpatient Hospital Stay (HOSPITAL_BASED_OUTPATIENT_CLINIC_OR_DEPARTMENT_OTHER): Payer: Medicare Other | Admitting: Physician Assistant

## 2022-07-22 ENCOUNTER — Inpatient Hospital Stay: Payer: Medicare Other | Admitting: Nurse Practitioner

## 2022-07-22 ENCOUNTER — Inpatient Hospital Stay: Payer: Medicare Other

## 2022-07-22 ENCOUNTER — Other Ambulatory Visit: Payer: Self-pay

## 2022-07-22 VITALS — BP 127/60 | HR 84 | Temp 97.7°F | Resp 18

## 2022-07-22 VITALS — BP 132/63 | HR 76 | Temp 97.9°F | Resp 17 | Wt 178.4 lb

## 2022-07-22 DIAGNOSIS — Z7982 Long term (current) use of aspirin: Secondary | ICD-10-CM | POA: Diagnosis not present

## 2022-07-22 DIAGNOSIS — Z9011 Acquired absence of right breast and nipple: Secondary | ICD-10-CM | POA: Diagnosis not present

## 2022-07-22 DIAGNOSIS — C9000 Multiple myeloma not having achieved remission: Secondary | ICD-10-CM

## 2022-07-22 DIAGNOSIS — I10 Essential (primary) hypertension: Secondary | ICD-10-CM | POA: Diagnosis not present

## 2022-07-22 DIAGNOSIS — R002 Palpitations: Secondary | ICD-10-CM | POA: Diagnosis not present

## 2022-07-22 DIAGNOSIS — M459 Ankylosing spondylitis of unspecified sites in spine: Secondary | ICD-10-CM | POA: Diagnosis not present

## 2022-07-22 DIAGNOSIS — R5383 Other fatigue: Secondary | ICD-10-CM | POA: Diagnosis not present

## 2022-07-22 DIAGNOSIS — E559 Vitamin D deficiency, unspecified: Secondary | ICD-10-CM | POA: Diagnosis not present

## 2022-07-22 DIAGNOSIS — Z5112 Encounter for antineoplastic immunotherapy: Secondary | ICD-10-CM | POA: Diagnosis not present

## 2022-07-22 DIAGNOSIS — E039 Hypothyroidism, unspecified: Secondary | ICD-10-CM | POA: Diagnosis not present

## 2022-07-22 DIAGNOSIS — Z803 Family history of malignant neoplasm of breast: Secondary | ICD-10-CM | POA: Diagnosis not present

## 2022-07-22 DIAGNOSIS — M129 Arthropathy, unspecified: Secondary | ICD-10-CM | POA: Diagnosis not present

## 2022-07-22 DIAGNOSIS — K219 Gastro-esophageal reflux disease without esophagitis: Secondary | ICD-10-CM | POA: Diagnosis not present

## 2022-07-22 DIAGNOSIS — K449 Diaphragmatic hernia without obstruction or gangrene: Secondary | ICD-10-CM | POA: Diagnosis not present

## 2022-07-22 DIAGNOSIS — B370 Candidal stomatitis: Secondary | ICD-10-CM

## 2022-07-22 DIAGNOSIS — G893 Neoplasm related pain (acute) (chronic): Secondary | ICD-10-CM | POA: Diagnosis not present

## 2022-07-22 DIAGNOSIS — Z79899 Other long term (current) drug therapy: Secondary | ICD-10-CM | POA: Diagnosis not present

## 2022-07-22 LAB — CBC WITH DIFFERENTIAL (CANCER CENTER ONLY)
Abs Immature Granulocytes: 0.03 10*3/uL (ref 0.00–0.07)
Basophils Absolute: 0 10*3/uL (ref 0.0–0.1)
Basophils Relative: 1 %
Eosinophils Absolute: 0.2 10*3/uL (ref 0.0–0.5)
Eosinophils Relative: 3 %
HCT: 31.4 % — ABNORMAL LOW (ref 36.0–46.0)
Hemoglobin: 10.2 g/dL — ABNORMAL LOW (ref 12.0–15.0)
Immature Granulocytes: 1 %
Lymphocytes Relative: 18 %
Lymphs Abs: 1.1 10*3/uL (ref 0.7–4.0)
MCH: 28.3 pg (ref 26.0–34.0)
MCHC: 32.5 g/dL (ref 30.0–36.0)
MCV: 87.2 fL (ref 80.0–100.0)
Monocytes Absolute: 1.1 10*3/uL — ABNORMAL HIGH (ref 0.1–1.0)
Monocytes Relative: 17 %
Neutro Abs: 3.9 10*3/uL (ref 1.7–7.7)
Neutrophils Relative %: 60 %
Platelet Count: 212 10*3/uL (ref 150–400)
RBC: 3.6 MIL/uL — ABNORMAL LOW (ref 3.87–5.11)
RDW: 14.6 % (ref 11.5–15.5)
WBC Count: 6.3 10*3/uL (ref 4.0–10.5)
nRBC: 0 % (ref 0.0–0.2)

## 2022-07-22 LAB — CMP (CANCER CENTER ONLY)
ALT: 15 U/L (ref 0–44)
AST: 11 U/L — ABNORMAL LOW (ref 15–41)
Albumin: 4 g/dL (ref 3.5–5.0)
Alkaline Phosphatase: 70 U/L (ref 38–126)
Anion gap: 5 (ref 5–15)
BUN: 12 mg/dL (ref 6–20)
CO2: 28 mmol/L (ref 22–32)
Calcium: 8.8 mg/dL — ABNORMAL LOW (ref 8.9–10.3)
Chloride: 108 mmol/L (ref 98–111)
Creatinine: 0.75 mg/dL (ref 0.44–1.00)
GFR, Estimated: >60 mL/min (ref >60)
Glucose, Bld: 101 mg/dL — ABNORMAL HIGH (ref 70–99)
Potassium: 3.8 mmol/L (ref 3.5–5.1)
Sodium: 141 mmol/L (ref 135–145)
Total Bilirubin: 0.3 mg/dL (ref 0.3–1.2)
Total Protein: 6.4 g/dL — ABNORMAL LOW (ref 6.5–8.1)

## 2022-07-22 MED ORDER — DEXAMETHASONE 4 MG PO TABS
20.0000 mg | ORAL_TABLET | Freq: Once | ORAL | Status: AC
  Administered 2022-07-22: 20 mg via ORAL
  Filled 2022-07-22: qty 5

## 2022-07-22 MED ORDER — SODIUM CHLORIDE 0.9 % IV SOLN: INTRAVENOUS | Status: AC

## 2022-07-22 MED ORDER — BORTEZOMIB CHEMO SQ INJECTION 3.5 MG (2.5MG/ML)
1.3000 mg/m2 | Freq: Once | INTRAMUSCULAR | Status: AC
  Administered 2022-07-22: 2.5 mg via SUBCUTANEOUS
  Filled 2022-07-22: qty 1

## 2022-07-22 NOTE — Patient Instructions (Signed)
[facility]  Discharge Instructions: Thank you for choosing [facility] to provide your oncology and hematology care.   If you have a lab appointment with the Cancer Center, please go directly to the Cancer Center and check in at the registration area.   Wear comfortable clothing and clothing appropriate for easy access to any Portacath or PICC line.   We strive to give you quality time with your provider. You may need to reschedule your appointment if you arrive late (15 or more minutes).  Arriving late affects you and other patients whose appointments are after yours.  Also, if you miss three or more appointments without notifying the office, you may be dismissed from the clinic at the provider's discretion.      For prescription refill requests, have your pharmacy contact our office and allow 72 hours for refills to be completed.    Today you received the following chemotherapy and/or immunotherapy agents : Velcade      To help prevent nausea and vomiting after your treatment, we encourage you to take your nausea medication as directed.  BELOW ARE SYMPTOMS THAT SHOULD BE REPORTED IMMEDIATELY: *FEVER GREATER THAN 100.4 F (38 C) OR HIGHER *CHILLS OR SWEATING *NAUSEA AND VOMITING THAT IS NOT CONTROLLED WITH YOUR NAUSEA MEDICATION *UNUSUAL SHORTNESS OF BREATH *UNUSUAL BRUISING OR BLEEDING *URINARY PROBLEMS (pain or burning when urinating, or frequent urination) *BOWEL PROBLEMS (unusual diarrhea, constipation, pain near the anus) TENDERNESS IN MOUTH AND THROAT WITH OR WITHOUT PRESENCE OF ULCERS (sore throat, sores in mouth, or a toothache) UNUSUAL RASH, SWELLING OR PAIN  UNUSUAL VAGINAL DISCHARGE OR ITCHING   Items with * indicate a potential emergency and should be followed up as soon as possible or go to the Emergency Department if any problems should occur.  Please show the CHEMOTHERAPY ALERT CARD or IMMUNOTHERAPY ALERT CARD at  check-in to the Emergency Department and triage nurse.  Should you have questions after your visit or need to cancel or reschedule your appointment, please contact [facility]  Dept: [phone]  and follow the prompts.  Office hours are 8:00 a.m. to 4:30 p.m. Monday - Friday. Please note that voicemails left after 4:00 p.m. may not be returned until the following business day.  We are closed weekends and major holidays. You have access to a nurse at all times for urgent questions. Please call the main number to the clinic Dept: [phone] and follow the prompts.   For any non-urgent questions, you may also contact your provider using MyChart. We now offer e-Visits for anyone 67 and older to request care online for non-urgent symptoms. For details visit [email or web address].   Also download the MyChart app! Go to the app store, search "MyChart", open the app, select [place], and log in with your MyChart username and password.  Dehydration, Adult Dehydration is a condition in which there is not enough water or other fluids in the body. This happens when a person loses more fluids than they take in. Important organs, such as the kidneys, brain, and heart, cannot function without a proper amount of fluids. Any loss of fluids from the body can lead to dehydration. Dehydration can be mild, moderate, or severe. It should be treated right away to prevent it from becoming severe. What are the causes? Dehydration may be caused by: Health conditions, such as diarrhea, vomiting, fever, infection, or sweating or urinating a lot. Not drinking enough fluids.  Certain medicines, such as medicines that remove excess fluid from the body (diuretics). Lack of safe drinking water. Not being able to get enough water and food. What increases the risk? The following factors may make you more likely to develop this condition: Having a long-term (chronic) illness that has  not been treated properly, such as diabetes, heart disease, or kidney disease. Being 97 years of age or older. Having a disability. Living in a place that is high in altitude, where thinner, drier air causes more fluid loss. Doing exercises that put stress on your body for a long time (endurance sports). Being active in a hot climate. What are the signs or symptoms? Symptoms of dehydration depend on how severe it is. Mild or moderate dehydration Thirst. Dry lips or dry mouth. Dizziness or light-headedness. Muscle cramps. Dark urine. Urine may be the color of tea. Less urine or tears produced than usual. Headache. Severe dehydration Changes in skin. Your skin may be cold and clammy, blotchy, or pale. Your skin also may not return to normal after being lightly pinched and released. Little or no tears, urine, or sweat. Rapid breathing and low blood pressure. Your pulse may be weak or may be faster than 100 beats per minute when you are sitting still. Other changes, such as: Feeling very thirsty. Sunken eyes. Cold hands and feet. Confusion. Being very tired (lethargic) or having trouble waking from sleep. Short-term weight loss. Loss of consciousness. How is this diagnosed? This condition is diagnosed based on your symptoms and a physical exam. You may have blood and urine tests to help confirm the diagnosis. How is this treated? Treatment for this condition depends on how severe it is. Treatment should be started right away. Do not wait until dehydration becomes severe. Severe dehydration is an emergency and needs to be treated in a hospital. Mild or moderate dehydration can be treated at home. You may be asked to: Drink more fluids. Drink an oral rehydration solution (ORS). This drink restores fluids, salts, and minerals in the blood (electrolytes). Stop any activities that caused dehydration, such as exercise. Cool off with cool compresses, cool mist, or cool fluids, if heat or too  much sweat caused your condition. Take medicine to treat fever, if fever caused your condition. Take medicine to treat nausea and diarrhea, if vomiting or diarrhea caused your condition. Severe dehydration can be treated: With IV fluids. By correcting abnormal levels of electrolytes in your body. By treating the underlying cause of dehydration. Follow these instructions at home: Oral rehydration solution If told by your health care provider, drink an ORS: Make an ORS by following instructions on the package. Start by drinking small amounts, about  cup (120 mL) every 5-10 minutes. Slowly increase how much you drink until you have taken the amount recommended by your health care provider.  Eating and drinking  Drink enough clear fluid to keep your urine pale yellow. If you were told to drink an ORS, finish the ORS first and then start slowly drinking other clear fluids. Drink fluids such as: Water. Do not drink only water. Doing that can lead to hyponatremia, which is having too little salt (sodium) in the body. Water from ice chips you suck on. Diluted fruit juice. This is fruit juice that you have added water to. Low-calorie sports drinks. Eat foods that contain a healthy balance of electrolytes, such as bananas, oranges, potatoes, tomatoes, and spinach. Do not drink alcohol. Avoid the following: Drinks that contain a lot of sugar.  These include high-calorie sports drinks, fruit juice that is not diluted, and soda. Caffeine. Foods that are greasy or contain a lot of fat or sugar. General instructions Take over-the-counter and prescription medicines only as told by your health care provider. Do not take sodium tablets. Doing that can lead to having too much sodium in the body (hypernatremia). Return to your normal activities as told by your health care provider. Ask your health care provider what activities are safe for you. Keep all follow-up visits. Your health care provider may need  to check your progress and suggest new ways to treat your condition. Contact a health care provider if: You have muscle cramps, pain, or discomfort, such as: Pain in your abdomen and the pain gets worse or stays in one area. Stiff neck. You have a rash. You are more irritable than usual. You are sleepier or have a harder time waking. You feel weak or dizzy. You feel very thirsty. Get help right away if: You have symptoms of severe dehydration. You vomit every time you eat or drink. Your vomiting gets worse, does not go away, or includes blood or green matter (bile). You are getting treatment but symptoms are getting worse. You have a fever. You have a severe headache. You have: Diarrhea that gets worse or does not go away. Blood in your stool. This may cause stool to look black and tarry. Not urinating, or urinating only a small amount of very dark urine, within 6-8 hours. You have trouble breathing. These symptoms may be an emergency. Get help right away. Do not wait to see if the symptoms will go away. Do not drive yourself to the hospital. Call 911. This information is not intended to replace advice given to you by your health care provider. Make sure you discuss any questions you have with your health care provider. Document Revised: 09/29/2021 Document Reviewed: 09/29/2021 Elsevier Patient Education  2023 [employer].

## 2022-07-23 ENCOUNTER — Encounter: Payer: Self-pay | Admitting: Hematology and Oncology

## 2022-07-23 MED ORDER — NYSTATIN 100000 UNIT/ML MT SUSP: 5.0000 mL | Freq: Four times a day (QID) | OROMUCOSAL | 0 refills | Status: AC

## 2022-07-23 NOTE — Progress Notes (Signed)
[facility] [phone]   [phone]  PROGRESS NOTE  Patient Care Team: Margaree Mackintosh, MD as PCP - General (Internal Medicine)  Hematological/Oncological History # Multiple Myeloma with Plasmacytoma 03/03/2022: established with the [facility] in Oncology  04/15/2022: CT biopsy and bone marrow biopsy confirmed the presence of a plasmacytoma and bone marrow involvement of multiple myeloma.  05/05/2022: Cycle 1 Day 1 of VRD therapy 05/27/2022: Cycle 2 Day 1 of VRD therapy 06/17/2022: Cycle 3 Day 1 of VRD therapy 07/08/2022: Cycle 4 Day 1 of VRD therapy Interval History:  Sonya Franklin 57 y.o. female with medical history significant for recently diagnosed multiple myeloma with plasmacytoma who presents for a follow up visit. The patient's last visit was on 07/08/2022. She presents today to start Cycle 4, Day 15 of VRD therapy.   On exam today Sonya Franklin reports she continues to tolerate her VRD therapy. She reports her energy levels are stable with intermittent episodes of fatigue. She adds that regardless she keeps going and completes her ADLs on her own. Her appetite is stable but does have a weird chalk like sensation in her mouth. She denies nausea, vomiting or abdominal pain. Her bowel habits are unchanged without recurrent episodes of diarrhea or constipation.  Her low back pain is overall the same. She continues to wear a lidocaine patch for pain relief. She does not want to try opoid medication.Overall she is willing and able to proceed with treatment at this time.. She denies fevers, chills, sweats, shortness of breath, chest pain or cough. She has no other complaints. Rest of the 10 point ROS is below.   MEDICAL HISTORY:  Past Medical History:  Diagnosis Date   Ankylosing spondylitis (HCC)    Anxiety    Arthritis    Breast cancer (HCC)    right breast   Bursitis of hip, right    Cardiac arrhythmia    Carotid stenosis 08/26/2021   GE reflux    Hiatal  hernia    Hypertension    Hyperthyroidism    Ischemic bowel disease (HCC)    Multiple myeloma not having achieved remission (HCC)    Prediabetes 08/26/2021   Snoring 03/21/2014   Vitamin D deficiency     SURGICAL HISTORY: Past Surgical History:  Procedure Laterality Date   ABDOMINAL HYSTERECTOMY     BREAST LUMPECTOMY     right   MASTECTOMY     double    SOCIAL HISTORY: Social History   Socioeconomic History   Marital status: Divorced    Spouse name: Not on file   Number of children: 1   Years of education: Not on file   Highest education level: Not on file  Occupational History    Employer: UNEMPLOYED  Tobacco Use   Smoking status: Never   Smokeless tobacco: Never  Vaping Use   Vaping Use: Never used  Substance and Sexual Activity   Alcohol use: No   Drug use: No   Sexual activity: Yes  Other Topics Concern   Not on file  Social History Narrative   Caffeine occasional drinker.   Not working/disabled.  12th grader. Divorced, one kids.       Social history: She receives disability benefits for history of breast cancer 2007 and ankylosing spondylitis.  She resides with special needs son.  She is divorced.  Non-smoker.  No alcohol consumption.       Family history: Father with history of prostate cancer.  Patient's mother was diagnosed with  breast cancer at age 105.  Maternal aunt with history of breast cancer.  3 brothers.   Right handed   Caffeine occas   One level stays on this level       Social Determinants of Health   Financial Resource Strain: Low Risk  (08/26/2021)   Overall Financial Resource Strain (CARDIA)    Difficulty of Paying Living Expenses: Not hard at all  Food Insecurity: No Food Insecurity (05/25/2022)   Hunger Vital Sign    Worried About Running Out of Food in the Last Year: Never true    Ran Out of Food in the Last Year: Never true  Transportation Needs: No Transportation Needs (08/26/2021)   PRAPARE - Scientist, research (physical sciences) (Medical): No    Lack of Transportation (Non-Medical): No  Physical Activity: Inactive (08/26/2021)   Exercise Vital Sign    Days of Exercise per Week: 0 days    Minutes of Exercise per Session: 0 min  Stress: Not on file  Social Connections: Not on file  Intimate Partner Violence: Not At Risk (04/21/2022)   Humiliation, Afraid, Rape, and Kick questionnaire    Fear of Current or Ex-Partner: No    Emotionally Abused: No    Physically Abused: No    Sexually Abused: No    FAMILY HISTORY: Family History  Problem Relation Age of Onset   Diabetes Mother    Hypertension Mother    Hyperlipidemia Mother    High blood pressure Father    High Cholesterol Father    Colitis Brother    Goiter Maternal Grandmother    Breast cancer Maternal Aunt     ALLERGIES:  is allergic to shellfish allergy, blue dyes (parenteral), and hydrochlorothiazide.  MEDICATIONS:  Current Outpatient Medications  Medication Sig Dispense Refill   acyclovir (ZOVIRAX) 400 MG tablet Take 1 tablet (400 mg total) by mouth 2 (two) times daily. 60 tablet 3   amLODipine (NORVASC) 5 MG tablet TAKE 1 TABLET (5 MG TOTAL) BY MOUTH DAILY. 90 tablet 2   aspirin EC 81 MG tablet Take 81 mg by mouth daily. Swallow whole.     furosemide (LASIX) 20 MG tablet Take 1 tablet (20 mg total) by mouth daily as needed. 30 tablet 1   HYDROcodone bit-homatropine (HYCODAN) 5-1.5 MG/5ML syrup Take 5 mLs by mouth every 8 (eight) hours as needed for cough. 120 mL 0   hyoscyamine (ANASPAZ) 0.125 MG TBDP disintergrating tablet Place 1 tablet (0.125 mg total) under the tongue every 4 (four) hours as needed for cramping. 30 tablet 0   KLOR-CON M20 20 MEQ tablet TAKE 2 TABLETS BY MOUTH DAILY 180 tablet 1   lenalidomide (REVLIMID) 25 MG capsule Take 1 capsule (25 mg total) by mouth daily. Take for 14 days on, then 7 days off. Repeat every 21 days. Auth # [id number] Date obtained 07/16/22 14 capsule 0   lidocaine (LIDODERM) 5 % Place 1 patch onto  the skin daily. Remove & Discard patch within 12 hours or as directed by MD 30 patch 0   metoprolol tartrate (LOPRESSOR) 25 MG tablet ONE HALF TAB BY MOUTH UP TO TWICE DAILY IF NEEDED FOR PALPITATIONS 90 tablet 1   olopatadine (PATANOL) 0.1 % ophthalmic solution Place 1 drop into both eyes 2 (two) times daily as needed. 5 mL PRN   pantoprazole (PROTONIX) 40 MG tablet TAKE 1 TABLET BY MOUTH EVERY DAY 90 tablet 3   triamcinolone cream (KENALOG) 0.1 % Apply 1 Application topically 3 (three)  times daily. (Patient taking differently: Apply 1 Application topically 3 (three) times daily. As needed) 30 g 1   traMADol (ULTRAM) 50 MG tablet Take 1 tablet (50 mg total) by mouth every 8 (eight) hours as needed for moderate pain. (Patient not taking: Reported on 07/13/2022) 30 tablet 0   No current facility-administered medications for this visit.    REVIEW OF SYSTEMS:   Constitutional: ( - ) fevers, ( - )  chills , ( - ) night sweats Eyes: ( - ) blurriness of vision, ( - ) double vision, ( - ) watery eyes Ears, nose, mouth, throat, and face: ( - ) mucositis, ( - ) sore throat Respiratory: ( - ) cough, ( - ) dyspnea, ( - ) wheezes Cardiovascular: ( -) palpitation, ( - ) chest discomfort, ( - ) lower extremity swelling Gastrointestinal:  ( - ) nausea, ( - ) heartburn, ( - ) change in bowel habits Skin: ( - ) abnormal skin rashes Lymphatics: ( - ) new lymphadenopathy, ( - ) easy bruising Neurological: ( - ) numbness, ( - ) tingling, ( - ) new weaknesses Behavioral/Psych: ( - ) mood change, ( - ) new changes  All other systems were reviewed with the patient and are negative.  PHYSICAL EXAMINATION: ECOG PERFORMANCE STATUS: 1 - Symptomatic but completely ambulatory  Vitals:   07/22/22 1314  BP: 132/63  Pulse: 76  Resp: 17  Temp: 97.9 F (36.6 C)  SpO2: 100%     Filed Weights   07/22/22 1314  Weight: 178 lb 6.4 oz (80.9 kg)     GENERAL: Well-appearing middle-aged African-American female,  alert, no distress and comfortable SKIN: skin color, texture, turgor are normal, no rashes or significant lesions MOUTH: thrush noted on tongue.  EYES: conjunctiva are pink and non-injected, sclera clear LUNGS: clear to auscultation and percussion with normal breathing effort HEART: regular rate & rhythm and no murmurs and no lower extremity edema Musculoskeletal: no cyanosis of digits and no clubbing  PSYCH: alert & oriented x 3, fluent speech NEURO: no focal motor/sensory deficits  LABORATORY DATA:  I have reviewed the data as listed    Latest Ref Rng & Units 07/22/2022   12:44 PM 07/15/2022    9:02 AM 07/12/2022    7:40 AM  CBC  WBC 4.0 - 10.5 K/uL 6.3  5.3  6.1   Hemoglobin 12.0 - 15.0 g/dL 16.1  09.6  04.5   Hematocrit 36.0 - 46.0 % 31.4  32.8  36.5   Platelets 150 - 400 K/uL 212  192  190        Latest Ref Rng & Units 07/22/2022   12:44 PM 07/15/2022    9:02 AM 07/12/2022    7:40 AM  CMP  Glucose 70 - 99 mg/dL 409  811  914   BUN 6 - 20 mg/dL 12  11  8    Creatinine 0.44 - 1.00 mg/dL 7.82  9.56  2.13   Sodium 135 - 145 mmol/L 141  143  140   Potassium 3.5 - 5.1 mmol/L 3.8  3.7  3.3   Chloride 98 - 111 mmol/L 108  109  102   CO2 22 - 32 mmol/L 28  28  23    Calcium 8.9 - 10.3 mg/dL 8.8  9.2  9.3   Total Protein 6.5 - 8.1 g/dL 6.4  6.7    Total Bilirubin 0.3 - 1.2 mg/dL 0.3  0.5    Alkaline Phos 38 - 126 U/L 70  70    AST 15 - 41 U/L 11  10    ALT 0 - 44 U/L 15  15      Lab Results  Component Value Date   MPROTEIN Not Observed 07/08/2022   MPROTEIN Not Observed 06/17/2022   MPROTEIN Not Observed 05/27/2022   Lab Results  Component Value Date   KPAFRELGTCHN 15.8 07/08/2022   KPAFRELGTCHN 18.1 06/17/2022   KPAFRELGTCHN 24.2 (H) 05/27/2022   LAMBDASER 12.6 07/08/2022   LAMBDASER 15.5 06/17/2022   LAMBDASER 17.9 05/27/2022   KAPLAMBRATIO 1.25 07/08/2022   KAPLAMBRATIO 1.17 06/17/2022   KAPLAMBRATIO 1.35 05/27/2022     RADIOGRAPHIC STUDIES: US THYROID  Result  Date: 06/24/2022 CLINICAL DATA:  Hypothyroid.  hypothyroidism; history of goiter EXAM: THYROID ULTRASOUND TECHNIQUE: Ultrasound examination of the thyroid gland and adjacent soft tissues was performed. COMPARISON:  US Thyroid, 01/20/2005. US Thyroid biopsy, 02/09/2005 - RIGHT inferior nodule. CT chest, 01/12/2022. FINDINGS: Parenchymal Echotexture: Mildly heterogenous Isthmus: 0.7 cm Right lobe: 5.8 x 2.1 x 2.1 cm Left lobe: 4.9 x 1.8 x 1.3 cm _________________________________________________________ Estimated total number of nodules >/= 1 cm: 3 Number of spongiform nodules >/=  2 cm not described below (TR1): 0 Number of mixed cystic and solid nodules >/= 1.5 cm not described below (TR2): 0 _________________________________________________________ Nodule # 2: Location: RIGHT; Inferior Maximum size: 2.0 cm; Other 2 dimensions: 1.7 x 1.4 cm, previously 2.7 x 2.5 x 1.8 cm Composition: solid/almost completely solid (2) Echogenicity: hypoechoic (2) Shape: not taller-than-wide (0) Margins: ill-defined (0) Echogenic foci: macrocalcifications (1) ACR TI-RADS total points: 5. ACR TI-RADS risk category: TR4 (4-6 points). This nodule has demonstrated interval involution. It was previously biopsied in 02/09/2005. Assuming a benign pathologic diagnosis, repeat sampling and/or dedicated follow-up is not recommended. _________________________________________________________ Nodule # 3: Location: LEFT; Inferior Maximum size: 1.2 cm; Other 2 dimensions: 0.9 x 0.9 cm Composition: solid/almost completely solid (2) Echogenicity: hypoechoic (2) Shape: not taller-than-wide (0) Margins: ill-defined (0) Echogenic foci: none (0) ACR TI-RADS total points: 4. ACR TI-RADS risk category: TR4 (4-6 points). ACR TI-RADS recommendations: *Given size (>/= 1 - 1.4 cm) and appearance, a follow-up ultrasound in 1 year should be considered based on TI-RADS criteria. _________________________________________________________ Additional nodules, such as  labeled # 1 (0.8 cm RIGHT inferior mixed cystic and solid, TR-2) and # 4 (1.0 cm RIGHT inferior cystic TR-1) do not meet threshold for follow-up nor biopsy per current criteria. No cervical adenopathy or abnormal fluid collection within the imaged neck. IMPRESSION: 1. Borderline-enlarged, multinodular thyroid consistent with a goiter. 2. 1.2 cm LEFT inferior TR-4 thyroid nodule. A follow-up ultrasound in 1 year should be considered based on TI-RADS criteria. 3. Involution of now 2.0 cm RIGHT inferior thyroid nodule. This was previously biopsied in 02/09/2005. Assuming a benign pathologic diagnosis, repeat sampling and/or dedicated follow-up is not recommended. The above is in keeping with the ACR TI-RADS recommendations - J Am Coll Radiol 2017;14:587-595. Electronically Signed   By: Roanna Banning M.D.   On: 06/24/2022 16:30    ASSESSMENT & PLAN Sonya Franklin 57 y.o. female with medical history significant for newly diagnosed multiple myeloma with plasmacytoma who presents for a follow up visit.  # Multiple Myeloma with Plasmactyoma --Confirmed with L5 bone lesion biopsy and bone marrow biopsy. --Started VRD therapy on 05/05/2022.  PLAN: --Due for Cycle 4 Day 15 of VRD therapy today.  --Labs today reviewed and adequate for treatment.  Labs show white blood cell count 6.3, hemoglobin 10.2, MCV 87.2, and platelets  of 212. Creatinine normal. --Last myeloma labs from 07/08/2022 showed no M protein is detected and free light chains are normal. --We discussed role for transplant and patient is agreeable to meet with [facility] transplant team. Referral sent and awaiting appt date.  --Continue with weekly treatment and q 2 week office visits  #Oral thrush: --sent prescription for Nystatin suspension  #Jitteriness/Palpitations: --Likely secondary to abnormal thyroid function, symptoms better controlled with thyroid management. --PO dexamethasone  was decreased to 12 mg 05/19/2022 as it was uncertain  if symptoms were secondary to medication. We have increased dexamethasone back to 20 mg with treatment starting 06/09/2022.  #Right lower extremity neuropathy: --Greatly improved --Monitor for now.   #Cancer related pain: --Secondary to focal tumor replacement of the right-side of the L5 vertebral body extending into the right L5 pedicle and posterior elements with a small extraosseous component extending into the right L5 neural foramen with moderate-severe stenosi -- Unable to tolerate opoid medications -- Mild improvement with lidocaine patch with tiger balm -- Established care with our palliative care service. -- Discussed options for palliative radiation to help relieve pain if systemic therapy is not alleviating pain. --No changes for now.   #Supportive Care -- chemotherapy education completed -- port placement not required.  -- zofran 8mg  q8H PRN and compazine 10mg  PO q6H for nausea -- acyclovir 400mg  PO BID for VCZ prophylaxis -- allopurinol 300mg  PO daily for TLS prophylaxis -- Pain medication as noted above  No orders of the defined types were placed in this encounter.  All questions were answered. The patient knows to call the clinic with any problems, questions or concerns.  A total of more than 30 minutes were spent on this encounter with face-to-face time and non-face-to-face time, including preparing to see the patient, ordering tests and/or medications, counseling the patient and coordination of care as outlined above.   Georga Kaufmann PA-C Dept of Hematology and Oncology [facility] Phone: [phone]   07/23/2022 6:43 AM

## 2022-07-28 NOTE — Progress Notes (Unsigned)
[facility] [phone]   [phone]  PROGRESS NOTE  Patient Care Team: Margaree Mackintosh, MD as PCP - General (Internal Medicine)  Hematological/Oncological History # Multiple Myeloma with Plasmacytoma 03/03/2022: established with the [facility] in Oncology  04/15/2022: CT biopsy and bone marrow biopsy confirmed the presence of a plasmacytoma and bone marrow involvement of multiple myeloma.  05/05/2022: Cycle 1 Day 1 of VRD therapy 05/27/2022: Cycle 2 Day 1 of VRD therapy 06/17/2022: Cycle 3 Day 1 of VRD therapy 07/08/2022: Cycle 4 Day 1 of VRD therapy 07/29/2022: Cycle 5 Day 1 of VRD therapy  Interval History:  Sonya Franklin 57 y.o. female with medical history significant for recently diagnosed multiple myeloma with plasmacytoma who presents for a follow up visit. The patient's last visit was on 07/22/2022. She presents today to start Cycle 5, Day 1 of VRD therapy.   On exam today Sonya Franklin reports she has been well overall in the interim since her last visit.  She reports she is tolerating her chemotherapy well other than some low energy.  She reports that she does have a "chemo fog".  She is also had some darkening of the skin on her hands.  She is eating well though she has had some alteration in her taste.  She notes that she is not having any numbness or tingling of her fingers or toes.  She notes that her skin does tend to be dry.  Otherwise she is at her baseline level of health.  She is agreeable to meet with [facility] to discuss bone marrow transplant, but at this time is favoring maintenance therapy with Revlimid. She denies fevers, chills, sweats, shortness of breath, chest pain or cough. She has no other complaints. Rest of the 10 point ROS is below.   MEDICAL HISTORY:  Past Medical History:  Diagnosis Date   Ankylosing spondylitis (HCC)    Anxiety    Arthritis    Breast cancer (HCC)    right breast   Bursitis of hip, right     Cardiac arrhythmia    Carotid stenosis 08/26/2021   GE reflux    Hiatal hernia    Hypertension    Hyperthyroidism    Ischemic bowel disease (HCC)    Multiple myeloma not having achieved remission (HCC)    Prediabetes 08/26/2021   Snoring 03/21/2014   Vitamin D deficiency     SURGICAL HISTORY: Past Surgical History:  Procedure Laterality Date   ABDOMINAL HYSTERECTOMY     BREAST LUMPECTOMY     right   MASTECTOMY     double    SOCIAL HISTORY: Social History   Socioeconomic History   Marital status: Divorced    Spouse name: Not on file   Number of children: 1   Years of education: Not on file   Highest education level: Not on file  Occupational History    Employer: UNEMPLOYED  Tobacco Use   Smoking status: Never   Smokeless tobacco: Never  Vaping Use   Vaping Use: Never used  Substance and Sexual Activity   Alcohol use: No   Drug use: No   Sexual activity: Yes  Other Topics Concern   Not on file  Social History Narrative   Caffeine occasional drinker.   Not working/disabled.  12th grader. Divorced, one kids.       Social history: She receives disability benefits for history of breast cancer 2007 and ankylosing spondylitis.  She resides with special needs  son.  She is divorced.  Non-smoker.  No alcohol consumption.       Family history: Father with history of prostate cancer.  Patient's mother was diagnosed with breast cancer at age 70.  Maternal aunt with history of breast cancer.  3 brothers.   Right handed   Caffeine occas   One level stays on this level       Social Determinants of Health   Financial Resource Strain: Low Risk  (08/26/2021)   Overall Financial Resource Strain (CARDIA)    Difficulty of Paying Living Expenses: Not hard at all  Food Insecurity: No Food Insecurity (05/25/2022)   Hunger Vital Sign    Worried About Running Out of Food in the Last Year: Never true    Ran Out of Food in the Last Year: Never true  Transportation Needs: No  Transportation Needs (08/26/2021)   PRAPARE - Administrator, Civil Service (Medical): No    Lack of Transportation (Non-Medical): No  Physical Activity: Inactive (08/26/2021)   Exercise Vital Sign    Days of Exercise per Week: 0 days    Minutes of Exercise per Session: 0 min  Stress: Not on file  Social Connections: Not on file  Intimate Partner Violence: Not At Risk (04/21/2022)   Humiliation, Afraid, Rape, and Kick questionnaire    Fear of Current or Ex-Partner: No    Emotionally Abused: No    Physically Abused: No    Sexually Abused: No    FAMILY HISTORY: Family History  Problem Relation Age of Onset   Diabetes Mother    Hypertension Mother    Hyperlipidemia Mother    High blood pressure Father    High Cholesterol Father    Colitis Brother    Goiter Maternal Grandmother    Breast cancer Maternal Aunt     ALLERGIES:  is allergic to shellfish allergy, blue dyes (parenteral), and hydrochlorothiazide.  MEDICATIONS:  Current Outpatient Medications  Medication Sig Dispense Refill   acyclovir (ZOVIRAX) 400 MG tablet Take 1 tablet (400 mg total) by mouth 2 (two) times daily. 60 tablet 3   amLODipine (NORVASC) 5 MG tablet TAKE 1 TABLET (5 MG TOTAL) BY MOUTH DAILY. 90 tablet 2   aspirin EC 81 MG tablet Take 81 mg by mouth daily. Swallow whole.     furosemide (LASIX) 20 MG tablet Take 1 tablet (20 mg total) by mouth daily as needed. 30 tablet 1   HYDROcodone bit-homatropine (HYCODAN) 5-1.5 MG/5ML syrup Take 5 mLs by mouth every 8 (eight) hours as needed for cough. 120 mL 0   hyoscyamine (ANASPAZ) 0.125 MG TBDP disintergrating tablet Place 1 tablet (0.125 mg total) under the tongue every 4 (four) hours as needed for cramping. 30 tablet 0   KLOR-CON M20 20 MEQ tablet TAKE 2 TABLETS BY MOUTH DAILY 180 tablet 1   lenalidomide (REVLIMID) 25 MG capsule Take 1 capsule (25 mg total) by mouth daily. Take for 14 days on, then 7 days off. Repeat every 21 days. Auth # [id number] Date  obtained 07/16/22 14 capsule 0   lidocaine (LIDODERM) 5 % Place 1 patch onto the skin daily. Remove & Discard patch within 12 hours or as directed by MD 30 patch 0   metoprolol tartrate (LOPRESSOR) 25 MG tablet ONE HALF TAB BY MOUTH UP TO TWICE DAILY IF NEEDED FOR PALPITATIONS 90 tablet 1   nystatin (MYCOSTATIN) 100000 UNIT/ML suspension Take 5 mLs (500,000 Units total) by mouth 4 (four) times daily. 60 mL 0  olopatadine (PATANOL) 0.1 % ophthalmic solution Place 1 drop into both eyes 2 (two) times daily as needed. 5 mL PRN   pantoprazole (PROTONIX) 40 MG tablet TAKE 1 TABLET BY MOUTH EVERY DAY 90 tablet 3   traMADol (ULTRAM) 50 MG tablet Take 1 tablet (50 mg total) by mouth every 8 (eight) hours as needed for moderate pain. (Patient not taking: Reported on 07/13/2022) 30 tablet 0   triamcinolone cream (KENALOG) 0.1 % Apply 1 Application topically 3 (three) times daily. (Patient taking differently: Apply 1 Application topically 3 (three) times daily. As needed) 30 g 1   No current facility-administered medications for this visit.    REVIEW OF SYSTEMS:   Constitutional: ( - ) fevers, ( - )  chills , ( - ) night sweats Eyes: ( - ) blurriness of vision, ( - ) double vision, ( - ) watery eyes Ears, nose, mouth, throat, and face: ( - ) mucositis, ( - ) sore throat Respiratory: ( - ) cough, ( - ) dyspnea, ( - ) wheezes Cardiovascular: ( - ) palpitation, ( - ) chest discomfort, ( - ) lower extremity swelling Gastrointestinal:  ( - ) nausea, ( - ) heartburn, ( - ) change in bowel habits Skin: ( - ) abnormal skin rashes Lymphatics: ( - ) new lymphadenopathy, ( - ) easy bruising Neurological: ( - ) numbness, ( - ) tingling, ( - ) new weaknesses Behavioral/Psych: ( - ) mood change, ( - ) new changes  All other systems were reviewed with the patient and are negative.  PHYSICAL EXAMINATION: ECOG PERFORMANCE STATUS: 1 - Symptomatic but completely ambulatory  Vitals:   07/29/22 1158  BP: (!) 146/75   Pulse: 77  Resp: 14  Temp: 97.9 F (36.6 C)  SpO2: 100%     Filed Weights   07/29/22 1158  Weight: 177 lb 12.8 oz (80.6 kg)      GENERAL: Well-appearing middle-aged African-American female, alert, no distress and comfortable SKIN: skin color, texture, turgor are normal, no rashes or significant lesions EYES: conjunctiva are pink and non-injected, sclera clear LUNGS: clear to auscultation and percussion with normal breathing effort HEART: regular rate & rhythm and no murmurs and no lower extremity edema Musculoskeletal: no cyanosis of digits and no clubbing  PSYCH: alert & oriented x 3, fluent speech NEURO: no focal motor/sensory deficits  LABORATORY DATA:  I have reviewed the data as listed    Latest Ref Rng & Units 07/29/2022   11:22 AM 07/22/2022   12:44 PM 07/15/2022    9:02 AM  CBC  WBC 4.0 - 10.5 K/uL 5.5  6.3  5.3   Hemoglobin 12.0 - 15.0 g/dL 16.1  09.6  04.5   Hematocrit 36.0 - 46.0 % 33.3  31.4  32.8   Platelets 150 - 400 K/uL 208  212  192        Latest Ref Rng & Units 07/29/2022   11:22 AM 07/22/2022   12:44 PM 07/15/2022    9:02 AM  CMP  Glucose 70 - 99 mg/dL 88  409  811   BUN 6 - 20 mg/dL 9  12  11    Creatinine 0.44 - 1.00 mg/dL 9.14  7.82  9.56   Sodium 135 - 145 mmol/L 142  141  143   Potassium 3.5 - 5.1 mmol/L 3.7  3.8  3.7   Chloride 98 - 111 mmol/L 109  108  109   CO2 22 - 32 mmol/L 27  28  28   Calcium 8.9 - 10.3 mg/dL 9.2  8.8  9.2   Total Protein 6.5 - 8.1 g/dL 6.9  6.4  6.7   Total Bilirubin 0.3 - 1.2 mg/dL 0.4  0.3  0.5   Alkaline Phos 38 - 126 U/L 71  70  70   AST 15 - 41 U/L 11  11  10    ALT 0 - 44 U/L 15  15  15      Lab Results  Component Value Date   MPROTEIN Not Observed 07/08/2022   MPROTEIN Not Observed 06/17/2022   MPROTEIN Not Observed 05/27/2022   Lab Results  Component Value Date   KPAFRELGTCHN 15.8 07/08/2022   KPAFRELGTCHN 18.1 06/17/2022   KPAFRELGTCHN 24.2 (H) 05/27/2022   LAMBDASER 12.6 07/08/2022   LAMBDASER 15.5  06/17/2022   LAMBDASER 17.9 05/27/2022   KAPLAMBRATIO 1.25 07/08/2022   KAPLAMBRATIO 1.17 06/17/2022   KAPLAMBRATIO 1.35 05/27/2022     RADIOGRAPHIC STUDIES: No results found.  ASSESSMENT & PLAN Sonya Franklin 57 y.o. female with medical history significant for newly diagnosed multiple myeloma with plasmacytoma who presents for a follow up visit.  # Multiple Myeloma with Plasmactyoma --Confirmed with L5 bone lesion biopsy and bone marrow biopsy. --Started VRD therapy on 05/05/2022.  PLAN: --Due for Cycle 5 Day 1 of VRD therapy today.  --Labs today reviewed and adequate for treatment.  Labs show white blood cell count 5.5, hemoglobin 9.0, MCV 86.9, and platelets of 2 8 --Last myeloma labs from 07/08/2022 showed significant improvement with kappa free light chain decreased from 228.6 to 15.8, ratio improved from 18.44 to 1.25. M protein undetectable. --Patient currently has appointment scheduled with [facility] --Continue with weekly treatment and q 2 week office visits  #Jitteriness/Palpitations: --Likely secondary to steroid therapy versus abnormal thyroid function, symptoms improving with thyroid management.  --recommend increase steroid back to full dose at 40mg  PO dexamethasone.   #Right lower extremity neuropathy: --Greatly improved --Monitor for now.   #Cancer related pain: --Secondary to focal tumor replacement of the right-side of the L5 vertebral body extending into the right L5 pedicle and posterior elements with a small extraosseous component extending into the right L5 neural foramen with moderate-severe stenosi -- Unable to tolerate opoid medications -- Sent lidocaine patches but did not pick up. Advised to try since she is hesitant to try opoid medications.  -- Made referral to our palliative care service. -- Discussed palliative radiation to help relieve pain if systemic therapy is not alleviating pain.  #Supportive Care -- chemotherapy education  completed -- port placement not required.  -- zofran 8mg  q8H PRN and compazine 10mg  PO q6H for nausea -- acyclovir 400mg  PO BID for VCZ prophylaxis -- allopurinol 300mg  PO daily for TLS prophylaxis -- Pain medication as noted above  Orders Placed This Encounter  Procedures   Multiple Myeloma Panel (SPEP&IFE w/QIG)    Standing Status:   Future    Standing Expiration Date:   09/09/2023   Kappa/lambda light chains    Standing Status:   Future    Standing Expiration Date:   09/09/2023   CBC with Differential (Cancer Center Only)    Standing Status:   Future    Standing Expiration Date:   09/09/2023   CMP (Cancer Center only)    Standing Status:   Future    Standing Expiration Date:   09/09/2023   CBC with Differential (Cancer Center Only)    Standing Status:   Future  Standing Expiration Date:   09/16/2023   CMP (Cancer Center only)    Standing Status:   Future    Standing Expiration Date:   09/16/2023   CBC with Differential (Cancer Center Only)    Standing Status:   Future    Standing Expiration Date:   09/23/2023   CMP (Cancer Center only)    Standing Status:   Future    Standing Expiration Date:   09/23/2023   All questions were answered. The patient knows to call the clinic with any problems, questions or concerns.  A total of more than 30 minutes were spent on this encounter with face-to-face time and non-face-to-face time, including preparing to see the patient, ordering tests and/or medications, counseling the patient and coordination of care as outlined above.   Ulysees Barns, MD Department of Hematology/Oncology [facility] Phone: [phone] Pager: [phone] Email: [email or web address]  07/30/2022 9:01 AM

## 2022-07-29 ENCOUNTER — Inpatient Hospital Stay: Payer: Medicare Other | Admitting: Hematology and Oncology

## 2022-07-29 ENCOUNTER — Inpatient Hospital Stay: Payer: Medicare Other

## 2022-07-29 ENCOUNTER — Other Ambulatory Visit: Payer: Self-pay

## 2022-07-29 VITALS — BP 146/75 | HR 77 | Temp 97.9°F | Resp 14 | Wt 177.8 lb

## 2022-07-29 VITALS — BP 137/70 | HR 87 | Temp 98.1°F | Resp 18

## 2022-07-29 DIAGNOSIS — R002 Palpitations: Secondary | ICD-10-CM | POA: Diagnosis not present

## 2022-07-29 DIAGNOSIS — C9000 Multiple myeloma not having achieved remission: Secondary | ICD-10-CM | POA: Diagnosis not present

## 2022-07-29 DIAGNOSIS — Z803 Family history of malignant neoplasm of breast: Secondary | ICD-10-CM | POA: Diagnosis not present

## 2022-07-29 DIAGNOSIS — B370 Candidal stomatitis: Secondary | ICD-10-CM | POA: Diagnosis not present

## 2022-07-29 DIAGNOSIS — E039 Hypothyroidism, unspecified: Secondary | ICD-10-CM | POA: Diagnosis not present

## 2022-07-29 DIAGNOSIS — Z9011 Acquired absence of right breast and nipple: Secondary | ICD-10-CM | POA: Diagnosis not present

## 2022-07-29 DIAGNOSIS — M129 Arthropathy, unspecified: Secondary | ICD-10-CM | POA: Diagnosis not present

## 2022-07-29 DIAGNOSIS — Z7982 Long term (current) use of aspirin: Secondary | ICD-10-CM | POA: Diagnosis not present

## 2022-07-29 DIAGNOSIS — R5383 Other fatigue: Secondary | ICD-10-CM | POA: Diagnosis not present

## 2022-07-29 DIAGNOSIS — E559 Vitamin D deficiency, unspecified: Secondary | ICD-10-CM | POA: Diagnosis not present

## 2022-07-29 DIAGNOSIS — C50911 Malignant neoplasm of unspecified site of right female breast: Secondary | ICD-10-CM | POA: Diagnosis not present

## 2022-07-29 DIAGNOSIS — I10 Essential (primary) hypertension: Secondary | ICD-10-CM | POA: Diagnosis not present

## 2022-07-29 DIAGNOSIS — K219 Gastro-esophageal reflux disease without esophagitis: Secondary | ICD-10-CM | POA: Diagnosis not present

## 2022-07-29 DIAGNOSIS — Z79899 Other long term (current) drug therapy: Secondary | ICD-10-CM | POA: Diagnosis not present

## 2022-07-29 DIAGNOSIS — G893 Neoplasm related pain (acute) (chronic): Secondary | ICD-10-CM | POA: Diagnosis not present

## 2022-07-29 DIAGNOSIS — M459 Ankylosing spondylitis of unspecified sites in spine: Secondary | ICD-10-CM | POA: Diagnosis not present

## 2022-07-29 DIAGNOSIS — K449 Diaphragmatic hernia without obstruction or gangrene: Secondary | ICD-10-CM | POA: Diagnosis not present

## 2022-07-29 DIAGNOSIS — Z5112 Encounter for antineoplastic immunotherapy: Secondary | ICD-10-CM | POA: Diagnosis not present

## 2022-07-29 LAB — CBC WITH DIFFERENTIAL (CANCER CENTER ONLY)
Abs Immature Granulocytes: 0.01 10*3/uL (ref 0.00–0.07)
Basophils Absolute: 0.1 10*3/uL (ref 0.0–0.1)
Basophils Relative: 1 %
Eosinophils Absolute: 0.2 10*3/uL (ref 0.0–0.5)
Eosinophils Relative: 4 %
HCT: 33.3 % — ABNORMAL LOW (ref 36.0–46.0)
Hemoglobin: 11 g/dL — ABNORMAL LOW (ref 12.0–15.0)
Immature Granulocytes: 0 %
Lymphocytes Relative: 23 %
Lymphs Abs: 1.2 10*3/uL (ref 0.7–4.0)
MCH: 28.7 pg (ref 26.0–34.0)
MCHC: 33 g/dL (ref 30.0–36.0)
MCV: 86.9 fL (ref 80.0–100.0)
Monocytes Absolute: 1.1 10*3/uL — ABNORMAL HIGH (ref 0.1–1.0)
Monocytes Relative: 21 %
Neutro Abs: 2.8 10*3/uL (ref 1.7–7.7)
Neutrophils Relative %: 51 %
Platelet Count: 208 10*3/uL (ref 150–400)
RBC: 3.83 MIL/uL — ABNORMAL LOW (ref 3.87–5.11)
RDW: 14.6 % (ref 11.5–15.5)
WBC Count: 5.5 10*3/uL (ref 4.0–10.5)
nRBC: 0 % (ref 0.0–0.2)

## 2022-07-29 LAB — CMP (CANCER CENTER ONLY)
ALT: 15 U/L (ref 0–44)
AST: 11 U/L — ABNORMAL LOW (ref 15–41)
Albumin: 4.2 g/dL (ref 3.5–5.0)
Alkaline Phosphatase: 71 U/L (ref 38–126)
Anion gap: 6 (ref 5–15)
BUN: 9 mg/dL (ref 6–20)
CO2: 27 mmol/L (ref 22–32)
Calcium: 9.2 mg/dL (ref 8.9–10.3)
Chloride: 109 mmol/L (ref 98–111)
Creatinine: 0.64 mg/dL (ref 0.44–1.00)
GFR, Estimated: >60 mL/min (ref >60)
Glucose, Bld: 88 mg/dL (ref 70–99)
Potassium: 3.7 mmol/L (ref 3.5–5.1)
Sodium: 142 mmol/L (ref 135–145)
Total Bilirubin: 0.4 mg/dL (ref 0.3–1.2)
Total Protein: 6.9 g/dL (ref 6.5–8.1)

## 2022-07-29 MED ORDER — DEXAMETHASONE 4 MG PO TABS
20.0000 mg | ORAL_TABLET | Freq: Once | ORAL | Status: AC
  Administered 2022-07-29: 20 mg via ORAL
  Filled 2022-07-29: qty 5

## 2022-07-29 MED ORDER — SODIUM CHLORIDE 0.9 % IV SOLN: INTRAVENOUS | Status: AC

## 2022-07-29 MED ORDER — BORTEZOMIB CHEMO SQ INJECTION 3.5 MG (2.5MG/ML)
1.3000 mg/m2 | Freq: Once | INTRAMUSCULAR | Status: AC
  Administered 2022-07-29: 2.5 mg via SUBCUTANEOUS
  Filled 2022-07-29: qty 1

## 2022-07-30 ENCOUNTER — Encounter: Payer: Self-pay | Admitting: Hematology and Oncology

## 2022-07-30 LAB — KAPPA/LAMBDA LIGHT CHAINS
Kappa free light chain: 13.9 mg/L (ref 3.3–19.4)
Kappa, lambda light chain ratio: 1.35 (ref 0.26–1.65)
Lambda free light chains: 10.3 mg/L (ref 5.7–26.3)

## 2022-07-31 ENCOUNTER — Other Ambulatory Visit: Payer: Self-pay | Admitting: Hematology and Oncology

## 2022-07-31 ENCOUNTER — Other Ambulatory Visit: Payer: Self-pay | Admitting: *Deleted

## 2022-07-31 DIAGNOSIS — C9000 Multiple myeloma not having achieved remission: Secondary | ICD-10-CM

## 2022-07-31 MED ORDER — LENALIDOMIDE 25 MG PO CAPS
25.0000 mg | ORAL_CAPSULE | Freq: Every day | ORAL | 0 refills | Status: DC
Start: 2022-07-31 — End: 2022-08-20

## 2022-08-05 ENCOUNTER — Inpatient Hospital Stay: Payer: Medicare Other

## 2022-08-05 ENCOUNTER — Other Ambulatory Visit: Payer: Self-pay

## 2022-08-05 VITALS — BP 128/64 | HR 64 | Temp 98.2°F | Resp 14 | Wt 175.2 lb

## 2022-08-05 DIAGNOSIS — K219 Gastro-esophageal reflux disease without esophagitis: Secondary | ICD-10-CM | POA: Diagnosis not present

## 2022-08-05 DIAGNOSIS — G893 Neoplasm related pain (acute) (chronic): Secondary | ICD-10-CM | POA: Diagnosis not present

## 2022-08-05 DIAGNOSIS — M129 Arthropathy, unspecified: Secondary | ICD-10-CM | POA: Diagnosis not present

## 2022-08-05 DIAGNOSIS — K449 Diaphragmatic hernia without obstruction or gangrene: Secondary | ICD-10-CM | POA: Diagnosis not present

## 2022-08-05 DIAGNOSIS — C9000 Multiple myeloma not having achieved remission: Secondary | ICD-10-CM

## 2022-08-05 DIAGNOSIS — Z803 Family history of malignant neoplasm of breast: Secondary | ICD-10-CM | POA: Diagnosis not present

## 2022-08-05 DIAGNOSIS — B370 Candidal stomatitis: Secondary | ICD-10-CM | POA: Diagnosis not present

## 2022-08-05 DIAGNOSIS — Z9011 Acquired absence of right breast and nipple: Secondary | ICD-10-CM | POA: Diagnosis not present

## 2022-08-05 DIAGNOSIS — M459 Ankylosing spondylitis of unspecified sites in spine: Secondary | ICD-10-CM | POA: Diagnosis not present

## 2022-08-05 DIAGNOSIS — R5383 Other fatigue: Secondary | ICD-10-CM | POA: Diagnosis not present

## 2022-08-05 DIAGNOSIS — E039 Hypothyroidism, unspecified: Secondary | ICD-10-CM | POA: Diagnosis not present

## 2022-08-05 DIAGNOSIS — R002 Palpitations: Secondary | ICD-10-CM | POA: Diagnosis not present

## 2022-08-05 DIAGNOSIS — Z5112 Encounter for antineoplastic immunotherapy: Secondary | ICD-10-CM | POA: Diagnosis not present

## 2022-08-05 DIAGNOSIS — E559 Vitamin D deficiency, unspecified: Secondary | ICD-10-CM | POA: Diagnosis not present

## 2022-08-05 DIAGNOSIS — Z7982 Long term (current) use of aspirin: Secondary | ICD-10-CM | POA: Diagnosis not present

## 2022-08-05 DIAGNOSIS — Z79899 Other long term (current) drug therapy: Secondary | ICD-10-CM | POA: Diagnosis not present

## 2022-08-05 DIAGNOSIS — I10 Essential (primary) hypertension: Secondary | ICD-10-CM | POA: Diagnosis not present

## 2022-08-05 LAB — CBC WITH DIFFERENTIAL (CANCER CENTER ONLY)
Abs Immature Granulocytes: 0.02 10*3/uL (ref 0.00–0.07)
Basophils Absolute: 0 10*3/uL (ref 0.0–0.1)
Basophils Relative: 1 %
Eosinophils Absolute: 0.2 10*3/uL (ref 0.0–0.5)
Eosinophils Relative: 2 %
HCT: 36 % (ref 36.0–46.0)
Hemoglobin: 11.6 g/dL — ABNORMAL LOW (ref 12.0–15.0)
Immature Granulocytes: 0 %
Lymphocytes Relative: 24 %
Lymphs Abs: 1.6 10*3/uL (ref 0.7–4.0)
MCH: 28.3 pg (ref 26.0–34.0)
MCHC: 32.2 g/dL (ref 30.0–36.0)
MCV: 87.8 fL (ref 80.0–100.0)
Monocytes Absolute: 1.3 10*3/uL — ABNORMAL HIGH (ref 0.1–1.0)
Monocytes Relative: 19 %
Neutro Abs: 3.5 10*3/uL (ref 1.7–7.7)
Neutrophils Relative %: 54 %
Platelet Count: 236 10*3/uL (ref 150–400)
RBC: 4.1 MIL/uL (ref 3.87–5.11)
RDW: 15.2 % (ref 11.5–15.5)
WBC Count: 6.6 10*3/uL (ref 4.0–10.5)
nRBC: 0 % (ref 0.0–0.2)

## 2022-08-05 LAB — CMP (CANCER CENTER ONLY)
ALT: 13 U/L (ref 0–44)
AST: 11 U/L — ABNORMAL LOW (ref 15–41)
Albumin: 4.4 g/dL (ref 3.5–5.0)
Alkaline Phosphatase: 77 U/L (ref 38–126)
Anion gap: 7 (ref 5–15)
BUN: 15 mg/dL (ref 6–20)
CO2: 25 mmol/L (ref 22–32)
Calcium: 9.3 mg/dL (ref 8.9–10.3)
Chloride: 110 mmol/L (ref 98–111)
Creatinine: 0.71 mg/dL (ref 0.44–1.00)
GFR, Estimated: >60 mL/min (ref >60)
Glucose, Bld: 78 mg/dL (ref 70–99)
Potassium: 4.1 mmol/L (ref 3.5–5.1)
Sodium: 142 mmol/L (ref 135–145)
Total Bilirubin: 0.4 mg/dL (ref 0.3–1.2)
Total Protein: 7 g/dL (ref 6.5–8.1)

## 2022-08-05 MED ORDER — SODIUM CHLORIDE 0.9 % IV SOLN: INTRAVENOUS | Status: AC

## 2022-08-05 MED ORDER — BORTEZOMIB CHEMO SQ INJECTION 3.5 MG (2.5MG/ML)
1.3000 mg/m2 | Freq: Once | INTRAMUSCULAR | Status: AC
  Administered 2022-08-05: 2.5 mg via SUBCUTANEOUS
  Filled 2022-08-05: qty 1

## 2022-08-05 MED ORDER — DEXAMETHASONE 4 MG PO TABS
20.0000 mg | ORAL_TABLET | Freq: Once | ORAL | Status: AC
  Administered 2022-08-05: 20 mg via ORAL
  Filled 2022-08-05: qty 5

## 2022-08-06 DIAGNOSIS — C9000 Multiple myeloma not having achieved remission: Secondary | ICD-10-CM | POA: Diagnosis not present

## 2022-08-06 LAB — MULTIPLE MYELOMA PANEL, SERUM
Albumin SerPl Elph-Mcnc: 3.6 g/dL (ref 2.9–4.4)
Albumin/Glob SerPl: 1.4 (ref 0.7–1.7)
Alpha 1: 0.3 g/dL (ref 0.0–0.4)
Alpha2 Glob SerPl Elph-Mcnc: 0.8 g/dL (ref 0.4–1.0)
B-Globulin SerPl Elph-Mcnc: 1.1 g/dL (ref 0.7–1.3)
Gamma Glob SerPl Elph-Mcnc: 0.5 g/dL (ref 0.4–1.8)
Globulin, Total: 2.7 g/dL (ref 2.2–3.9)
IgA: 72 mg/dL — ABNORMAL LOW (ref 87–352)
IgG (Immunoglobin G), Serum: 706 mg/dL (ref 586–1602)
IgM (Immunoglobulin M), Srm: 27 mg/dL (ref 26–217)
Total Protein ELP: 6.3 g/dL (ref 6.0–8.5)

## 2022-08-11 ENCOUNTER — Inpatient Hospital Stay: Payer: Medicare Other

## 2022-08-11 ENCOUNTER — Inpatient Hospital Stay (HOSPITAL_BASED_OUTPATIENT_CLINIC_OR_DEPARTMENT_OTHER): Payer: Medicare Other | Admitting: Physician Assistant

## 2022-08-11 ENCOUNTER — Ambulatory Visit: Payer: Medicare Other

## 2022-08-11 VITALS — BP 146/70 | HR 73 | Resp 17

## 2022-08-11 DIAGNOSIS — C9000 Multiple myeloma not having achieved remission: Secondary | ICD-10-CM

## 2022-08-11 DIAGNOSIS — R002 Palpitations: Secondary | ICD-10-CM | POA: Diagnosis not present

## 2022-08-11 DIAGNOSIS — E039 Hypothyroidism, unspecified: Secondary | ICD-10-CM | POA: Diagnosis not present

## 2022-08-11 DIAGNOSIS — Z9011 Acquired absence of right breast and nipple: Secondary | ICD-10-CM | POA: Diagnosis not present

## 2022-08-11 DIAGNOSIS — M129 Arthropathy, unspecified: Secondary | ICD-10-CM | POA: Diagnosis not present

## 2022-08-11 DIAGNOSIS — I10 Essential (primary) hypertension: Secondary | ICD-10-CM | POA: Diagnosis not present

## 2022-08-11 DIAGNOSIS — R5383 Other fatigue: Secondary | ICD-10-CM | POA: Diagnosis not present

## 2022-08-11 DIAGNOSIS — Z79899 Other long term (current) drug therapy: Secondary | ICD-10-CM | POA: Diagnosis not present

## 2022-08-11 DIAGNOSIS — K219 Gastro-esophageal reflux disease without esophagitis: Secondary | ICD-10-CM | POA: Diagnosis not present

## 2022-08-11 DIAGNOSIS — M459 Ankylosing spondylitis of unspecified sites in spine: Secondary | ICD-10-CM | POA: Diagnosis not present

## 2022-08-11 DIAGNOSIS — Z7982 Long term (current) use of aspirin: Secondary | ICD-10-CM | POA: Diagnosis not present

## 2022-08-11 DIAGNOSIS — E559 Vitamin D deficiency, unspecified: Secondary | ICD-10-CM | POA: Diagnosis not present

## 2022-08-11 DIAGNOSIS — G893 Neoplasm related pain (acute) (chronic): Secondary | ICD-10-CM | POA: Diagnosis not present

## 2022-08-11 DIAGNOSIS — Z803 Family history of malignant neoplasm of breast: Secondary | ICD-10-CM | POA: Diagnosis not present

## 2022-08-11 DIAGNOSIS — Z5112 Encounter for antineoplastic immunotherapy: Secondary | ICD-10-CM | POA: Diagnosis not present

## 2022-08-11 DIAGNOSIS — K449 Diaphragmatic hernia without obstruction or gangrene: Secondary | ICD-10-CM | POA: Diagnosis not present

## 2022-08-11 DIAGNOSIS — B370 Candidal stomatitis: Secondary | ICD-10-CM | POA: Diagnosis not present

## 2022-08-11 LAB — CMP (CANCER CENTER ONLY)
ALT: 13 U/L (ref 0–44)
AST: 11 U/L — ABNORMAL LOW (ref 15–41)
Albumin: 4.1 g/dL (ref 3.5–5.0)
Alkaline Phosphatase: 75 U/L (ref 38–126)
Anion gap: 8 (ref 5–15)
BUN: 10 mg/dL (ref 6–20)
CO2: 24 mmol/L (ref 22–32)
Calcium: 8.9 mg/dL (ref 8.9–10.3)
Chloride: 108 mmol/L (ref 98–111)
Creatinine: 0.62 mg/dL (ref 0.44–1.00)
GFR, Estimated: >60 mL/min (ref >60)
Glucose, Bld: 95 mg/dL (ref 70–99)
Potassium: 3.4 mmol/L — ABNORMAL LOW (ref 3.5–5.1)
Sodium: 140 mmol/L (ref 135–145)
Total Bilirubin: 0.4 mg/dL (ref 0.3–1.2)
Total Protein: 6.5 g/dL (ref 6.5–8.1)

## 2022-08-11 LAB — CBC WITH DIFFERENTIAL (CANCER CENTER ONLY)
Abs Immature Granulocytes: 0.02 10*3/uL (ref 0.00–0.07)
Basophils Absolute: 0 10*3/uL (ref 0.0–0.1)
Basophils Relative: 0 %
Eosinophils Absolute: 0.2 10*3/uL (ref 0.0–0.5)
Eosinophils Relative: 3 %
HCT: 32.9 % — ABNORMAL LOW (ref 36.0–46.0)
Hemoglobin: 10.9 g/dL — ABNORMAL LOW (ref 12.0–15.0)
Immature Granulocytes: 0 %
Lymphocytes Relative: 21 %
Lymphs Abs: 1.1 10*3/uL (ref 0.7–4.0)
MCH: 28.4 pg (ref 26.0–34.0)
MCHC: 33.1 g/dL (ref 30.0–36.0)
MCV: 85.7 fL (ref 80.0–100.0)
Monocytes Absolute: 0.7 10*3/uL (ref 0.1–1.0)
Monocytes Relative: 13 %
Neutro Abs: 3.2 10*3/uL (ref 1.7–7.7)
Neutrophils Relative %: 63 %
Platelet Count: 218 10*3/uL (ref 150–400)
RBC: 3.84 MIL/uL — ABNORMAL LOW (ref 3.87–5.11)
RDW: 15 % (ref 11.5–15.5)
WBC Count: 5.1 10*3/uL (ref 4.0–10.5)
nRBC: 0 % (ref 0.0–0.2)

## 2022-08-11 MED ORDER — DEXAMETHASONE 4 MG PO TABS
20.0000 mg | ORAL_TABLET | Freq: Once | ORAL | Status: AC
  Administered 2022-08-11: 20 mg via ORAL
  Filled 2022-08-11: qty 5

## 2022-08-11 MED ORDER — BORTEZOMIB CHEMO SQ INJECTION 3.5 MG (2.5MG/ML)
1.3000 mg/m2 | Freq: Once | INTRAMUSCULAR | Status: AC
  Administered 2022-08-11: 2.5 mg via SUBCUTANEOUS
  Filled 2022-08-11: qty 1

## 2022-08-11 MED ORDER — SODIUM CHLORIDE 0.9 % IV SOLN: INTRAVENOUS | Status: AC

## 2022-08-11 NOTE — Progress Notes (Signed)
[facility] [phone]   [phone]  PROGRESS NOTE  Patient Care Team: Margaree Mackintosh, MD as PCP - General (Internal Medicine)  Hematological/Oncological History # Multiple Myeloma with Plasmacytoma 03/03/2022: established with the [facility] in Oncology  04/15/2022: CT biopsy and bone marrow biopsy confirmed the presence of a plasmacytoma and bone marrow involvement of multiple myeloma.  05/05/2022: Cycle 1 Day 1 of VRD therapy 05/27/2022: Cycle 2 Day 1 of VRD therapy 06/17/2022: Cycle 3 Day 1 of VRD therapy 07/08/2022: Cycle 4 Day 1 of VRD therapy 07/29/2022: Cycle 5 Day 1 of VRD therapy  Interval History:  Sonya Franklin 57 y.o. female with medical history significant for recently diagnosed multiple myeloma with plasmacytoma who presents for a follow up visit. The patient's last visit was on 07/29/2022. She presents today to start Cycle 5, Day 15 of VRD therapy.   On exam today Sonya Franklin reports she is tolerating treatment well without any new or concerning symptoms. She does have fatigue after each treatment which requires her to rest. She continues to complete her ADLs on her own. She reports stable weight stable 07/29/2022. She denies nausea, vomiting or abdominal pain. Bowel habits are unchanged without recurrent episodes of diarrhea or constipation. She denies easy bruising or signs of bleeding. She denies neuropathy in her fingers or toes. She reports stable low back pain. She denies fevers, chills, sweats, shortness of breath, chest pain or cough. She has no other complaints. Rest of the 10 point ROS is below.   MEDICAL HISTORY:  Past Medical History:  Diagnosis Date   Ankylosing spondylitis (HCC)    Anxiety    Arthritis    Breast cancer (HCC)    right breast   Bursitis of hip, right    Cardiac arrhythmia    Carotid stenosis 08/26/2021   GE reflux    Hiatal hernia    Hypertension    Hyperthyroidism    Ischemic bowel disease (HCC)    Multiple  myeloma not having achieved remission (HCC)    Prediabetes 08/26/2021   Snoring 03/21/2014   Vitamin D deficiency     SURGICAL HISTORY: Past Surgical History:  Procedure Laterality Date   ABDOMINAL HYSTERECTOMY     BREAST LUMPECTOMY     right   MASTECTOMY     double    SOCIAL HISTORY: Social History   Socioeconomic History   Marital status: Divorced    Spouse name: Not on file   Number of children: 1   Years of education: Not on file   Highest education level: Not on file  Occupational History    Employer: UNEMPLOYED  Tobacco Use   Smoking status: Never   Smokeless tobacco: Never  Vaping Use   Vaping Use: Never used  Substance and Sexual Activity   Alcohol use: No   Drug use: No   Sexual activity: Yes  Other Topics Concern   Not on file  Social History Narrative   Caffeine occasional drinker.   Not working/disabled.  12th grader. Divorced, one kids.       Social history: She receives disability benefits for history of breast cancer 2007 and ankylosing spondylitis.  She resides with special needs son.  She is divorced.  Non-smoker.  No alcohol consumption.       Family history: Father with history of prostate cancer.  Patient's mother was diagnosed with breast cancer at age 58.  Maternal aunt with history of breast cancer.  3 brothers.   Right  handed   Caffeine occas   One level stays on this level       Social Determinants of Health   Financial Resource Strain: Low Risk  (08/26/2021)   Overall Financial Resource Strain (CARDIA)    Difficulty of Paying Living Expenses: Not hard at all  Food Insecurity: No Food Insecurity (05/25/2022)   Hunger Vital Sign    Worried About Running Out of Food in the Last Year: Never true    Ran Out of Food in the Last Year: Never true  Transportation Needs: No Transportation Needs (08/26/2021)   PRAPARE - Administrator, Civil Service (Medical): No    Lack of Transportation (Non-Medical): No  Physical Activity:  Inactive (08/26/2021)   Exercise Vital Sign    Days of Exercise per Week: 0 days    Minutes of Exercise per Session: 0 min  Stress: Not on file  Social Connections: Not on file  Intimate Partner Violence: Not At Risk (04/21/2022)   Humiliation, Afraid, Rape, and Kick questionnaire    Fear of Current or Ex-Partner: No    Emotionally Abused: No    Physically Abused: No    Sexually Abused: No    FAMILY HISTORY: Family History  Problem Relation Age of Onset   Diabetes Mother    Hypertension Mother    Hyperlipidemia Mother    High blood pressure Father    High Cholesterol Father    Colitis Brother    Goiter Maternal Grandmother    Breast cancer Maternal Aunt     ALLERGIES:  is allergic to shellfish allergy, blue dyes (parenteral), and hydrochlorothiazide.  MEDICATIONS:  Current Outpatient Medications  Medication Sig Dispense Refill   acyclovir (ZOVIRAX) 400 MG tablet TAKE 1 TABLET BY MOUTH TWICE A DAY 180 tablet 1   amLODipine (NORVASC) 5 MG tablet TAKE 1 TABLET (5 MG TOTAL) BY MOUTH DAILY. 90 tablet 2   aspirin EC 81 MG tablet Take 81 mg by mouth daily. Swallow whole.     furosemide (LASIX) 20 MG tablet Take 1 tablet (20 mg total) by mouth daily as needed. 30 tablet 1   HYDROcodone bit-homatropine (HYCODAN) 5-1.5 MG/5ML syrup Take 5 mLs by mouth every 8 (eight) hours as needed for cough. 120 mL 0   hyoscyamine (ANASPAZ) 0.125 MG TBDP disintergrating tablet Place 1 tablet (0.125 mg total) under the tongue every 4 (four) hours as needed for cramping. 30 tablet 0   KLOR-CON M20 20 MEQ tablet TAKE 2 TABLETS BY MOUTH DAILY 180 tablet 1   lenalidomide (REVLIMID) 25 MG capsule Take 1 capsule (25 mg total) by mouth daily. Take for 14 days on, then 7 days off. Repeat every 21 days. Auth # [id number] Date obtained 07/31/22 (Patient taking differently: Take 25 mg by mouth daily. Take for 14 days on, then 7 days off. Repeat every 21 days. Auth # [id number] Date obtained 07/31/22  first dose 08/09/22) 14  capsule 0   lidocaine (LIDODERM) 5 % Place 1 patch onto the skin daily. Remove & Discard patch within 12 hours or as directed by MD 30 patch 0   metoprolol tartrate (LOPRESSOR) 25 MG tablet ONE HALF TAB BY MOUTH UP TO TWICE DAILY IF NEEDED FOR PALPITATIONS 90 tablet 1   nystatin (MYCOSTATIN) 100000 UNIT/ML suspension Take 5 mLs (500,000 Units total) by mouth 4 (four) times daily. 60 mL 0   olopatadine (PATANOL) 0.1 % ophthalmic solution Place 1 drop into both eyes 2 (two) times daily as needed. 5 mL  PRN   pantoprazole (PROTONIX) 40 MG tablet TAKE 1 TABLET BY MOUTH EVERY DAY 90 tablet 3   triamcinolone cream (KENALOG) 0.1 % Apply 1 Application topically 3 (three) times daily. (Patient taking differently: Apply 1 Application topically 3 (three) times daily. As needed) 30 g 1   traMADol (ULTRAM) 50 MG tablet Take 1 tablet (50 mg total) by mouth every 8 (eight) hours as needed for moderate pain. (Patient not taking: Reported on 08/11/2022) 30 tablet 0   No current facility-administered medications for this visit.    REVIEW OF SYSTEMS:   Constitutional: ( - ) fevers, ( - )  chills , ( - ) night sweats Eyes: ( - ) blurriness of vision, ( - ) double vision, ( - ) watery eyes Ears, nose, mouth, throat, and face: ( - ) mucositis, ( - ) sore throat Respiratory: ( - ) cough, ( - ) dyspnea, ( - ) wheezes Cardiovascular: ( - ) palpitation, ( - ) chest discomfort, ( - ) lower extremity swelling Gastrointestinal:  ( - ) nausea, ( - ) heartburn, ( - ) change in bowel habits Skin: ( - ) abnormal skin rashes Lymphatics: ( - ) new lymphadenopathy, ( - ) easy bruising Neurological: ( - ) numbness, ( - ) tingling, ( - ) new weaknesses Behavioral/Psych: ( - ) mood change, ( - ) new changes  All other systems were reviewed with the patient and are negative.  PHYSICAL EXAMINATION: ECOG PERFORMANCE STATUS: 1 - Symptomatic but completely ambulatory  Vitals:   08/11/22 1352  BP: 128/85  Pulse: 71  Resp: 18   Temp: 97.7 F (36.5 C)  SpO2: 100%     Filed Weights   08/11/22 1352  Weight: 175 lb (79.4 kg)      GENERAL: Well-appearing middle-aged African-American female, alert, no distress and comfortable SKIN: skin color, texture, turgor are normal, no rashes or significant lesions EYES: conjunctiva are pink and non-injected, sclera clear LUNGS: clear to auscultation and percussion with normal breathing effort HEART: regular rate & rhythm and no murmurs and no lower extremity edema Musculoskeletal: no cyanosis of digits and no clubbing  PSYCH: alert & oriented x 3, fluent speech NEURO: no focal motor/sensory deficits  LABORATORY DATA:  I have reviewed the data as listed    Latest Ref Rng & Units 08/11/2022    1:20 PM 08/05/2022    1:19 PM 07/29/2022   11:22 AM  CBC  WBC 4.0 - 10.5 K/uL 5.1  6.6  5.5   Hemoglobin 12.0 - 15.0 g/dL 16.1  09.6  04.5   Hematocrit 36.0 - 46.0 % 32.9  36.0  33.3   Platelets 150 - 400 K/uL 218  236  208        Latest Ref Rng & Units 08/11/2022    1:20 PM 08/05/2022    1:19 PM 07/29/2022   11:22 AM  CMP  Glucose 70 - 99 mg/dL 95  78  88   BUN 6 - 20 mg/dL 10  15  9    Creatinine 0.44 - 1.00 mg/dL 4.09  8.11  9.14   Sodium 135 - 145 mmol/L 140  142  142   Potassium 3.5 - 5.1 mmol/L 3.4  4.1  3.7   Chloride 98 - 111 mmol/L 108  110  109   CO2 22 - 32 mmol/L 24  25  27    Calcium 8.9 - 10.3 mg/dL 8.9  9.3  9.2   Total Protein 6.5 - 8.1 g/dL  6.5  7.0  6.9   Total Bilirubin 0.3 - 1.2 mg/dL 0.4  0.4  0.4   Alkaline Phos 38 - 126 U/L 75  77  71   AST 15 - 41 U/L 11  11  11    ALT 0 - 44 U/L 13  13  15      Lab Results  Component Value Date   MPROTEIN Not Observed 07/29/2022   MPROTEIN Not Observed 07/08/2022   MPROTEIN Not Observed 06/17/2022   Lab Results  Component Value Date   KPAFRELGTCHN 13.9 07/29/2022   KPAFRELGTCHN 15.8 07/08/2022   KPAFRELGTCHN 18.1 06/17/2022   LAMBDASER 10.3 07/29/2022   LAMBDASER 12.6 07/08/2022   LAMBDASER 15.5  06/17/2022   KAPLAMBRATIO 1.35 07/29/2022   KAPLAMBRATIO 1.25 07/08/2022   KAPLAMBRATIO 1.17 06/17/2022     RADIOGRAPHIC STUDIES: No results found.  ASSESSMENT & PLAN Sonya Franklin is a 57 y.o. female with medical history significant for newly diagnosed multiple myeloma with plasmacytoma who presents for a follow up visit.  # Multiple Myeloma with Plasmactyoma --Confirmed with L5 bone lesion biopsy and bone marrow biopsy. --Started VRD therapy on 05/05/2022.  PLAN: --Due for Cycle 5 Day 15 of VRD therapy today.  --Labs today reviewed and adequate for treatment.  Labs show white blood cell count 5.1, hemoglobin 10.9, MCV 85.7, and platelets of 218. Creatinine normal.  --Last myeloma labs from 07/29/2022 showed M protein was undetectable with normal serum free light chains.  --Patient currently has appointment scheduled with [facility] BMT on 08/13/2022 with Dr. Ralene Cork.  --Continue with weekly treatment and q 2 week office visits  #Jitteriness/Palpitations: --Likely secondary to steroid therapy versus abnormal thyroid function, symptoms improving with thyroid management.  --recommend increase steroid back to full dose at 40mg  PO dexamethasone.   #Right lower extremity neuropathy: --Greatly improved --Monitor for now.   #Cancer related pain: --Secondary to focal tumor replacement of the right-side of the L5 vertebral body extending into the right L5 pedicle and posterior elements with a small extraosseous component extending into the right L5 neural foramen with moderate-severe stenosi -- Unable to tolerate opoid medications -- Sent lidocaine patches but did not pick up. Advised to try since she is hesitant to try opoid medications.  -- Under the care of palliative care service. -- Discussed palliative radiation to help relieve pain if systemic therapy is not alleviating pain.  #Supportive Care -- chemotherapy education completed -- port placement not required.  -- zofran  8mg  q8H PRN and compazine 10mg  PO q6H for nausea -- acyclovir 400mg  PO BID for VCZ prophylaxis -- allopurinol 300mg  PO daily for TLS prophylaxis -- Pain medication as noted above  No orders of the defined types were placed in this encounter.  All questions were answered. The patient knows to call the clinic with any problems, questions or concerns.  A total of more than 30 minutes were spent on this encounter with face-to-face time and non-face-to-face time, including preparing to see the patient, ordering tests and/or medications, counseling the patient and coordination of care as outlined above.   Georga Kaufmann PA-C Dept of Hematology and Oncology [facility] at [facility] Phone: [phone]   08/11/2022 4:07 PM

## 2022-08-11 NOTE — Patient Instructions (Signed)
[facility]  Discharge Instructions: Thank you for choosing [facility] to provide your oncology and hematology care.   If you have a lab appointment with the Cancer Center, please go directly to the Cancer Center and check in at the registration area.   Wear comfortable clothing and clothing appropriate for easy access to any Portacath or PICC line.   We strive to give you quality time with your provider. You may need to reschedule your appointment if you arrive late (15 or more minutes).  Arriving late affects you and other patients whose appointments are after yours.  Also, if you miss three or more appointments without notifying the office, you may be dismissed from the clinic at the provider's discretion.      For prescription refill requests, have your pharmacy contact our office and allow 72 hours for refills to be completed.    Today you received the following chemotherapy and/or immunotherapy agents: Velcade     To help prevent nausea and vomiting after your treatment, we encourage you to take your nausea medication as directed.  BELOW ARE SYMPTOMS THAT SHOULD BE REPORTED IMMEDIATELY: *FEVER GREATER THAN 100.4 F (38 C) OR HIGHER *CHILLS OR SWEATING *NAUSEA AND VOMITING THAT IS NOT CONTROLLED WITH YOUR NAUSEA MEDICATION *UNUSUAL SHORTNESS OF BREATH *UNUSUAL BRUISING OR BLEEDING *URINARY PROBLEMS (pain or burning when urinating, or frequent urination) *BOWEL PROBLEMS (unusual diarrhea, constipation, pain near the anus) TENDERNESS IN MOUTH AND THROAT WITH OR WITHOUT PRESENCE OF ULCERS (sore throat, sores in mouth, or a toothache) UNUSUAL RASH, SWELLING OR PAIN  UNUSUAL VAGINAL DISCHARGE OR ITCHING   Items with * indicate a potential emergency and should be followed up as soon as possible or go to the Emergency Department if any problems should occur.  Please show the CHEMOTHERAPY ALERT CARD or IMMUNOTHERAPY ALERT CARD at check-in  to the Emergency Department and triage nurse.  Should you have questions after your visit or need to cancel or reschedule your appointment, please contact [facility]  Dept: [phone]  and follow the prompts.  Office hours are 8:00 a.m. to 4:30 p.m. Monday - Friday. Please note that voicemails left after 4:00 p.m. may not be returned until the following business day.  We are closed weekends and major holidays. You have access to a nurse at all times for urgent questions. Please call the main number to the clinic Dept: [phone] and follow the prompts.   For any non-urgent questions, you may also contact your provider using MyChart. We now offer e-Visits for anyone 18 and older to request care online for non-urgent symptoms. For details visit [email or web address].   Also download the MyChart app! Go to the app store, search "MyChart", open the app, select [employer], and log in with your MyChart username and password.   

## 2022-08-12 ENCOUNTER — Ambulatory Visit: Payer: Medicare Other

## 2022-08-13 ENCOUNTER — Encounter: Payer: Self-pay | Admitting: Endocrinology

## 2022-08-13 ENCOUNTER — Ambulatory Visit: Payer: Medicare Other | Admitting: Endocrinology

## 2022-08-13 VITALS — BP 136/92 | HR 72 | Ht 61.0 in | Wt 175.4 lb

## 2022-08-13 DIAGNOSIS — C9000 Multiple myeloma not having achieved remission: Secondary | ICD-10-CM | POA: Diagnosis not present

## 2022-08-13 DIAGNOSIS — R002 Palpitations: Secondary | ICD-10-CM | POA: Diagnosis not present

## 2022-08-13 DIAGNOSIS — E042 Nontoxic multinodular goiter: Secondary | ICD-10-CM | POA: Diagnosis not present

## 2022-08-13 DIAGNOSIS — R7989 Other specified abnormal findings of blood chemistry: Secondary | ICD-10-CM | POA: Diagnosis not present

## 2022-08-13 LAB — TSH: TSH: 0.04 u[IU]/mL — ABNORMAL LOW (ref 0.35–5.50)

## 2022-08-13 LAB — T3, FREE: T3, Free: 3.3 pg/mL (ref 2.3–4.2)

## 2022-08-13 LAB — T4, FREE: Free T4: 1.07 ng/dL (ref 0.60–1.60)

## 2022-08-13 NOTE — Progress Notes (Signed)
Reason for Appointment: Abnormal thyroid levels, follow-up consultation    History of Present Illness:   The patient's thyroid abnormality was first discovered in 02/2009  At that time she had an episode of shakiness and palpitations but no other symptoms of weight loss or sweating Although her TSH was 0.03 her free T4 and T3 were normal at that time Her symptoms resolved spontaneously and when she was seen in consultation about 2 months later her TSH was improving at 0.12 and her free T4 and T3 were also normal She was told to follow-up but she did not do so  She is now being referred here for repeat consultations since her TSH in February and March were again low 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 when she was last seen in 2020 TSH last year was normal in January  She has complaints of palpitations since about 2/24 and she feels that these started right after her infusions for the multiple myeloma treatment and was told by oncologist that this drug would affect her thyroid She also feels shaky but has no sweating, heat intolerance or weight loss  Although her TSH was 0.01 in March her T4 level was normal, no T3 level available  Wt Readings from Last 3 Encounters:  08/13/22 175 lb 6.4 oz (79.6 kg)  08/11/22 175 lb (79.4 kg)  08/05/22 175 lb 4 oz (79.5 kg)     Lab Results  Component Value Date   FREET4 0.97 05/10/2022   FREET4 1.1 10/30/2021   FREET4 0.87 10/19/2018   TSH 0.01 (L) 05/25/2022   TSH 0.199 (L) 05/10/2022   TSH 0.57 04/08/2021    She has had a thyroid scan in 02/2009 which showed a thyroid enlargement especially on the right side and heterogenous uptake and scattered areas of increased and decreased uptake along with a 24-hour uptake of 34.7  She has not had a thyroid ultrasound   Allergies as of 08/13/2022       Reactions   Shellfish Allergy Anaphylaxis   Blue Dyes (parenteral)    Hydrochlorothiazide Itching        Medication List         Accurate as of Aug 13, 2022  9:11 AM. If you have any questions, ask your nurse or doctor.          acyclovir 400 MG tablet Commonly known as: ZOVIRAX TAKE 1 TABLET BY MOUTH TWICE A DAY   amLODipine 5 MG tablet Commonly known as: NORVASC TAKE 1 TABLET (5 MG TOTAL) BY MOUTH DAILY.   aspirin EC 81 MG tablet Take 81 mg by mouth daily. Swallow whole.   furosemide 20 MG tablet Commonly known as: LASIX Take 1 tablet (20 mg total) by mouth daily as needed.   HYDROcodone bit-homatropine 5-1.5 MG/5ML syrup Commonly known as: HYCODAN Take 5 mLs by mouth every 8 (eight) hours as needed for cough.   hyoscyamine 0.125 MG Tbdp disintergrating tablet Commonly known as: ANASPAZ Place 1 tablet (0.125 mg total) under the tongue every 4 (four) hours as needed for cramping.   Klor-Con M20 20 MEQ tablet Generic drug: potassium chloride SA TAKE 2 TABLETS BY MOUTH DAILY   lenalidomide 25 MG capsule Commonly known as: REVLIMID Take 1 capsule (25 mg total) by mouth daily. Take for 14 days on, then 7 days off. Repeat every 21 days. Auth # [id number] Date obtained 07/31/22 What changed: additional instructions   lidocaine 5 % Commonly known as: LIDODERM Place 1 patch  onto the skin daily. Remove & Discard patch within 12 hours or as directed by MD   metoprolol tartrate 25 MG tablet Commonly known as: LOPRESSOR ONE HALF TAB BY MOUTH UP TO TWICE DAILY IF NEEDED FOR PALPITATIONS   nystatin 100000 UNIT/ML suspension Commonly known as: MYCOSTATIN Take 5 mLs (500,000 Units total) by mouth 4 (four) times daily.   olopatadine 0.1 % ophthalmic solution Commonly known as: Patanol Place 1 drop into both eyes 2 (two) times daily as needed.   pantoprazole 40 MG tablet Commonly known as: PROTONIX TAKE 1 TABLET BY MOUTH EVERY DAY   traMADol 50 MG tablet Commonly known as: ULTRAM Take 1 tablet (50 mg total) by mouth every 8 (eight) hours as needed for moderate pain.   triamcinolone cream  0.1 % Commonly known as: KENALOG Apply 1 Application topically 3 (three) times daily. What changed: additional instructions        Allergies:  Allergies  Allergen Reactions   Shellfish Allergy Anaphylaxis   Blue Dyes (Parenteral)    Hydrochlorothiazide Itching    Past Medical History:  Diagnosis Date   Ankylosing spondylitis (HCC)    Anxiety    Arthritis    Breast cancer (HCC)    right breast   Bursitis of hip, right    Cardiac arrhythmia    Carotid stenosis 08/26/2021   GE reflux    Hiatal hernia    Hypertension    Hyperthyroidism    Ischemic bowel disease (HCC)    Multiple myeloma not having achieved remission (HCC)    Prediabetes 08/26/2021   Snoring 03/21/2014   Vitamin D deficiency     Past Surgical History:  Procedure Laterality Date   ABDOMINAL HYSTERECTOMY     BREAST LUMPECTOMY     right   MASTECTOMY     double    Family History  Problem Relation Age of Onset   Diabetes Mother    Hypertension Mother    Hyperlipidemia Mother    High blood pressure Father    High Cholesterol Father    Colitis Brother    Goiter Maternal Grandmother    Breast cancer Maternal Aunt     Social History:  reports that she has never smoked. She has never used smokeless tobacco. She reports that she does not drink alcohol and does not use drugs.   Review of Systems  No recent weight loss  She is on treatment for hypertension  Currently on chemotherapy for multiple myeloma and is reportedly waiting for transplant  No chest pain recently   Examination:   BP (!) 136/92   Pulse 72   Ht 5\' 1"  (1.549 m)   Wt 175 lb 6.4 oz (79.6 kg)   SpO2 96%   BMI 33.14 kg/m    General Appearance: pleasant, well built and nourished Does not look anxious or hyperkinetic  The thyroid is nonpalpable.  Heart rate is regular, about 74, showed ejection murmur at the left sternal border  Minimal fine tremor present  Biceps reflexes are slightly  brisk  Assessment/Plan:  Multinodular goiter with periodically autonomous function  She does not have a palpable goiter but her scan previously indicated a mild enlargement of the right lobe and heterogenous uptake typical of multinodular goiter She also has a family history of goiter and autoimmune thyroid disease  She has no palpable thyroid enlargement again and no tachycardia currently Unclear whether her subclinical hyperthyroidism related to her chemotherapy, she has intermittent symptoms and no systemic symptoms like weight loss  Thyroid function will be rechecked today including free T4 and T3  Also check thyrotropin receptor antibody because of family history of autoimmune disease  If they are normal she will follow-up with her PCP   Reather Littler 08/13/2022

## 2022-08-14 ENCOUNTER — Other Ambulatory Visit: Payer: Self-pay | Admitting: Internal Medicine

## 2022-08-15 LAB — THYROTROPIN RECEPTOR AUTOABS: Thyrotropin Receptor Ab: <1.1 IU/L (ref 0.00–1.75)

## 2022-08-18 NOTE — Progress Notes (Unsigned)
Palliative Medicine [facility]  [phone] [phone]   Name: Sonya Franklin Date: 08/18/2022 MRN: [id number]  DOB: 30-Jul-1965  Patient Care Team: Margaree Mackintosh, MD as PCP - General (Internal Medicine)    INTERVAL HISTORY: Sonya Franklin is a 57 y.o. 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 Palliative ask to see for symptom management and goals of care.   SOCIAL HISTORY:     reports that she has never smoked. She has never used smokeless tobacco. She reports that she does not drink alcohol and does not use drugs.  ADVANCE DIRECTIVES:  None on file  CODE STATUS: Full code  PAST MEDICAL HISTORY: Past Medical History:  Diagnosis Date   Ankylosing spondylitis (HCC)    Anxiety    Arthritis    Breast cancer (HCC)    right breast   Bursitis of hip, right    Cardiac arrhythmia    Carotid stenosis 08/26/2021   GE reflux    Hiatal hernia    Hypertension    Hyperthyroidism    Ischemic bowel disease (HCC)    Multiple myeloma not having achieved remission (HCC)    Prediabetes 08/26/2021   Snoring 03/21/2014   Vitamin D deficiency     ALLERGIES:  is allergic to shellfish allergy, blue dyes (parenteral), and hydrochlorothiazide.  MEDICATIONS:  Current Outpatient Medications  Medication Sig Dispense Refill   acyclovir (ZOVIRAX) 400 MG tablet TAKE 1 TABLET BY MOUTH TWICE A DAY 180 tablet 1   amLODipine (NORVASC) 5 MG tablet TAKE 1 TABLET (5 MG TOTAL) BY MOUTH DAILY. 90 tablet 2   aspirin EC 81 MG tablet Take 81 mg by mouth daily. Swallow whole.     furosemide (LASIX) 20 MG tablet TAKE 1 TABLET BY MOUTH EVERY DAY AS NEEDED 30 tablet 1   HYDROcodone bit-homatropine (HYCODAN) 5-1.5 MG/5ML syrup Take 5 mLs by mouth every 8 (eight) hours as needed for cough. 120 mL 0   hyoscyamine (ANASPAZ) 0.125 MG TBDP disintergrating tablet Place 1  tablet (0.125 mg total) under the tongue every 4 (four) hours as needed for cramping. 30 tablet 0   KLOR-CON M20 20 MEQ tablet TAKE 2 TABLETS BY MOUTH DAILY 180 tablet 1   lenalidomide (REVLIMID) 25 MG capsule Take 1 capsule (25 mg total) by mouth daily. Take for 14 days on, then 7 days off. Repeat every 21 days. Auth # [id number] Date obtained 07/31/22 (Patient taking differently: Take 25 mg by mouth daily. Take for 14 days on, then 7 days off. Repeat every 21 days. Auth # [id number] Date obtained 07/31/22  first dose 08/09/22) 14 capsule 0   lidocaine (LIDODERM) 5 % Place 1 patch onto the skin daily. Remove & Discard patch within 12 hours or as directed by MD 30 patch 0   metoprolol tartrate (LOPRESSOR) 25 MG tablet ONE HALF TAB BY MOUTH UP TO TWICE DAILY IF NEEDED FOR PALPITATIONS 90 tablet 1   nystatin (MYCOSTATIN) 100000 UNIT/ML suspension Take 5 mLs (500,000 Units total) by mouth 4 (four) times daily. 60 mL 0   olopatadine (PATANOL) 0.1 % ophthalmic solution Place 1 drop into both eyes 2 (two) times daily as needed. 5 mL PRN   pantoprazole (PROTONIX) 40 MG tablet TAKE 1 TABLET BY MOUTH EVERY DAY 90 tablet 3   traMADol (ULTRAM) 50 MG tablet Take 1 tablet (50 mg total) by mouth every 8 (eight) hours as  needed for moderate pain. (Patient not taking: Reported on 08/11/2022) 30 tablet 0   triamcinolone cream (KENALOG) 0.1 % Apply 1 Application topically 3 (three) times daily. (Patient taking differently: Apply 1 Application topically 3 (three) times daily. As needed) 30 g 1   No current facility-administered medications for this visit.    VITAL SIGNS: There were no vitals taken for this visit. There were no vitals filed for this visit.  Estimated body mass index is 33.14 kg/m as calculated from the following:   Height as of 08/13/22: 5\' 1"  (1.549 m).   Weight as of 08/13/22: 175 lb 6.4 oz (79.6 kg).   PERFORMANCE STATUS (ECOG) : 1 - Symptomatic but completely ambulatory   Physical Exam General:  NAD Cardiovascular: regular rate and rhythm Pulmonary: normal breathing pattern  Extremities: no edema, no joint deformities Skin: no rashes Neurological: AAO x4  IMPRESSION: I saw Sonya Franklin during infusion. No acute distress. Tolerating treatment. Is trying to remain as active as possible. Denies nausea, vomiting, constipation, or diarrhea.  Pain Patient endorses ongoing arthritic and back pain.  She is trying her best to manage however some days are better than others.  She is not interested in taking oral pain medication at this time.  We discussed use of nonpharmacological ways to try to manage her pain.  Patient understands we are available as needed.    PLAN:   Extensive discussion regarding nonpharmacological methods to assist with her pain and discomfort.  Education also provided on use of oral medications.  Patient verbalized understanding and knows to contact office as needed. I will plan to see patient back in 6-8 weeks in collaboration to other oncology appointments.    Patient expressed understanding and was in agreement with this plan. She also understands that She can call the clinic at any time with any questions, concerns, or complaints.   Any controlled substances utilized were prescribed in the context of palliative care. PDMP has been reviewed.    Visit consisted of counseling and education dealing with the complex and emotionally intense issues of symptom management and palliative care in the setting of serious and potentially life-threatening illness.Greater than 50%  of this time was spent counseling and coordinating care related to the above assessment and plan.  [name], AGPCNP-BC  Palliative Medicine Team/[facility]  *Please note that this is a verbal dictation therefore any spelling or grammatical errors are due to the "[employer]agon [employer] One" system interpretation.

## 2022-08-19 ENCOUNTER — Other Ambulatory Visit: Payer: Self-pay

## 2022-08-19 ENCOUNTER — Inpatient Hospital Stay: Payer: Medicare Other | Attending: Physician Assistant

## 2022-08-19 ENCOUNTER — Inpatient Hospital Stay: Payer: Medicare Other

## 2022-08-19 ENCOUNTER — Inpatient Hospital Stay (HOSPITAL_BASED_OUTPATIENT_CLINIC_OR_DEPARTMENT_OTHER): Payer: Medicare Other | Admitting: Physician Assistant

## 2022-08-19 ENCOUNTER — Encounter: Payer: Self-pay | Admitting: Nurse Practitioner

## 2022-08-19 ENCOUNTER — Inpatient Hospital Stay (HOSPITAL_BASED_OUTPATIENT_CLINIC_OR_DEPARTMENT_OTHER): Payer: Medicare Other | Admitting: Nurse Practitioner

## 2022-08-19 VITALS — BP 124/64 | HR 81 | Temp 98.3°F | Resp 17

## 2022-08-19 VITALS — BP 140/77 | HR 71 | Temp 97.3°F | Resp 20 | Wt 178.5 lb

## 2022-08-19 DIAGNOSIS — C9000 Multiple myeloma not having achieved remission: Secondary | ICD-10-CM | POA: Diagnosis not present

## 2022-08-19 DIAGNOSIS — G893 Neoplasm related pain (acute) (chronic): Secondary | ICD-10-CM

## 2022-08-19 DIAGNOSIS — Z5112 Encounter for antineoplastic immunotherapy: Secondary | ICD-10-CM | POA: Diagnosis not present

## 2022-08-19 DIAGNOSIS — Z515 Encounter for palliative care: Secondary | ICD-10-CM

## 2022-08-19 DIAGNOSIS — M899 Disorder of bone, unspecified: Secondary | ICD-10-CM

## 2022-08-19 LAB — CBC WITH DIFFERENTIAL (CANCER CENTER ONLY)
Abs Immature Granulocytes: 0.04 10*3/uL (ref 0.00–0.07)
Basophils Absolute: 0 10*3/uL (ref 0.0–0.1)
Basophils Relative: 0 %
Eosinophils Absolute: 0.3 10*3/uL (ref 0.0–0.5)
Eosinophils Relative: 6 %
HCT: 33.2 % — ABNORMAL LOW (ref 36.0–46.0)
Hemoglobin: 10.9 g/dL — ABNORMAL LOW (ref 12.0–15.0)
Immature Granulocytes: 1 %
Lymphocytes Relative: 17 %
Lymphs Abs: 0.9 10*3/uL (ref 0.7–4.0)
MCH: 28.6 pg (ref 26.0–34.0)
MCHC: 32.8 g/dL (ref 30.0–36.0)
MCV: 87.1 fL (ref 80.0–100.0)
Monocytes Absolute: 0.7 10*3/uL (ref 0.1–1.0)
Monocytes Relative: 13 %
Neutro Abs: 3.4 10*3/uL (ref 1.7–7.7)
Neutrophils Relative %: 63 %
Platelet Count: 221 10*3/uL (ref 150–400)
RBC: 3.81 MIL/uL — ABNORMAL LOW (ref 3.87–5.11)
RDW: 15.3 % (ref 11.5–15.5)
WBC Count: 5.5 10*3/uL (ref 4.0–10.5)
nRBC: 0 % (ref 0.0–0.2)

## 2022-08-19 LAB — CMP (CANCER CENTER ONLY)
ALT: 21 U/L (ref 0–44)
AST: 16 U/L (ref 15–41)
Albumin: 4.2 g/dL (ref 3.5–5.0)
Alkaline Phosphatase: 76 U/L (ref 38–126)
Anion gap: 8 (ref 5–15)
BUN: 14 mg/dL (ref 6–20)
CO2: 27 mmol/L (ref 22–32)
Calcium: 9.5 mg/dL (ref 8.9–10.3)
Chloride: 106 mmol/L (ref 98–111)
Creatinine: 0.72 mg/dL (ref 0.44–1.00)
GFR, Estimated: >60 mL/min (ref >60)
Glucose, Bld: 86 mg/dL (ref 70–99)
Potassium: 3.7 mmol/L (ref 3.5–5.1)
Sodium: 141 mmol/L (ref 135–145)
Total Bilirubin: 0.4 mg/dL (ref 0.3–1.2)
Total Protein: 6.8 g/dL (ref 6.5–8.1)

## 2022-08-19 MED ORDER — BORTEZOMIB CHEMO SQ INJECTION 3.5 MG (2.5MG/ML)
1.3000 mg/m2 | Freq: Once | INTRAMUSCULAR | Status: AC
  Administered 2022-08-19: 2.5 mg via SUBCUTANEOUS
  Filled 2022-08-19: qty 1

## 2022-08-19 MED ORDER — SODIUM CHLORIDE 0.9 % IV SOLN: INTRAVENOUS | Status: AC

## 2022-08-19 MED ORDER — DEXAMETHASONE 4 MG PO TABS
20.0000 mg | ORAL_TABLET | Freq: Once | ORAL | Status: AC
  Administered 2022-08-19: 20 mg via ORAL
  Filled 2022-08-19: qty 5

## 2022-08-19 NOTE — Patient Instructions (Signed)
[facility] [facility]  Discharge Instructions: Thank you for choosing [facility] to provide your oncology and hematology care.   If you have a lab appointment with the Cancer Center, please go directly to the Cancer Center and check in at the registration area.   Wear comfortable clothing and clothing appropriate for easy access to any Portacath or PICC line.   We strive to give you quality time with your provider. You may need to reschedule your appointment if you arrive late (15 or more minutes).  Arriving late affects you and other patients whose appointments are after yours.  Also, if you miss three or more appointments without notifying the office, you may be dismissed from the clinic at the provider's discretion.      For prescription refill requests, have your pharmacy contact our office and allow 72 hours for refills to be completed.    Today you received the following chemotherapy and/or immunotherapy agents : Velcade      To help prevent nausea and vomiting after your treatment, we encourage you to take your nausea medication as directed.  BELOW ARE SYMPTOMS THAT SHOULD BE REPORTED IMMEDIATELY: *FEVER GREATER THAN 100.4 F (38 C) OR HIGHER *CHILLS OR SWEATING *NAUSEA AND VOMITING THAT IS NOT CONTROLLED WITH YOUR NAUSEA MEDICATION *UNUSUAL SHORTNESS OF BREATH *UNUSUAL BRUISING OR BLEEDING *URINARY PROBLEMS (pain or burning when urinating, or frequent urination) *BOWEL PROBLEMS (unusual diarrhea, constipation, pain near the anus) TENDERNESS IN MOUTH AND THROAT WITH OR WITHOUT PRESENCE OF ULCERS (sore throat, sores in mouth, or a toothache) UNUSUAL RASH, SWELLING OR PAIN  UNUSUAL VAGINAL DISCHARGE OR ITCHING   Items with * indicate a potential emergency and should be followed up as soon as possible or go to the Emergency Department if any problems should occur.  Please show the CHEMOTHERAPY ALERT CARD or IMMUNOTHERAPY ALERT CARD at  check-in to the Emergency Department and triage nurse.  Should you have questions after your visit or need to cancel or reschedule your appointment, please contact [place] [facility]  Dept: [phone]  and follow the prompts.  Office hours are 8:00 a.m. to 4:30 p.m. Monday - Friday. Please note that voicemails left after 4:00 p.m. may not be returned until the following business day.  We are closed weekends and major holidays. You have access to a nurse at all times for urgent questions. Please call the main number to the clinic Dept: [phone] and follow the prompts.   For any non-urgent questions, you may also contact your provider using MyChart. We now offer e-Visits for anyone 60 and older to request care online for non-urgent symptoms. For details visit [email or web address].   Also download the MyChart app! Go to the app store, search "MyChart", open the app, select [place], and log in with your MyChart username and password.  Dehydration, Adult Dehydration is a condition in which there is not enough water or other fluids in the body. This happens when a person loses more fluids than they take in. Important organs cannot work right without the right amount of fluids. Any loss of fluids from the body can cause dehydration. Dehydration can be mild, worse, or very bad. It should be treated right away to keep it from getting very bad. What are the causes? Conditions that cause loss of water in the body. They include: Watery poop (diarrhea). Vomiting. Sweating a lot. Fever. Infection. Peeing (urinating) a lot. Not drinking enough fluids.  Certain medicines, such as medicines that take extra fluid out of the body (diuretics). Lack of safe drinking water. Not being able to get enough water and food. What increases the risk? Having a long-term (chronic) illness that has not been treated the right way, such as: Diabetes. Heart disease. Kidney  disease. Being 17 years of age or older. Having a disability. Living in a place that is high above the ground or sea (high in altitude). The thinner, drier air causes more fluid loss. Doing exercises that put stress on your body for a long time. Being active when in hot places. What are the signs or symptoms? Symptoms of dehydration depend on how bad it is. Mild or worse dehydration Thirst. Dry lips or dry mouth. Feeling dizzy or light-headed. Muscle cramps. Passing little pee or dark pee. Pee may be the color of tea. Headache. Very bad dehydration Changes in skin. Skin may: Be cold to the touch (clammy). Be blotchy or pale. Not go back to normal right after you pinch it and let it go. Little or no tears, pee, or sweat. Fast breathing. Low blood pressure. Weak pulse. Pulse that is more than 100 beats a minute when you are sitting still. Other changes, such as: Feeling very thirsty. Eyes that look hollow (sunken). Cold hands and feet. Being confused. Being very tired (lethargic) or having trouble waking from sleep. Losing weight. Loss of consciousness. How is this treated? Treatment for this condition depends on how bad your dehydration is. Treatment should start right away. Do not wait until your condition gets very bad. Very bad dehydration is an emergency. You will need to go to a hospital. Mild or worse dehydration can be treated at home. You may be asked to: Drink more fluids. Drink an oral rehydration solution (ORS). This drink gives you the right amount of fluids, salts, and minerals (electrolytes). Very bad dehydration can be treated: With fluids through an IV tube. By correcting low levels of electrolytes in the body. By treating the problem that caused your dehydration. Follow these instructions at home: Oral rehydration solution If told by your doctor, drink an ORS: Make an ORS. Use instructions on the package. Start by drinking small amounts, about  cup (120  mL) every 5-10 minutes. Slowly drink more until you have had the amount that your doctor said to have.  Eating and drinking  Drink enough clear fluid to keep your pee pale yellow. If you were told to drink an ORS, finish the ORS first. Then, start slowly drinking other clear fluids. Drink fluids such as: Water. Do not drink only water. Doing that can make the salt (sodium) level in your body get too low. Water from ice chips you suck on. Fruit juice that you have added water to (diluted). Low-calorie sports drinks. Eat foods that have the right amounts of salts and minerals, such as bananas, oranges, potatoes, tomatoes, or spinach. Do not drink alcohol. Avoid drinks that have caffeine or sugar. These include:: High-calorie sports drinks. Fruit juice that you did not add water to. Soda. Coffee or energy drinks. Avoid foods that are greasy or have a lot of fat or sugar. General instructions Take over-the-counter and prescription medicines only as told by your doctor. Do not take sodium tablets. Doing that can make the salt level in your body get too high. Return to your normal activities as told by your doctor. Ask your doctor what activities are safe for you. Keep all follow-up visits. Your doctor may check  and change your treatment. Contact a doctor if: You have pain in your belly (abdomen) and the pain: Gets worse. Stays in one place. You have a rash. You have a stiff neck. You get angry or annoyed more easily than normal. You are more tired or have a harder time waking than normal. You feel weak or dizzy. You feel very thirsty. Get help right away if: You have any symptoms of very bad dehydration. You vomit every time you eat or drink. Your vomiting gets worse, does not go away, or you vomit blood or green stuff. You are getting treatment, but symptoms are getting worse. You have a fever. You have a very bad headache. You have: Diarrhea that gets worse or does not go  away. Blood in your poop (stool). This may cause poop to look black and tarry. No pee in 6-8 hours. Only a small amount of pee in 6-8 hours, and the pee is very dark. You have trouble breathing. These symptoms may be an emergency. Get help right away. Call 911. Do not wait to see if the symptoms will go away. Do not drive yourself to the hospital. This information is not intended to replace advice given to you by your health care provider. Make sure you discuss any questions you have with your health care provider. Document Revised: 09/29/2021 Document Reviewed: 09/29/2021 Elsevier Patient Education  2024 [employer].

## 2022-08-19 NOTE — Progress Notes (Signed)
[facility] [phone]   [phone]  PROGRESS NOTE  Patient Care Team: Margaree Mackintosh, MD as PCP - General (Internal Medicine)  Hematological/Oncological History # Multiple Myeloma with Plasmacytoma 03/03/2022: established with the [facility] in Oncology  04/15/2022: CT biopsy and bone marrow biopsy confirmed the presence of a plasmacytoma and bone marrow involvement of multiple myeloma.  05/05/2022: Cycle 1 Day 1 of VRD therapy 05/27/2022: Cycle 2 Day 1 of VRD therapy 06/17/2022: Cycle 3 Day 1 of VRD therapy 07/08/2022: Cycle 4 Day 1 of VRD therapy 07/29/2022: Cycle 5 Day 1 of VRD therapy 08/19/2022: Cycle 6 Day 1 of VRD therapy  Interval History:  Sonya Franklin 57 y.o. female with medical history significant for recently diagnosed multiple myeloma with plasmacytoma who presents for a follow up visit. The patient's last visit was on 08/11/2022. She presents today to start Cycle 6, Day 1 of VRD therapy.   On exam today [name] reports having more low back/hip pain compared to her baseline. She reports that the lidocaine patch and tylenol are not providing her any relief. She is not interested in taking opoid medication either. She reports her energy is stable with intermittent episodes of fatigue. Her weight and appetite are stable. She denies nausea, vomiting or abdominal pain. Bowel habits are unchanged without recurrent episodes of diarrhea or constipation. She denies easy bruising or signs of bleeding. She denies neuropathy in her fingers or toes. She denies fevers, chills, sweats, shortness of breath, chest pain or cough. She has no other complaints. Rest of the 10 point ROS is below.   MEDICAL HISTORY:  Past Medical History:  Diagnosis Date   Ankylosing spondylitis (HCC)    Anxiety    Arthritis    Breast cancer (HCC)    right breast   Bursitis of hip, right    Cardiac arrhythmia    Carotid stenosis 08/26/2021   GE reflux    Hiatal hernia     Hypertension    Hyperthyroidism    Ischemic bowel disease (HCC)    Multiple myeloma not having achieved remission (HCC)    Prediabetes 08/26/2021   Snoring 03/21/2014   Vitamin D deficiency     SURGICAL HISTORY: Past Surgical History:  Procedure Laterality Date   ABDOMINAL HYSTERECTOMY     BREAST LUMPECTOMY     right   MASTECTOMY     double    SOCIAL HISTORY: Social History   Socioeconomic History   Marital status: Divorced    Spouse name: Not on file   Number of children: 1   Years of education: Not on file   Highest education level: Not on file  Occupational History    Employer: UNEMPLOYED  Tobacco Use   Smoking status: Never   Smokeless tobacco: Never  Vaping Use   Vaping Use: Never used  Substance and Sexual Activity   Alcohol use: No   Drug use: No   Sexual activity: Yes  Other Topics Concern   Not on file  Social History Narrative   Caffeine occasional drinker.   Not working/disabled.  12th grader. Divorced, one kids.       Social history: She receives disability benefits for history of breast cancer 2007 and ankylosing spondylitis.  She resides with special needs son.  She is divorced.  Non-smoker.  No alcohol consumption.       Family history: Father with history of prostate cancer.  Patient's mother was diagnosed with breast cancer at age 81.  Maternal aunt with history of breast cancer.  3 brothers.   Right handed   Caffeine occas   One level stays on this level       Social Determinants of Health   Financial Resource Strain: Low Risk  (08/26/2021)   Overall Financial Resource Strain (CARDIA)    Difficulty of Paying Living Expenses: Not hard at all  Food Insecurity: No Food Insecurity (05/25/2022)   Hunger Vital Sign    Worried About Running Out of Food in the Last Year: Never true    Ran Out of Food in the Last Year: Never true  Transportation Needs: No Transportation Needs (08/26/2021)   PRAPARE - Administrator, Civil Service  (Medical): No    Lack of Transportation (Non-Medical): No  Physical Activity: Inactive (08/26/2021)   Exercise Vital Sign    Days of Exercise per Week: 0 days    Minutes of Exercise per Session: 0 min  Stress: Not on file  Social Connections: Not on file  Intimate Partner Violence: Not At Risk (04/21/2022)   Humiliation, Afraid, Rape, and Kick questionnaire    Fear of Current or Ex-Partner: No    Emotionally Abused: No    Physically Abused: No    Sexually Abused: No    FAMILY HISTORY: Family History  Problem Relation Age of Onset   Thyroid disease Mother    Diabetes Mother    Hypertension Mother    Hyperlipidemia Mother    High blood pressure Father    High Cholesterol Father    Colitis Brother    Goiter Maternal Grandmother    Breast cancer Maternal Aunt     ALLERGIES:  is allergic to shellfish allergy, blue dyes (parenteral), and hydrochlorothiazide.  MEDICATIONS:  Current Outpatient Medications  Medication Sig Dispense Refill   acyclovir (ZOVIRAX) 400 MG tablet TAKE 1 TABLET BY MOUTH TWICE A DAY 180 tablet 1   amLODipine (NORVASC) 5 MG tablet TAKE 1 TABLET (5 MG TOTAL) BY MOUTH DAILY. 90 tablet 2   aspirin EC 81 MG tablet Take 81 mg by mouth daily. Swallow whole.     furosemide (LASIX) 20 MG tablet TAKE 1 TABLET BY MOUTH EVERY DAY AS NEEDED 30 tablet 1   HYDROcodone bit-homatropine (HYCODAN) 5-1.5 MG/5ML syrup Take 5 mLs by mouth every 8 (eight) hours as needed for cough. 120 mL 0   hyoscyamine (ANASPAZ) 0.125 MG TBDP disintergrating tablet Place 1 tablet (0.125 mg total) under the tongue every 4 (four) hours as needed for cramping. 30 tablet 0   KLOR-CON M20 20 MEQ tablet TAKE 2 TABLETS BY MOUTH DAILY 180 tablet 1   lenalidomide (REVLIMID) 25 MG capsule Take 1 capsule (25 mg total) by mouth daily. Take for 14 days on, then 7 days off. Repeat every 21 days. Auth # [id number] Date obtained 07/31/22 (Patient taking differently: Take 25 mg by mouth daily. Take for 14 days on, then  7 days off. Repeat every 21 days. Auth # [id number] Date obtained 07/31/22  first dose 08/09/22) 14 capsule 0   lidocaine (LIDODERM) 5 % Place 1 patch onto the skin daily. Remove & Discard patch within 12 hours or as directed by MD 30 patch 0   metoprolol tartrate (LOPRESSOR) 25 MG tablet ONE HALF TAB BY MOUTH UP TO TWICE DAILY IF NEEDED FOR PALPITATIONS 90 tablet 1   nystatin (MYCOSTATIN) 100000 UNIT/ML suspension Take 5 mLs (500,000 Units total) by mouth 4 (four) times daily. 60 mL 0   olopatadine (PATANOL) 0.1 %  ophthalmic solution Place 1 drop into both eyes 2 (two) times daily as needed. 5 mL PRN   pantoprazole (PROTONIX) 40 MG tablet TAKE 1 TABLET BY MOUTH EVERY DAY 90 tablet 3   traMADol (ULTRAM) 50 MG tablet Take 1 tablet (50 mg total) by mouth every 8 (eight) hours as needed for moderate pain. 30 tablet 0   triamcinolone cream (KENALOG) 0.1 % Apply 1 Application topically 3 (three) times daily. (Patient taking differently: Apply 1 Application topically 3 (three) times daily. As needed) 30 g 1   No current facility-administered medications for this visit.   Facility-Administered Medications Ordered in Other Visits  Medication Dose Route Frequency Provider Last Rate Last Admin   0.9 %  sodium chloride infusion   Intravenous Continuous Ulysees Barns IV, MD 500 mL/hr at 08/19/22 1106 New Bag at 08/19/22 1106   bortezomib SQ (VELCADE) chemo injection (2.5mg /mL concentration) 2.5 mg  1.3 mg/m2 (Treatment Plan Recorded) Subcutaneous Once Jaci Standard, MD        REVIEW OF SYSTEMS:   Constitutional: ( - ) fevers, ( - )  chills , ( - ) night sweats Eyes: ( - ) blurriness of vision, ( - ) double vision, ( - ) watery eyes Ears, nose, mouth, throat, and face: ( - ) mucositis, ( - ) sore throat Respiratory: ( - ) cough, ( - ) dyspnea, ( - ) wheezes Cardiovascular: ( - ) palpitation, ( - ) chest discomfort, ( + ) lower extremity swelling Gastrointestinal:  ( - ) nausea, ( - ) heartburn, ( - )  change in bowel habits Skin: ( - ) abnormal skin rashes Lymphatics: ( - ) new lymphadenopathy, ( - ) easy bruising Neurological: ( - ) numbness, ( - ) tingling, ( - ) new weaknesses Behavioral/Psych: ( - ) mood change, ( - ) new changes  All other systems were reviewed with the patient and are negative.  PHYSICAL EXAMINATION: ECOG PERFORMANCE STATUS: 1 - Symptomatic but completely ambulatory  Vitals:   08/19/22 1000  BP: (!) 140/77  Pulse: 71  Resp: 20  Temp: (!) 97.3 F (36.3 C)  SpO2: 100%     Filed Weights   08/19/22 1000  Weight: 178 lb 8 oz (81 kg)    GENERAL: Well-appearing middle-aged African-American female, alert, no distress and comfortable SKIN: skin color, texture, turgor are normal, no rashes or significant lesions EYES: conjunctiva are pink and non-injected, sclera clear LUNGS: clear to auscultation and percussion with normal breathing effort HEART: regular rate & rhythm and no murmurs. Mild bilateral lower extremity edema Musculoskeletal: no cyanosis of digits and no clubbing  PSYCH: alert & oriented x 3, fluent speech NEURO: no focal motor/sensory deficits  LABORATORY DATA:  I have reviewed the data as listed    Latest Ref Rng & Units 08/19/2022    9:26 AM 08/11/2022    1:20 PM 08/05/2022    1:19 PM  CBC  WBC 4.0 - 10.5 K/uL 5.5  5.1  6.6   Hemoglobin 12.0 - 15.0 g/dL 16.1  09.6  04.5   Hematocrit 36.0 - 46.0 % 33.2  32.9  36.0   Platelets 150 - 400 K/uL 221  218  236        Latest Ref Rng & Units 08/19/2022    9:26 AM 08/11/2022    1:20 PM 08/05/2022    1:19 PM  CMP  Glucose 70 - 99 mg/dL 86  95  78   BUN 6 -  20 mg/dL 14  10  15    Creatinine 0.44 - 1.00 mg/dL 1.47  8.29  5.62   Sodium 135 - 145 mmol/L 141  140  142   Potassium 3.5 - 5.1 mmol/L 3.7  3.4  4.1   Chloride 98 - 111 mmol/L 106  108  110   CO2 22 - 32 mmol/L 27  24  25    Calcium 8.9 - 10.3 mg/dL 9.5  8.9  9.3   Total Protein 6.5 - 8.1 g/dL 6.8  6.5  7.0   Total Bilirubin 0.3 - 1.2  mg/dL 0.4  0.4  0.4   Alkaline Phos 38 - 126 U/L 76  75  77   AST 15 - 41 U/L 16  11  11    ALT 0 - 44 U/L 21  13  13      Lab Results  Component Value Date   MPROTEIN Not Observed 07/29/2022   MPROTEIN Not Observed 07/08/2022   MPROTEIN Not Observed 06/17/2022   Lab Results  Component Value Date   KPAFRELGTCHN 13.9 07/29/2022   KPAFRELGTCHN 15.8 07/08/2022   KPAFRELGTCHN 18.1 06/17/2022   LAMBDASER 10.3 07/29/2022   LAMBDASER 12.6 07/08/2022   LAMBDASER 15.5 06/17/2022   KAPLAMBRATIO 1.35 07/29/2022   KAPLAMBRATIO 1.25 07/08/2022   KAPLAMBRATIO 1.17 06/17/2022     RADIOGRAPHIC STUDIES: No results found.  ASSESSMENT & PLAN Sonya Franklin is a 57 y.o. female with medical history significant for newly diagnosed multiple myeloma with plasmacytoma who presents for a follow up visit.  # Multiple Myeloma with Plasmactyoma --Confirmed with L5 bone lesion biopsy and bone marrow biopsy. --Started VRD therapy on 05/05/2022.  PLAN: --Due for Cycle 6 Day 1 of VRD therapy today.  --Labs today reviewed and adequate for treatment.  Labs show white blood cell count 5.5, hemoglobin 10.9, MCV 87.1, and platelets of 221. Creatinine normal.  --Last myeloma labs from 07/29/2022 showed M protein was undetectable with normal serum free light chains.  --Patient completed her consultation with [facility] on 08/13/2022 with Dr. Ralene Cork. She is hesitant to proceed with transplant as she is struggling with ongoing pain and does not want to feel worse.  --Continue with weekly treatment and q 2 week office visits  #Jitteriness/Palpitations: --Likely secondary to steroid therapy versus abnormal thyroid function, symptoms improving with thyroid management.  --recommend increase steroid back to full dose at 40mg  PO dexamethasone.   #Right lower extremity neuropathy: --Greatly improved --Monitor for now.   #Cancer related pain: --Secondary to focal tumor replacement of the right-side of the L5  vertebral body extending into the right L5 pedicle and posterior elements with a small extraosseous component extending into the right L5 neural foramen with moderate-severe stenosi -- Unable to tolerate opoid medications -- Under the care of pallative care. Medication includes lidocaine pain patch and tylenol with minimal relief. . --Patient has agreed to repeat MRI lumbar spine to reassess bone lesions and see if she is a candidate for palliative radiation.   #Supportive Care -- chemotherapy education completed -- port placement not required.  -- zofran 8mg  q8H PRN and compazine 10mg  PO q6H for nausea -- acyclovir 400mg  PO BID for VCZ prophylaxis -- allopurinol 300mg  PO daily for TLS prophylaxis -- Pain medication as noted above  No orders of the defined types were placed in this encounter.  All questions were answered. The patient knows to call the clinic with any problems, questions or concerns.  A total of more than 30 minutes  were spent on this encounter with face-to-face time and non-face-to-face time, including preparing to see the patient, ordering tests and/or medications, counseling the patient and coordination of care as outlined above.   Georga Kaufmann PA-C Dept of Hematology and Oncology [facility] Phone: [phone]   08/19/2022 11:21 AM

## 2022-08-20 ENCOUNTER — Other Ambulatory Visit: Payer: Self-pay | Admitting: *Deleted

## 2022-08-20 DIAGNOSIS — C9000 Multiple myeloma not having achieved remission: Secondary | ICD-10-CM

## 2022-08-20 LAB — KAPPA/LAMBDA LIGHT CHAINS
Kappa free light chain: 14.1 mg/L (ref 3.3–19.4)
Kappa, lambda light chain ratio: 1.07 (ref 0.26–1.65)
Lambda free light chains: 13.2 mg/L (ref 5.7–26.3)

## 2022-08-20 MED ORDER — LENALIDOMIDE 25 MG PO CAPS
25.0000 mg | ORAL_CAPSULE | Freq: Every day | ORAL | 0 refills | Status: DC
Start: 2022-08-20 — End: 2022-09-10

## 2022-08-25 ENCOUNTER — Other Ambulatory Visit: Payer: Self-pay | Admitting: Endocrinology

## 2022-08-25 DIAGNOSIS — E042 Nontoxic multinodular goiter: Secondary | ICD-10-CM

## 2022-08-25 DIAGNOSIS — R7989 Other specified abnormal findings of blood chemistry: Secondary | ICD-10-CM

## 2022-08-25 LAB — MULTIPLE MYELOMA PANEL, SERUM
Albumin SerPl Elph-Mcnc: 3.2 g/dL (ref 2.9–4.4)
Albumin/Glob SerPl: 1.2 (ref 0.7–1.7)
Alpha 1: 0.6 g/dL — ABNORMAL HIGH (ref 0.0–0.4)
Alpha2 Glob SerPl Elph-Mcnc: 0.8 g/dL (ref 0.4–1.0)
B-Globulin SerPl Elph-Mcnc: 1.1 g/dL (ref 0.7–1.3)
Gamma Glob SerPl Elph-Mcnc: 0.5 g/dL (ref 0.4–1.8)
Globulin, Total: 2.9 g/dL (ref 2.2–3.9)
IgA: 57 mg/dL — ABNORMAL LOW (ref 87–352)
IgG (Immunoglobin G), Serum: 608 mg/dL (ref 586–1602)
IgM (Immunoglobulin M), Srm: 21 mg/dL — ABNORMAL LOW (ref 26–217)
Total Protein ELP: 6.1 g/dL (ref 6.0–8.5)

## 2022-08-25 NOTE — Progress Notes (Signed)
Her thyroid level is still borderline high, would like to consider radioactive iodine treatment for the overactive areas of her thyroid.  However we we will need to do a thyroid scan for this.  Will order this but she will need to make sure it is not done within 6 weeks of getting iodine for any other scan

## 2022-08-26 ENCOUNTER — Other Ambulatory Visit: Payer: Self-pay | Admitting: Endocrinology

## 2022-08-26 ENCOUNTER — Other Ambulatory Visit: Payer: Self-pay

## 2022-08-26 ENCOUNTER — Inpatient Hospital Stay: Payer: Medicare Other

## 2022-08-26 ENCOUNTER — Telehealth: Payer: Self-pay

## 2022-08-26 ENCOUNTER — Telehealth: Payer: Self-pay | Admitting: Internal Medicine

## 2022-08-26 VITALS — BP 137/74 | HR 77 | Temp 98.7°F | Resp 18 | Wt 174.8 lb

## 2022-08-26 DIAGNOSIS — C9000 Multiple myeloma not having achieved remission: Secondary | ICD-10-CM | POA: Diagnosis not present

## 2022-08-26 DIAGNOSIS — Z5112 Encounter for antineoplastic immunotherapy: Secondary | ICD-10-CM | POA: Diagnosis not present

## 2022-08-26 LAB — CMP (CANCER CENTER ONLY)
ALT: 17 U/L (ref 0–44)
AST: 15 U/L (ref 15–41)
Albumin: 4 g/dL (ref 3.5–5.0)
Alkaline Phosphatase: 72 U/L (ref 38–126)
Anion gap: 8 (ref 5–15)
BUN: 13 mg/dL (ref 6–20)
CO2: 26 mmol/L (ref 22–32)
Calcium: 9.2 mg/dL (ref 8.9–10.3)
Chloride: 107 mmol/L (ref 98–111)
Creatinine: 0.59 mg/dL (ref 0.44–1.00)
GFR, Estimated: >60 mL/min (ref >60)
Glucose, Bld: 95 mg/dL (ref 70–99)
Potassium: 3.7 mmol/L (ref 3.5–5.1)
Sodium: 141 mmol/L (ref 135–145)
Total Bilirubin: 0.4 mg/dL (ref 0.3–1.2)
Total Protein: 7.1 g/dL (ref 6.5–8.1)

## 2022-08-26 LAB — CBC WITH DIFFERENTIAL (CANCER CENTER ONLY)
Abs Immature Granulocytes: 0.02 10*3/uL (ref 0.00–0.07)
Basophils Absolute: 0 10*3/uL (ref 0.0–0.1)
Basophils Relative: 0 %
Eosinophils Absolute: 0.3 10*3/uL (ref 0.0–0.5)
Eosinophils Relative: 4 %
HCT: 36.1 % (ref 36.0–46.0)
Hemoglobin: 11.5 g/dL — ABNORMAL LOW (ref 12.0–15.0)
Immature Granulocytes: 0 %
Lymphocytes Relative: 22 %
Lymphs Abs: 1.3 10*3/uL (ref 0.7–4.0)
MCH: 28.3 pg (ref 26.0–34.0)
MCHC: 31.9 g/dL (ref 30.0–36.0)
MCV: 88.7 fL (ref 80.0–100.0)
Monocytes Absolute: 1.2 10*3/uL — ABNORMAL HIGH (ref 0.1–1.0)
Monocytes Relative: 21 %
Neutro Abs: 3 10*3/uL (ref 1.7–7.7)
Neutrophils Relative %: 53 %
Platelet Count: 187 10*3/uL (ref 150–400)
RBC: 4.07 MIL/uL (ref 3.87–5.11)
RDW: 15.4 % (ref 11.5–15.5)
WBC Count: 5.8 10*3/uL (ref 4.0–10.5)
nRBC: 0 % (ref 0.0–0.2)

## 2022-08-26 MED ORDER — DEXAMETHASONE 4 MG PO TABS
20.0000 mg | ORAL_TABLET | Freq: Once | ORAL | Status: AC
  Administered 2022-08-26: 20 mg via ORAL
  Filled 2022-08-26: qty 5

## 2022-08-26 MED ORDER — BORTEZOMIB CHEMO SQ INJECTION 3.5 MG (2.5MG/ML)
1.3000 mg/m2 | Freq: Once | INTRAMUSCULAR | Status: AC
  Administered 2022-08-26: 2.5 mg via SUBCUTANEOUS
  Filled 2022-08-26: qty 1

## 2022-08-26 MED ORDER — SODIUM CHLORIDE 0.9 % IV SOLN: INTRAVENOUS | Status: AC

## 2022-08-26 NOTE — Patient Instructions (Signed)
[place] [facility]  Discharge Instructions: Thank you for choosing [facility] to provide your oncology and hematology care.   If you have a lab appointment with the Cancer Center, please go directly to the Cancer Center and check in at the registration area.   Wear comfortable clothing and clothing appropriate for easy access to any Portacath or PICC line.   We strive to give you quality time with your provider. You may need to reschedule your appointment if you arrive late (15 or more minutes).  Arriving late affects you and other patients whose appointments are after yours.  Also, if you miss three or more appointments without notifying the office, you may be dismissed from the clinic at the provider's discretion.      For prescription refill requests, have your pharmacy contact our office and allow 72 hours for refills to be completed.    Today you received the following chemotherapy and/or immunotherapy agents: Velcade  and IVF    To help prevent nausea and vomiting after your treatment, we encourage you to take your nausea medication as directed.  BELOW ARE SYMPTOMS THAT SHOULD BE REPORTED IMMEDIATELY: *FEVER GREATER THAN 100.4 F (38 C) OR HIGHER *CHILLS OR SWEATING *NAUSEA AND VOMITING THAT IS NOT CONTROLLED WITH YOUR NAUSEA MEDICATION *UNUSUAL SHORTNESS OF BREATH *UNUSUAL BRUISING OR BLEEDING *URINARY PROBLEMS (pain or burning when urinating, or frequent urination) *BOWEL PROBLEMS (unusual diarrhea, constipation, pain near the anus) TENDERNESS IN MOUTH AND THROAT WITH OR WITHOUT PRESENCE OF ULCERS (sore throat, sores in mouth, or a toothache) UNUSUAL RASH, SWELLING OR PAIN  UNUSUAL VAGINAL DISCHARGE OR ITCHING   Items with * indicate a potential emergency and should be followed up as soon as possible or go to the Emergency Department if any problems should occur.  Please show the CHEMOTHERAPY ALERT CARD or IMMUNOTHERAPY ALERT CARD at  check-in to the Emergency Department and triage nurse.  Should you have questions after your visit or need to cancel or reschedule your appointment, please contact [place] [facility]  Dept: [phone]  and follow the prompts.  Office hours are 8:00 a.m. to 4:30 p.m. Monday - Friday. Please note that voicemails left after 4:00 p.m. may not be returned until the following business day.  We are closed weekends and major holidays. You have access to a nurse at all times for urgent questions. Please call the main number to the clinic Dept: [phone] and follow the prompts.   For any non-urgent questions, you may also contact your provider using MyChart. We now offer e-Visits for anyone 58 and older to request care online for non-urgent symptoms. For details visit [email or web address].   Also download the MyChart app! Go to the app store, search "MyChart", open the app, select [place], and log in with your MyChart username and password.

## 2022-08-26 NOTE — Telephone Encounter (Signed)
Sonya Franklin [phone]  Anaria called to say her ears are stopped up and her hearing is muffled. She wanted to come in today. I ask her to call back first thing in the morning, let her know options today were video visit, urgent care or try calling cancer center to see if they cold help her, since you were out of office this afternoon.

## 2022-08-26 NOTE — Telephone Encounter (Signed)
-----   Message from Reather Littler, MD sent at 08/25/2022  9:05 AM EDT ----- Her thyroid level is still borderline high, would like to consider radioactive iodine treatment for the overactive areas of her thyroid.  However we we will need to do a thyroid scan for this.  Will order this but she will need to make sure it is not done within 6 weeks of getting iodine for any other scan

## 2022-08-26 NOTE — Telephone Encounter (Signed)
Left message for patient to call back to discuss results and recommendations. °

## 2022-08-27 NOTE — Telephone Encounter (Signed)
LVM to CB.

## 2022-08-27 NOTE — Telephone Encounter (Signed)
LVM to call the office to let me knw how she is feeling and to see if she needs to come in to be seen.

## 2022-08-28 ENCOUNTER — Other Ambulatory Visit: Payer: Self-pay

## 2022-08-28 MED ORDER — FUROSEMIDE 20 MG PO TABS: 20.0000 mg | ORAL_TABLET | Freq: Every day | ORAL | 0 refills | Status: DC | PRN

## 2022-08-28 NOTE — Telephone Encounter (Signed)
Sonya Franklin called back today, she is still sick, she said she would just go to a minute clinic. I let her know you would be in this afternoon and we could work her in.

## 2022-08-31 NOTE — Progress Notes (Unsigned)
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  05/05/2022: Cycle 1 Day 1 of VRD therapy 05/27/2022: Cycle 2 Day 1 of VRD therapy 06/17/2022: Cycle 3 Day 1 of VRD therapy 07/08/2022: Cycle 4 Day 1 of VRD therapy 07/29/2022: Cycle 5 Day 1 of VRD therapy 08/19/2022: Cycle 6 Day 1 of VRD therapy  Interval History:  Sonya Franklin 57 y.o. female with medical history significant for recently diagnosed multiple myeloma with plasmacytoma who presents for a follow up visit. The patient's last visit was on 08/19/2022. She presents today to start Cycle 6, Day 15 of VRD therapy.   On exam today Mrs. Peri reports she is running out of lidocaine pain patches which "eases off the pain little bit".  She is concerned today because she has what feels like blockages in her ears bilaterally.  She notes it is worse on the left side.  She notes that she is currently working with her endocrinologist because she is having "increased trembling" and thinks that this may be due to thyroid issues.  She has upcoming nuclear medicine testing.  She does that she is currently faithfully taking her Revlimid pills as well as fluid pills and aspirin.  She has not been taking her acyclovir but we discussed that in detail today.  She denies fevers, chills, sweats, shortness of breath, chest pain or cough. She has no other complaints. Rest of the 10 point ROS is below.   The patient had the opportunity to meet with her and discuss bone marrow transplant with [facility].  We discussed options for treatment moving forward and the patient notes  that she would prefer maintenance therapy over bone marrow transplant.  We made [facility] aware of her decision.  MEDICAL HISTORY:  Past Medical History:  Diagnosis Date   Ankylosing spondylitis (HCC)    Anxiety    Arthritis    Breast cancer (HCC)    right breast   Bursitis of hip, right    Cardiac arrhythmia    Carotid stenosis 08/26/2021   GE reflux    Hiatal hernia    Hypertension    Hyperthyroidism    Ischemic bowel disease (HCC)    Multiple myeloma not having achieved remission (HCC)    Prediabetes 08/26/2021   Snoring 03/21/2014   Vitamin D deficiency     SURGICAL HISTORY: Past Surgical History:  Procedure Laterality Date   ABDOMINAL HYSTERECTOMY     BREAST LUMPECTOMY     right   MASTECTOMY     double    SOCIAL HISTORY: Social History   Socioeconomic History   Marital status: Divorced    Spouse name: Not on file   Number of children: 1   Years of education: Not on file   Highest education level: Not on file  Occupational History    Employer: UNEMPLOYED  Tobacco Use   Smoking status: Never   Smokeless tobacco: Never  Vaping Use   Vaping Use: Never used  Substance and Sexual Activity   Alcohol use: No   Drug use: No   Sexual activity: Yes  Other Topics Concern   Not on file  Social History Narrative  Caffeine occasional drinker.   Not working/disabled.  12th grader. Divorced, one kids.       Social history: She receives disability benefits for history of breast cancer 2007 and ankylosing spondylitis.  She resides with special needs son.  She is divorced.  Non-smoker.  No alcohol consumption.       Family history: Father with history of prostate cancer.  Patient's mother was diagnosed with breast cancer at age 70.  Maternal aunt with history of breast cancer.  3 brothers.   Right handed   Caffeine occas   One level stays on this level       Social Determinants of Health   Financial Resource Strain: Low Risk  (08/26/2021)   Overall Financial  Resource Strain (CARDIA)    Difficulty of Paying Living Expenses: Not hard at all  Food Insecurity: No Food Insecurity (05/25/2022)   Hunger Vital Sign    Worried About Running Out of Food in the Last Year: Never true    Ran Out of Food in the Last Year: Never true  Transportation Needs: No Transportation Needs (08/26/2021)   PRAPARE - Administrator, Civil Service (Medical): No    Lack of Transportation (Non-Medical): No  Physical Activity: Inactive (08/26/2021)   Exercise Vital Sign    Days of Exercise per Week: 0 days    Minutes of Exercise per Session: 0 min  Stress: Not on file  Social Connections: Not on file  Intimate Partner Violence: Not At Risk (04/21/2022)   Humiliation, Afraid, Rape, and Kick questionnaire    Fear of Current or Ex-Partner: No    Emotionally Abused: No    Physically Abused: No    Sexually Abused: No    FAMILY HISTORY: Family History  Problem Relation Age of Onset   Thyroid disease Mother    Diabetes Mother    Hypertension Mother    Hyperlipidemia Mother    High blood pressure Father    High Cholesterol Father    Colitis Brother    Goiter Maternal Grandmother    Breast cancer Maternal Aunt     ALLERGIES:  is allergic to shellfish allergy, blue dyes (parenteral), and hydrochlorothiazide.  MEDICATIONS:  Current Outpatient Medications  Medication Sig Dispense Refill   acyclovir (ZOVIRAX) 400 MG tablet TAKE 1 TABLET BY MOUTH TWICE A DAY 180 tablet 1   amLODipine (NORVASC) 5 MG tablet TAKE 1 TABLET (5 MG TOTAL) BY MOUTH DAILY. 90 tablet 2   aspirin EC 81 MG tablet Take 81 mg by mouth daily. Swallow whole.     furosemide (LASIX) 20 MG tablet Take 1 tablet (20 mg total) by mouth daily as needed. 90 tablet 0   HYDROcodone bit-homatropine (HYCODAN) 5-1.5 MG/5ML syrup Take 5 mLs by mouth every 8 (eight) hours as needed for cough. 120 mL 0   hyoscyamine (ANASPAZ) 0.125 MG TBDP disintergrating tablet Place 1 tablet (0.125 mg total) under the tongue  every 4 (four) hours as needed for cramping. 30 tablet 0   KLOR-CON M20 20 MEQ tablet TAKE 2 TABLETS BY MOUTH DAILY 180 tablet 1   lenalidomide (REVLIMID) 25 MG capsule Take 1 capsule (25 mg total) by mouth daily. Take for 14 days on, then 7 days off. Repeat every 21 days. Auth # [id number]  Date obtained 08/20/22 14 capsule 0   lidocaine (LIDODERM) 5 % Place 1 patch onto the skin daily. Remove & Discard patch within 12 hours or as directed by MD 30 patch 1   metoprolol  tartrate (LOPRESSOR) 25 MG tablet ONE HALF TAB BY MOUTH UP TO TWICE DAILY IF NEEDED FOR PALPITATIONS 90 tablet 1   nystatin (MYCOSTATIN) 100000 UNIT/ML suspension Take 5 mLs (500,000 Units total) by mouth 4 (four) times daily. 60 mL 0   olopatadine (PATANOL) 0.1 % ophthalmic solution Place 1 drop into both eyes 2 (two) times daily as needed. 5 mL PRN   pantoprazole (PROTONIX) 40 MG tablet TAKE 1 TABLET BY MOUTH EVERY DAY 90 tablet 3   traMADol (ULTRAM) 50 MG tablet Take 1 tablet (50 mg total) by mouth every 8 (eight) hours as needed for moderate pain. 30 tablet 0   triamcinolone cream (KENALOG) 0.1 % Apply 1 Application topically 3 (three) times daily. (Patient taking differently: Apply 1 Application topically 3 (three) times daily. As needed) 30 g 1   No current facility-administered medications for this visit.    REVIEW OF SYSTEMS:   Constitutional: ( - ) fevers, ( - )  chills , ( - ) night sweats Eyes: ( - ) blurriness of vision, ( - ) double vision, ( - ) watery eyes Ears, nose, mouth, throat, and face: ( - ) mucositis, ( - ) sore throat Respiratory: ( - ) cough, ( - ) dyspnea, ( - ) wheezes Cardiovascular: ( - ) palpitation, ( - ) chest discomfort, ( + ) lower extremity swelling Gastrointestinal:  ( - ) nausea, ( - ) heartburn, ( - ) change in bowel habits Skin: ( - ) abnormal skin rashes Lymphatics: ( - ) new lymphadenopathy, ( - ) easy bruising Neurological: ( - ) numbness, ( - ) tingling, ( - ) new  weaknesses Behavioral/Psych: ( - ) mood change, ( - ) new changes  All other systems were reviewed with the patient and are negative.  PHYSICAL EXAMINATION: ECOG PERFORMANCE STATUS: 1 - Symptomatic but completely ambulatory  Vitals:   09/01/22 0956  BP: (!) 146/84  Pulse: 69  Resp: 13  Temp: 97.9 F (36.6 C)  SpO2: 100%   Filed Weights   09/01/22 0956  Weight: 178 lb (80.7 kg)    GENERAL: Well-appearing middle-aged African-American female, alert, no distress and comfortable SKIN: skin color, texture, turgor are normal, no rashes or significant lesions EYES: conjunctiva are pink and non-injected, sclera clear LUNGS: clear to auscultation and percussion with normal breathing effort HEART: regular rate & rhythm and no murmurs. Mild bilateral lower extremity edema Musculoskeletal: no cyanosis of digits and no clubbing  PSYCH: alert & oriented x 3, fluent speech NEURO: no focal motor/sensory deficits  LABORATORY DATA:  I have reviewed the data as listed    Latest Ref Rng & Units 09/01/2022    9:02 AM 08/26/2022   12:46 PM 08/19/2022    9:26 AM  CBC  WBC 4.0 - 10.5 K/uL 4.6  5.8  5.5   Hemoglobin 12.0 - 15.0 g/dL 65.7  84.6  96.2   Hematocrit 36.0 - 46.0 % 33.9  36.1  33.2   Platelets 150 - 400 K/uL 162  187  221        Latest Ref Rng & Units 09/01/2022    9:02 AM 08/26/2022   12:46 PM 08/19/2022    9:26 AM  CMP  Glucose 70 - 99 mg/dL 88  95  86   BUN 6 - 20 mg/dL 13  13  14    Creatinine 0.44 - 1.00 mg/dL 9.52  8.41  3.24   Sodium 135 - 145 mmol/L 141  141  141  Potassium 3.5 - 5.1 mmol/L 3.7  3.7  3.7   Chloride 98 - 111 mmol/L 109  107  106   CO2 22 - 32 mmol/L 26  26  27    Calcium 8.9 - 10.3 mg/dL 9.3  9.2  9.5   Total Protein 6.5 - 8.1 g/dL 6.1  7.1  6.8   Total Bilirubin 0.3 - 1.2 mg/dL 0.5  0.4  0.4   Alkaline Phos 38 - 126 U/L 70  72  76   AST 15 - 41 U/L 11  15  16    ALT 0 - 44 U/L 15  17  21      Lab Results  Component Value Date   MPROTEIN Not Observed  08/19/2022   MPROTEIN Not Observed 07/29/2022   MPROTEIN Not Observed 07/08/2022   Lab Results  Component Value Date   KPAFRELGTCHN 14.1 08/19/2022   KPAFRELGTCHN 13.9 07/29/2022   KPAFRELGTCHN 15.8 07/08/2022   LAMBDASER 13.2 08/19/2022   LAMBDASER 10.3 07/29/2022   LAMBDASER 12.6 07/08/2022   KAPLAMBRATIO 1.07 08/19/2022   KAPLAMBRATIO 1.35 07/29/2022   KAPLAMBRATIO 1.25 07/08/2022     RADIOGRAPHIC STUDIES: No results found.  ASSESSMENT & PLAN Sonya Franklin is a 57 y.o. female with medical history significant for newly diagnosed multiple myeloma with plasmacytoma who presents for a follow up visit.  # Multiple Myeloma with Plasmactyoma --Confirmed with L5 bone lesion biopsy and bone marrow biopsy. --Started VRD therapy on 05/05/2022.  PLAN: --Due for Cycle 6 Day 15 of VRD therapy today.  --Labs today reviewed and adequate for treatment.  Labs show white blood cell count 4.6, Hgb 10.9, MCV 87.1, Plt 162. Creatinine normal.  --Last myeloma labs from 07/29/2022 showed M protein was undetectable with normal serum free light chains.  --Patient completed her consultation with [facility] on 08/13/2022.  She notes that she would prefer to pursue maintenance therapy over bone marrow transplant. --Continue with weekly treatment and q 2 week office visits  #Jitteriness/Palpitations: --Likely secondary to steroid therapy versus abnormal thyroid function, symptoms improving with thyroid management.  --continue 20mg  PO dexamethasone.   #Right lower extremity neuropathy: --Greatly improved --Monitor for now.   #Cancer related pain: --Secondary to focal tumor replacement of the right-side of the L5 vertebral body extending into the right L5 pedicle and posterior elements with a small extraosseous component extending into the right L5 neural foramen with moderate-severe stenosi -- Unable to tolerate opoid medications -- Under the care of pallative care. Medication includes  lidocaine pain patch and tylenol with minimal relief. . --Patient has agreed to repeat MRI lumbar spine to reassess bone lesions and see if she is a candidate for palliative radiation.   #Supportive Care -- chemotherapy education completed -- port placement not required.  -- zofran 8mg  q8H PRN and compazine 10mg  PO q6H for nausea -- acyclovir 400mg  PO BID for VCZ prophylaxis -- Pain medication as noted above  Orders Placed This Encounter  Procedures   Multiple Myeloma Panel (SPEP&IFE w/QIG)    Standing Status:   Future    Standing Expiration Date:   09/30/2023   Kappa/lambda light chains    Standing Status:   Future    Standing Expiration Date:   09/30/2023   CBC with Differential (Cancer Center Only)    Standing Status:   Future    Standing Expiration Date:   09/30/2023   CMP (Cancer Center only)    Standing Status:   Future    Standing Expiration Date:  09/30/2023   CBC with Differential (Cancer Center Only)    Standing Status:   Future    Standing Expiration Date:   10/07/2023   CMP (Cancer Center only)    Standing Status:   Future    Standing Expiration Date:   10/07/2023   CBC with Differential (Cancer Center Only)    Standing Status:   Future    Standing Expiration Date:   10/14/2023   CMP (Cancer Center only)    Standing Status:   Future    Standing Expiration Date:   10/14/2023   All questions were answered. The patient knows to call the clinic with any problems, questions or concerns.  A total of more than 30 minutes were spent on this encounter with face-to-face time and non-face-to-face time, including preparing to see the patient, ordering tests and/or medications, counseling the patient and coordination of care as outlined above.   Ulysees Barns, MD Department of Hematology/Oncology [facility] Phone: [phone] Pager: [phone] Email: [email or web address]  09/01/2022 4:30 PM

## 2022-09-01 ENCOUNTER — Other Ambulatory Visit: Payer: Self-pay

## 2022-09-01 ENCOUNTER — Inpatient Hospital Stay: Payer: Medicare Other | Admitting: Hematology and Oncology

## 2022-09-01 ENCOUNTER — Inpatient Hospital Stay: Payer: Medicare Other

## 2022-09-01 VITALS — BP 146/84 | HR 69 | Temp 97.9°F | Resp 13 | Wt 178.0 lb

## 2022-09-01 DIAGNOSIS — C9000 Multiple myeloma not having achieved remission: Secondary | ICD-10-CM

## 2022-09-01 DIAGNOSIS — Z5112 Encounter for antineoplastic immunotherapy: Secondary | ICD-10-CM | POA: Diagnosis not present

## 2022-09-01 DIAGNOSIS — G893 Neoplasm related pain (acute) (chronic): Secondary | ICD-10-CM

## 2022-09-01 DIAGNOSIS — Z515 Encounter for palliative care: Secondary | ICD-10-CM

## 2022-09-01 DIAGNOSIS — M899 Disorder of bone, unspecified: Secondary | ICD-10-CM

## 2022-09-01 LAB — CBC WITH DIFFERENTIAL (CANCER CENTER ONLY)
Abs Immature Granulocytes: 0.02 10*3/uL (ref 0.00–0.07)
Basophils Absolute: 0 10*3/uL (ref 0.0–0.1)
Basophils Relative: 0 %
Eosinophils Absolute: 0.1 10*3/uL (ref 0.0–0.5)
Eosinophils Relative: 3 %
HCT: 33.9 % — ABNORMAL LOW (ref 36.0–46.0)
Hemoglobin: 10.9 g/dL — ABNORMAL LOW (ref 12.0–15.0)
Immature Granulocytes: 0 %
Lymphocytes Relative: 26 %
Lymphs Abs: 1.2 10*3/uL (ref 0.7–4.0)
MCH: 28 pg (ref 26.0–34.0)
MCHC: 32.2 g/dL (ref 30.0–36.0)
MCV: 87.1 fL (ref 80.0–100.0)
Monocytes Absolute: 1.1 10*3/uL — ABNORMAL HIGH (ref 0.1–1.0)
Monocytes Relative: 24 %
Neutro Abs: 2.2 10*3/uL (ref 1.7–7.7)
Neutrophils Relative %: 47 %
Platelet Count: 162 10*3/uL (ref 150–400)
RBC: 3.89 MIL/uL (ref 3.87–5.11)
RDW: 15.4 % (ref 11.5–15.5)
WBC Count: 4.6 10*3/uL (ref 4.0–10.5)
nRBC: 0 % (ref 0.0–0.2)

## 2022-09-01 LAB — CMP (CANCER CENTER ONLY)
ALT: 15 U/L (ref 0–44)
AST: 11 U/L — ABNORMAL LOW (ref 15–41)
Albumin: 3.8 g/dL (ref 3.5–5.0)
Alkaline Phosphatase: 70 U/L (ref 38–126)
Anion gap: 6 (ref 5–15)
BUN: 13 mg/dL (ref 6–20)
CO2: 26 mmol/L (ref 22–32)
Calcium: 9.3 mg/dL (ref 8.9–10.3)
Chloride: 109 mmol/L (ref 98–111)
Creatinine: 0.59 mg/dL (ref 0.44–1.00)
GFR, Estimated: >60 mL/min (ref >60)
Glucose, Bld: 88 mg/dL (ref 70–99)
Potassium: 3.7 mmol/L (ref 3.5–5.1)
Sodium: 141 mmol/L (ref 135–145)
Total Bilirubin: 0.5 mg/dL (ref 0.3–1.2)
Total Protein: 6.1 g/dL — ABNORMAL LOW (ref 6.5–8.1)

## 2022-09-01 MED ORDER — SODIUM CHLORIDE 0.9 % IV SOLN: INTRAVENOUS | Status: AC

## 2022-09-01 MED ORDER — BORTEZOMIB CHEMO SQ INJECTION 3.5 MG (2.5MG/ML)
1.3000 mg/m2 | Freq: Once | INTRAMUSCULAR | Status: AC
  Administered 2022-09-01: 2.5 mg via SUBCUTANEOUS
  Filled 2022-09-01: qty 1

## 2022-09-01 MED ORDER — DEXAMETHASONE 4 MG PO TABS
20.0000 mg | ORAL_TABLET | Freq: Once | ORAL | Status: AC
  Administered 2022-09-01: 20 mg via ORAL
  Filled 2022-09-01: qty 5

## 2022-09-01 MED ORDER — LIDOCAINE 5 % EX PTCH
1.0000 | MEDICATED_PATCH | CUTANEOUS | 1 refills | Status: AC
Start: 2022-09-01 — End: ?

## 2022-09-01 NOTE — Patient Instructions (Signed)
[place] [facility]  Discharge Instructions: Thank you for choosing [facility] to provide your oncology and hematology care.   If you have a lab appointment with the Cancer Center, please go directly to the Cancer Center and check in at the registration area.   Wear comfortable clothing and clothing appropriate for easy access to any Portacath or PICC line.   We strive to give you quality time with your provider. You may need to reschedule your appointment if you arrive late (15 or more minutes).  Arriving late affects you and other patients whose appointments are after yours.  Also, if you miss three or more appointments without notifying the office, you may be dismissed from the clinic at the provider's discretion.      For prescription refill requests, have your pharmacy contact our office and allow 72 hours for refills to be completed.    Today you received the following chemotherapy and/or immunotherapy agent: Bortezomib (Velcade)  To help prevent nausea and vomiting after your treatment, we encourage you to take your nausea medication as directed.  BELOW ARE SYMPTOMS THAT SHOULD BE REPORTED IMMEDIATELY: *FEVER GREATER THAN 100.4 F (38 C) OR HIGHER *CHILLS OR SWEATING *NAUSEA AND VOMITING THAT IS NOT CONTROLLED WITH YOUR NAUSEA MEDICATION *UNUSUAL SHORTNESS OF BREATH *UNUSUAL BRUISING OR BLEEDING *URINARY PROBLEMS (pain or burning when urinating, or frequent urination) *BOWEL PROBLEMS (unusual diarrhea, constipation, pain near the anus) TENDERNESS IN MOUTH AND THROAT WITH OR WITHOUT PRESENCE OF ULCERS (sore throat, sores in mouth, or a toothache) UNUSUAL RASH, SWELLING OR PAIN  UNUSUAL VAGINAL DISCHARGE OR ITCHING   Items with * indicate a potential emergency and should be followed up as soon as possible or go to the Emergency Department if any problems should occur.  Please show the CHEMOTHERAPY ALERT CARD or IMMUNOTHERAPY ALERT CARD at  check-in to the Emergency Department and triage nurse.  Should you have questions after your visit or need to cancel or reschedule your appointment, please contact [place] [facility]  Dept: [phone]  and follow the prompts.  Office hours are 8:00 a.m. to 4:30 p.m. Monday - Friday. Please note that voicemails left after 4:00 p.m. may not be returned until the following business day.  We are closed weekends and major holidays. You have access to a nurse at all times for urgent questions. Please call the main number to the clinic Dept: [phone] and follow the prompts.   For any non-urgent questions, you may also contact your provider using MyChart. We now offer e-Visits for anyone 59 and older to request care online for non-urgent symptoms. For details visit [email or web address].   Also download the MyChart app! Go to the app store, search "MyChart", open the app, select [facility], and log in with your MyChart username and password.  Bortezomib Injection What is this medication? BORTEZOMIB (bor TEZ oh mib) treats lymphoma. It may also be used to treat multiple myeloma, a type of bone marrow cancer. It works by blocking a protein that causes cancer cells to grow and multiply. This helps to slow or stop the spread of cancer cells. This medicine may be used for other purposes; ask your health care provider or pharmacist if you have questions. COMMON BRAND NAME(S): Velcade What should I tell my care team before I take this medication? They need to know if you have any of these conditions: Dehydration Diabetes Heart disease Liver disease Tingling of the fingers  or toes or other nerve disorder An unusual or allergic reaction to bortezomib, other medications, foods, dyes, or preservatives If you or your partner are pregnant or trying to get pregnant Breastfeeding How should I use this medication? This medication is injected into a vein or under the skin. It  is given by your care team in a hospital or clinic setting. Talk to your care team about the use of this medication in children. Special care may be needed. Overdosage: If you think you have taken too much of this medicine contact a poison control center or emergency room at once. NOTE: This medicine is only for you. Do not share this medicine with others. What if I miss a dose? Keep appointments for follow-up doses. It is important not to miss your dose. Call your care team if you are unable to keep an appointment. What may interact with this medication? Ketoconazole Rifampin This list may not describe all possible interactions. Give your health care provider a list of all the medicines, herbs, non-prescription drugs, or dietary supplements you use. Also tell them if you smoke, drink alcohol, or use illegal drugs. Some items may interact with your medicine. What should I watch for while using this medication? Your condition will be monitored carefully while you are receiving this medication. You may need blood work while taking this medication. This medication may affect your coordination, reaction time, or judgment. Do not drive or operate machinery until you know how this medication affects you. Sit up or stand slowly to reduce the risk of dizzy or fainting spells. Drinking alcohol with this medication can increase the risk of these side effects. This medication may increase your risk of getting an infection. Call your care team for advice if you get a fever, chills, sore throat, or other symptoms of a cold or flu. Do not treat yourself. Try to avoid being around people who are sick. Check with your care team if you have severe diarrhea, nausea, and vomiting, or if you sweat a lot. The loss of too much body fluid may make it dangerous for you to take this medication. Talk to your care team if you may be pregnant. Serious birth defects can occur if you take this medication during pregnancy and for 7  months after the last dose. You will need a negative pregnancy test before starting this medication. Contraception is recommended while taking this medication and for 7 months after the last dose. Your care team can help you find the option that works for you. If your partner can get pregnant, use a condom during sex while taking this medication and for 4 months after the last dose. Do not breastfeed while taking this medication and for 2 months after the last dose. This medication may cause infertility. Talk to your care team if you are concerned about your fertility. What side effects may I notice from receiving this medication? Side effects that you should report to your care team as soon as possible: Allergic reactions--skin rash, itching, hives, swelling of the face, lips, tongue, or throat Bleeding--bloody or black, tar-like stools, vomiting blood or brown material that looks like coffee grounds, red or dark brown urine, small red or purple spots on skin, unusual bruising or bleeding Bleeding in the brain--severe headache, stiff neck, confusion, dizziness, change in vision, numbness or weakness of the face, arm, or leg, trouble speaking, trouble walking, vomiting Bowel blockage--stomach cramping, unable to have a bowel movement or pass gas, loss of appetite, vomiting Heart  failure--shortness of breath, swelling of the ankles, feet, or hands, sudden weight gain, unusual weakness or fatigue Infection--fever, chills, cough, sore throat, wounds that don't heal, pain or trouble when passing urine, general feeling of discomfort or being unwell Liver injury--right upper belly pain, loss of appetite, nausea, light-colored stool, dark yellow or brown urine, yellowing skin or eyes, unusual weakness or fatigue Low blood pressure--dizziness, feeling faint or lightheaded, blurry vision Lung injury--shortness of breath or trouble breathing, cough, spitting up blood, chest pain, fever Pain, tingling, or  numbness in the hands or feet Severe or prolonged diarrhea Stomach pain, bloody diarrhea, pale skin, unusual weakness or fatigue, decrease in the amount of urine, which may be signs of hemolytic uremic syndrome Sudden and severe headache, confusion, change in vision, seizures, which may be signs of posterior reversible encephalopathy syndrome (PRES) TTP--purple spots on the skin or inside the mouth, pale skin, yellowing skin or eyes, unusual weakness or fatigue, fever, fast or irregular heartbeat, confusion, change in vision, trouble speaking, trouble walking Tumor lysis syndrome (TLS)--nausea, vomiting, diarrhea, decrease in the amount of urine, dark urine, unusual weakness or fatigue, confusion, muscle pain or cramps, fast or irregular heartbeat, joint pain Side effects that usually do not require medical attention (report to your care team if they continue or are bothersome): Constipation Diarrhea Fatigue Loss of appetite Nausea This list may not describe all possible side effects. Call your doctor for medical advice about side effects. You may report side effects to FDA at 1-800-FDA-1088. Where should I keep my medication? This medication is given in a hospital or clinic. It will not be stored at home. NOTE: This sheet is a summary. It may not cover all possible information. If you have questions about this medicine, talk to your doctor, pharmacist, or health care provider.  2024 Elsevier/Gold Standard (2021-08-05 00:00:00)

## 2022-09-04 NOTE — Progress Notes (Unsigned)
Palliative Medicine [facility]  Telephone:(336) [phone] [phone]) [phone]   Name: Sonya Franklin Date: 09/04/2022 MRN: [id number]  DOB: 01-Oct-1965  Patient Care Team: Margaree Mackintosh, MD as PCP - General (Internal Medicine)    INTERVAL HISTORY: Sonya Franklin is a 56 y.o. 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 Palliative ask to see for symptom management and goals of care.   SOCIAL HISTORY:     reports that she has never smoked. She has never used smokeless tobacco. She reports that she does not drink alcohol and does not use drugs.  ADVANCE DIRECTIVES:  None on file  CODE STATUS: Full code  PAST MEDICAL HISTORY: Past Medical History:  Diagnosis Date   Ankylosing spondylitis (HCC)    Anxiety    Arthritis    Breast cancer (HCC)    right breast   Bursitis of hip, right    Cardiac arrhythmia    Carotid stenosis 08/26/2021   GE reflux    Hiatal hernia    Hypertension    Hyperthyroidism    Ischemic bowel disease (HCC)    Multiple myeloma not having achieved remission (HCC)    Prediabetes 08/26/2021   Snoring 03/21/2014   Vitamin D deficiency     ALLERGIES:  is allergic to shellfish allergy, blue dyes (parenteral), and hydrochlorothiazide.  MEDICATIONS:  Current Outpatient Medications  Medication Sig Dispense Refill   acyclovir (ZOVIRAX) 400 MG tablet TAKE 1 TABLET BY MOUTH TWICE A DAY 180 tablet 1   amLODipine (NORVASC) 5 MG tablet TAKE 1 TABLET (5 MG TOTAL) BY MOUTH DAILY. 90 tablet 2   aspirin EC 81 MG tablet Take 81 mg by mouth daily. Swallow whole.     furosemide (LASIX) 20 MG tablet Take 1 tablet (20 mg total) by mouth daily as needed. 90 tablet 0   HYDROcodone bit-homatropine (HYCODAN) 5-1.5 MG/5ML syrup Take 5 mLs by mouth every 8 (eight) hours as needed for cough. 120 mL 0   hyoscyamine (ANASPAZ) 0.125 MG TBDP disintergrating tablet  Place 1 tablet (0.125 mg total) under the tongue every 4 (four) hours as needed for cramping. 30 tablet 0   KLOR-CON M20 20 MEQ tablet TAKE 2 TABLETS BY MOUTH DAILY 180 tablet 1   lenalidomide (REVLIMID) 25 MG capsule Take 1 capsule (25 mg total) by mouth daily. Take for 14 days on, then 7 days off. Repeat every 21 days. Auth # [id number]  Date obtained 08/20/22 14 capsule 0   lidocaine (LIDODERM) 5 % Place 1 patch onto the skin daily. Remove & Discard patch within 12 hours or as directed by MD 30 patch 1   metoprolol tartrate (LOPRESSOR) 25 MG tablet ONE HALF TAB BY MOUTH UP TO TWICE DAILY IF NEEDED FOR PALPITATIONS 90 tablet 1   nystatin (MYCOSTATIN) 100000 UNIT/ML suspension Take 5 mLs (500,000 Units total) by mouth 4 (four) times daily. 60 mL 0   olopatadine (PATANOL) 0.1 % ophthalmic solution Place 1 drop into both eyes 2 (two) times daily as needed. 5 mL PRN   pantoprazole (PROTONIX) 40 MG tablet TAKE 1 TABLET BY MOUTH EVERY DAY 90 tablet 3   traMADol (ULTRAM) 50 MG tablet Take 1 tablet (50 mg total) by mouth every 8 (eight) hours as needed for moderate pain. 30 tablet 0   triamcinolone cream (KENALOG) 0.1 % Apply 1 Application topically 3 (three) times daily. (Patient taking differently: Apply 1 Application topically  3 (three) times daily. As needed) 30 g 1   No current facility-administered medications for this visit.    VITAL SIGNS: There were no vitals taken for this visit. There were no vitals filed for this visit.  Estimated body mass index is 33.63 kg/m as calculated from the following:   Height as of 08/13/22: 5\' 1"  (1.549 m).   Weight as of 09/01/22: 178 lb (80.7 kg).   PERFORMANCE STATUS (ECOG) : 1 - Symptomatic but completely ambulatory   Physical Exam General: NAD Cardiovascular: regular rate and rhythm Pulmonary: normal breathing pattern  Extremities: trace bilateral edema, no joint deformities Skin: no rashes Neurological: AAO x4  IMPRESSION:  I saw Sonya Franklin during  her infusion. No acute distress. No family present. States overall she is trying to remain as active as possible however this remains limited due to her ongoing pain and discomfort. Appetite is good. Denies nausea, vomiting, constipation, or diarrhea.   Pain Patient endorses ongoing arthritic leg and back pain.  She is trying her best to manage however some days are better than others. She prefers not to take major opioids but is open to taking something milder as she is becoming more and more uncomfortable. Non pharmacological methods are not providing her any relief.    I discussed at length limitations in the setting of her chemo regimen. After consideration discussed use of celebrex. Patient has a listed allergy to HTCZ which does place her at risk for reaction to Celebrex. Reaction listed as itching. This will need to be verified and discussed with Sonya Franklin prior to initiating.   Patient understands we are available as needed.    PLAN:   Extensive discussion regarding nonpharmacological methods to assist with her pain and discomfort.  Education also provided on use of oral medications.   Open to use of medication for pain that is not opioid classification. Left voicemail later in the day to discuss potential use of celebrex or mobic. I will need to review allergies to hydrochlorothiazide and discuss further with patient pending return phone call before starting.  I will plan to see patient back in 6-8 weeks in collaboration to other oncology appointments.    Patient expressed understanding and was in agreement with this plan. She also understands that She can call the clinic at any time with any questions, concerns, or complaints.   Any controlled substances utilized were prescribed in the context of palliative care. PDMP has been reviewed.    Visit consisted of counseling and education dealing with the complex and emotionally intense issues of symptom management and palliative care in  the setting of serious and potentially life-threatening illness.Greater than 50%  of this time was spent counseling and coordinating care related to the above assessment and plan.  Sonya Franklin, AGPCNP-BC  Palliative Medicine Team/[facility]  *Please note that this is a verbal dictation therefore any spelling or grammatical errors are due to the "Dragon [employer] One" system interpretation.

## 2022-09-09 ENCOUNTER — Inpatient Hospital Stay (HOSPITAL_BASED_OUTPATIENT_CLINIC_OR_DEPARTMENT_OTHER): Payer: Medicare Other | Admitting: Nurse Practitioner

## 2022-09-09 ENCOUNTER — Inpatient Hospital Stay: Payer: Medicare Other

## 2022-09-09 ENCOUNTER — Ambulatory Visit: Payer: Medicare Other | Admitting: Hematology and Oncology

## 2022-09-09 ENCOUNTER — Encounter: Payer: Self-pay | Admitting: Nurse Practitioner

## 2022-09-09 ENCOUNTER — Other Ambulatory Visit: Payer: Self-pay

## 2022-09-09 ENCOUNTER — Inpatient Hospital Stay: Payer: Medicare Other | Admitting: Hematology and Oncology

## 2022-09-09 VITALS — BP 127/57 | HR 78 | Temp 97.9°F | Resp 17 | Wt 179.8 lb

## 2022-09-09 DIAGNOSIS — C9000 Multiple myeloma not having achieved remission: Secondary | ICD-10-CM | POA: Diagnosis not present

## 2022-09-09 DIAGNOSIS — G893 Neoplasm related pain (acute) (chronic): Secondary | ICD-10-CM | POA: Diagnosis not present

## 2022-09-09 DIAGNOSIS — R530 Neoplastic (malignant) related fatigue: Secondary | ICD-10-CM

## 2022-09-09 DIAGNOSIS — Z515 Encounter for palliative care: Secondary | ICD-10-CM

## 2022-09-09 DIAGNOSIS — Z5112 Encounter for antineoplastic immunotherapy: Secondary | ICD-10-CM | POA: Diagnosis not present

## 2022-09-09 LAB — CBC WITH DIFFERENTIAL (CANCER CENTER ONLY)
Abs Immature Granulocytes: 0.04 10*3/uL (ref 0.00–0.07)
Basophils Absolute: 0 10*3/uL (ref 0.0–0.1)
Basophils Relative: 0 %
Eosinophils Absolute: 0.2 10*3/uL (ref 0.0–0.5)
Eosinophils Relative: 4 %
HCT: 32.2 % — ABNORMAL LOW (ref 36.0–46.0)
Hemoglobin: 10.3 g/dL — ABNORMAL LOW (ref 12.0–15.0)
Immature Granulocytes: 1 %
Lymphocytes Relative: 21 %
Lymphs Abs: 1 10*3/uL (ref 0.7–4.0)
MCH: 28.1 pg (ref 26.0–34.0)
MCHC: 32 g/dL (ref 30.0–36.0)
MCV: 87.7 fL (ref 80.0–100.0)
Monocytes Absolute: 0.5 10*3/uL (ref 0.1–1.0)
Monocytes Relative: 11 %
Neutro Abs: 3.1 10*3/uL (ref 1.7–7.7)
Neutrophils Relative %: 63 %
Platelet Count: 240 10*3/uL (ref 150–400)
RBC: 3.67 MIL/uL — ABNORMAL LOW (ref 3.87–5.11)
RDW: 15.4 % (ref 11.5–15.5)
WBC Count: 4.9 10*3/uL (ref 4.0–10.5)
nRBC: 0 % (ref 0.0–0.2)

## 2022-09-09 LAB — CMP (CANCER CENTER ONLY)
ALT: 15 U/L (ref 0–44)
AST: 12 U/L — ABNORMAL LOW (ref 15–41)
Albumin: 3.8 g/dL (ref 3.5–5.0)
Alkaline Phosphatase: 71 U/L (ref 38–126)
Anion gap: 7 (ref 5–15)
BUN: 9 mg/dL (ref 6–20)
CO2: 26 mmol/L (ref 22–32)
Calcium: 8.9 mg/dL (ref 8.9–10.3)
Chloride: 108 mmol/L (ref 98–111)
Creatinine: 0.65 mg/dL (ref 0.44–1.00)
GFR, Estimated: >60 mL/min (ref >60)
Glucose, Bld: 106 mg/dL — ABNORMAL HIGH (ref 70–99)
Potassium: 3.6 mmol/L (ref 3.5–5.1)
Sodium: 141 mmol/L (ref 135–145)
Total Bilirubin: 0.4 mg/dL (ref 0.3–1.2)
Total Protein: 6.4 g/dL — ABNORMAL LOW (ref 6.5–8.1)

## 2022-09-09 MED ORDER — DEXAMETHASONE 4 MG PO TABS
20.0000 mg | ORAL_TABLET | Freq: Once | ORAL | Status: AC
  Administered 2022-09-09: 20 mg via ORAL
  Filled 2022-09-09: qty 5

## 2022-09-09 MED ORDER — BORTEZOMIB CHEMO SQ INJECTION 3.5 MG (2.5MG/ML)
1.3000 mg/m2 | Freq: Once | INTRAMUSCULAR | Status: AC
  Administered 2022-09-09: 2.5 mg via SUBCUTANEOUS
  Filled 2022-09-09: qty 1

## 2022-09-09 MED ORDER — SODIUM CHLORIDE 0.9 % IV SOLN: INTRAVENOUS | Status: AC

## 2022-09-10 ENCOUNTER — Other Ambulatory Visit: Payer: Self-pay | Admitting: *Deleted

## 2022-09-10 ENCOUNTER — Encounter: Payer: Self-pay | Admitting: Hematology and Oncology

## 2022-09-10 DIAGNOSIS — C9000 Multiple myeloma not having achieved remission: Secondary | ICD-10-CM

## 2022-09-10 LAB — KAPPA/LAMBDA LIGHT CHAINS
Kappa free light chain: 14.1 mg/L (ref 3.3–19.4)
Kappa, lambda light chain ratio: 1.38 (ref 0.26–1.65)
Lambda free light chains: 10.2 mg/L (ref 5.7–26.3)

## 2022-09-10 MED ORDER — LENALIDOMIDE 25 MG PO CAPS
25.0000 mg | ORAL_CAPSULE | Freq: Every day | ORAL | 0 refills | Status: DC
Start: 2022-09-10 — End: 2022-10-02

## 2022-09-11 ENCOUNTER — Other Ambulatory Visit: Payer: Self-pay | Admitting: Nurse Practitioner

## 2022-09-11 DIAGNOSIS — C9000 Multiple myeloma not having achieved remission: Secondary | ICD-10-CM

## 2022-09-11 DIAGNOSIS — G893 Neoplasm related pain (acute) (chronic): Secondary | ICD-10-CM

## 2022-09-11 MED ORDER — CELECOXIB 100 MG PO CAPS
100.0000 mg | ORAL_CAPSULE | Freq: Two times a day (BID) | ORAL | 0 refills | Status: DC
Start: 2022-09-11 — End: 2022-12-24

## 2022-09-14 LAB — MULTIPLE MYELOMA PANEL, SERUM
Albumin SerPl Elph-Mcnc: 3.5 g/dL (ref 2.9–4.4)
Albumin/Glob SerPl: 1.5 (ref 0.7–1.7)
Alpha 1: 0.2 g/dL (ref 0.0–0.4)
Alpha2 Glob SerPl Elph-Mcnc: 0.7 g/dL (ref 0.4–1.0)
B-Globulin SerPl Elph-Mcnc: 1.1 g/dL (ref 0.7–1.3)
Gamma Glob SerPl Elph-Mcnc: 0.5 g/dL (ref 0.4–1.8)
Globulin, Total: 2.5 g/dL (ref 2.2–3.9)
IgA: 51 mg/dL — ABNORMAL LOW (ref 87–352)
IgG (Immunoglobin G), Serum: 571 mg/dL — ABNORMAL LOW (ref 586–1602)
IgM (Immunoglobulin M), Srm: 18 mg/dL — ABNORMAL LOW (ref 26–217)
Total Protein ELP: 6 g/dL (ref 6.0–8.5)

## 2022-09-16 ENCOUNTER — Inpatient Hospital Stay: Payer: Medicare Other | Attending: Physician Assistant

## 2022-09-16 ENCOUNTER — Other Ambulatory Visit: Payer: Self-pay

## 2022-09-16 ENCOUNTER — Telehealth: Payer: Self-pay | Admitting: *Deleted

## 2022-09-16 ENCOUNTER — Inpatient Hospital Stay: Payer: Medicare Other

## 2022-09-16 VITALS — BP 136/63 | HR 68 | Temp 99.2°F | Resp 18

## 2022-09-16 DIAGNOSIS — Z7982 Long term (current) use of aspirin: Secondary | ICD-10-CM | POA: Insufficient documentation

## 2022-09-16 DIAGNOSIS — Z791 Long term (current) use of non-steroidal anti-inflammatories (NSAID): Secondary | ICD-10-CM | POA: Diagnosis not present

## 2022-09-16 DIAGNOSIS — C9000 Multiple myeloma not having achieved remission: Secondary | ICD-10-CM | POA: Insufficient documentation

## 2022-09-16 DIAGNOSIS — Z79899 Other long term (current) drug therapy: Secondary | ICD-10-CM | POA: Insufficient documentation

## 2022-09-16 DIAGNOSIS — M459 Ankylosing spondylitis of unspecified sites in spine: Secondary | ICD-10-CM | POA: Diagnosis not present

## 2022-09-16 DIAGNOSIS — M25552 Pain in left hip: Secondary | ICD-10-CM | POA: Diagnosis not present

## 2022-09-16 DIAGNOSIS — I10 Essential (primary) hypertension: Secondary | ICD-10-CM | POA: Diagnosis not present

## 2022-09-16 DIAGNOSIS — E559 Vitamin D deficiency, unspecified: Secondary | ICD-10-CM | POA: Insufficient documentation

## 2022-09-16 DIAGNOSIS — G893 Neoplasm related pain (acute) (chronic): Secondary | ICD-10-CM | POA: Insufficient documentation

## 2022-09-16 DIAGNOSIS — Z5112 Encounter for antineoplastic immunotherapy: Secondary | ICD-10-CM | POA: Diagnosis not present

## 2022-09-16 DIAGNOSIS — K219 Gastro-esophageal reflux disease without esophagitis: Secondary | ICD-10-CM | POA: Insufficient documentation

## 2022-09-16 DIAGNOSIS — R002 Palpitations: Secondary | ICD-10-CM | POA: Diagnosis not present

## 2022-09-16 DIAGNOSIS — K449 Diaphragmatic hernia without obstruction or gangrene: Secondary | ICD-10-CM | POA: Insufficient documentation

## 2022-09-16 DIAGNOSIS — Z7961 Long term (current) use of immunomodulator: Secondary | ICD-10-CM | POA: Diagnosis not present

## 2022-09-16 DIAGNOSIS — Z803 Family history of malignant neoplasm of breast: Secondary | ICD-10-CM | POA: Insufficient documentation

## 2022-09-16 DIAGNOSIS — H538 Other visual disturbances: Secondary | ICD-10-CM | POA: Insufficient documentation

## 2022-09-16 DIAGNOSIS — M25551 Pain in right hip: Secondary | ICD-10-CM | POA: Insufficient documentation

## 2022-09-16 DIAGNOSIS — E0590 Thyrotoxicosis, unspecified without thyrotoxic crisis or storm: Secondary | ICD-10-CM | POA: Insufficient documentation

## 2022-09-16 LAB — CMP (CANCER CENTER ONLY)
ALT: 19 U/L (ref 0–44)
AST: 11 U/L — ABNORMAL LOW (ref 15–41)
Albumin: 3.8 g/dL (ref 3.5–5.0)
Alkaline Phosphatase: 64 U/L (ref 38–126)
Anion gap: 9 (ref 5–15)
BUN: 9 mg/dL (ref 6–20)
CO2: 27 mmol/L (ref 22–32)
Calcium: 9.1 mg/dL (ref 8.9–10.3)
Chloride: 108 mmol/L (ref 98–111)
Creatinine: 0.73 mg/dL (ref 0.44–1.00)
GFR, Estimated: >60 mL/min (ref >60)
Glucose, Bld: 114 mg/dL — ABNORMAL HIGH (ref 70–99)
Potassium: 3.6 mmol/L (ref 3.5–5.1)
Sodium: 144 mmol/L (ref 135–145)
Total Bilirubin: 0.6 mg/dL (ref 0.3–1.2)
Total Protein: 6 g/dL — ABNORMAL LOW (ref 6.5–8.1)

## 2022-09-16 LAB — CBC WITH DIFFERENTIAL (CANCER CENTER ONLY)
Abs Immature Granulocytes: 0.03 10*3/uL (ref 0.00–0.07)
Basophils Absolute: 0 10*3/uL (ref 0.0–0.1)
Basophils Relative: 1 %
Eosinophils Absolute: 0.2 10*3/uL (ref 0.0–0.5)
Eosinophils Relative: 4 %
HCT: 34.6 % — ABNORMAL LOW (ref 36.0–46.0)
Hemoglobin: 11.2 g/dL — ABNORMAL LOW (ref 12.0–15.0)
Immature Granulocytes: 1 %
Lymphocytes Relative: 22 %
Lymphs Abs: 1.2 10*3/uL (ref 0.7–4.0)
MCH: 28.5 pg (ref 26.0–34.0)
MCHC: 32.4 g/dL (ref 30.0–36.0)
MCV: 88 fL (ref 80.0–100.0)
Monocytes Absolute: 1.1 10*3/uL — ABNORMAL HIGH (ref 0.1–1.0)
Monocytes Relative: 20 %
Neutro Abs: 2.9 10*3/uL (ref 1.7–7.7)
Neutrophils Relative %: 52 %
Platelet Count: 179 10*3/uL (ref 150–400)
RBC: 3.93 MIL/uL (ref 3.87–5.11)
RDW: 15.3 % (ref 11.5–15.5)
WBC Count: 5.5 10*3/uL (ref 4.0–10.5)
nRBC: 0 % (ref 0.0–0.2)

## 2022-09-16 MED ORDER — BORTEZOMIB CHEMO SQ INJECTION 3.5 MG (2.5MG/ML)
1.3000 mg/m2 | Freq: Once | INTRAMUSCULAR | Status: AC
  Administered 2022-09-16: 2.5 mg via SUBCUTANEOUS
  Filled 2022-09-16: qty 1

## 2022-09-16 MED ORDER — SODIUM CHLORIDE 0.9 % IV SOLN: INTRAVENOUS | Status: AC

## 2022-09-16 MED ORDER — DEXAMETHASONE 4 MG PO TABS
20.0000 mg | ORAL_TABLET | Freq: Once | ORAL | Status: AC
  Administered 2022-09-16: 20 mg via ORAL
  Filled 2022-09-16: qty 5

## 2022-09-16 NOTE — Telephone Encounter (Signed)
TCT patient regarding her Revlimid refill. No answer but was able to leave vm message on her identified phone vm Advised that her Revlimid refill was sent in on 09/10/22. Advised that pt call [facility] regarding when they are sending this refill Provided phone # for [facility] Advised to call back with any questions or concerns.

## 2022-09-16 NOTE — Patient Instructions (Signed)
[facility]  Discharge Instructions: Thank you for choosing [facility] to provide your oncology and hematology care.   If you have a lab appointment with the Cancer Center, please go directly to the Cancer Center and check in at the registration area.   Wear comfortable clothing and clothing appropriate for easy access to any Portacath or PICC line.   We strive to give you quality time with your provider. You may need to reschedule your appointment if you arrive late (15 or more minutes).  Arriving late affects you and other patients whose appointments are after yours.  Also, if you miss three or more appointments without notifying the office, you may be dismissed from the clinic at the provider's discretion.      For prescription refill requests, have your pharmacy contact our office and allow 72 hours for refills to be completed.    Today you received the following chemotherapy and/or immunotherapy agents: Velcade     To help prevent nausea and vomiting after your treatment, we encourage you to take your nausea medication as directed.  BELOW ARE SYMPTOMS THAT SHOULD BE REPORTED IMMEDIATELY: *FEVER GREATER THAN 100.4 F (38 C) OR HIGHER *CHILLS OR SWEATING *NAUSEA AND VOMITING THAT IS NOT CONTROLLED WITH YOUR NAUSEA MEDICATION *UNUSUAL SHORTNESS OF BREATH *UNUSUAL BRUISING OR BLEEDING *URINARY PROBLEMS (pain or burning when urinating, or frequent urination) *BOWEL PROBLEMS (unusual diarrhea, constipation, pain near the anus) TENDERNESS IN MOUTH AND THROAT WITH OR WITHOUT PRESENCE OF ULCERS (sore throat, sores in mouth, or a toothache) UNUSUAL RASH, SWELLING OR PAIN  UNUSUAL VAGINAL DISCHARGE OR ITCHING   Items with * indicate a potential emergency and should be followed up as soon as possible or go to the Emergency Department if any problems should occur.  Please show the CHEMOTHERAPY ALERT CARD or IMMUNOTHERAPY ALERT CARD at check-in  to the Emergency Department and triage nurse.  Should you have questions after your visit or need to cancel or reschedule your appointment, please contact [facility]  Dept: [phone]  and follow the prompts.  Office hours are 8:00 a.m. to 4:30 p.m. Monday - Friday. Please note that voicemails left after 4:00 p.m. may not be returned until the following business day.  We are closed weekends and major holidays. You have access to a nurse at all times for urgent questions. Please call the main number to the clinic Dept: [phone] and follow the prompts.   For any non-urgent questions, you may also contact your provider using MyChart. We now offer e-Visits for anyone 18 and older to request care online for non-urgent symptoms. For details visit [email or web address].   Also download the MyChart app! Go to the app store, search "MyChart", open the app, select , and log in with your MyChart username and password.   

## 2022-09-22 ENCOUNTER — Other Ambulatory Visit: Payer: Self-pay | Admitting: Hematology and Oncology

## 2022-09-22 NOTE — Progress Notes (Unsigned)
[facility] [phone]   Fax:(336) [phone]  PROGRESS NOTE  Patient Care Team: Margaree Mackintosh, MD as PCP - General (Internal Medicine)  Hematological/Oncological History # Multiple Myeloma with Plasmacytoma 03/03/2022: established with the [facility] in Oncology  04/15/2022: CT biopsy and bone marrow biopsy confirmed the presence of a plasmacytoma and bone marrow involvement of multiple myeloma.  05/05/2022: Cycle 1 Day 1 of VRD therapy 05/27/2022: Cycle 2 Day 1 of VRD therapy 06/17/2022: Cycle 3 Day 1 of VRD therapy 07/08/2022: Cycle 4 Day 1 of VRD therapy 07/29/2022: Cycle 5 Day 1 of VRD therapy 08/19/2022: Cycle 6 Day 1 of VRD therapy 09/09/2022: Cycle 7 Day 1 of VRD therapy  Interval History:  Sonya Franklin 57 y.o. female with medical history significant for recently diagnosed multiple myeloma with plasmacytoma who presents for a follow up visit. The patient's last visit was on 09/01/2022. She presents today to start Cycle 7, Day 15 of VRD therapy.   On exam today Sonya Franklin reports she continues to have pain in her feet.  She notes it is a lingering pain from her feet up to her hips and back.  She notes that it did start before the Velcade but that the Velcade has been a little worse.  She reports that is her #1 issue.  She is also been having a little bit of issues with some blurry vision and fogginess.  She notes that she does not have any swelling of her eyelids, styes, or signs of blepharitis.  She reports that Benadryl does seem to help with her symptoms.  Otherwise she reports that she is tolerating treatment well and is willing and able to proceed at this time.  She denies fevers, chills, sweats, shortness of breath, chest pain or cough. She has no other complaints. Rest of the 10 point ROS is below.   The patient had the opportunity to meet with her and discuss bone marrow transplant with [facility].  We discussed options for treatment  moving forward and the patient notes that she would prefer maintenance therapy over bone marrow transplant.  We made [facility] aware of her decision.  MEDICAL HISTORY:  Past Medical History:  Diagnosis Date   Ankylosing spondylitis (HCC)    Anxiety    Arthritis    Breast cancer (HCC)    right breast   Bursitis of hip, right    Cardiac arrhythmia    Carotid stenosis 08/26/2021   GE reflux    Hiatal hernia    Hypertension    Hyperthyroidism    Ischemic bowel disease (HCC)    Multiple myeloma not having achieved remission (HCC)    Prediabetes 08/26/2021   Snoring 03/21/2014   Vitamin D deficiency     SURGICAL HISTORY: Past Surgical History:  Procedure Laterality Date   ABDOMINAL HYSTERECTOMY     BREAST LUMPECTOMY     right   MASTECTOMY     double    SOCIAL HISTORY: Social History   Socioeconomic History   Marital status: Divorced    Spouse name: Not on file   Number of children: 1   Years of education: Not on file   Highest education level: Not on file  Occupational History    Employer: UNEMPLOYED  Tobacco Use   Smoking status: Never   Smokeless tobacco: Never  Vaping Use   Vaping Use: Never used  Substance and Sexual Activity   Alcohol use: No   Drug use: No  Sexual activity: Yes  Other Topics Concern   Not on file  Social History Narrative   Caffeine occasional drinker.   Not working/disabled.  12th grader. Divorced, one kids.       Social history: She receives disability benefits for history of breast cancer 2007 and ankylosing spondylitis.  She resides with special needs son.  She is divorced.  Non-smoker.  No alcohol consumption.       Family history: Father with history of prostate cancer.  Patient's mother was diagnosed with breast cancer at age 14.  Maternal aunt with history of breast cancer.  3 brothers.   Right handed   Caffeine occas   One level stays on this level       Social Determinants of Health   Financial Resource Strain: Low  Risk  (08/26/2021)   Overall Financial Resource Strain (CARDIA)    Difficulty of Paying Living Expenses: Not hard at all  Food Insecurity: No Food Insecurity (05/25/2022)   Hunger Vital Sign    Worried About Running Out of Food in the Last Year: Never true    Ran Out of Food in the Last Year: Never true  Transportation Needs: No Transportation Needs (08/26/2021)   PRAPARE - Administrator, Civil Service (Medical): No    Lack of Transportation (Non-Medical): No  Physical Activity: Inactive (08/26/2021)   Exercise Vital Sign    Days of Exercise per Week: 0 days    Minutes of Exercise per Session: 0 min  Stress: Not on file  Social Connections: Not on file  Intimate Partner Violence: Not At Risk (04/21/2022)   Humiliation, Afraid, Rape, and Kick questionnaire    Fear of Current or Ex-Partner: No    Emotionally Abused: No    Physically Abused: No    Sexually Abused: No    FAMILY HISTORY: Family History  Problem Relation Age of Onset   Thyroid disease Mother    Diabetes Mother    Hypertension Mother    Hyperlipidemia Mother    High blood pressure Father    High Cholesterol Father    Colitis Brother    Goiter Maternal Grandmother    Breast cancer Maternal Aunt     ALLERGIES:  is allergic to shellfish allergy, blue dyes (parenteral), and hydrochlorothiazide.  MEDICATIONS:  Current Outpatient Medications  Medication Sig Dispense Refill   celecoxib (CELEBREX) 100 MG capsule Take 1 capsule (100 mg total) by mouth 2 (two) times daily. 30 capsule 0   acyclovir (ZOVIRAX) 400 MG tablet TAKE 1 TABLET BY MOUTH TWICE A DAY 180 tablet 1   amLODipine (NORVASC) 5 MG tablet TAKE 1 TABLET (5 MG TOTAL) BY MOUTH DAILY. 90 tablet 2   aspirin EC 81 MG tablet Take 81 mg by mouth daily. Swallow whole.     furosemide (LASIX) 20 MG tablet Take 1 tablet (20 mg total) by mouth daily as needed. 90 tablet 0   HYDROcodone bit-homatropine (HYCODAN) 5-1.5 MG/5ML syrup Take 5 mLs by mouth every 8  (eight) hours as needed for cough. 120 mL 0   hyoscyamine (ANASPAZ) 0.125 MG TBDP disintergrating tablet Place 1 tablet (0.125 mg total) under the tongue every 4 (four) hours as needed for cramping. 30 tablet 0   KLOR-CON M20 20 MEQ tablet TAKE 2 TABLETS BY MOUTH DAILY 180 tablet 1   lenalidomide (REVLIMID) 25 MG capsule Take 1 capsule (25 mg total) by mouth daily. Take for 14 days on, then 7 days off. Repeat every 21 days. Auth #  [id number]  Date obtained 09/10/22 14 capsule 0   lidocaine (LIDODERM) 5 % Place 1 patch onto the skin daily. Remove & Discard patch within 12 hours or as directed by MD 30 patch 1   metoprolol tartrate (LOPRESSOR) 25 MG tablet ONE HALF TAB BY MOUTH UP TO TWICE DAILY IF NEEDED FOR PALPITATIONS 90 tablet 1   nystatin (MYCOSTATIN) 100000 UNIT/ML suspension Take 5 mLs (500,000 Units total) by mouth 4 (four) times daily. 60 mL 0   olopatadine (PATANOL) 0.1 % ophthalmic solution Place 1 drop into both eyes 2 (two) times daily as needed. 5 mL PRN   pantoprazole (PROTONIX) 40 MG tablet TAKE 1 TABLET BY MOUTH EVERY DAY 90 tablet 3   traMADol (ULTRAM) 50 MG tablet Take 1 tablet (50 mg total) by mouth every 8 (eight) hours as needed for moderate pain. 30 tablet 0   triamcinolone cream (KENALOG) 0.1 % Apply 1 Application topically 3 (three) times daily. (Patient taking differently: Apply 1 Application topically 3 (three) times daily. As needed) 30 g 1   No current facility-administered medications for this visit.    REVIEW OF SYSTEMS:   Constitutional: ( - ) fevers, ( - )  chills , ( - ) night sweats Eyes: ( - ) blurriness of vision, ( - ) double vision, ( - ) watery eyes Ears, nose, mouth, throat, and face: ( - ) mucositis, ( - ) sore throat Respiratory: ( - ) cough, ( - ) dyspnea, ( - ) wheezes Cardiovascular: ( - ) palpitation, ( - ) chest discomfort, ( + ) lower extremity swelling Gastrointestinal:  ( - ) nausea, ( - ) heartburn, ( - ) change in bowel habits Skin: ( - )  abnormal skin rashes Lymphatics: ( - ) new lymphadenopathy, ( - ) easy bruising Neurological: ( - ) numbness, ( - ) tingling, ( - ) new weaknesses Behavioral/Psych: ( - ) mood change, ( - ) new changes  All other systems were reviewed with the patient and are negative.  PHYSICAL EXAMINATION: ECOG PERFORMANCE STATUS: 1 - Symptomatic but completely ambulatory  Vitals:   09/23/22 1136  BP: 139/62  Pulse: 69  Resp: 13  Temp: 97.8 F (36.6 C)  SpO2: 100%    Filed Weights   09/23/22 1136  Weight: 178 lb 4.8 oz (80.9 kg)     GENERAL: Well-appearing middle-aged African-American female, alert, no distress and comfortable SKIN: skin color, texture, turgor are normal, no rashes or significant lesions EYES: conjunctiva are pink and non-injected, sclera clear LUNGS: clear to auscultation and percussion with normal breathing effort HEART: regular rate & rhythm and no murmurs. Mild bilateral lower extremity edema Musculoskeletal: no cyanosis of digits and no clubbing  PSYCH: alert & oriented x 3, fluent speech NEURO: no focal motor/sensory deficits  LABORATORY DATA:  I have reviewed the data as listed    Latest Ref Rng & Units 09/23/2022   11:09 AM 09/16/2022    7:58 AM 09/09/2022   10:32 AM  CBC  WBC 4.0 - 10.5 K/uL 5.2  5.5  4.9   Hemoglobin 12.0 - 15.0 g/dL 54.0  98.1  19.1   Hematocrit 36.0 - 46.0 % 32.6  34.6  32.2   Platelets 150 - 400 K/uL 188  179  240        Latest Ref Rng & Units 09/23/2022   11:09 AM 09/16/2022    7:58 AM 09/09/2022   10:32 AM  CMP  Glucose 70 - 99 mg/dL 84  114  106   BUN 6 - 20 mg/dL 11  9  9    Creatinine 0.44 - 1.00 mg/dL 6.38  7.56  4.33   Sodium 135 - 145 mmol/L 141  144  141   Potassium 3.5 - 5.1 mmol/L 3.8  3.6  3.6   Chloride 98 - 111 mmol/L 109  108  108   CO2 22 - 32 mmol/L 27  27  26    Calcium 8.9 - 10.3 mg/dL 9.3  9.1  8.9   Total Protein 6.5 - 8.1 g/dL 6.7  6.0  6.4   Total Bilirubin 0.3 - 1.2 mg/dL 0.4  0.6  0.4   Alkaline Phos 38 -  126 U/L 72  64  71   AST 15 - 41 U/L 10  11  12    ALT 0 - 44 U/L 13  19  15      Lab Results  Component Value Date   MPROTEIN Not Observed 09/09/2022   MPROTEIN Not Observed 08/19/2022   MPROTEIN Not Observed 07/29/2022   Lab Results  Component Value Date   KPAFRELGTCHN 14.1 09/09/2022   KPAFRELGTCHN 14.1 08/19/2022   KPAFRELGTCHN 13.9 07/29/2022   LAMBDASER 10.2 09/09/2022   LAMBDASER 13.2 08/19/2022   LAMBDASER 10.3 07/29/2022   KAPLAMBRATIO 1.38 09/09/2022   KAPLAMBRATIO 1.07 08/19/2022   KAPLAMBRATIO 1.35 07/29/2022     RADIOGRAPHIC STUDIES: No results found.  ASSESSMENT & PLAN Sonya Franklin is a 57 y.o. female with medical history significant for newly diagnosed multiple myeloma with plasmacytoma who presents for a follow up visit.  # Multiple Myeloma with Plasmactyoma --Confirmed with L5 bone lesion biopsy and bone marrow biopsy. --Started VRD therapy on 05/05/2022.  PLAN: --Due for Cycle 7 Day 15 of VRD therapy today.  --Labs today reviewed and adequate for treatment.  Labs show white blood cell count 5.2, hemoglobin 10.9, MCV 87.6, and platelets of 188. Creatinine normal.  --Last myeloma labs from 07/29/2022 showed M protein was undetectable with normal serum free light chains.  --Patient completed her consultation with [facility] on 08/13/2022.  She notes that she would prefer to pursue maintenance therapy over bone marrow transplant. --Continue with weekly treatment and q 2 week office visits.  After cycle 8 will consider transitioning to maintenance therapy.  # Jitteriness/Palpitations: --Likely secondary to steroid therapy versus abnormal thyroid function, symptoms improving with thyroid management.  --continue 20mg  PO dexamethasone.   # Right lower extremity neuropathy: --Greatly improved --Monitor for now.   # Cancer related pain: --Secondary to focal tumor replacement of the right-side of the L5 vertebral body extending into the right L5  pedicle and posterior elements with a small extraosseous component extending into the right L5 neural foramen with moderate-severe stenosi -- Unable to tolerate opoid medications -- Under the care of pallative care. Medication includes lidocaine pain patch and tylenol with minimal relief. . --Patient has agreed to repeat MRI lumbar spine to reassess bone lesions and see if she is a candidate for palliative radiation.   #Supportive Care -- chemotherapy education completed -- port placement not required.  -- zofran 8mg  q8H PRN and compazine 10mg  PO q6H for nausea -- acyclovir 400mg  PO BID for VCZ prophylaxis -- Pain medication as noted above  No orders of the defined types were placed in this encounter.  All questions were answered. The patient knows to call the clinic with any problems, questions or concerns.  A total of more than 30 minutes were spent on this  encounter with face-to-face time and non-face-to-face time, including preparing to see the patient, ordering tests and/or medications, counseling the patient and coordination of care as outlined above.   Ulysees Barns, MD Department of Hematology/Oncology [facility] Phone: [phone] Pager: [phone] Email: [email or web address]  09/23/2022 6:09 PM

## 2022-09-23 ENCOUNTER — Other Ambulatory Visit: Payer: Self-pay

## 2022-09-23 ENCOUNTER — Inpatient Hospital Stay: Payer: Medicare Other | Admitting: Hematology and Oncology

## 2022-09-23 ENCOUNTER — Inpatient Hospital Stay: Payer: Medicare Other

## 2022-09-23 VITALS — BP 148/66 | HR 75

## 2022-09-23 VITALS — BP 139/62 | HR 69 | Temp 97.8°F | Resp 13 | Wt 178.3 lb

## 2022-09-23 DIAGNOSIS — C9000 Multiple myeloma not having achieved remission: Secondary | ICD-10-CM

## 2022-09-23 DIAGNOSIS — H538 Other visual disturbances: Secondary | ICD-10-CM | POA: Diagnosis not present

## 2022-09-23 DIAGNOSIS — Z791 Long term (current) use of non-steroidal anti-inflammatories (NSAID): Secondary | ICD-10-CM | POA: Diagnosis not present

## 2022-09-23 DIAGNOSIS — E559 Vitamin D deficiency, unspecified: Secondary | ICD-10-CM | POA: Diagnosis not present

## 2022-09-23 DIAGNOSIS — E0590 Thyrotoxicosis, unspecified without thyrotoxic crisis or storm: Secondary | ICD-10-CM | POA: Diagnosis not present

## 2022-09-23 DIAGNOSIS — M25551 Pain in right hip: Secondary | ICD-10-CM | POA: Diagnosis not present

## 2022-09-23 DIAGNOSIS — Z803 Family history of malignant neoplasm of breast: Secondary | ICD-10-CM | POA: Diagnosis not present

## 2022-09-23 DIAGNOSIS — M25552 Pain in left hip: Secondary | ICD-10-CM | POA: Diagnosis not present

## 2022-09-23 DIAGNOSIS — M459 Ankylosing spondylitis of unspecified sites in spine: Secondary | ICD-10-CM | POA: Diagnosis not present

## 2022-09-23 DIAGNOSIS — G893 Neoplasm related pain (acute) (chronic): Secondary | ICD-10-CM | POA: Diagnosis not present

## 2022-09-23 DIAGNOSIS — I10 Essential (primary) hypertension: Secondary | ICD-10-CM | POA: Diagnosis not present

## 2022-09-23 DIAGNOSIS — Z5112 Encounter for antineoplastic immunotherapy: Secondary | ICD-10-CM | POA: Diagnosis not present

## 2022-09-23 DIAGNOSIS — K449 Diaphragmatic hernia without obstruction or gangrene: Secondary | ICD-10-CM | POA: Diagnosis not present

## 2022-09-23 DIAGNOSIS — K219 Gastro-esophageal reflux disease without esophagitis: Secondary | ICD-10-CM | POA: Diagnosis not present

## 2022-09-23 DIAGNOSIS — Z79899 Other long term (current) drug therapy: Secondary | ICD-10-CM | POA: Diagnosis not present

## 2022-09-23 DIAGNOSIS — R002 Palpitations: Secondary | ICD-10-CM | POA: Diagnosis not present

## 2022-09-23 DIAGNOSIS — Z7961 Long term (current) use of immunomodulator: Secondary | ICD-10-CM | POA: Diagnosis not present

## 2022-09-23 DIAGNOSIS — Z7982 Long term (current) use of aspirin: Secondary | ICD-10-CM | POA: Diagnosis not present

## 2022-09-23 LAB — CBC WITH DIFFERENTIAL (CANCER CENTER ONLY)
Abs Immature Granulocytes: 0.02 10*3/uL (ref 0.00–0.07)
Basophils Absolute: 0.1 10*3/uL (ref 0.0–0.1)
Basophils Relative: 1 %
Eosinophils Absolute: 0.2 10*3/uL (ref 0.0–0.5)
Eosinophils Relative: 4 %
HCT: 32.6 % — ABNORMAL LOW (ref 36.0–46.0)
Hemoglobin: 10.9 g/dL — ABNORMAL LOW (ref 12.0–15.0)
Immature Granulocytes: 0 %
Lymphocytes Relative: 20 %
Lymphs Abs: 1 10*3/uL (ref 0.7–4.0)
MCH: 29.3 pg (ref 26.0–34.0)
MCHC: 33.4 g/dL (ref 30.0–36.0)
MCV: 87.6 fL (ref 80.0–100.0)
Monocytes Absolute: 1.3 10*3/uL — ABNORMAL HIGH (ref 0.1–1.0)
Monocytes Relative: 24 %
Neutro Abs: 2.7 10*3/uL (ref 1.7–7.7)
Neutrophils Relative %: 51 %
Platelet Count: 188 10*3/uL (ref 150–400)
RBC: 3.72 MIL/uL — ABNORMAL LOW (ref 3.87–5.11)
RDW: 15.9 % — ABNORMAL HIGH (ref 11.5–15.5)
WBC Count: 5.2 10*3/uL (ref 4.0–10.5)
nRBC: 0 % (ref 0.0–0.2)

## 2022-09-23 LAB — CMP (CANCER CENTER ONLY)
ALT: 13 U/L (ref 0–44)
AST: 10 U/L — ABNORMAL LOW (ref 15–41)
Albumin: 3.9 g/dL (ref 3.5–5.0)
Alkaline Phosphatase: 72 U/L (ref 38–126)
Anion gap: 5 (ref 5–15)
BUN: 11 mg/dL (ref 6–20)
CO2: 27 mmol/L (ref 22–32)
Calcium: 9.3 mg/dL (ref 8.9–10.3)
Chloride: 109 mmol/L (ref 98–111)
Creatinine: 0.65 mg/dL (ref 0.44–1.00)
GFR, Estimated: >60 mL/min (ref >60)
Glucose, Bld: 84 mg/dL (ref 70–99)
Potassium: 3.8 mmol/L (ref 3.5–5.1)
Sodium: 141 mmol/L (ref 135–145)
Total Bilirubin: 0.4 mg/dL (ref 0.3–1.2)
Total Protein: 6.7 g/dL (ref 6.5–8.1)

## 2022-09-23 MED ORDER — DEXAMETHASONE 4 MG PO TABS
20.0000 mg | ORAL_TABLET | Freq: Once | ORAL | Status: AC
  Administered 2022-09-23: 20 mg via ORAL
  Filled 2022-09-23: qty 5

## 2022-09-23 MED ORDER — SODIUM CHLORIDE 0.9 % IV SOLN: INTRAVENOUS | Status: AC

## 2022-09-23 MED ORDER — BORTEZOMIB CHEMO SQ INJECTION 3.5 MG (2.5MG/ML)
1.3000 mg/m2 | Freq: Once | INTRAMUSCULAR | Status: AC
  Administered 2022-09-23: 2.5 mg via SUBCUTANEOUS
  Filled 2022-09-23: qty 1

## 2022-09-23 NOTE — Patient Instructions (Signed)
[facility]  Discharge Instructions: Thank you for choosing [facility] to provide your oncology and hematology care.   If you have a lab appointment with the Cancer Center, please go directly to the Cancer Center and check in at the registration area.   Wear comfortable clothing and clothing appropriate for easy access to any Portacath or PICC line.   We strive to give you quality time with your provider. You may need to reschedule your appointment if you arrive late (15 or more minutes).  Arriving late affects you and other patients whose appointments are after yours.  Also, if you miss three or more appointments without notifying the office, you may be dismissed from the clinic at the provider's discretion.      For prescription refill requests, have your pharmacy contact our office and allow 72 hours for refills to be completed.    Today you received the following chemotherapy and/or immunotherapy agents: Velcade     To help prevent nausea and vomiting after your treatment, we encourage you to take your nausea medication as directed.  BELOW ARE SYMPTOMS THAT SHOULD BE REPORTED IMMEDIATELY: *FEVER GREATER THAN 100.4 F (38 C) OR HIGHER *CHILLS OR SWEATING *NAUSEA AND VOMITING THAT IS NOT CONTROLLED WITH YOUR NAUSEA MEDICATION *UNUSUAL SHORTNESS OF BREATH *UNUSUAL BRUISING OR BLEEDING *URINARY PROBLEMS (pain or burning when urinating, or frequent urination) *BOWEL PROBLEMS (unusual diarrhea, constipation, pain near the anus) TENDERNESS IN MOUTH AND THROAT WITH OR WITHOUT PRESENCE OF ULCERS (sore throat, sores in mouth, or a toothache) UNUSUAL RASH, SWELLING OR PAIN  UNUSUAL VAGINAL DISCHARGE OR ITCHING   Items with * indicate a potential emergency and should be followed up as soon as possible or go to the Emergency Department if any problems should occur.  Please show the CHEMOTHERAPY ALERT CARD or IMMUNOTHERAPY ALERT CARD at check-in  to the Emergency Department and triage nurse.  Should you have questions after your visit or need to cancel or reschedule your appointment, please contact [facility]  Dept: [phone]  and follow the prompts.  Office hours are 8:00 a.m. to 4:30 p.m. Monday - Friday. Please note that voicemails left after 4:00 p.m. may not be returned until the following business day.  We are closed weekends and major holidays. You have access to a nurse at all times for urgent questions. Please call the main number to the clinic Dept: [phone] and follow the prompts.   For any non-urgent questions, you may also contact your provider using MyChart. We now offer e-Visits for anyone 18 and older to request care online for non-urgent symptoms. For details visit [email or web address].   Also download the MyChart app! Go to the app store, search "MyChart", open the app, select [facility], and log in with your MyChart username and password.   

## 2022-09-24 ENCOUNTER — Telehealth: Payer: Self-pay | Admitting: Internal Medicine

## 2022-09-24 NOTE — Telephone Encounter (Signed)
Faxed form back to [employer] [phone], phone [phone] for CPAP orders   [id number]                -------Fax Transmission Report-------  To:               Recipient at [phone] Subject:          Fw: Hp Scans Result:           The transmission was successful. Explanation:      All Pages Ok Pages Sent:       3 Connect Time:     1 minutes, 19 seconds Transmit Time:    09/24/2022 14:54 Transfer Rate:    14400 Status Code:      0000 Retry Count:      0 Job Id:           [id number] Unique Id:        [id number] Fax Line:         6 Fax Server:       [employer]

## 2022-09-29 ENCOUNTER — Telehealth: Payer: Self-pay | Admitting: Internal Medicine

## 2022-09-29 NOTE — Telephone Encounter (Signed)
-------  Fax Transmission Report-------  To:               Recipient at [phone] Subject:          Fw: Hp Scans Result:           The transmission was successful. Explanation:      All Pages Ok Pages Sent:       4 Connect Time:     1 minutes, 25 seconds Transmit Time:    09/29/2022 14:38 Transfer Rate:    14400 Status Code:      0000 Retry Count:      0 Job Id:           [id number] Unique Id:        [id number] Fax Line:         28 Fax Server:       [id number]

## 2022-09-29 NOTE — Telephone Encounter (Signed)
Received a form to complete from  [employer] [phone] Fax [phone]  To order medical necessity Mastectomy bra Completed and signed -- Faxed Back

## 2022-09-30 ENCOUNTER — Inpatient Hospital Stay: Payer: Medicare Other

## 2022-09-30 ENCOUNTER — Other Ambulatory Visit: Payer: Self-pay

## 2022-09-30 VITALS — BP 137/68 | HR 72 | Temp 98.2°F | Resp 18

## 2022-09-30 DIAGNOSIS — Z5112 Encounter for antineoplastic immunotherapy: Secondary | ICD-10-CM | POA: Diagnosis not present

## 2022-09-30 DIAGNOSIS — C9000 Multiple myeloma not having achieved remission: Secondary | ICD-10-CM | POA: Diagnosis not present

## 2022-09-30 DIAGNOSIS — Z7982 Long term (current) use of aspirin: Secondary | ICD-10-CM | POA: Diagnosis not present

## 2022-09-30 DIAGNOSIS — I10 Essential (primary) hypertension: Secondary | ICD-10-CM | POA: Diagnosis not present

## 2022-09-30 DIAGNOSIS — E0590 Thyrotoxicosis, unspecified without thyrotoxic crisis or storm: Secondary | ICD-10-CM | POA: Diagnosis not present

## 2022-09-30 DIAGNOSIS — R002 Palpitations: Secondary | ICD-10-CM | POA: Diagnosis not present

## 2022-09-30 DIAGNOSIS — K219 Gastro-esophageal reflux disease without esophagitis: Secondary | ICD-10-CM | POA: Diagnosis not present

## 2022-09-30 DIAGNOSIS — M459 Ankylosing spondylitis of unspecified sites in spine: Secondary | ICD-10-CM | POA: Diagnosis not present

## 2022-09-30 DIAGNOSIS — H538 Other visual disturbances: Secondary | ICD-10-CM | POA: Diagnosis not present

## 2022-09-30 DIAGNOSIS — Z803 Family history of malignant neoplasm of breast: Secondary | ICD-10-CM | POA: Diagnosis not present

## 2022-09-30 DIAGNOSIS — K449 Diaphragmatic hernia without obstruction or gangrene: Secondary | ICD-10-CM | POA: Diagnosis not present

## 2022-09-30 DIAGNOSIS — G893 Neoplasm related pain (acute) (chronic): Secondary | ICD-10-CM | POA: Diagnosis not present

## 2022-09-30 DIAGNOSIS — Z79899 Other long term (current) drug therapy: Secondary | ICD-10-CM | POA: Diagnosis not present

## 2022-09-30 DIAGNOSIS — Z791 Long term (current) use of non-steroidal anti-inflammatories (NSAID): Secondary | ICD-10-CM | POA: Diagnosis not present

## 2022-09-30 DIAGNOSIS — M25551 Pain in right hip: Secondary | ICD-10-CM | POA: Diagnosis not present

## 2022-09-30 DIAGNOSIS — M25552 Pain in left hip: Secondary | ICD-10-CM | POA: Diagnosis not present

## 2022-09-30 DIAGNOSIS — E559 Vitamin D deficiency, unspecified: Secondary | ICD-10-CM | POA: Diagnosis not present

## 2022-09-30 DIAGNOSIS — Z7961 Long term (current) use of immunomodulator: Secondary | ICD-10-CM | POA: Diagnosis not present

## 2022-09-30 LAB — CMP (CANCER CENTER ONLY)
ALT: 15 U/L (ref 0–44)
AST: 11 U/L — ABNORMAL LOW (ref 15–41)
Albumin: 4 g/dL (ref 3.5–5.0)
Alkaline Phosphatase: 70 U/L (ref 38–126)
Anion gap: 8 (ref 5–15)
BUN: 10 mg/dL (ref 6–20)
CO2: 26 mmol/L (ref 22–32)
Calcium: 9.2 mg/dL (ref 8.9–10.3)
Chloride: 109 mmol/L (ref 98–111)
Creatinine: 0.71 mg/dL (ref 0.44–1.00)
GFR, Estimated: >60 mL/min (ref >60)
Glucose, Bld: 82 mg/dL (ref 70–99)
Potassium: 3.4 mmol/L — ABNORMAL LOW (ref 3.5–5.1)
Sodium: 143 mmol/L (ref 135–145)
Total Bilirubin: 0.5 mg/dL (ref 0.3–1.2)
Total Protein: 6.4 g/dL — ABNORMAL LOW (ref 6.5–8.1)

## 2022-09-30 LAB — CBC WITH DIFFERENTIAL (CANCER CENTER ONLY)
Abs Immature Granulocytes: 0.02 10*3/uL (ref 0.00–0.07)
Basophils Absolute: 0 10*3/uL (ref 0.0–0.1)
Basophils Relative: 1 %
Eosinophils Absolute: 0.3 10*3/uL (ref 0.0–0.5)
Eosinophils Relative: 7 %
HCT: 33.9 % — ABNORMAL LOW (ref 36.0–46.0)
Hemoglobin: 11 g/dL — ABNORMAL LOW (ref 12.0–15.0)
Immature Granulocytes: 1 %
Lymphocytes Relative: 29 %
Lymphs Abs: 1.2 10*3/uL (ref 0.7–4.0)
MCH: 28.4 pg (ref 26.0–34.0)
MCHC: 32.4 g/dL (ref 30.0–36.0)
MCV: 87.4 fL (ref 80.0–100.0)
Monocytes Absolute: 0.6 10*3/uL (ref 0.1–1.0)
Monocytes Relative: 14 %
Neutro Abs: 2.1 10*3/uL (ref 1.7–7.7)
Neutrophils Relative %: 48 %
Platelet Count: 187 10*3/uL (ref 150–400)
RBC: 3.88 MIL/uL (ref 3.87–5.11)
RDW: 15.6 % — ABNORMAL HIGH (ref 11.5–15.5)
WBC Count: 4.2 10*3/uL (ref 4.0–10.5)
nRBC: 0 % (ref 0.0–0.2)

## 2022-09-30 MED ORDER — SODIUM CHLORIDE 0.9 % IV SOLN: INTRAVENOUS | Status: AC

## 2022-09-30 MED ORDER — BORTEZOMIB CHEMO SQ INJECTION 3.5 MG (2.5MG/ML)
1.3000 mg/m2 | Freq: Once | INTRAMUSCULAR | Status: AC
  Administered 2022-09-30: 2.5 mg via SUBCUTANEOUS
  Filled 2022-09-30: qty 1

## 2022-09-30 MED ORDER — DEXAMETHASONE 4 MG PO TABS
20.0000 mg | ORAL_TABLET | Freq: Once | ORAL | Status: AC
  Administered 2022-09-30: 20 mg via ORAL
  Filled 2022-09-30: qty 5

## 2022-09-30 NOTE — Patient Instructions (Signed)
 [facility]  Discharge Instructions: Thank you for choosing [facility] to provide your oncology and hematology care.   If you have a lab appointment with the Cancer Center, please go directly to the Cancer Center and check in at the registration area.   Wear comfortable clothing and clothing appropriate for easy access to any Portacath or PICC line.   We strive to give you quality time with your provider. You may need to reschedule your appointment if you arrive late (15 or more minutes).  Arriving late affects you and other patients whose appointments are after yours.  Also, if you miss three or more appointments without notifying the office, you may be dismissed from the clinic at the provider's discretion.      For prescription refill requests, have your pharmacy contact our office and allow 72 hours for refills to be completed.    Today you received the following chemotherapy and/or immunotherapy agents: Velcade        To help prevent nausea and vomiting after your treatment, we encourage you to take your nausea medication as directed.  BELOW ARE SYMPTOMS THAT SHOULD BE REPORTED IMMEDIATELY: *FEVER GREATER THAN 100.4 F (38 C) OR HIGHER *CHILLS OR SWEATING *NAUSEA AND VOMITING THAT IS NOT CONTROLLED WITH YOUR NAUSEA MEDICATION *UNUSUAL SHORTNESS OF BREATH *UNUSUAL BRUISING OR BLEEDING *URINARY PROBLEMS (pain or burning when urinating, or frequent urination) *BOWEL PROBLEMS (unusual diarrhea, constipation, pain near the anus) TENDERNESS IN MOUTH AND THROAT WITH OR WITHOUT PRESENCE OF ULCERS (sore throat, sores in mouth, or a toothache) UNUSUAL RASH, SWELLING OR PAIN  UNUSUAL VAGINAL DISCHARGE OR ITCHING   Items with * indicate a potential emergency and should be followed up as soon as possible or go to the Emergency Department if any problems should occur.  Please show the CHEMOTHERAPY ALERT CARD or IMMUNOTHERAPY ALERT CARD at  check-in to the Emergency Department and triage nurse.  Should you have questions after your visit or need to cancel or reschedule your appointment, please contact [facility]  Dept: [phone]  and follow the prompts.  Office hours are 8:00 a.m. to 4:30 p.m. Monday - Friday. Please note that voicemails left after 4:00 p.m. may not be returned until the following business day.  We are closed weekends and major holidays. You have access to a nurse at all times for urgent questions. Please call the main number to the clinic Dept: [phone] and follow the prompts.   For any non-urgent questions, you may also contact your provider using MyChart. We now offer e-Visits for anyone 18 and older to request care online for non-urgent symptoms. For details visit [email or web address].   Also download the MyChart app! Go to the app store, search "MyChart", open the app, select [facility], and log in with your MyChart username and password.  

## 2022-10-01 LAB — KAPPA/LAMBDA LIGHT CHAINS
Kappa free light chain: 12.3 mg/L (ref 3.3–19.4)
Kappa, lambda light chain ratio: 1.43 (ref 0.26–1.65)
Lambda free light chains: 8.6 mg/L (ref 5.7–26.3)

## 2022-10-02 ENCOUNTER — Other Ambulatory Visit: Payer: Self-pay | Admitting: *Deleted

## 2022-10-02 DIAGNOSIS — C9000 Multiple myeloma not having achieved remission: Secondary | ICD-10-CM

## 2022-10-02 MED ORDER — LENALIDOMIDE 25 MG PO CAPS
25.0000 mg | ORAL_CAPSULE | Freq: Every day | ORAL | 0 refills | Status: DC
Start: 2022-10-02 — End: 2022-10-23

## 2022-10-04 LAB — MULTIPLE MYELOMA PANEL, SERUM
Albumin SerPl Elph-Mcnc: 3.4 g/dL (ref 2.9–4.4)
Albumin/Glob SerPl: 1.4 (ref 0.7–1.7)
Alpha 1: 0.2 g/dL (ref 0.0–0.4)
Alpha2 Glob SerPl Elph-Mcnc: 0.7 g/dL (ref 0.4–1.0)
B-Globulin SerPl Elph-Mcnc: 1 g/dL (ref 0.7–1.3)
Gamma Glob SerPl Elph-Mcnc: 0.5 g/dL (ref 0.4–1.8)
Globulin, Total: 2.5 g/dL (ref 2.2–3.9)
IgA: 44 mg/dL — ABNORMAL LOW (ref 87–352)
IgG (Immunoglobin G), Serum: 608 mg/dL (ref 586–1602)
IgM (Immunoglobulin M), Srm: 22 mg/dL — ABNORMAL LOW (ref 26–217)
Total Protein ELP: 5.9 g/dL — ABNORMAL LOW (ref 6.0–8.5)

## 2022-10-06 NOTE — Progress Notes (Unsigned)
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  05/05/2022: Cycle 1 Day 1 of VRD therapy 05/27/2022: Cycle 2 Day 1 of VRD therapy 06/17/2022: Cycle 3 Day 1 of VRD therapy 07/08/2022: Cycle 4 Day 1 of VRD therapy 07/29/2022: Cycle 5 Day 1 of VRD therapy 08/19/2022: Cycle 6 Day 1 of VRD therapy 09/09/2022: Cycle 7 Day 1 of VRD therapy 09/30/2022: Cycle 8 Day 1 of VRD therapy  Interval History:  Sonya Franklin 57 y.o. female with medical history significant for recently diagnosed multiple myeloma with plasmacytoma who presents for a follow up visit. The patient's last visit was on 09/23/2022. She presents today to start Cycle 8, Day 8 of VRD therapy.   On exam today Sonya Franklin reports she has been well overall in the interim since her last visit.  She notes that her major issue continues to be "feeling terrible from her chronic pain".  She is not having any other difficulties from her chemotherapy such as nausea, vomiting, or diarrhea.  She reports that she is eager to see if dropping the Velcade improves her pain.  Her weight has been stable and her blood pressure has been good.  Her energy levels and appetite have also been unfazed.  If it went for the pain she reports that she would be in excellent shape.  She denies fevers, chills, sweats, shortness of breath, chest pain or cough. She has no other complaints. Rest of the 10 point ROS is below.   The patient had the opportunity to meet with her and discuss bone marrow transplant with [facility].  We discussed options for treatment moving forward and the  patient notes that she would prefer maintenance therapy over bone marrow transplant.  We made [facility] aware of her decision.  MEDICAL HISTORY:  Past Medical History:  Diagnosis Date   Ankylosing spondylitis (HCC)    Anxiety    Arthritis    Breast cancer (HCC)    right breast   Bursitis of hip, right    Cardiac arrhythmia    Carotid stenosis 08/26/2021   GE reflux    Hiatal hernia    Hypertension    Hyperthyroidism    Ischemic bowel disease (HCC)    Multiple myeloma not having achieved remission (HCC)    Prediabetes 08/26/2021   Snoring 03/21/2014   Vitamin D deficiency     SURGICAL HISTORY: Past Surgical History:  Procedure Laterality Date   ABDOMINAL HYSTERECTOMY     BREAST LUMPECTOMY     right   MASTECTOMY     double    SOCIAL HISTORY: Social History   Socioeconomic History   Marital status: Divorced    Spouse name: Not on file   Number of children: 1   Years of education: Not on file   Highest education level: Not on file  Occupational History    Employer: UNEMPLOYED  Tobacco Use   Smoking status: Never   Smokeless tobacco: Never  Vaping Use   Vaping status: Never Used  Substance and Sexual Activity   Alcohol use: No   Drug use: No   Sexual activity: Yes  Other Topics Concern  Not on file  Social History Narrative   Caffeine occasional drinker.   Not working/disabled.  12th grader. Divorced, one kids.       Social history: She receives disability benefits for history of breast cancer 2007 and ankylosing spondylitis.  She resides with special needs son.  She is divorced.  Non-smoker.  No alcohol consumption.       Family history: Father with history of prostate cancer.  Patient's mother was diagnosed with breast cancer at age 50.  Maternal aunt with history of breast cancer.  3 brothers.   Right handed   Caffeine occas   One level stays on this level       Social Determinants of Health   Financial Resource Strain: Low Risk  (08/26/2021)    Overall Financial Resource Strain (CARDIA)    Difficulty of Paying Living Expenses: Not hard at all  Food Insecurity: No Food Insecurity (05/25/2022)   Hunger Vital Sign    Worried About Running Out of Food in the Last Year: Never true    Ran Out of Food in the Last Year: Never true  Transportation Needs: No Transportation Needs (08/26/2021)   PRAPARE - Administrator, Civil Service (Medical): No    Lack of Transportation (Non-Medical): No  Physical Activity: Inactive (08/26/2021)   Exercise Vital Sign    Days of Exercise per Week: 0 days    Minutes of Exercise per Session: 0 min  Stress: Not on file  Social Connections: Not on file  Intimate Partner Violence: Not At Risk (04/21/2022)   Humiliation, Afraid, Rape, and Kick questionnaire    Fear of Current or Ex-Partner: No    Emotionally Abused: No    Physically Abused: No    Sexually Abused: No    FAMILY HISTORY: Family History  Problem Relation Age of Onset   Thyroid disease Mother    Diabetes Mother    Hypertension Mother    Hyperlipidemia Mother    High blood pressure Father    High Cholesterol Father    Colitis Brother    Goiter Maternal Grandmother    Breast cancer Maternal Aunt     ALLERGIES:  is allergic to shellfish allergy, blue dyes (parenteral), and hydrochlorothiazide.  MEDICATIONS:  Current Outpatient Medications  Medication Sig Dispense Refill   celecoxib (CELEBREX) 100 MG capsule Take 1 capsule (100 mg total) by mouth 2 (two) times daily. 30 capsule 0   acyclovir (ZOVIRAX) 400 MG tablet TAKE 1 TABLET BY MOUTH TWICE A DAY 180 tablet 1   amLODipine (NORVASC) 5 MG tablet TAKE 1 TABLET (5 MG TOTAL) BY MOUTH DAILY. 90 tablet 2   aspirin EC 81 MG tablet Take 81 mg by mouth daily. Swallow whole.     furosemide (LASIX) 20 MG tablet Take 1 tablet (20 mg total) by mouth daily as needed. 90 tablet 0   HYDROcodone bit-homatropine (HYCODAN) 5-1.5 MG/5ML syrup Take 5 mLs by mouth every 8 (eight) hours as needed  for cough. 120 mL 0   hyoscyamine (ANASPAZ) 0.125 MG TBDP disintergrating tablet Place 1 tablet (0.125 mg total) under the tongue every 4 (four) hours as needed for cramping. 30 tablet 0   KLOR-CON M20 20 MEQ tablet TAKE 2 TABLETS BY MOUTH DAILY 180 tablet 1   lenalidomide (REVLIMID) 25 MG capsule Take 1 capsule (25 mg total) by mouth daily. Take for 14 days on, then 7 days off. Repeat every 21 days. Auth #  [id number]  Date obtained 10/02/22 14 capsule  0   lidocaine (LIDODERM) 5 % Place 1 patch onto the skin daily. Remove & Discard patch within 12 hours or as directed by MD 30 patch 1   metoprolol tartrate (LOPRESSOR) 25 MG tablet ONE HALF TAB BY MOUTH UP TO TWICE DAILY IF NEEDED FOR PALPITATIONS 90 tablet 1   nystatin (MYCOSTATIN) 100000 UNIT/ML suspension Take 5 mLs (500,000 Units total) by mouth 4 (four) times daily. 60 mL 0   olopatadine (PATANOL) 0.1 % ophthalmic solution Place 1 drop into both eyes 2 (two) times daily as needed. 5 mL PRN   pantoprazole (PROTONIX) 40 MG tablet TAKE 1 TABLET BY MOUTH EVERY DAY 90 tablet 3   traMADol (ULTRAM) 50 MG tablet Take 1 tablet (50 mg total) by mouth every 8 (eight) hours as needed for moderate pain. 30 tablet 0   triamcinolone cream (KENALOG) 0.1 % Apply 1 Application topically 3 (three) times daily. (Patient taking differently: Apply 1 Application topically 3 (three) times daily. As needed) 30 g 1   No current facility-administered medications for this visit.    REVIEW OF SYSTEMS:   Constitutional: ( - ) fevers, ( - )  chills , ( - ) night sweats Eyes: ( - ) blurriness of vision, ( - ) double vision, ( - ) watery eyes Ears, nose, mouth, throat, and face: ( - ) mucositis, ( - ) sore throat Respiratory: ( - ) cough, ( - ) dyspnea, ( - ) wheezes Cardiovascular: ( - ) palpitation, ( - ) chest discomfort, ( + ) lower extremity swelling Gastrointestinal:  ( - ) nausea, ( - ) heartburn, ( - ) change in bowel habits Skin: ( - ) abnormal skin  rashes Lymphatics: ( - ) new lymphadenopathy, ( - ) easy bruising Neurological: ( - ) numbness, ( - ) tingling, ( - ) new weaknesses Behavioral/Psych: ( - ) mood change, ( - ) new changes  All other systems were reviewed with the patient and are negative.  PHYSICAL EXAMINATION: ECOG PERFORMANCE STATUS: 1 - Symptomatic but completely ambulatory  Vitals:   10/07/22 1004  BP: (!) 152/89  Pulse: 72  Resp: 15  Temp: 98.1 F (36.7 C)  SpO2: 100%     Filed Weights   10/07/22 1004  Weight: 179 lb 11.2 oz (81.5 kg)   GENERAL: Well-appearing middle-aged African-American female, alert, no distress and comfortable SKIN: skin color, texture, turgor are normal, no rashes or significant lesions EYES: conjunctiva are pink and non-injected, sclera clear LUNGS: clear to auscultation and percussion with normal breathing effort HEART: regular rate & rhythm and no murmurs. Mild bilateral lower extremity edema Musculoskeletal: no cyanosis of digits and no clubbing  PSYCH: alert & oriented x 3, fluent speech NEURO: no focal motor/sensory deficits  LABORATORY DATA:  I have reviewed the data as listed    Latest Ref Rng & Units 10/07/2022    9:11 AM 09/30/2022    7:51 AM 09/23/2022   11:09 AM  CBC  WBC 4.0 - 10.5 K/uL 5.0  4.2  5.2   Hemoglobin 12.0 - 15.0 g/dL 16.1  09.6  04.5   Hematocrit 36.0 - 46.0 % 33.7  33.9  32.6   Platelets 150 - 400 K/uL 175  187  188        Latest Ref Rng & Units 10/07/2022    9:11 AM 09/30/2022    7:51 AM 09/23/2022   11:09 AM  CMP  Glucose 70 - 99 mg/dL 409  82  84  BUN 6 - 20 mg/dL 10  10  11    Creatinine 0.44 - 1.00 mg/dL 1.61  0.96  0.45   Sodium 135 - 145 mmol/L 142  143  141   Potassium 3.5 - 5.1 mmol/L 3.6  3.4  3.8   Chloride 98 - 111 mmol/L 109  109  109   CO2 22 - 32 mmol/L 25  26  27    Calcium 8.9 - 10.3 mg/dL 9.0  9.2  9.3   Total Protein 6.5 - 8.1 g/dL 6.1  6.4  6.7   Total Bilirubin 0.3 - 1.2 mg/dL 0.5  0.5  0.4   Alkaline Phos 38 - 126 U/L  70  70  72   AST 15 - 41 U/L 13  11  10    ALT 0 - 44 U/L 17  15  13      Lab Results  Component Value Date   MPROTEIN Not Observed 09/30/2022   MPROTEIN Not Observed 09/09/2022   MPROTEIN Not Observed 08/19/2022   Lab Results  Component Value Date   KPAFRELGTCHN 12.3 09/30/2022   KPAFRELGTCHN 14.1 09/09/2022   KPAFRELGTCHN 14.1 08/19/2022   LAMBDASER 8.6 09/30/2022   LAMBDASER 10.2 09/09/2022   LAMBDASER 13.2 08/19/2022   KAPLAMBRATIO 1.43 09/30/2022   KAPLAMBRATIO 1.38 09/09/2022   KAPLAMBRATIO 1.07 08/19/2022     RADIOGRAPHIC STUDIES: No results found.  ASSESSMENT & PLAN Sonya Franklin is a 57 y.o. female with medical history significant for newly diagnosed multiple myeloma with plasmacytoma who presents for a follow up visit.  # Multiple Myeloma with Plasmactyoma --Confirmed with L5 bone lesion biopsy and bone marrow biopsy. --Started VRD therapy on 05/05/2022.  PLAN: --Due for Cycle 8 Day 8 of VRD therapy today.  Once cycle 8 is complete we will transition to maintenance therapy with maintenance dose Revlimid 10 mg p.o. daily 21 days on and 7 days off. --Labs today reviewed and adequate for treatment.  Labs show white blood cell count 5.0, hemoglobin 10.9, MCV 87.1, and platelets of 175. Creatinine normal.  --Last myeloma labs from 07/29/2022 showed M protein was undetectable with normal serum free light chains.  --Patient completed her consultation with [facility] B[facility] on 08/13/2022.  She notes that she would prefer to pursue maintenance therapy over bone marrow transplant. --Continue with weekly treatment and q 2 week office visits.  After cycle 8 will transition to maintenance therapy.  # Jitteriness/Palpitations: --Likely secondary to steroid therapy versus abnormal thyroid function, symptoms improving with thyroid management.  --continue 20mg  PO dexamethasone.   # Right lower extremity neuropathy: --Greatly improved --Monitor for now.   # Cancer related  pain: --Secondary to focal tumor replacement of the right-side of the L5 vertebral body extending into the right L5 pedicle and posterior elements with a small extraosseous component extending into the right L5 neural foramen with moderate-severe stenosi -- Unable to tolerate opoid medications -- Under the care of pallative care. Medication includes lidocaine pain patch and tylenol with minimal relief. --Patient has agreed to repeat MRI lumbar spine to reassess bone lesions and see if she is a candidate for palliative radiation.   #Supportive Care -- chemotherapy education completed -- port placement not required.  -- zofran 8mg  q8H PRN and compazine 10mg  PO q6H for nausea -- acyclovir 400mg  PO BID for VCZ prophylaxis -- Pain medication as noted above  No orders of the defined types were placed in this encounter.  All questions were answered. The patient knows to call the  clinic with any problems, questions or concerns.  A total of more than 30 minutes were spent on this encounter with face-to-face time and non-face-to-face time, including preparing to see the patient, ordering tests and/or medications, counseling the patient and coordination of care as outlined above.   Ulysees Barns, MD Department of Hematology/Oncology [facility] [employer] Phone: [phone] Pager: [phone] Email: [email or web address]  10/07/2022 4:56 PM

## 2022-10-06 NOTE — Progress Notes (Unsigned)
Palliative Medicine [facility]  [phone] [phone]   Name: Sonya Franklin Date: 10/06/2022 MRN: [id number]  DOB: Sep 25, 1965  Patient Care Team: Margaree Mackintosh, MD as PCP - General (Internal Medicine)    INTERVAL HISTORY: Sonya Franklin is a 57 y.o. 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 Palliative ask to see for symptom management and goals of care.   SOCIAL HISTORY:     reports that she has never smoked. She has never used smokeless tobacco. She reports that she does not drink alcohol and does not use drugs.  ADVANCE DIRECTIVES:  None on file  CODE STATUS: Full code  PAST MEDICAL HISTORY: Past Medical History:  Diagnosis Date   Ankylosing spondylitis (HCC)    Anxiety    Arthritis    Breast cancer (HCC)    right breast   Bursitis of hip, right    Cardiac arrhythmia    Carotid stenosis 08/26/2021   GE reflux    Hiatal hernia    Hypertension    Hyperthyroidism    Ischemic bowel disease (HCC)    Multiple myeloma not having achieved remission (HCC)    Prediabetes 08/26/2021   Snoring 03/21/2014   Vitamin D deficiency     ALLERGIES:  is allergic to shellfish allergy, blue dyes (parenteral), and hydrochlorothiazide.  MEDICATIONS:  Current Outpatient Medications  Medication Sig Dispense Refill   celecoxib (CELEBREX) 100 MG capsule Take 1 capsule (100 mg total) by mouth 2 (two) times daily. 30 capsule 0   acyclovir (ZOVIRAX) 400 MG tablet TAKE 1 TABLET BY MOUTH TWICE A DAY 180 tablet 1   amLODipine (NORVASC) 5 MG tablet TAKE 1 TABLET (5 MG TOTAL) BY MOUTH DAILY. 90 tablet 2   aspirin EC 81 MG tablet Take 81 mg by mouth daily. Swallow whole.     furosemide (LASIX) 20 MG tablet Take 1 tablet (20 mg total) by mouth daily as needed. 90 tablet 0   HYDROcodone bit-homatropine (HYCODAN) 5-1.5 MG/5ML syrup Take 5 mLs by mouth  every 8 (eight) hours as needed for cough. 120 mL 0   hyoscyamine (ANASPAZ) 0.125 MG TBDP disintergrating tablet Place 1 tablet (0.125 mg total) under the tongue every 4 (four) hours as needed for cramping. 30 tablet 0   KLOR-CON M20 20 MEQ tablet TAKE 2 TABLETS BY MOUTH DAILY 180 tablet 1   lenalidomide (REVLIMID) 25 MG capsule Take 1 capsule (25 mg total) by mouth daily. Take for 14 days on, then 7 days off. Repeat every 21 days. Auth #  [id number]  Date obtained 10/02/22 14 capsule 0   lidocaine (LIDODERM) 5 % Place 1 patch onto the skin daily. Remove & Discard patch within 12 hours or as directed by MD 30 patch 1   metoprolol tartrate (LOPRESSOR) 25 MG tablet ONE HALF TAB BY MOUTH UP TO TWICE DAILY IF NEEDED FOR PALPITATIONS 90 tablet 1   nystatin (MYCOSTATIN) 100000 UNIT/ML suspension Take 5 mLs (500,000 Units total) by mouth 4 (four) times daily. 60 mL 0   olopatadine (PATANOL) 0.1 % ophthalmic solution Place 1 drop into both eyes 2 (two) times daily as needed. 5 mL PRN   pantoprazole (PROTONIX) 40 MG tablet TAKE 1 TABLET BY MOUTH EVERY DAY 90 tablet 3   traMADol (ULTRAM) 50 MG tablet Take 1 tablet (50 mg total) by mouth every 8 (eight) hours as needed for moderate pain. 30 tablet  0   triamcinolone cream (KENALOG) 0.1 % Apply 1 Application topically 3 (three) times daily. (Patient taking differently: Apply 1 Application topically 3 (three) times daily. As needed) 30 g 1   No current facility-administered medications for this visit.    VITAL SIGNS: There were no vitals taken for this visit. There were no vitals filed for this visit.  Estimated body mass index is 33.69 kg/m as calculated from the following:   Height as of 08/13/22: 5\' 1"  (1.549 m).   Weight as of 09/23/22: 178 lb 4.8 oz (80.9 kg).   PERFORMANCE STATUS (ECOG) : 1 - Symptomatic but completely ambulatory   Physical Exam General: NAD Cardiovascular: regular rate and rhythm Pulmonary: normal breathing pattern  Extremities:  trace bilateral edema, no joint deformities Skin: no rashes Neurological: AAO x4  IMPRESSION:  [name] presents to clinic for follow-up. No acute distress. Is taking things one day at a time. Denies nausea, vomiting, constipation, or diarrhea.   Pain Patient endorses ongoing arthritic leg and back pain. Some chest discomfort. She is hopeful for some improvement over time. Is using lidocaine patches and topical creams. We discussed use of Celebrex however she did not start out of concern for medication interactions. She has medication on hand in addition to tramadol. She will soon be on maintenance treatment and at this time may consider use of medication.   We discussed use of continued topical treatments for support.    Patient understands we are available as needed.    PLAN:   Extensive discussion regarding nonpharmacological methods to assist with her pain and discomfort.  Education also provided on use of oral medications.   Open to use of medication for pain that is not opioid classification in the near future.  I will plan to see patient back in 6-8 weeks in collaboration to other oncology appointments.    Patient expressed understanding and was in agreement with this plan. She also understands that She can call the clinic at any time with any questions, concerns, or complaints.   Any controlled substances utilized were prescribed in the context of palliative care. PDMP has been reviewed.    Visit consisted of counseling and education dealing with the complex and emotionally intense issues of symptom management and palliative care in the setting of serious and potentially life-threatening illness.Greater than 50%  of this time was spent counseling and coordinating care related to the above assessment and plan.  [name], AGPCNP-BC  Palliative Medicine Team/[facility]  *Please note that this is a verbal dictation therefore any spelling or  grammatical errors are due to the "Dragon Medical One" system interpretation.

## 2022-10-07 ENCOUNTER — Inpatient Hospital Stay: Payer: Medicare Other

## 2022-10-07 ENCOUNTER — Other Ambulatory Visit: Payer: Self-pay

## 2022-10-07 ENCOUNTER — Encounter: Payer: Self-pay | Admitting: Nurse Practitioner

## 2022-10-07 ENCOUNTER — Inpatient Hospital Stay: Payer: Medicare Other | Admitting: Hematology and Oncology

## 2022-10-07 ENCOUNTER — Inpatient Hospital Stay (HOSPITAL_BASED_OUTPATIENT_CLINIC_OR_DEPARTMENT_OTHER): Payer: Medicare Other | Admitting: Nurse Practitioner

## 2022-10-07 VITALS — BP 154/67 | HR 66

## 2022-10-07 VITALS — BP 152/89 | HR 72 | Temp 98.1°F | Resp 15 | Wt 179.7 lb

## 2022-10-07 DIAGNOSIS — M899 Disorder of bone, unspecified: Secondary | ICD-10-CM

## 2022-10-07 DIAGNOSIS — Z79899 Other long term (current) drug therapy: Secondary | ICD-10-CM | POA: Diagnosis not present

## 2022-10-07 DIAGNOSIS — Z515 Encounter for palliative care: Secondary | ICD-10-CM

## 2022-10-07 DIAGNOSIS — E559 Vitamin D deficiency, unspecified: Secondary | ICD-10-CM | POA: Diagnosis not present

## 2022-10-07 DIAGNOSIS — R002 Palpitations: Secondary | ICD-10-CM | POA: Diagnosis not present

## 2022-10-07 DIAGNOSIS — M25552 Pain in left hip: Secondary | ICD-10-CM | POA: Diagnosis not present

## 2022-10-07 DIAGNOSIS — M25551 Pain in right hip: Secondary | ICD-10-CM | POA: Diagnosis not present

## 2022-10-07 DIAGNOSIS — C9000 Multiple myeloma not having achieved remission: Secondary | ICD-10-CM

## 2022-10-07 DIAGNOSIS — K219 Gastro-esophageal reflux disease without esophagitis: Secondary | ICD-10-CM | POA: Diagnosis not present

## 2022-10-07 DIAGNOSIS — Z803 Family history of malignant neoplasm of breast: Secondary | ICD-10-CM | POA: Diagnosis not present

## 2022-10-07 DIAGNOSIS — Z7982 Long term (current) use of aspirin: Secondary | ICD-10-CM | POA: Diagnosis not present

## 2022-10-07 DIAGNOSIS — H538 Other visual disturbances: Secondary | ICD-10-CM | POA: Diagnosis not present

## 2022-10-07 DIAGNOSIS — Z791 Long term (current) use of non-steroidal anti-inflammatories (NSAID): Secondary | ICD-10-CM | POA: Diagnosis not present

## 2022-10-07 DIAGNOSIS — K449 Diaphragmatic hernia without obstruction or gangrene: Secondary | ICD-10-CM | POA: Diagnosis not present

## 2022-10-07 DIAGNOSIS — Z5112 Encounter for antineoplastic immunotherapy: Secondary | ICD-10-CM | POA: Diagnosis not present

## 2022-10-07 DIAGNOSIS — G893 Neoplasm related pain (acute) (chronic): Secondary | ICD-10-CM | POA: Diagnosis not present

## 2022-10-07 DIAGNOSIS — Z7961 Long term (current) use of immunomodulator: Secondary | ICD-10-CM | POA: Diagnosis not present

## 2022-10-07 DIAGNOSIS — E0590 Thyrotoxicosis, unspecified without thyrotoxic crisis or storm: Secondary | ICD-10-CM | POA: Diagnosis not present

## 2022-10-07 DIAGNOSIS — M459 Ankylosing spondylitis of unspecified sites in spine: Secondary | ICD-10-CM | POA: Diagnosis not present

## 2022-10-07 DIAGNOSIS — I10 Essential (primary) hypertension: Secondary | ICD-10-CM | POA: Diagnosis not present

## 2022-10-07 DIAGNOSIS — R530 Neoplastic (malignant) related fatigue: Secondary | ICD-10-CM

## 2022-10-07 LAB — CMP (CANCER CENTER ONLY)
ALT: 17 U/L (ref 0–44)
AST: 13 U/L — ABNORMAL LOW (ref 15–41)
Albumin: 4 g/dL (ref 3.5–5.0)
Alkaline Phosphatase: 70 U/L (ref 38–126)
Anion gap: 8 (ref 5–15)
BUN: 10 mg/dL (ref 6–20)
CO2: 25 mmol/L (ref 22–32)
Calcium: 9 mg/dL (ref 8.9–10.3)
Chloride: 109 mmol/L (ref 98–111)
Creatinine: 0.72 mg/dL (ref 0.44–1.00)
GFR, Estimated: >60 mL/min (ref >60)
Glucose, Bld: 103 mg/dL — ABNORMAL HIGH (ref 70–99)
Potassium: 3.6 mmol/L (ref 3.5–5.1)
Sodium: 142 mmol/L (ref 135–145)
Total Bilirubin: 0.5 mg/dL (ref 0.3–1.2)
Total Protein: 6.1 g/dL — ABNORMAL LOW (ref 6.5–8.1)

## 2022-10-07 LAB — CBC WITH DIFFERENTIAL (CANCER CENTER ONLY)
Abs Immature Granulocytes: 0.03 10*3/uL (ref 0.00–0.07)
Basophils Absolute: 0 10*3/uL (ref 0.0–0.1)
Basophils Relative: 0 %
Eosinophils Absolute: 0.2 10*3/uL (ref 0.0–0.5)
Eosinophils Relative: 4 %
HCT: 33.7 % — ABNORMAL LOW (ref 36.0–46.0)
Hemoglobin: 10.9 g/dL — ABNORMAL LOW (ref 12.0–15.0)
Immature Granulocytes: 1 %
Lymphocytes Relative: 20 %
Lymphs Abs: 1 10*3/uL (ref 0.7–4.0)
MCH: 28.2 pg (ref 26.0–34.0)
MCHC: 32.3 g/dL (ref 30.0–36.0)
MCV: 87.1 fL (ref 80.0–100.0)
Monocytes Absolute: 1.3 10*3/uL — ABNORMAL HIGH (ref 0.1–1.0)
Monocytes Relative: 26 %
Neutro Abs: 2.4 10*3/uL (ref 1.7–7.7)
Neutrophils Relative %: 49 %
Platelet Count: 175 10*3/uL (ref 150–400)
RBC: 3.87 MIL/uL (ref 3.87–5.11)
RDW: 15.9 % — ABNORMAL HIGH (ref 11.5–15.5)
WBC Count: 5 10*3/uL (ref 4.0–10.5)
nRBC: 0 % (ref 0.0–0.2)

## 2022-10-07 MED ORDER — DEXAMETHASONE 4 MG PO TABS
20.0000 mg | ORAL_TABLET | Freq: Once | ORAL | Status: AC
  Administered 2022-10-07: 20 mg via ORAL
  Filled 2022-10-07: qty 5

## 2022-10-07 MED ORDER — SODIUM CHLORIDE 0.9 % IV SOLN: INTRAVENOUS | Status: AC

## 2022-10-07 MED ORDER — BORTEZOMIB CHEMO SQ INJECTION 3.5 MG (2.5MG/ML)
1.3000 mg/m2 | Freq: Once | INTRAMUSCULAR | Status: AC
  Administered 2022-10-07: 2.5 mg via SUBCUTANEOUS
  Filled 2022-10-07: qty 1

## 2022-10-07 NOTE — Patient Instructions (Signed)
[facility]  Discharge Instructions: Thank you for choosing [facility] to provide your oncology and hematology care.   If you have a lab appointment with the Cancer Center, please go directly to the Cancer Center and check in at the registration area.   Wear comfortable clothing and clothing appropriate for easy access to any Portacath or PICC line.   We strive to give you quality time with your provider. You may need to reschedule your appointment if you arrive late (15 or more minutes).  Arriving late affects you and other patients whose appointments are after yours.  Also, if you miss three or more appointments without notifying the office, you may be dismissed from the clinic at the provider's discretion.      For prescription refill requests, have your pharmacy contact our office and allow 72 hours for refills to be completed.    Today you received the following chemotherapy and/or immunotherapy agents: Velcade        To help prevent nausea and vomiting after your treatment, we encourage you to take your nausea medication as directed.  BELOW ARE SYMPTOMS THAT SHOULD BE REPORTED IMMEDIATELY: *FEVER GREATER THAN 100.4 F (38 C) OR HIGHER *CHILLS OR SWEATING *NAUSEA AND VOMITING THAT IS NOT CONTROLLED WITH YOUR NAUSEA MEDICATION *UNUSUAL SHORTNESS OF BREATH *UNUSUAL BRUISING OR BLEEDING *URINARY PROBLEMS (pain or burning when urinating, or frequent urination) *BOWEL PROBLEMS (unusual diarrhea, constipation, pain near the anus) TENDERNESS IN MOUTH AND THROAT WITH OR WITHOUT PRESENCE OF ULCERS (sore throat, sores in mouth, or a toothache) UNUSUAL RASH, SWELLING OR PAIN  UNUSUAL VAGINAL DISCHARGE OR ITCHING   Items with * indicate a potential emergency and should be followed up as soon as possible or go to the Emergency Department if any problems should occur.  Please show the CHEMOTHERAPY ALERT CARD or IMMUNOTHERAPY ALERT CARD at  check-in to the Emergency Department and triage nurse.  Should you have questions after your visit or need to cancel or reschedule your appointment, please contact [facility]  Dept: [phone]  and follow the prompts.  Office hours are 8:00 a.m. to 4:30 p.m. Monday - Friday. Please note that voicemails left after 4:00 p.m. may not be returned until the following business day.  We are closed weekends and major holidays. You have access to a nurse at all times for urgent questions. Please call the main number to the clinic Dept: [phone] and follow the prompts.   For any non-urgent questions, you may also contact your provider using MyChart. We now offer e-Visits for anyone 18 and older to request care online for non-urgent symptoms. For details visit [email or web address].   Also download the MyChart app! Go to the app store, search "MyChart", open the app, select [facility], and log in with your MyChart username and password.  

## 2022-10-14 ENCOUNTER — Inpatient Hospital Stay: Payer: Medicare Other

## 2022-10-14 ENCOUNTER — Other Ambulatory Visit: Payer: Self-pay

## 2022-10-14 VITALS — BP 124/78 | HR 68 | Temp 98.4°F | Resp 18 | Wt 177.2 lb

## 2022-10-14 DIAGNOSIS — Z7961 Long term (current) use of immunomodulator: Secondary | ICD-10-CM | POA: Diagnosis not present

## 2022-10-14 DIAGNOSIS — Z5112 Encounter for antineoplastic immunotherapy: Secondary | ICD-10-CM | POA: Diagnosis not present

## 2022-10-14 DIAGNOSIS — M25551 Pain in right hip: Secondary | ICD-10-CM | POA: Diagnosis not present

## 2022-10-14 DIAGNOSIS — K449 Diaphragmatic hernia without obstruction or gangrene: Secondary | ICD-10-CM | POA: Diagnosis not present

## 2022-10-14 DIAGNOSIS — Z7982 Long term (current) use of aspirin: Secondary | ICD-10-CM | POA: Diagnosis not present

## 2022-10-14 DIAGNOSIS — Z79899 Other long term (current) drug therapy: Secondary | ICD-10-CM | POA: Diagnosis not present

## 2022-10-14 DIAGNOSIS — C9000 Multiple myeloma not having achieved remission: Secondary | ICD-10-CM

## 2022-10-14 DIAGNOSIS — Z791 Long term (current) use of non-steroidal anti-inflammatories (NSAID): Secondary | ICD-10-CM | POA: Diagnosis not present

## 2022-10-14 DIAGNOSIS — K219 Gastro-esophageal reflux disease without esophagitis: Secondary | ICD-10-CM | POA: Diagnosis not present

## 2022-10-14 DIAGNOSIS — H538 Other visual disturbances: Secondary | ICD-10-CM | POA: Diagnosis not present

## 2022-10-14 DIAGNOSIS — I10 Essential (primary) hypertension: Secondary | ICD-10-CM | POA: Diagnosis not present

## 2022-10-14 DIAGNOSIS — Z803 Family history of malignant neoplasm of breast: Secondary | ICD-10-CM | POA: Diagnosis not present

## 2022-10-14 DIAGNOSIS — E559 Vitamin D deficiency, unspecified: Secondary | ICD-10-CM | POA: Diagnosis not present

## 2022-10-14 DIAGNOSIS — M25552 Pain in left hip: Secondary | ICD-10-CM | POA: Diagnosis not present

## 2022-10-14 DIAGNOSIS — M459 Ankylosing spondylitis of unspecified sites in spine: Secondary | ICD-10-CM | POA: Diagnosis not present

## 2022-10-14 DIAGNOSIS — G893 Neoplasm related pain (acute) (chronic): Secondary | ICD-10-CM | POA: Diagnosis not present

## 2022-10-14 DIAGNOSIS — E0590 Thyrotoxicosis, unspecified without thyrotoxic crisis or storm: Secondary | ICD-10-CM | POA: Diagnosis not present

## 2022-10-14 DIAGNOSIS — R002 Palpitations: Secondary | ICD-10-CM | POA: Diagnosis not present

## 2022-10-14 LAB — CMP (CANCER CENTER ONLY)
ALT: 15 U/L (ref 0–44)
AST: 10 U/L — ABNORMAL LOW (ref 15–41)
Albumin: 3.9 g/dL (ref 3.5–5.0)
Alkaline Phosphatase: 62 U/L (ref 38–126)
Anion gap: 8 (ref 5–15)
BUN: 13 mg/dL (ref 6–20)
CO2: 25 mmol/L (ref 22–32)
Calcium: 9.1 mg/dL (ref 8.9–10.3)
Chloride: 108 mmol/L (ref 98–111)
Creatinine: 0.69 mg/dL (ref 0.44–1.00)
GFR, Estimated: >60 mL/min (ref >60)
Glucose, Bld: 100 mg/dL — ABNORMAL HIGH (ref 70–99)
Potassium: 3.4 mmol/L — ABNORMAL LOW (ref 3.5–5.1)
Sodium: 141 mmol/L (ref 135–145)
Total Bilirubin: 0.5 mg/dL (ref 0.3–1.2)
Total Protein: 6.2 g/dL — ABNORMAL LOW (ref 6.5–8.1)

## 2022-10-14 LAB — CBC WITH DIFFERENTIAL (CANCER CENTER ONLY)
Abs Immature Granulocytes: 0.04 10*3/uL (ref 0.00–0.07)
Basophils Absolute: 0.1 10*3/uL (ref 0.0–0.1)
Basophils Relative: 1 %
Eosinophils Absolute: 0.3 10*3/uL (ref 0.0–0.5)
Eosinophils Relative: 5 %
HCT: 33.2 % — ABNORMAL LOW (ref 36.0–46.0)
Hemoglobin: 10.7 g/dL — ABNORMAL LOW (ref 12.0–15.0)
Immature Granulocytes: 1 %
Lymphocytes Relative: 19 %
Lymphs Abs: 1.1 10*3/uL (ref 0.7–4.0)
MCH: 28 pg (ref 26.0–34.0)
MCHC: 32.2 g/dL (ref 30.0–36.0)
MCV: 86.9 fL (ref 80.0–100.0)
Monocytes Absolute: 1.5 10*3/uL — ABNORMAL HIGH (ref 0.1–1.0)
Monocytes Relative: 26 %
Neutro Abs: 2.7 10*3/uL (ref 1.7–7.7)
Neutrophils Relative %: 48 %
Platelet Count: 203 10*3/uL (ref 150–400)
RBC: 3.82 MIL/uL — ABNORMAL LOW (ref 3.87–5.11)
RDW: 15.9 % — ABNORMAL HIGH (ref 11.5–15.5)
WBC Count: 5.7 10*3/uL (ref 4.0–10.5)
nRBC: 0 % (ref 0.0–0.2)

## 2022-10-14 MED ORDER — BORTEZOMIB CHEMO SQ INJECTION 3.5 MG (2.5MG/ML)
1.3000 mg/m2 | Freq: Once | INTRAMUSCULAR | Status: AC
  Administered 2022-10-14: 2.5 mg via SUBCUTANEOUS
  Filled 2022-10-14: qty 1

## 2022-10-14 MED ORDER — DEXAMETHASONE 4 MG PO TABS
20.0000 mg | ORAL_TABLET | Freq: Once | ORAL | Status: AC
  Administered 2022-10-14: 20 mg via ORAL
  Filled 2022-10-14: qty 5

## 2022-10-14 MED ORDER — SODIUM CHLORIDE 0.9 % IV SOLN: INTRAVENOUS | Status: AC

## 2022-10-14 NOTE — Patient Instructions (Signed)
[facility]  Discharge Instructions: Thank you for choosing [facility] to provide your oncology and hematology care.   If you have a lab appointment with the Cancer Center, please go directly to the Cancer Center and check in at the registration area.   Wear comfortable clothing and clothing appropriate for easy access to any Portacath or PICC line.   We strive to give you quality time with your provider. You may need to reschedule your appointment if you arrive late (15 or more minutes).  Arriving late affects you and other patients whose appointments are after yours.  Also, if you miss three or more appointments without notifying the office, you may be dismissed from the clinic at the provider's discretion.      For prescription refill requests, have your pharmacy contact our office and allow 72 hours for refills to be completed.    Today you received the following chemotherapy and/or immunotherapy agents: Velcade        To help prevent nausea and vomiting after your treatment, we encourage you to take your nausea medication as directed.  BELOW ARE SYMPTOMS THAT SHOULD BE REPORTED IMMEDIATELY: *FEVER GREATER THAN 100.4 F (38 C) OR HIGHER *CHILLS OR SWEATING *NAUSEA AND VOMITING THAT IS NOT CONTROLLED WITH YOUR NAUSEA MEDICATION *UNUSUAL SHORTNESS OF BREATH *UNUSUAL BRUISING OR BLEEDING *URINARY PROBLEMS (pain or burning when urinating, or frequent urination) *BOWEL PROBLEMS (unusual diarrhea, constipation, pain near the anus) TENDERNESS IN MOUTH AND THROAT WITH OR WITHOUT PRESENCE OF ULCERS (sore throat, sores in mouth, or a toothache) UNUSUAL RASH, SWELLING OR PAIN  UNUSUAL VAGINAL DISCHARGE OR ITCHING   Items with * indicate a potential emergency and should be followed up as soon as possible or go to the Emergency Department if any problems should occur.  Please show the CHEMOTHERAPY ALERT CARD or IMMUNOTHERAPY ALERT CARD at  check-in to the Emergency Department and triage nurse.  Should you have questions after your visit or need to cancel or reschedule your appointment, please contact [facility]  Dept: [phone]  and follow the prompts.  Office hours are 8:00 a.m. to 4:30 p.m. Monday - Friday. Please note that voicemails left after 4:00 p.m. may not be returned until the following business day.  We are closed weekends and major holidays. You have access to a nurse at all times for urgent questions. Please call the main number to the clinic Dept: [phone] and follow the prompts.   For any non-urgent questions, you may also contact your provider using MyChart. We now offer e-Visits for anyone 18 and older to request care online for non-urgent symptoms. For details visit [email or web address].   Also download the MyChart app! Go to the app store, search "MyChart", open the app, select [facility], and log in with your MyChart username and password.  

## 2022-10-15 ENCOUNTER — Encounter (HOSPITAL_COMMUNITY): Payer: Medicare Other

## 2022-10-15 ENCOUNTER — Telehealth: Payer: Self-pay | Admitting: Internal Medicine

## 2022-10-15 ENCOUNTER — Encounter (HOSPITAL_COMMUNITY): Admission: RE | Admit: 2022-10-15 | Payer: Medicare Other | Source: Ambulatory Visit

## 2022-10-15 ENCOUNTER — Telehealth: Payer: Self-pay | Admitting: Endocrinology

## 2022-10-15 NOTE — Telephone Encounter (Signed)
Dr Lucianne Muss Ms Harp went to do her [place] Thyroid Uptake and scan and they told her that the prior Authorization had ran out and they couldn't do it , she is asking can she just get a refill on her thyroid medications at this time, Please advise?

## 2022-10-15 NOTE — Telephone Encounter (Signed)
Patient advising that she was supposed to have a procedure done today at [facility] and her orders were expired. Please call patient .Marland [phone]

## 2022-10-15 NOTE — Telephone Encounter (Signed)
scheduled

## 2022-10-15 NOTE — Telephone Encounter (Signed)
Sonya Franklin [phone]  [name] called to say she would like to come in and talk to you about her TSH and Brain stuff and whole lot of stuff that is going on, you always have a way of calming her down and telling her like it is.

## 2022-10-16 ENCOUNTER — Other Ambulatory Visit: Payer: Self-pay | Admitting: Endocrinology

## 2022-10-16 ENCOUNTER — Encounter (HOSPITAL_COMMUNITY): Payer: Medicare Other

## 2022-10-16 DIAGNOSIS — E0590 Thyrotoxicosis, unspecified without thyrotoxic crisis or storm: Secondary | ICD-10-CM

## 2022-10-19 ENCOUNTER — Encounter: Payer: Self-pay | Admitting: Internal Medicine

## 2022-10-19 ENCOUNTER — Ambulatory Visit (INDEPENDENT_AMBULATORY_CARE_PROVIDER_SITE_OTHER): Payer: Medicare Other | Admitting: Internal Medicine

## 2022-10-19 VITALS — BP 118/78 | HR 69 | Resp 16 | Ht 61.0 in | Wt 177.5 lb

## 2022-10-19 DIAGNOSIS — I10 Essential (primary) hypertension: Secondary | ICD-10-CM | POA: Diagnosis not present

## 2022-10-19 DIAGNOSIS — C9000 Multiple myeloma not having achieved remission: Secondary | ICD-10-CM

## 2022-10-19 DIAGNOSIS — R7989 Other specified abnormal findings of blood chemistry: Secondary | ICD-10-CM | POA: Diagnosis not present

## 2022-10-19 DIAGNOSIS — Z853 Personal history of malignant neoplasm of breast: Secondary | ICD-10-CM

## 2022-10-19 DIAGNOSIS — Z8639 Personal history of other endocrine, nutritional and metabolic disease: Secondary | ICD-10-CM | POA: Diagnosis not present

## 2022-10-19 DIAGNOSIS — J0100 Acute maxillary sinusitis, unspecified: Secondary | ICD-10-CM

## 2022-10-19 DIAGNOSIS — F411 Generalized anxiety disorder: Secondary | ICD-10-CM

## 2022-10-19 DIAGNOSIS — R7302 Impaired glucose tolerance (oral): Secondary | ICD-10-CM | POA: Diagnosis not present

## 2022-10-19 MED ORDER — ALPRAZOLAM 0.5 MG PO TABS: 0.5000 mg | ORAL_TABLET | Freq: Three times a day (TID) | ORAL | 2 refills | Status: AC | PRN

## 2022-10-19 NOTE — Progress Notes (Signed)
   Subjective:    Patient ID: Sonya Franklin, female    DOB: 02/24/1966, 57 y.o.   MRN: [id number]  HPI Patient has been diagnosed with multiple myeloma and is being followed by Dr. Leonides Schanz.  She also has impaired glucose tolerance and is seen by [facility].  In April she was found to have a multinodular goiter on ultrasound.  She did have a biopsy in November 2006 of a right inferior thyroid nodule which she has now become involuted.  There is a 1.2 cm left inferior thyroid nodule present.  Recent TSH values have been low in February March and May.  In May her free T4 was 1.07.  Dr. Lucianne Muss is concerned that she has hyperthyroidism and a nuclear medicine study has been ordered for the near future.  I concur with this.  She was reassured today that this is a necessary evaluation.  In January 2024 CT biopsy and bone marrow biopsy confirmed presence of plasmacytoma and bone marrow involvement with multiple myeloma.  She is receiving VRD therapy per Dr. Leonides Schanz.  A bone marrow transplant has been mention to her.  She feels that she may not want to pursue that but I feel that she should at least go speak with staff at [facility] about this option.  I talked with her about this today.  She is having some issues with dependent edema.  This may be due to amlodipine which she takes for hypertension in addition to hot weather.  We did prescribe Lasix 20 mg daily for her in June.  I am not sure that she is taking it regularly but probably should do that.  She knows to avoid sodium if at all possible in the diet.  Review of Systems     Objective:   Physical Exam  Her blood pressure is excellent 118/78 pulse 69 respiratory rate 16 pulse oximetry 99% weight 174 pounds 8 ounces BMI 33.54 her chest is clear.  Neck supple.  No carotid bruits.  Cardiac exam regular rate and rhythm.  She does have a symmetrically enlarged thyroid gland      Assessment & Plan:  Patient has multinodular  goiter and is undergoing further evaluation per Dr. Lucianne Muss with a nuclear medicine study  Dependent edema-she is on Norvasc and Lasix.  Her blood pressure is excellent at the present time.  She should watch salt intake and keep her feet elevated.  Multiple myeloma being treated by Dr. Leonides Schanz with chemotherapy.  A bone marrow transplant has been mentioned to her and I would encourage her to speak with staff from [facility] about this option.  Chronic musculoskeletal pain-longstanding  History of breast cancer  Essential hypertension-stable  Anxiety

## 2022-10-19 NOTE — Telephone Encounter (Signed)
Patient aware and has been schedule for uptake.

## 2022-10-19 NOTE — Progress Notes (Signed)
Patient Care Team: Margaree Mackintosh, MD as PCP - General (Internal Medicine)  Visit Date: 10/19/22  Subjective:    Patient ID: Sonya Franklin , Female   DOB: Jul 29, 1965, 57 y.o.    MRN: [id number]   57 y.o. Female presents today due to increased concerns regarding health. Notes eye redness and palpitations and is concerned it is related to thyroid function or possibly allergies. TSH at 0.04 on 08/13/22. She is using lidocaine patches and topical creams for back pain. History of multiple myeloma with plasmacytoma confirmed with L5 bone lesion biopsy and bone marrow biopsy on 04/15/22. She is going in for weekly treatments and two-week office visits and will transition to maintenance therapy after cycle 8. She is on Revlimid. Followed by oncologist, Dr. Jeanie Sewer.  History of mild impaired glucose tolerance. HGBA1c not checked since fall 2023. She would like to check her A1c today.  Past Medical History:  Diagnosis Date   Ankylosing spondylitis (HCC)    Anxiety    Arthritis    Breast cancer (HCC)    right breast   Bursitis of hip, right    Cardiac arrhythmia    Carotid stenosis 08/26/2021   GE reflux    Hiatal hernia    Hypertension    Hyperthyroidism    Ischemic bowel disease (HCC)    Multiple myeloma not having achieved remission (HCC)    Prediabetes 08/26/2021   Snoring 03/21/2014   Vitamin D deficiency      Family History  Problem Relation Age of Onset   Thyroid disease Mother    Diabetes Mother    Hypertension Mother    Hyperlipidemia Mother    High blood pressure Father    High Cholesterol Father    Colitis Brother    Goiter Maternal Grandmother    Breast cancer Maternal Aunt     Social History   Social History Narrative   Caffeine occasional drinker.   Not working/disabled.  12th grader. Divorced, one kids.       Social history: She receives disability benefits for history of breast cancer 2007 and ankylosing spondylitis.  She resides with special needs son.   She is divorced.  Non-smoker.  No alcohol consumption.       Family history: Father with history of prostate cancer.  Patient's mother was diagnosed with breast cancer at age 42.  Maternal aunt with history of breast cancer.  3 brothers.   Right handed   Caffeine occas   One level stays on this level          Review of Systems  Constitutional:  Negative for fever and malaise/fatigue.  HENT:  Negative for congestion.   Eyes:  Positive for redness. Negative for blurred vision.  Respiratory:  Negative for cough and shortness of breath.   Cardiovascular:  Positive for palpitations. Negative for chest pain and leg swelling.  Gastrointestinal:  Negative for vomiting.  Musculoskeletal:  Positive for back pain.  Skin:  Negative for rash.  Neurological:  Negative for loss of consciousness and headaches.        Objective:   Vitals: BP 118/78   Pulse 69   Resp 16   Ht 5\' 1"  (1.549 m)   Wt 177 lb 8 oz (80.5 kg)   SpO2 99%   BMI 33.54 kg/m    Physical Exam Vitals and nursing note reviewed.  Constitutional:      General: She is not in acute distress.    Appearance: Normal appearance.  She is not toxic-appearing.  HENT:     Head: Normocephalic and atraumatic.  Eyes:     Comments: Sclera slightly injected bilaterally. Eyelid lag bilaterally.  Pulmonary:     Effort: Pulmonary effort is normal.  Skin:    General: Skin is warm and dry.  Neurological:     Mental Status: She is alert and oriented to person, place, and time. Mental status is at baseline.  Psychiatric:        Mood and Affect: Mood normal.        Behavior: Behavior normal.        Thought Content: Thought content normal.        Judgment: Judgment normal.       Results:   Studies obtained and personally reviewed by me:   Labs:       Component Value Date/Time   NA 141 10/14/2022 0754   K 3.4 (L) 10/14/2022 0754   CL 108 10/14/2022 0754   CO2 25 10/14/2022 0754   GLUCOSE 100 (H) 10/14/2022 0754   BUN 13  10/14/2022 0754   CREATININE 0.69 10/14/2022 0754   CREATININE 0.63 10/30/2021 1238   CALCIUM 9.1 10/14/2022 0754   PROT 6.2 (L) 10/14/2022 0754   ALBUMIN 3.9 10/14/2022 0754   AST 10 (L) 10/14/2022 0754   ALT 15 10/14/2022 0754   ALKPHOS 62 10/14/2022 0754   BILITOT 0.5 10/14/2022 0754   GFRNONAA >60 10/14/2022 0754   GFRNONAA 108 04/05/2020 1128   GFRAA 126 04/05/2020 1128     Lab Results  Component Value Date   WBC 5.7 10/14/2022   HGB 10.7 (L) 10/14/2022   HCT 33.2 (L) 10/14/2022   MCV 86.9 10/14/2022   PLT 203 10/14/2022    Lab Results  Component Value Date   CHOL 181 07/17/2021   HDL 64 07/17/2021   LDLCALC 100 (H) 07/17/2021   TRIG 76 07/17/2021   CHOLHDL 2.8 07/17/2021    Lab Results  Component Value Date   HGBA1C 6.2 01/08/2022     Lab Results  Component Value Date   TSH 0.04 (L) 08/13/2022      Assessment & Plan:   Anxiety: prescribed alprazolam 0.5 mg three times daily as needed.  Mild impaired glucose tolerance: HGBA1c at 6.2% 9 months ago. Ordered A1c.    I,Alexander Ruley,acting as a Neurosurgeon for Margaree Mackintosh, MD.,have documented all relevant documentation on the behalf of Margaree Mackintosh, MD,as directed by  Margaree Mackintosh, MD while in the presence of Margaree Mackintosh, MD.   I, Margaree Mackintosh, MD, have reviewed all documentation for this visit. The documentation on 11/01/22 for the exam, diagnosis, procedures, and orders are all accurate and complete.

## 2022-10-22 DIAGNOSIS — G4733 Obstructive sleep apnea (adult) (pediatric): Secondary | ICD-10-CM | POA: Diagnosis not present

## 2022-10-23 ENCOUNTER — Other Ambulatory Visit: Payer: Self-pay | Admitting: *Deleted

## 2022-10-23 MED ORDER — LENALIDOMIDE 10 MG PO CAPS: 10.0000 mg | ORAL_CAPSULE | Freq: Every day | ORAL | 0 refills | Status: DC

## 2022-10-27 ENCOUNTER — Telehealth: Payer: Self-pay | Admitting: Internal Medicine

## 2022-10-27 ENCOUNTER — Encounter (HOSPITAL_COMMUNITY)
Admission: RE | Admit: 2022-10-27 | Discharge: 2022-10-27 | Disposition: A | Discharge status: Home / Self Care | Payer: Medicare Other | Source: Ambulatory Visit | Attending: Endocrinology | Admitting: Endocrinology

## 2022-10-27 DIAGNOSIS — E0590 Thyrotoxicosis, unspecified without thyrotoxic crisis or storm: Secondary | ICD-10-CM | POA: Diagnosis not present

## 2022-10-27 NOTE — Telephone Encounter (Signed)
Sonya Franklin [phone]  [name] called to say that both of her feet and ankles are swollen, what should she do. She is however at the hospital today having a procedure with her thyroid.

## 2022-10-27 NOTE — Telephone Encounter (Signed)
LVM for Cheney to CB and schedule an appointment

## 2022-10-28 ENCOUNTER — Encounter (HOSPITAL_COMMUNITY): Admission: RE | Admit: 2022-10-28 | Payer: Medicare Other | Source: Ambulatory Visit

## 2022-10-29 NOTE — Telephone Encounter (Signed)
scheduled

## 2022-10-30 ENCOUNTER — Encounter: Payer: Self-pay | Admitting: Internal Medicine

## 2022-10-30 ENCOUNTER — Ambulatory Visit (INDEPENDENT_AMBULATORY_CARE_PROVIDER_SITE_OTHER): Payer: Medicare Other | Admitting: Internal Medicine

## 2022-10-30 VITALS — BP 120/90 | HR 74 | Temp 98.0°F | Ht 61.0 in | Wt 178.0 lb

## 2022-10-30 DIAGNOSIS — E0590 Thyrotoxicosis, unspecified without thyrotoxic crisis or storm: Secondary | ICD-10-CM | POA: Diagnosis not present

## 2022-10-30 DIAGNOSIS — E041 Nontoxic single thyroid nodule: Secondary | ICD-10-CM | POA: Diagnosis not present

## 2022-10-30 DIAGNOSIS — R609 Edema, unspecified: Secondary | ICD-10-CM | POA: Diagnosis not present

## 2022-10-30 MED ORDER — SODIUM IODIDE I-123 7.4 MBQ CAPS: 401.0000 | ORAL_CAPSULE | Freq: Once | ORAL | Status: DC

## 2022-10-30 NOTE — Progress Notes (Signed)
Patient Care Team: Margaree Mackintosh, MD as PCP - General (Internal Medicine)  Visit Date: 10/30/22  Subjective:    Patient ID: Sonya Franklin , Female   DOB: 1965-07-05, 57 y.o.    MRN: [id number]   57 y.o. Female presents today for lower extremity swelling. She has been having bilateral lower extremity swelling and a hot feeling in her feet recently. She is taking Lasix 20 mg daily. Potassium was low at 3.4 on 10/14/22. She is taking Klor-Con 40 meq daily. Diagnosed with multiple myeloma with plasmacytoma on 04/2022 as well as ankylosing spondylitis. Taking lenalidomide.  Past Medical History:  Diagnosis Date   Ankylosing spondylitis (HCC)    Anxiety    Arthritis    Breast cancer (HCC)    right breast   Bursitis of hip, right    Cardiac arrhythmia    Carotid stenosis 08/26/2021   GE reflux    Hiatal hernia    Hypertension    Hyperthyroidism    Ischemic bowel disease (HCC)    Multiple myeloma not having achieved remission (HCC)    Prediabetes 08/26/2021   Snoring 03/21/2014   Vitamin D deficiency      Family History  Problem Relation Age of Onset   Thyroid disease Mother    Diabetes Mother    Hypertension Mother    Hyperlipidemia Mother    High blood pressure Father    High Cholesterol Father    Colitis Brother    Goiter Maternal Grandmother    Breast cancer Maternal Aunt     Social History   Social History Narrative   Caffeine occasional drinker.   Not working/disabled.  12th grader. Divorced, one kids.       Social history: She receives disability benefits for history of breast cancer 2007 and ankylosing spondylitis.  She resides with special needs son.  She is divorced.  Non-smoker.  No alcohol consumption.       Family history: Father with history of prostate cancer.  Patient's mother was diagnosed with breast cancer at age 32.  Maternal aunt with history of breast cancer.  3 brothers.   Right handed   Caffeine occas   One level stays on this level           Review of Systems  Constitutional:  Negative for fever and malaise/fatigue.  HENT:  Negative for congestion.   Eyes:  Negative for blurred vision.  Respiratory:  Negative for cough and shortness of breath.   Cardiovascular:  Positive for leg swelling (Bilateral). Negative for chest pain and palpitations.  Gastrointestinal:  Negative for vomiting.  Musculoskeletal:  Negative for back pain.  Skin:  Negative for rash.  Neurological:  Negative for loss of consciousness and headaches.        Objective:   Vitals: BP (!) 120/90   Pulse 74   Temp 98 F (36.7 C)   Ht 5\' 1"  (1.549 m)   Wt 178 lb (80.7 kg)   SpO2 95%   BMI 33.63 kg/m    Physical Exam Vitals and nursing note reviewed.  Constitutional:      General: She is not in acute distress.    Appearance: Normal appearance. She is not toxic-appearing.  HENT:     Head: Normocephalic and atraumatic.  Cardiovascular:     Comments: Homan's sign negative bilaterally. Pulmonary:     Effort: Pulmonary effort is normal.  Musculoskeletal:     Right lower leg: 2+ Pitting Edema present.  Left lower leg: 2+ Pitting Edema present.     Comments: Erythema of anterior lower legs.  Skin:    General: Skin is warm and dry.  Neurological:     Mental Status: She is alert and oriented to person, place, and time. Mental status is at baseline.  Psychiatric:        Mood and Affect: Mood normal.        Behavior: Behavior normal.        Thought Content: Thought content normal.        Judgment: Judgment normal.       Results:   Studies obtained and personally reviewed by me:   Labs:       Component Value Date/Time   NA 141 10/14/2022 0754   K 3.4 (L) 10/14/2022 0754   CL 108 10/14/2022 0754   CO2 25 10/14/2022 0754   GLUCOSE 100 (H) 10/14/2022 0754   BUN 13 10/14/2022 0754   CREATININE 0.69 10/14/2022 0754   CREATININE 0.63 10/30/2021 1238   CALCIUM 9.1 10/14/2022 0754   PROT 6.2 (L) 10/14/2022 0754   ALBUMIN 3.9  10/14/2022 0754   AST 10 (L) 10/14/2022 0754   ALT 15 10/14/2022 0754   ALKPHOS 62 10/14/2022 0754   BILITOT 0.5 10/14/2022 0754   GFRNONAA >60 10/14/2022 0754   GFRNONAA 108 04/05/2020 1128   GFRAA 126 04/05/2020 1128     Lab Results  Component Value Date   WBC 5.7 10/14/2022   HGB 10.7 (L) 10/14/2022   HCT 33.2 (L) 10/14/2022   MCV 86.9 10/14/2022   PLT 203 10/14/2022    Lab Results  Component Value Date   CHOL 181 07/17/2021   HDL 64 07/17/2021   LDLCALC 100 (H) 07/17/2021   TRIG 76 07/17/2021   CHOLHDL 2.8 07/17/2021    Lab Results  Component Value Date   HGBA1C 5.8 (H) 10/19/2022     Lab Results  Component Value Date   TSH 0.04 (L) 08/13/2022      Assessment & Plan:   Dependent edema: increase Lasix to 40 mg daily. Ordered CBC with Diff/Plat, CMET. She has an appointment with her oncology PA Georga Kaufmann on 11/03/22 and was advised to mention her low potassium level.    I,Alexander Ruley,acting as a Neurosurgeon for Margaree Mackintosh, MD.,have documented all relevant documentation on the behalf of Margaree Mackintosh, MD,as directed by  Margaree Mackintosh, MD while in the presence of Margaree Mackintosh, MD.   I, Margaree Mackintosh, MD, have reviewed all documentation for this visit. The documentation on 11/13/22 for the exam, diagnosis, procedures, and orders are all accurate and complete.

## 2022-10-31 ENCOUNTER — Encounter: Payer: Self-pay | Admitting: Hematology and Oncology

## 2022-10-31 LAB — CBC WITH DIFFERENTIAL/PLATELET
Absolute Monocytes: 1037 {cells}/uL — ABNORMAL HIGH (ref 200–950)
Basophils Absolute: 79 {cells}/uL (ref 0–200)
Basophils Relative: 1.3 %
Eosinophils Absolute: 256 {cells}/uL (ref 15–500)
Eosinophils Relative: 4.2 %
HCT: 32.2 % — ABNORMAL LOW (ref 35.0–45.0)
Hemoglobin: 10.5 g/dL — ABNORMAL LOW (ref 11.7–15.5)
Lymphs Abs: 1360 {cells}/uL (ref 850–3900)
MCH: 28.2 pg (ref 27.0–33.0)
MCHC: 32.6 g/dL (ref 32.0–36.0)
MCV: 86.3 fL (ref 80.0–100.0)
MPV: 9.3 fL (ref 7.5–12.5)
Monocytes Relative: 17 %
Neutro Abs: 3367 {cells}/uL (ref 1500–7800)
Neutrophils Relative %: 55.2 %
Platelets: 249 10*3/uL (ref 140–400)
RBC: 3.73 10*6/uL — ABNORMAL LOW (ref 3.80–5.10)
RDW: 15.6 % — ABNORMAL HIGH (ref 11.0–15.0)
Total Lymphocyte: 22.3 %
WBC: 6.1 10*3/uL (ref 3.8–10.8)

## 2022-10-31 LAB — COMPLETE METABOLIC PANEL WITH GFR
AG Ratio: 1.7 (calc) (ref 1.0–2.5)
ALT: 10 U/L (ref 6–29)
AST: 10 U/L (ref 10–35)
Albumin: 4.1 g/dL (ref 3.6–5.1)
Alkaline phosphatase (APISO): 71 U/L (ref 37–153)
BUN: 9 mg/dL (ref 7–25)
CO2: 24 mmol/L (ref 20–32)
Calcium: 9.3 mg/dL (ref 8.6–10.4)
Chloride: 109 mmol/L (ref 98–110)
Creat: 0.75 mg/dL (ref 0.50–1.03)
Globulin: 2.4 g/dL (ref 1.9–3.7)
Glucose, Bld: 92 mg/dL (ref 65–99)
Potassium: 4 mmol/L (ref 3.5–5.3)
Sodium: 142 mmol/L (ref 135–146)
Total Bilirubin: 0.4 mg/dL (ref 0.2–1.2)
Total Protein: 6.5 g/dL (ref 6.1–8.1)
eGFR: 93 mL/min/{1.73_m2} (ref >=60)

## 2022-11-01 NOTE — Patient Instructions (Addendum)
Encourage patient to proceed with the evaluation of multinodular goiter that is likely overactive with nuclear medicine study as recommended by Dr. Lucianne Muss.  Blood pressure control is excellent at the present time on Norvasc and Lasix.  Dependent edema stable.  May be aggravated by hot weather.  Encourage patient to consider speaking with staff at [facility] about bone marrow transplant in the future.  Has Xanax if needed for anxiety.

## 2022-11-02 ENCOUNTER — Other Ambulatory Visit: Payer: Self-pay | Admitting: Physician Assistant

## 2022-11-02 ENCOUNTER — Encounter: Payer: Self-pay | Admitting: Hematology and Oncology

## 2022-11-02 ENCOUNTER — Telehealth: Payer: Self-pay | Admitting: Endocrinology

## 2022-11-02 DIAGNOSIS — C9000 Multiple myeloma not having achieved remission: Secondary | ICD-10-CM

## 2022-11-02 NOTE — Telephone Encounter (Signed)
Patient asking to speak to clinical staff member due to wanting to get her lab results. Please advise

## 2022-11-02 NOTE — Progress Notes (Deleted)
Palliative Medicine [facility]  [phone] [phone]   Name: Sonya Franklin Date: 11/02/2022 MRN: [id number]  DOB: 11-16-1965  Patient Care Team: Margaree Mackintosh, MD as PCP - General (Internal Medicine)    INTERVAL HISTORY: Sonya Franklin is a 57 y.o. 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 Palliative ask to see for symptom management and goals of care.   SOCIAL HISTORY:     reports that she has never smoked. She has never used smokeless tobacco. She reports that she does not drink alcohol and does not use drugs.  ADVANCE DIRECTIVES:  None on file  CODE STATUS: Full code  PAST MEDICAL HISTORY: Past Medical History:  Diagnosis Date   Ankylosing spondylitis (HCC)    Anxiety    Arthritis    Breast cancer (HCC)    right breast   Bursitis of hip, right    Cardiac arrhythmia    Carotid stenosis 08/26/2021   GE reflux    Hiatal hernia    Hypertension    Hyperthyroidism    Ischemic bowel disease (HCC)    Multiple myeloma not having achieved remission (HCC)    Prediabetes 08/26/2021   Snoring 03/21/2014   Vitamin D deficiency     ALLERGIES:  is allergic to shellfish allergy, blue dyes (parenteral), and hydrochlorothiazide.  MEDICATIONS:  Current Outpatient Medications  Medication Sig Dispense Refill   acyclovir (ZOVIRAX) 400 MG tablet TAKE 1 TABLET BY MOUTH TWICE A DAY 180 tablet 1   ALPRAZolam (XANAX) 0.5 MG tablet Take 1 tablet (0.5 mg total) by mouth 3 (three) times daily as needed for anxiety. 60 tablet 2   amLODipine (NORVASC) 5 MG tablet TAKE 1 TABLET (5 MG TOTAL) BY MOUTH DAILY. 90 tablet 2   aspirin EC 81 MG tablet Take 81 mg by mouth daily. Swallow whole.     celecoxib (CELEBREX) 100 MG capsule Take 1 capsule (100 mg total) by mouth 2 (two) times daily. 30 capsule 0   furosemide (LASIX) 20 MG tablet Take 1 tablet (20  mg total) by mouth daily as needed. 90 tablet 0   hyoscyamine (ANASPAZ) 0.125 MG TBDP disintergrating tablet Place 1 tablet (0.125 mg total) under the tongue every 4 (four) hours as needed for cramping. 30 tablet 0   KLOR-CON M20 20 MEQ tablet TAKE 2 TABLETS BY MOUTH DAILY 180 tablet 1   lenalidomide (REVLIMID) 10 MG capsule Take 1 capsule (10 mg total) by mouth daily. [employer] Auth #  [id number]     Date Obtained 10/23/22  Take 1 capsule daily for 21 days then none for 7 days 21 capsule 0   lidocaine (LIDODERM) 5 % Place 1 patch onto the skin daily. Remove & Discard patch within 12 hours or as directed by MD 30 patch 1   metoprolol tartrate (LOPRESSOR) 25 MG tablet ONE HALF TAB BY MOUTH UP TO TWICE DAILY IF NEEDED FOR PALPITATIONS 90 tablet 1   nystatin (MYCOSTATIN) 100000 UNIT/ML suspension Take 5 mLs (500,000 Units total) by mouth 4 (four) times daily. 60 mL 0   olopatadine (PATANOL) 0.1 % ophthalmic solution Place 1 drop into both eyes 2 (two) times daily as needed. 5 mL PRN   pantoprazole (PROTONIX) 40 MG tablet TAKE 1 TABLET BY MOUTH EVERY DAY 90 tablet 3   traMADol (ULTRAM) 50 MG tablet Take 1 tablet (50 mg total) by mouth every 8 (eight) hours as  needed for moderate pain. 30 tablet 0   triamcinolone cream (KENALOG) 0.1 % Apply 1 Application topically 3 (three) times daily. (Patient taking differently: Apply 1 Application topically 3 (three) times daily. As needed) 30 g 1   No current facility-administered medications for this visit.   Facility-Administered Medications Ordered in Other Visits  Medication Dose Route Frequency Provider Last Rate Last Admin   sodium iodide (I-123) capsule 401 microcurie  401 microcurie Oral Once Signa Kell, MD        VITAL SIGNS: There were no vitals taken for this visit. There were no vitals filed for this visit.  Estimated body mass index is 33.63 kg/m as calculated from the following:   Height as of 10/30/22: 5\' 1"  (1.549 m).   Weight as of 10/30/22: 178  lb (80.7 kg).   PERFORMANCE STATUS (ECOG) : 1 - Symptomatic but completely ambulatory   Physical Exam General: NAD Cardiovascular: regular rate and rhythm Pulmonary: normal breathing pattern  Extremities: trace bilateral edema, no joint deformities Skin: no rashes Neurological: AAO x4  IMPRESSION:   Pain Patient endorses ongoing arthritic leg and back pain. Some chest discomfort. She is hopeful for some improvement over time. Is using lidocaine patches and topical creams. We discussed use of Celebrex however she did not start out of concern for medication interactions. She has medication on hand in addition to tramadol. She will soon be on maintenance treatment and at this time may consider use of medication.   We discussed use of continued topical treatments for support.    Patient understands we are available as needed.    PLAN:  Extensive discussion regarding nonpharmacological methods to assist with her pain and discomfort.  Education also provided on use of oral medications.   Open to use of medication for pain that is not opioid classification in the near future.  I will plan to see patient back in 6-8 weeks in collaboration to other oncology appointments.    Patient expressed understanding and was in agreement with this plan. She also understands that She can call the clinic at any time with any questions, concerns, or complaints.   Any controlled substances utilized were prescribed in the context of palliative care. PDMP has been reviewed.    Visit consisted of counseling and education dealing with the complex and emotionally intense issues of symptom management and palliative care in the setting of serious and potentially life-threatening illness.Greater than 50%  of this time was spent counseling and coordinating care related to the above assessment and plan.  [name], AGPCNP-BC  Palliative Medicine Team/[facility]  *Please note that  this is a verbal dictation therefore any spelling or grammatical errors are due to the "Dragon Medical One" system interpretation.

## 2022-11-03 ENCOUNTER — Ambulatory Visit (HOSPITAL_BASED_OUTPATIENT_CLINIC_OR_DEPARTMENT_OTHER)
Admission: RE | Admit: 2022-11-03 | Discharge: 2022-11-03 | Disposition: A | Discharge status: Home / Self Care | Payer: Medicare Other | Source: Ambulatory Visit | Attending: Physician Assistant | Admitting: Physician Assistant

## 2022-11-03 ENCOUNTER — Other Ambulatory Visit: Payer: Self-pay | Admitting: Endocrinology

## 2022-11-03 ENCOUNTER — Inpatient Hospital Stay: Payer: Medicare Other | Admitting: Nurse Practitioner

## 2022-11-03 ENCOUNTER — Inpatient Hospital Stay (HOSPITAL_BASED_OUTPATIENT_CLINIC_OR_DEPARTMENT_OTHER): Payer: Medicare Other | Admitting: Physician Assistant

## 2022-11-03 ENCOUNTER — Inpatient Hospital Stay: Payer: Medicare Other | Attending: Physician Assistant

## 2022-11-03 VITALS — BP 144/64 | HR 68 | Temp 98.4°F | Resp 18 | Wt 177.1 lb

## 2022-11-03 DIAGNOSIS — C9000 Multiple myeloma not having achieved remission: Secondary | ICD-10-CM

## 2022-11-03 DIAGNOSIS — G893 Neoplasm related pain (acute) (chronic): Secondary | ICD-10-CM | POA: Insufficient documentation

## 2022-11-03 DIAGNOSIS — Z79899 Other long term (current) drug therapy: Secondary | ICD-10-CM | POA: Insufficient documentation

## 2022-11-03 DIAGNOSIS — Z803 Family history of malignant neoplasm of breast: Secondary | ICD-10-CM | POA: Insufficient documentation

## 2022-11-03 DIAGNOSIS — R600 Localized edema: Secondary | ICD-10-CM

## 2022-11-03 DIAGNOSIS — D509 Iron deficiency anemia, unspecified: Secondary | ICD-10-CM

## 2022-11-03 DIAGNOSIS — D649 Anemia, unspecified: Secondary | ICD-10-CM | POA: Diagnosis not present

## 2022-11-03 DIAGNOSIS — E0590 Thyrotoxicosis, unspecified without thyrotoxic crisis or storm: Secondary | ICD-10-CM

## 2022-11-03 LAB — CBC WITH DIFFERENTIAL (CANCER CENTER ONLY)
Abs Immature Granulocytes: 0.01 10*3/uL (ref 0.00–0.07)
Basophils Absolute: 0.1 10*3/uL (ref 0.0–0.1)
Basophils Relative: 2 %
Eosinophils Absolute: 0.2 10*3/uL (ref 0.0–0.5)
Eosinophils Relative: 4 %
HCT: 33.4 % — ABNORMAL LOW (ref 36.0–46.0)
Hemoglobin: 10.7 g/dL — ABNORMAL LOW (ref 12.0–15.0)
Immature Granulocytes: 0 %
Lymphocytes Relative: 21 %
Lymphs Abs: 1.1 10*3/uL (ref 0.7–4.0)
MCH: 28.2 pg (ref 26.0–34.0)
MCHC: 32 g/dL (ref 30.0–36.0)
MCV: 88.1 fL (ref 80.0–100.0)
Monocytes Absolute: 1 10*3/uL (ref 0.1–1.0)
Monocytes Relative: 20 %
Neutro Abs: 2.7 10*3/uL (ref 1.7–7.7)
Neutrophils Relative %: 53 %
Platelet Count: 238 10*3/uL (ref 150–400)
RBC: 3.79 MIL/uL — ABNORMAL LOW (ref 3.87–5.11)
RDW: 15 % (ref 11.5–15.5)
WBC Count: 5.1 10*3/uL (ref 4.0–10.5)
nRBC: 0 % (ref 0.0–0.2)

## 2022-11-03 LAB — FERRITIN: Ferritin: 26 ng/mL (ref 11–307)

## 2022-11-03 LAB — CMP (CANCER CENTER ONLY)
ALT: 8 U/L (ref 0–44)
AST: 10 U/L — ABNORMAL LOW (ref 15–41)
Albumin: 4.1 g/dL (ref 3.5–5.0)
Alkaline Phosphatase: 68 U/L (ref 38–126)
Anion gap: 6 (ref 5–15)
BUN: 12 mg/dL (ref 6–20)
CO2: 26 mmol/L (ref 22–32)
Calcium: 9.3 mg/dL (ref 8.9–10.3)
Chloride: 110 mmol/L (ref 98–111)
Creatinine: 0.72 mg/dL (ref 0.44–1.00)
GFR, Estimated: >60 mL/min (ref >60)
Glucose, Bld: 93 mg/dL (ref 70–99)
Potassium: 3.6 mmol/L (ref 3.5–5.1)
Sodium: 142 mmol/L (ref 135–145)
Total Bilirubin: 0.5 mg/dL (ref 0.3–1.2)
Total Protein: 7 g/dL (ref 6.5–8.1)

## 2022-11-03 LAB — IRON AND IRON BINDING CAPACITY (CC-WL,HP ONLY)
Iron: 30 ug/dL (ref 28–170)
Saturation Ratios: 8 % — ABNORMAL LOW (ref 10.4–31.8)
TIBC: 391 ug/dL (ref 250–450)
UIBC: 361 ug/dL (ref 148–442)

## 2022-11-03 NOTE — Telephone Encounter (Signed)
Patient would like to know if something can be sent in for her to start until she see Dr. Erroll Luna in October.

## 2022-11-03 NOTE — Progress Notes (Signed)
Bilateral lower extremity venous duplex has been completed. Preliminary results can be found in CV Proc through chart review.  Results were given to Georga Kaufmann PA.  11/03/22 12:32 PM Olen Cordial RVT

## 2022-11-03 NOTE — Progress Notes (Unsigned)
[facility] [phone]   [phone]  PROGRESS NOTE  Patient Care Team: Margaree Mackintosh, MD as PCP - General (Internal Medicine)  Hematological/Oncological History # Multiple Myeloma with Plasmacytoma 03/03/2022: established with [facility]cology  04/15/2022: CT biopsy and bone marrow biopsy confirmed the presence of a plasmacytoma and bone marrow involvement of multiple myeloma.  05/05/2022: Cycle 1 Day 1 of VRD therapy 05/27/2022: Cycle 2 Day 1 of VRD therapy 06/17/2022: Cycle 3 Day 1 of VRD therapy 07/08/2022: Cycle 4 Day 1 of VRD therapy 07/29/2022: Cycle 5 Day 1 of VRD therapy 08/19/2022: Cycle 6 Day 1 of VRD therapy 09/09/2022: Cycle 7 Day 1 of VRD therapy 09/30/2022: Cycle 8 Day 1 of VRD therapy 10/23/2022: Started maintenance Revlimid therapy  Interval History:  Sonya Franklin 57 y.o. female with medical history significant for recently diagnosed multiple myeloma with plasmacytoma who presents for a follow up visit. The patient's last visit was on 10/07/2022. She presents today for a follow up after starting maintenance Revlimid therapy.   On exam today Mrs. Winningham reports she is overall stable with her energy and appetite. She reports worsening bilateral lower extremity swelling that is tender and warm to touch. She is currently on lasix 40 mg daily with minimal improvement. She denies nausea, vomiting or bowel habit changes. She continues to have chronic sacral pain that is managed with Tylenol and lidocaine patch.  She denies fevers, chills, sweats, shortness of breath, chest pain or cough. She has no other complaints. Rest of the 10 point ROS is below.   MEDICAL HISTORY:  Past Medical History:  Diagnosis Date   Ankylosing spondylitis (HCC)    Anxiety    Arthritis    Breast cancer (HCC)    right breast   Bursitis of hip, right    Cardiac arrhythmia    Carotid stenosis 08/26/2021   GE reflux    Hiatal hernia    Hypertension     Hyperthyroidism    Ischemic bowel disease (HCC)    Multiple myeloma not having achieved remission (HCC)    Prediabetes 08/26/2021   Snoring 03/21/2014   Vitamin D deficiency     SURGICAL HISTORY: Past Surgical History:  Procedure Laterality Date   ABDOMINAL HYSTERECTOMY     BREAST LUMPECTOMY     right   MASTECTOMY     double    SOCIAL HISTORY: Social History   Socioeconomic History   Marital status: Divorced    Spouse name: Not on file   Number of children: 1   Years of education: Not on file   Highest education level: Not on file  Occupational History    Employer: UNEMPLOYED  Tobacco Use   Smoking status: Never   Smokeless tobacco: Never  Vaping Use   Vaping status: Never Used  Substance and Sexual Activity   Alcohol use: No   Drug use: No   Sexual activity: Yes  Other Topics Concern   Not on file  Social History Narrative   Caffeine occasional drinker.   Not working/disabled.  12th grader. Divorced, one kids.       Social history: She receives disability benefits for history of breast cancer 2007 and ankylosing spondylitis.  She resides with special needs son.  She is divorced.  Non-smoker.  No alcohol consumption.       Family history: Father with history of prostate cancer.  Patient's mother was diagnosed with breast cancer at age 60.  Maternal aunt with history  of breast cancer.  3 brothers.   Right handed   Caffeine occas   One level stays on this level       Social Determinants of Health   Financial Resource Strain: Low Risk  (08/26/2021)   Overall Financial Resource Strain (CARDIA)    Difficulty of Paying Living Expenses: Not hard at all  Food Insecurity: No Food Insecurity (05/25/2022)   Hunger Vital Sign    Worried About Running Out of Food in the Last Year: Never true    Ran Out of Food in the Last Year: Never true  Transportation Needs: No Transportation Needs (08/26/2021)   PRAPARE - Administrator, Civil Service (Medical): No     Lack of Transportation (Non-Medical): No  Physical Activity: Inactive (08/26/2021)   Exercise Vital Sign    Days of Exercise per Week: 0 days    Minutes of Exercise per Session: 0 min  Stress: Not on file  Social Connections: Not on file  Intimate Partner Violence: Not At Risk (04/21/2022)   Humiliation, Afraid, Rape, and Kick questionnaire    Fear of Current or Ex-Partner: No    Emotionally Abused: No    Physically Abused: No    Sexually Abused: No    FAMILY HISTORY: Family History  Problem Relation Age of Onset   Thyroid disease Mother    Diabetes Mother    Hypertension Mother    Hyperlipidemia Mother    High blood pressure Father    High Cholesterol Father    Colitis Brother    Goiter Maternal Grandmother    Breast cancer Maternal Aunt     ALLERGIES:  is allergic to shellfish allergy, blue dyes (parenteral), and hydrochlorothiazide.  MEDICATIONS:  Current Outpatient Medications  Medication Sig Dispense Refill   acetaminophen (TYLENOL) 500 MG tablet Take 500 mg by mouth every 6 (six) hours as needed for moderate pain.     acyclovir (ZOVIRAX) 400 MG tablet TAKE 1 TABLET BY MOUTH TWICE A DAY 180 tablet 1   ALPRAZolam (XANAX) 0.5 MG tablet Take 1 tablet (0.5 mg total) by mouth 3 (three) times daily as needed for anxiety. 60 tablet 2   amLODipine (NORVASC) 5 MG tablet TAKE 1 TABLET (5 MG TOTAL) BY MOUTH DAILY. 90 tablet 2   aspirin EC 81 MG tablet Take 81 mg by mouth daily. Swallow whole.     celecoxib (CELEBREX) 100 MG capsule Take 1 capsule (100 mg total) by mouth 2 (two) times daily. 30 capsule 0   furosemide (LASIX) 20 MG tablet Take 1 tablet (20 mg total) by mouth daily as needed. 90 tablet 0   hyoscyamine (ANASPAZ) 0.125 MG TBDP disintergrating tablet Place 1 tablet (0.125 mg total) under the tongue every 4 (four) hours as needed for cramping. 30 tablet 0   KLOR-CON M20 20 MEQ tablet TAKE 2 TABLETS BY MOUTH DAILY 180 tablet 1   lenalidomide (REVLIMID) 10 MG capsule Take 1  capsule (10 mg total) by mouth daily. [employer] Auth #  [id number]     Date Obtained 10/23/22  Take 1 capsule daily for 21 days then none for 7 days 21 capsule 0   lidocaine (LIDODERM) 5 % Place 1 patch onto the skin daily. Remove & Discard patch within 12 hours or as directed by MD 30 patch 1   metoprolol tartrate (LOPRESSOR) 25 MG tablet ONE HALF TAB BY MOUTH UP TO TWICE DAILY IF NEEDED FOR PALPITATIONS 90 tablet 1   nystatin (MYCOSTATIN) 100000 UNIT/ML suspension Take  5 mLs (500,000 Units total) by mouth 4 (four) times daily. 60 mL 0   olopatadine (PATANOL) 0.1 % ophthalmic solution Place 1 drop into both eyes 2 (two) times daily as needed. 5 mL PRN   pantoprazole (PROTONIX) 40 MG tablet TAKE 1 TABLET BY MOUTH EVERY DAY 90 tablet 3   triamcinolone cream (KENALOG) 0.1 % Apply 1 Application topically 3 (three) times daily. (Patient taking differently: Apply 1 Application topically 3 (three) times daily. As needed) 30 g 1   traMADol (ULTRAM) 50 MG tablet Take 1 tablet (50 mg total) by mouth every 8 (eight) hours as needed for moderate pain. (Patient not taking: Reported on 11/03/2022) 30 tablet 0   No current facility-administered medications for this visit.    REVIEW OF SYSTEMS:   Constitutional: ( - ) fevers, ( - )  chills , ( - ) night sweats Eyes: ( - ) blurriness of vision, ( - ) double vision, ( - ) watery eyes Ears, nose, mouth, throat, and face: ( - ) mucositis, ( - ) sore throat Respiratory: ( - ) cough, ( - ) dyspnea, ( - ) wheezes Cardiovascular: ( - ) palpitation, ( - ) chest discomfort, ( + ) lower extremity swelling Gastrointestinal:  ( - ) nausea, ( - ) heartburn, ( - ) change in bowel habits Skin: ( - ) abnormal skin rashes Lymphatics: ( - ) new lymphadenopathy, ( - ) easy bruising Neurological: ( - ) numbness, ( - ) tingling, ( - ) new weaknesses Behavioral/Psych: ( - ) mood change, ( - ) new changes  All other systems were reviewed with the patient and are negative.  PHYSICAL  EXAMINATION: ECOG PERFORMANCE STATUS: 1 - Symptomatic but completely ambulatory  Vitals:   11/03/22 1134  BP: (!) 144/64  Pulse: 68  Resp: 18  Temp: 98.4 F (36.9 C)  SpO2: 100%     Filed Weights   11/03/22 1134  Weight: 177 lb 1.6 oz (80.3 kg)   GENERAL: Well-appearing middle-aged African-American female, alert, no distress and comfortable SKIN: skin color, texture, turgor are normal, no rashes or significant lesions EYES: conjunctiva are pink and non-injected, sclera clear LUNGS: clear to auscultation and percussion with normal breathing effort HEART: regular rate & rhythm and no murmurs. Bilateral lower extremity edema that is warm to touch and tender to palpation. Musculoskeletal: no cyanosis of digits and no clubbing  PSYCH: alert & oriented x 3, fluent speech NEURO: no focal motor/sensory deficits  LABORATORY DATA:  I have reviewed the data as listed    Latest Ref Rng & Units 11/03/2022   10:41 AM 10/30/2022   12:00 PM 10/14/2022    7:54 AM  CBC  WBC 4.0 - 10.5 K/uL 5.1  6.1  5.7   Hemoglobin 12.0 - 15.0 g/dL 16.1  09.6  04.5   Hematocrit 36.0 - 46.0 % 33.4  32.2  33.2   Platelets 150 - 400 K/uL 238  249  203        Latest Ref Rng & Units 11/03/2022   10:41 AM 10/30/2022   12:00 PM 10/14/2022    7:54 AM  CMP  Glucose 70 - 99 mg/dL 93  92  409   BUN 6 - 20 mg/dL 12  9  13    Creatinine 0.44 - 1.00 mg/dL 8.11  9.14  7.82   Sodium 135 - 145 mmol/L 142  142  141   Potassium 3.5 - 5.1 mmol/L 3.6  4.0  3.4  Chloride 98 - 111 mmol/L 110  109  108   CO2 22 - 32 mmol/L 26  24  25    Calcium 8.9 - 10.3 mg/dL 9.3  9.3  9.1   Total Protein 6.5 - 8.1 g/dL 7.0  6.5  6.2   Total Bilirubin 0.3 - 1.2 mg/dL 0.5  0.4  0.5   Alkaline Phos 38 - 126 U/L 68   62   AST 15 - 41 U/L 10  10  10    ALT 0 - 44 U/L 8  10  15      Lab Results  Component Value Date   MPROTEIN Not Observed 09/30/2022   MPROTEIN Not Observed 09/09/2022   MPROTEIN Not Observed 08/19/2022   Lab Results   Component Value Date   KPAFRELGTCHN 12.3 09/30/2022   KPAFRELGTCHN 14.1 09/09/2022   KPAFRELGTCHN 14.1 08/19/2022   LAMBDASER 8.6 09/30/2022   LAMBDASER 10.2 09/09/2022   LAMBDASER 13.2 08/19/2022   KAPLAMBRATIO 1.43 09/30/2022   KAPLAMBRATIO 1.38 09/09/2022   KAPLAMBRATIO 1.07 08/19/2022     RADIOGRAPHIC STUDIES: VAS [place] LOWER EXTREMITY VENOUS (DVT)  Result Date: 11/03/2022  Lower Venous DVT Study Patient Name:  ELI RYDALCH  Date of Exam:   11/03/2022 Medical Rec #: [id number]       Accession #:    [id number] Date of Birth: [birth year]        Patient Gender: F Patient Age:   57 years Exam Location:  [facility] Procedure:      VAS [place] LOWER EXTREMITY VENOUS (DVT) Referring Phys: Georga Kaufmann --------------------------------------------------------------------------------  Indications: Swelling.  Risk Factors: Cancer. Comparison Study: No prior studies. Performing Technologist: Chanda Busing RVT  Examination Guidelines: A complete evaluation includes B-mode imaging, spectral Doppler, color Doppler, and power Doppler as needed of all accessible portions of each vessel. Bilateral testing is considered an integral part of a complete examination. Limited examinations for reoccurring indications may be performed as noted. The reflux portion of the exam is performed with the patient in reverse Trendelenburg.  +---------+---------------+---------+-----------+----------+--------------+ RIGHT    CompressibilityPhasicitySpontaneityPropertiesThrombus Aging +---------+---------------+---------+-----------+----------+--------------+ CFV      Full           Yes      Yes                                 +---------+---------------+---------+-----------+----------+--------------+ SFJ      Full                                                        +---------+---------------+---------+-----------+----------+--------------+ FV Prox  Full                                                         +---------+---------------+---------+-----------+----------+--------------+ FV Mid   Full                                                        +---------+---------------+---------+-----------+----------+--------------+ FV  DistalFull                                                        +---------+---------------+---------+-----------+----------+--------------+ PFV      Full                                                        +---------+---------------+---------+-----------+----------+--------------+ POP      Full           Yes      Yes                                 +---------+---------------+---------+-----------+----------+--------------+ PTV      Full                                                        +---------+---------------+---------+-----------+----------+--------------+ PERO     Full                                                        +---------+---------------+---------+-----------+----------+--------------+   +---------+---------------+---------+-----------+----------+--------------+ LEFT     CompressibilityPhasicitySpontaneityPropertiesThrombus Aging +---------+---------------+---------+-----------+----------+--------------+ CFV      Full           Yes      Yes                                 +---------+---------------+---------+-----------+----------+--------------+ SFJ      Full                                                        +---------+---------------+---------+-----------+----------+--------------+ FV Prox  Full                                                        +---------+---------------+---------+-----------+----------+--------------+ FV Mid   Full                                                        +---------+---------------+---------+-----------+----------+--------------+ FV DistalFull                                                         +---------+---------------+---------+-----------+----------+--------------+  PFV      Full                                                        +---------+---------------+---------+-----------+----------+--------------+ POP      Full           Yes      Yes                                 +---------+---------------+---------+-----------+----------+--------------+ PTV      Full                                                        +---------+---------------+---------+-----------+----------+--------------+ PERO     Full                                                        +---------+---------------+---------+-----------+----------+--------------+     Summary: RIGHT: - There is no evidence of deep vein thrombosis in the lower extremity.  - No cystic structure found in the popliteal fossa.  LEFT: - There is no evidence of deep vein thrombosis in the lower extremity.  - No cystic structure found in the popliteal fossa.  *See table(s) above for measurements and observations.    Preliminary     ASSESSMENT & PLAN MARILOUISE DAOUST is a 57 y.o. female with medical history significant for newly diagnosed multiple myeloma with plasmacytoma who presents for a follow up visit.  # Multiple Myeloma with Plasmactyoma --Confirmed with L5 bone lesion biopsy and bone marrow biopsy. --Started VRD therapy on 05/05/2022.  --Patient completed her consultation with [facility] on 08/13/2022.  She notes that she would prefer to pursue maintenance therapy over bone marrow transplant. --After 8 cycles, transitioned to maintenance Revlimid 10 mg PO daily 21 days on, 7 days off on 10/23/2022.  PLAN: --Currently on maintenance therapy with Revlimid 10 mg p.o. daily 21 days on and 7 days off. --Labs today reviewed and adequate for treatment.  Labs show white blood cell count 5.1, hemoglobin 10.7, MCV 88.1, and platelets of 238. Creatinine and calcium levels are normal.  --Last myeloma labs from  7/172024 showed M protein was undetectable with normal serum free light chains.  --Continue with Revlimid therapy without any dose modifications.  --RTC in 4 weeks with repeat labs   #B/L lower extremity edema: --Need to rule out DVT in the setting of Revlimid therapy. --Doppler US obtained today ruled out DVT.  --Continue on lasix therapy managed by PCP  #Normocytic anemia: --Etiologies include Revlimid therapy --Hgb has been stable between 10-11 g/dL --Iron panel checked today that shows iron saturation 8%, ferritin 26 --Recommend iron supplementation, either PO or IV iron based on patient's tolerance.   # Jitteriness/Palpitations: --Likely secondary to  abnormal thyroid function (hyperthyroidism), symptoms improving with thyroid management.   # Right lower extremity neuropathy: --Greatly improved --Monitor for now.   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 Medication includes lidocaine pain patch and tylenol with mild relief. --Patient is not interested in palliative radiation at this time.   #Supportive Care -- chemotherapy education completed -- port placement not required.  -- zofran 8mg  q8H PRN and compazine 10mg  PO q6H for nausea -- acyclovir 400mg  PO BID for VCZ prophylaxis -- Pain medication as noted above  No orders of the defined types were placed in this encounter.  All questions were answered. The patient knows to call the clinic with any problems, questions or concerns.  I have spent a total of 30 minutes minutes of face-to-face and non-face-to-face time, preparing to see the patient, performing a medically appropriate examination, counseling and educating the  patient, documenting clinical information in the electronic health record, independently interpreting results and communicating results to the patient, and care coordination.    Georga Kaufmann PA-C Dept of Hematology and Oncology [facility] Phone: [phone]   11/03/2022 1:02 PM

## 2022-11-04 ENCOUNTER — Encounter: Payer: Self-pay | Admitting: Hematology and Oncology

## 2022-11-04 ENCOUNTER — Telehealth: Payer: Self-pay

## 2022-11-04 DIAGNOSIS — D509 Iron deficiency anemia, unspecified: Secondary | ICD-10-CM

## 2022-11-04 LAB — KAPPA/LAMBDA LIGHT CHAINS
Kappa free light chain: 16.2 mg/L (ref 3.3–19.4)
Kappa, lambda light chain ratio: 1.13 (ref 0.26–1.65)
Lambda free light chains: 14.3 mg/L (ref 5.7–26.3)

## 2022-11-04 NOTE — Telephone Encounter (Signed)
Radiology will contact doctor to request a stat read of the report. Patient states she just did labs yesterday and would like a message sent to the doctor at cancer center to add labs on.

## 2022-11-04 NOTE — Telephone Encounter (Signed)
Pt is still c/o itchy, red eyes and is requesting a prescription for an eye drop be sent to [facility].

## 2022-11-04 NOTE — Telephone Encounter (Signed)
-----   Message from Briant Cedar sent at 11/04/2022  7:14 AM EDT ----- Patient's iron levels are low which can contribute to her anemia. Can you see if she is taking iron pills? Otherwise please see if she is willing to receive IV iron?

## 2022-11-04 NOTE — Telephone Encounter (Signed)
Pt advised of lab results with [facility].  She has not been taking oral iron and she is in agreement to receive IV iron.

## 2022-11-05 ENCOUNTER — Other Ambulatory Visit: Payer: Self-pay

## 2022-11-05 ENCOUNTER — Other Ambulatory Visit: Payer: Self-pay | Admitting: *Deleted

## 2022-11-05 ENCOUNTER — Telehealth: Payer: Self-pay | Admitting: Physician Assistant

## 2022-11-05 ENCOUNTER — Encounter: Payer: Self-pay | Admitting: Physician Assistant

## 2022-11-05 DIAGNOSIS — C9000 Multiple myeloma not having achieved remission: Secondary | ICD-10-CM

## 2022-11-05 DIAGNOSIS — D509 Iron deficiency anemia, unspecified: Secondary | ICD-10-CM

## 2022-11-05 DIAGNOSIS — R7989 Other specified abnormal findings of blood chemistry: Secondary | ICD-10-CM

## 2022-11-05 LAB — T4, FREE: Free T4: 0.89 ng/dL (ref 0.61–1.12)

## 2022-11-05 LAB — TSH: TSH: 0.012 u[IU]/mL — ABNORMAL LOW (ref 0.350–4.500)

## 2022-11-05 NOTE — Addendum Note (Signed)
Addended by: Briant Cedar on: 11/05/2022 01:57 PM   Modules accepted: Orders

## 2022-11-05 NOTE — Telephone Encounter (Signed)
Left patient a message in regards to getting them scheduled for iron infusions, left callback number for them to reach back

## 2022-11-06 LAB — T3, FREE: T3, Free: 4 pg/mL (ref 2.0–4.4)

## 2022-11-06 NOTE — Telephone Encounter (Signed)
Second attempt to reach pt regarding her eye drops.  Had to leave another message for her to return my call

## 2022-11-08 LAB — MULTIPLE MYELOMA PANEL, SERUM
Albumin SerPl Elph-Mcnc: 3.5 g/dL (ref 2.9–4.4)
Albumin/Glob SerPl: 1.3 (ref 0.7–1.7)
Alpha 1: 0.3 g/dL (ref 0.0–0.4)
Alpha2 Glob SerPl Elph-Mcnc: 0.8 g/dL (ref 0.4–1.0)
B-Globulin SerPl Elph-Mcnc: 1.1 g/dL (ref 0.7–1.3)
Gamma Glob SerPl Elph-Mcnc: 0.7 g/dL (ref 0.4–1.8)
Globulin, Total: 2.9 g/dL (ref 2.2–3.9)
IgA: 90 mg/dL (ref 87–352)
IgG (Immunoglobin G), Serum: 839 mg/dL (ref 586–1602)
IgM (Immunoglobulin M), Srm: 17 mg/dL — ABNORMAL LOW (ref 26–217)
Total Protein ELP: 6.4 g/dL (ref 6.0–8.5)

## 2022-11-09 ENCOUNTER — Other Ambulatory Visit: Payer: Self-pay | Admitting: Endocrinology

## 2022-11-09 DIAGNOSIS — E042 Nontoxic multinodular goiter: Secondary | ICD-10-CM

## 2022-11-09 DIAGNOSIS — E0590 Thyrotoxicosis, unspecified without thyrotoxic crisis or storm: Secondary | ICD-10-CM

## 2022-11-13 NOTE — Patient Instructions (Addendum)
Increase Lasix to 40 mg daily. May be able to decrease when Fall arrives with cooler weather. Watch salt intake. Oncology and I will monitor creatinine and potassium. CBC and C-met are stable.

## 2022-11-19 ENCOUNTER — Other Ambulatory Visit: Payer: Self-pay

## 2022-11-19 MED ORDER — LENALIDOMIDE 10 MG PO CAPS: 10.0000 mg | ORAL_CAPSULE | Freq: Every day | ORAL | 0 refills | Status: DC

## 2022-11-24 ENCOUNTER — Other Ambulatory Visit: Payer: Self-pay | Admitting: Physician Assistant

## 2022-11-24 ENCOUNTER — Inpatient Hospital Stay: Payer: Medicare Other | Attending: Physician Assistant

## 2022-11-24 VITALS — BP 143/76 | HR 55 | Temp 98.4°F | Resp 15

## 2022-11-24 DIAGNOSIS — Z79899 Other long term (current) drug therapy: Secondary | ICD-10-CM | POA: Diagnosis not present

## 2022-11-24 DIAGNOSIS — Z791 Long term (current) use of non-steroidal anti-inflammatories (NSAID): Secondary | ICD-10-CM | POA: Insufficient documentation

## 2022-11-24 DIAGNOSIS — Z7961 Long term (current) use of immunomodulator: Secondary | ICD-10-CM | POA: Diagnosis not present

## 2022-11-24 DIAGNOSIS — D649 Anemia, unspecified: Secondary | ICD-10-CM | POA: Diagnosis not present

## 2022-11-24 DIAGNOSIS — K219 Gastro-esophageal reflux disease without esophagitis: Secondary | ICD-10-CM | POA: Insufficient documentation

## 2022-11-24 DIAGNOSIS — M459 Ankylosing spondylitis of unspecified sites in spine: Secondary | ICD-10-CM | POA: Diagnosis not present

## 2022-11-24 DIAGNOSIS — R002 Palpitations: Secondary | ICD-10-CM | POA: Insufficient documentation

## 2022-11-24 DIAGNOSIS — I10 Essential (primary) hypertension: Secondary | ICD-10-CM | POA: Insufficient documentation

## 2022-11-24 DIAGNOSIS — E559 Vitamin D deficiency, unspecified: Secondary | ICD-10-CM | POA: Diagnosis not present

## 2022-11-24 DIAGNOSIS — G893 Neoplasm related pain (acute) (chronic): Secondary | ICD-10-CM | POA: Diagnosis not present

## 2022-11-24 DIAGNOSIS — K449 Diaphragmatic hernia without obstruction or gangrene: Secondary | ICD-10-CM | POA: Diagnosis not present

## 2022-11-24 DIAGNOSIS — C9000 Multiple myeloma not having achieved remission: Secondary | ICD-10-CM | POA: Diagnosis not present

## 2022-11-24 DIAGNOSIS — Z803 Family history of malignant neoplasm of breast: Secondary | ICD-10-CM | POA: Insufficient documentation

## 2022-11-24 DIAGNOSIS — Z7982 Long term (current) use of aspirin: Secondary | ICD-10-CM | POA: Diagnosis not present

## 2022-11-24 DIAGNOSIS — R600 Localized edema: Secondary | ICD-10-CM | POA: Insufficient documentation

## 2022-11-24 DIAGNOSIS — D509 Iron deficiency anemia, unspecified: Secondary | ICD-10-CM

## 2022-11-24 MED ORDER — SODIUM CHLORIDE 0.9 % IV SOLN: Freq: Once | INTRAVENOUS | Status: AC

## 2022-11-24 MED ORDER — DOXYCYCLINE HYCLATE 100 MG PO TABS: 100.0000 mg | ORAL_TABLET | Freq: Two times a day (BID) | ORAL | 0 refills | Status: DC

## 2022-11-24 MED ORDER — SODIUM CHLORIDE 0.9 % IV SOLN
510.0000 mg | Freq: Once | INTRAVENOUS | Status: AC
  Administered 2022-11-24: 510 mg via INTRAVENOUS
  Filled 2022-11-24: qty 510

## 2022-11-24 NOTE — Progress Notes (Signed)
Patient remained for 30 min post iron infusion.  Tolerated treatment well without incident.  VSS at discharge.  Ambulated to lobby.

## 2022-11-24 NOTE — Progress Notes (Signed)
I saw patient today in the infusion are due to worsening bilateral pitting lower extremity edema and warmth to touch. Distal pulses intact and good capillary refill. Reviewed that doppler US from 11/03/2022 was negative for DVT. She is currently on lasix 40 mg daily with minimal improved.   We can try 7 day course of doxycycline to evaluate for cellulitis. Patient is waiting to schedule her radioactive iodine treatment for her hyperthyroidism.   If doxycycline and radioactive iodine treatment does not improve her lower extremity edema, then we can temporarily hold Revlimid.   Patient expressed understanding of the plan provided.

## 2022-11-24 NOTE — Patient Instructions (Signed)
 Ferumoxytol Injection What is this medication? FERUMOXYTOL (FER ue MOX i tol) treats low levels of iron in your body (iron deficiency anemia). Iron is a mineral that plays an important role in making red blood cells, which carry oxygen from your lungs to the rest of your body. This medicine may be used for other purposes; ask your health care provider or pharmacist if you have questions. COMMON BRAND NAME(S): Feraheme What should I tell my care team before I take this medication? They need to know if you have any of these conditions: Anemia not caused by low iron levels High levels of iron in the blood Magnetic resonance imaging (MRI) test scheduled An unusual or allergic reaction to iron, other medications, foods, dyes, or preservatives Pregnant or trying to get pregnant Breastfeeding How should I use this medication? This medication is injected into a vein. It is given by your care team in a hospital or clinic setting. Talk to your care team the use of this medication in children. Special care may be needed. Overdosage: If you think you have taken too much of this medicine contact a poison control center or emergency room at once. NOTE: This medicine is only for you. Do not share this medicine with others. What if I miss a dose? It is important not to miss your dose. Call your care team if you are unable to keep an appointment. What may interact with this medication? Other iron products This list may not describe all possible interactions. Give your health care provider a list of all the medicines, herbs, non-prescription drugs, or dietary supplements you use. Also tell them if you smoke, drink alcohol, or use illegal drugs. Some items may interact with your medicine. What should I watch for while using this medication? Visit your care team regularly. Tell your care team if your symptoms do not start to get better or if they get worse. You may need blood work done while you are taking this  medication. You may need to follow a special diet. Talk to your care team. Foods that contain iron include: whole grains/cereals, dried fruits, beans, or peas, leafy green vegetables, and organ meats (liver, kidney). What side effects may I notice from receiving this medication? Side effects that you should report to your care team as soon as possible: Allergic reactions--skin rash, itching, hives, swelling of the face, lips, tongue, or throat Low blood pressure--dizziness, feeling faint or lightheaded, blurry vision Shortness of breath Side effects that usually do not require medical attention (report to your care team if they continue or are bothersome): Flushing Headache Joint pain Muscle pain Nausea Pain, redness, or irritation at injection site This list may not describe all possible side effects. Call your doctor for medical advice about side effects. You may report side effects to FDA at 1-800-FDA-1088. Where should I keep my medication? This medication is given in a hospital or clinic. It will not be stored at home. NOTE: This sheet is a summary. It may not cover all possible information. If you have questions about this medicine, talk to your doctor, pharmacist, or health care provider.  2024 Elsevier/Gold Standard (2022-08-07 00:00:00)

## 2022-11-27 ENCOUNTER — Other Ambulatory Visit: Payer: Self-pay | Admitting: Internal Medicine

## 2022-11-30 ENCOUNTER — Inpatient Hospital Stay: Payer: Medicare Other

## 2022-11-30 ENCOUNTER — Inpatient Hospital Stay: Payer: Medicare Other | Admitting: Hematology and Oncology

## 2022-11-30 ENCOUNTER — Other Ambulatory Visit: Payer: Self-pay | Admitting: Hematology and Oncology

## 2022-11-30 VITALS — BP 148/71 | HR 58 | Temp 98.1°F | Resp 16 | Wt 173.8 lb

## 2022-11-30 DIAGNOSIS — K219 Gastro-esophageal reflux disease without esophagitis: Secondary | ICD-10-CM | POA: Diagnosis not present

## 2022-11-30 DIAGNOSIS — R002 Palpitations: Secondary | ICD-10-CM | POA: Diagnosis not present

## 2022-11-30 DIAGNOSIS — D509 Iron deficiency anemia, unspecified: Secondary | ICD-10-CM

## 2022-11-30 DIAGNOSIS — M459 Ankylosing spondylitis of unspecified sites in spine: Secondary | ICD-10-CM | POA: Diagnosis not present

## 2022-11-30 DIAGNOSIS — C9000 Multiple myeloma not having achieved remission: Secondary | ICD-10-CM

## 2022-11-30 DIAGNOSIS — D649 Anemia, unspecified: Secondary | ICD-10-CM | POA: Diagnosis not present

## 2022-11-30 DIAGNOSIS — R600 Localized edema: Secondary | ICD-10-CM | POA: Diagnosis not present

## 2022-11-30 DIAGNOSIS — E559 Vitamin D deficiency, unspecified: Secondary | ICD-10-CM | POA: Diagnosis not present

## 2022-11-30 DIAGNOSIS — K449 Diaphragmatic hernia without obstruction or gangrene: Secondary | ICD-10-CM | POA: Diagnosis not present

## 2022-11-30 DIAGNOSIS — I10 Essential (primary) hypertension: Secondary | ICD-10-CM | POA: Diagnosis not present

## 2022-11-30 DIAGNOSIS — Z803 Family history of malignant neoplasm of breast: Secondary | ICD-10-CM | POA: Diagnosis not present

## 2022-11-30 DIAGNOSIS — Z7982 Long term (current) use of aspirin: Secondary | ICD-10-CM | POA: Diagnosis not present

## 2022-11-30 DIAGNOSIS — Z791 Long term (current) use of non-steroidal anti-inflammatories (NSAID): Secondary | ICD-10-CM | POA: Diagnosis not present

## 2022-11-30 DIAGNOSIS — Z7961 Long term (current) use of immunomodulator: Secondary | ICD-10-CM | POA: Diagnosis not present

## 2022-11-30 DIAGNOSIS — Z79899 Other long term (current) drug therapy: Secondary | ICD-10-CM | POA: Diagnosis not present

## 2022-11-30 DIAGNOSIS — G893 Neoplasm related pain (acute) (chronic): Secondary | ICD-10-CM | POA: Diagnosis not present

## 2022-11-30 LAB — CBC WITH DIFFERENTIAL (CANCER CENTER ONLY)
Abs Immature Granulocytes: 0.01 10*3/uL (ref 0.00–0.07)
Basophils Absolute: 0.1 10*3/uL (ref 0.0–0.1)
Basophils Relative: 2 %
Eosinophils Absolute: 0.2 10*3/uL (ref 0.0–0.5)
Eosinophils Relative: 5 %
HCT: 35.9 % — ABNORMAL LOW (ref 36.0–46.0)
Hemoglobin: 11.7 g/dL — ABNORMAL LOW (ref 12.0–15.0)
Immature Granulocytes: 0 %
Lymphocytes Relative: 29 %
Lymphs Abs: 1.5 10*3/uL (ref 0.7–4.0)
MCH: 28.1 pg (ref 26.0–34.0)
MCHC: 32.6 g/dL (ref 30.0–36.0)
MCV: 86.3 fL (ref 80.0–100.0)
Monocytes Absolute: 0.7 10*3/uL (ref 0.1–1.0)
Monocytes Relative: 14 %
Neutro Abs: 2.5 10*3/uL (ref 1.7–7.7)
Neutrophils Relative %: 50 %
Platelet Count: 251 10*3/uL (ref 150–400)
RBC: 4.16 MIL/uL (ref 3.87–5.11)
RDW: 14.4 % (ref 11.5–15.5)
WBC Count: 5.1 10*3/uL (ref 4.0–10.5)
nRBC: 0 % (ref 0.0–0.2)

## 2022-11-30 LAB — LACTATE DEHYDROGENASE: LDH: 178 U/L (ref 98–192)

## 2022-11-30 LAB — CMP (CANCER CENTER ONLY)
ALT: 18 U/L (ref 0–44)
AST: 18 U/L (ref 15–41)
Albumin: 4.3 g/dL (ref 3.5–5.0)
Alkaline Phosphatase: 82 U/L (ref 38–126)
Anion gap: 7 (ref 5–15)
BUN: 11 mg/dL (ref 6–20)
CO2: 26 mmol/L (ref 22–32)
Calcium: 9.5 mg/dL (ref 8.9–10.3)
Chloride: 108 mmol/L (ref 98–111)
Creatinine: 0.68 mg/dL (ref 0.44–1.00)
GFR, Estimated: >60 mL/min (ref >60)
Glucose, Bld: 101 mg/dL — ABNORMAL HIGH (ref 70–99)
Potassium: 3.3 mmol/L — ABNORMAL LOW (ref 3.5–5.1)
Sodium: 141 mmol/L (ref 135–145)
Total Bilirubin: 0.6 mg/dL (ref 0.3–1.2)
Total Protein: 7.6 g/dL (ref 6.5–8.1)

## 2022-11-30 LAB — IRON AND IRON BINDING CAPACITY (CC-WL,HP ONLY)
Iron: 73 ug/dL (ref 28–170)
Saturation Ratios: 20 % (ref 10.4–31.8)
TIBC: 372 ug/dL (ref 250–450)
UIBC: 299 ug/dL (ref 148–442)

## 2022-11-30 NOTE — Progress Notes (Signed)
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  05/05/2022: Cycle 1 Day 1 of VRD therapy 05/27/2022: Cycle 2 Day 1 of VRD therapy 06/17/2022: Cycle 3 Day 1 of VRD therapy 07/08/2022: Cycle 4 Day 1 of VRD therapy 07/29/2022: Cycle 5 Day 1 of VRD therapy 08/19/2022: Cycle 6 Day 1 of VRD therapy 09/09/2022: Cycle 7 Day 1 of VRD therapy 09/30/2022: Cycle 8 Day 1 of VRD therapy 10/23/2022: Started maintenance Revlimid therapy  Interval History:  IRISA LECLEAR 57 y.o. female with medical history significant for recently diagnosed multiple myeloma with plasmacytoma who presents for a follow up visit. The patient's last visit was on 11/03/2022. She presents today for a follow up after starting maintenance Revlimid therapy.   On exam today Mrs. Szalkowski reports her ankles continue to swell and is thought to be either due to "thyroid or her Revlimid".  She notes that it causes discomfort.  She is wearing compression socks but she feels like it "cuts off circulation".  She notes that she is currently on her week off from the Revlimid.  She is tolerating it well and denies any nausea, vomiting, or diarrhea.  Her appetite is good and her weight has been steady.  She reports her energy is only about a 6 out of 10.  She reports that the IV iron infusion did help.  She is concerned that her lack of red meat in her diet may be part of the cause for iron deficiency.  She denies any new bone or back pain.  She denies fevers, chills, sweats, shortness of breath, chest pain or cough. She has no other complaints. Rest of the 10 point ROS is below.    MEDICAL HISTORY:  Past Medical History:  Diagnosis Date   Ankylosing spondylitis (HCC)    Anxiety    Arthritis    Breast cancer (HCC)    right breast   Bursitis of hip, right    Cardiac arrhythmia    Carotid stenosis 08/26/2021   GE reflux    Hiatal hernia    Hypertension    Hyperthyroidism    Ischemic bowel disease (HCC)    Multiple myeloma not having achieved remission (HCC)    Prediabetes 08/26/2021   Snoring 03/21/2014   Vitamin D deficiency     SURGICAL HISTORY: Past Surgical History:  Procedure Laterality Date   ABDOMINAL HYSTERECTOMY     BREAST LUMPECTOMY     right   MASTECTOMY     double    SOCIAL HISTORY: Social History   Socioeconomic History   Marital status: Divorced    Spouse name: Not on file   Number of children: 1   Years of education: Not on file   Highest education level: Not on file  Occupational History    Employer: UNEMPLOYED  Tobacco Use   Smoking status: Never   Smokeless tobacco: Never  Vaping Use   Vaping status: Never Used  Substance and Sexual Activity   Alcohol use: No   Drug use: No   Sexual activity: Yes  Other Topics Concern   Not on file  Social History Narrative   Caffeine occasional drinker.   Not working/disabled.  12th grader. Divorced, one kids.       Social history: She receives disability benefits for history of breast cancer 2007 and ankylosing spondylitis.  She resides with special needs son.  She is divorced.  Non-smoker.  No alcohol consumption.       Family history: Father with history of prostate cancer.  Patient's mother was diagnosed with breast cancer at age 65.  Maternal aunt with history of breast cancer.  3 brothers.   Right handed   Caffeine occas   One level stays on this level       Social Determinants of Health   Financial Resource Strain: Low Risk  (08/26/2021)   Overall Financial Resource Strain (CARDIA)    Difficulty of Paying Living Expenses: Not hard at all  Food Insecurity: No Food  Insecurity (05/25/2022)   Hunger Vital Sign    Worried About Running Out of Food in the Last Year: Never true    Ran Out of Food in the Last Year: Never true  Transportation Needs: No Transportation Needs (08/26/2021)   PRAPARE - Administrator, Civil Service (Medical): No    Lack of Transportation (Non-Medical): No  Physical Activity: Inactive (08/26/2021)   Exercise Vital Sign    Days of Exercise per Week: 0 days    Minutes of Exercise per Session: 0 min  Stress: Not on file  Social Connections: Not on file  Intimate Partner Violence: Not At Risk (04/21/2022)   Humiliation, Afraid, Rape, and Kick questionnaire    Fear of Current or Ex-Partner: No    Emotionally Abused: No    Physically Abused: No    Sexually Abused: No    FAMILY HISTORY: Family History  Problem Relation Age of Onset   Thyroid disease Mother    Diabetes Mother    Hypertension Mother    Hyperlipidemia Mother    High blood pressure Father    High Cholesterol Father    Colitis Brother    Goiter Maternal Grandmother    Breast cancer Maternal Aunt     ALLERGIES:  is allergic to shellfish allergy, blue dyes (parenteral), and hydrochlorothiazide.  MEDICATIONS:  Current Outpatient Medications  Medication Sig Dispense Refill   acetaminophen (TYLENOL) 500 MG tablet Take 500 mg by mouth every 6 (six) hours as needed for moderate pain.     acyclovir (ZOVIRAX) 400 MG tablet TAKE 1 TABLET BY MOUTH TWICE A DAY 180 tablet 1   ALPRAZolam (XANAX) 0.5 MG tablet Take 1 tablet (0.5 mg total) by mouth 3 (three) times daily as needed for anxiety. 60 tablet 2   amLODipine (NORVASC) 5 MG tablet TAKE 1 TABLET (5 MG TOTAL) BY MOUTH DAILY. 90 tablet 2   aspirin EC 81 MG tablet Take 81 mg by mouth daily. Swallow whole.     celecoxib (CELEBREX) 100 MG capsule Take 1 capsule (100 mg total) by mouth 2 (two) times daily. 30 capsule 0   doxycycline (VIBRA-TABS) 100 MG tablet Take 1 tablet (100 mg total) by mouth 2 (two) times  daily. 14 tablet 0   furosemide (LASIX) 20 MG tablet TAKE 1 TABLET BY MOUTH EVERY DAY AS NEEDED 90 tablet 0   hyoscyamine (ANASPAZ) 0.125 MG TBDP disintergrating tablet Place 1 tablet (0.125 mg total) under the tongue every 4 (four) hours as needed for cramping. 30 tablet 0   KLOR-CON M20 20 MEQ tablet TAKE 2 TABLETS BY MOUTH DAILY 180 tablet 1   lenalidomide (REVLIMID) 10 MG capsule Take 1 capsule (10  mg total) by mouth daily. [employer] Auth # [id number]   Date Obtained 11/19/22  Take 1 capsule daily for 21 days then none for 7 days 21 capsule 0   lidocaine (LIDODERM) 5 % Place 1 patch onto the skin daily. Remove & Discard patch within 12 hours or as directed by MD 30 patch 1   metoprolol tartrate (LOPRESSOR) 25 MG tablet ONE HALF TAB BY MOUTH UP TO TWICE DAILY IF NEEDED FOR PALPITATIONS 90 tablet 1   nystatin (MYCOSTATIN) 100000 UNIT/ML suspension Take 5 mLs (500,000 Units total) by mouth 4 (four) times daily. 60 mL 0   olopatadine (PATANOL) 0.1 % ophthalmic solution Place 1 drop into both eyes 2 (two) times daily as needed. 5 mL PRN   pantoprazole (PROTONIX) 40 MG tablet TAKE 1 TABLET BY MOUTH EVERY DAY 90 tablet 3   traMADol (ULTRAM) 50 MG tablet Take 1 tablet (50 mg total) by mouth every 8 (eight) hours as needed for moderate pain. (Patient not taking: Reported on 11/03/2022) 30 tablet 0   triamcinolone cream (KENALOG) 0.1 % Apply 1 Application topically 3 (three) times daily. (Patient taking differently: Apply 1 Application topically 3 (three) times daily. As needed) 30 g 1   No current facility-administered medications for this visit.    REVIEW OF SYSTEMS:   Constitutional: ( - ) fevers, ( - )  chills , ( - ) night sweats Eyes: ( - ) blurriness of vision, ( - ) double vision, ( - ) watery eyes Ears, nose, mouth, throat, and face: ( - ) mucositis, ( - ) sore throat Respiratory: ( - ) cough, ( - ) dyspnea, ( - ) wheezes Cardiovascular: ( - ) palpitation, ( - ) chest discomfort, ( + ) lower  extremity swelling Gastrointestinal:  ( - ) nausea, ( - ) heartburn, ( - ) change in bowel habits Skin: ( - ) abnormal skin rashes Lymphatics: ( - ) new lymphadenopathy, ( - ) easy bruising Neurological: ( - ) numbness, ( - ) tingling, ( - ) new weaknesses Behavioral/Psych: ( - ) mood change, ( - ) new changes  All other systems were reviewed with the patient and are negative.  PHYSICAL EXAMINATION: ECOG PERFORMANCE STATUS: 1 - Symptomatic but completely ambulatory  Vitals:   11/30/22 1411  BP: (!) 148/71  Pulse: (!) 58  Resp: 16  Temp: 98.1 F (36.7 C)  SpO2: 100%      Filed Weights   11/30/22 1411  Weight: 173 lb 12.8 oz (78.8 kg)    GENERAL: Well-appearing middle-aged African-American female, alert, no distress and comfortable SKIN: skin color, texture, turgor are normal, no rashes or significant lesions EYES: conjunctiva are pink and non-injected, sclera clear LUNGS: clear to auscultation and percussion with normal breathing effort HEART: regular rate & rhythm and no murmurs. Bilateral lower extremity edema that is warm to touch and tender to palpation. Musculoskeletal: no cyanosis of digits and no clubbing  PSYCH: alert & oriented x 3, fluent speech NEURO: no focal motor/sensory deficits  LABORATORY DATA:  I have reviewed the data as listed    Latest Ref Rng & Units 11/30/2022    1:38 PM 11/03/2022   10:41 AM 10/30/2022   12:00 PM  CBC  WBC 4.0 - 10.5 K/uL 5.1  5.1  6.1   Hemoglobin 12.0 - 15.0 g/dL 54.0  98.1  19.1   Hematocrit 36.0 - 46.0 % 35.9  33.4  32.2   Platelets 150 - 400 K/uL 251  238  249        Latest Ref Rng & Units 11/30/2022    1:38 PM 11/03/2022   10:41 AM 10/30/2022   12:00 PM  CMP  Glucose 70 - 99 mg/dL 130  93  92   BUN 6 - 20 mg/dL 11  12  9    Creatinine 0.44 - 1.00 mg/dL 8.65  7.84  6.96   Sodium 135 - 145 mmol/L 141  142  142   Potassium 3.5 - 5.1 mmol/L 3.3  3.6  4.0   Chloride 98 - 111 mmol/L 108  110  109   CO2 22 - 32 mmol/L 26   26  24    Calcium 8.9 - 10.3 mg/dL 9.5  9.3  9.3   Total Protein 6.5 - 8.1 g/dL 7.6  7.0  6.5   Total Bilirubin 0.3 - 1.2 mg/dL 0.6  0.5  0.4   Alkaline Phos 38 - 126 U/L 82  68    AST 15 - 41 U/L 18  10  10    ALT 0 - 44 U/L 18  8  10      Lab Results  Component Value Date   MPROTEIN Not Observed 11/03/2022   MPROTEIN Not Observed 09/30/2022   MPROTEIN Not Observed 09/09/2022   Lab Results  Component Value Date   KPAFRELGTCHN 16.2 11/03/2022   KPAFRELGTCHN 12.3 09/30/2022   KPAFRELGTCHN 14.1 09/09/2022   LAMBDASER 14.3 11/03/2022   LAMBDASER 8.6 09/30/2022   LAMBDASER 10.2 09/09/2022   KAPLAMBRATIO 1.13 11/03/2022   KAPLAMBRATIO 1.43 09/30/2022   KAPLAMBRATIO 1.38 09/09/2022     RADIOGRAPHIC STUDIES: VAS [place] LOWER EXTREMITY VENOUS (DVT)  Result Date: 11/03/2022  Lower Venous DVT Study Patient Name:  SHAMESHA SUH  Date of Exam:   11/03/2022 Medical Rec #: [id number]       Accession #:    [id number] Date of Birth: 1966/03/12        Patient Gender: F Patient Age:   78 years Exam Location:  [facility] Procedure:      VAS [place] LOWER EXTREMITY VENOUS (DVT) Referring Phys: Georga Kaufmann --------------------------------------------------------------------------------  Indications: Swelling.  Risk Factors: Cancer. Comparison Study: No prior studies. Performing Technologist: Chanda Busing RVT  Examination Guidelines: A complete evaluation includes B-mode imaging, spectral Doppler, color Doppler, and power Doppler as needed of all accessible portions of each vessel. Bilateral testing is considered an integral part of a complete examination. Limited examinations for reoccurring indications may be performed as noted. The reflux portion of the exam is performed with the patient in reverse Trendelenburg.  +---------+---------------+---------+-----------+----------+--------------+ RIGHT    CompressibilityPhasicitySpontaneityPropertiesThrombus Aging  +---------+---------------+---------+-----------+----------+--------------+ CFV      Full           Yes      Yes                                 +---------+---------------+---------+-----------+----------+--------------+ SFJ      Full                                                        +---------+---------------+---------+-----------+----------+--------------+ FV Prox  Full                                                        +---------+---------------+---------+-----------+----------+--------------+  FV Mid   Full                                                        +---------+---------------+---------+-----------+----------+--------------+ FV DistalFull                                                        +---------+---------------+---------+-----------+----------+--------------+ PFV      Full                                                        +---------+---------------+---------+-----------+----------+--------------+ POP      Full           Yes      Yes                                 +---------+---------------+---------+-----------+----------+--------------+ PTV      Full                                                        +---------+---------------+---------+-----------+----------+--------------+ PERO     Full                                                        +---------+---------------+---------+-----------+----------+--------------+   +---------+---------------+---------+-----------+----------+--------------+ LEFT     CompressibilityPhasicitySpontaneityPropertiesThrombus Aging +---------+---------------+---------+-----------+----------+--------------+ CFV      Full           Yes      Yes                                 +---------+---------------+---------+-----------+----------+--------------+ SFJ      Full                                                         +---------+---------------+---------+-----------+----------+--------------+ FV Prox  Full                                                        +---------+---------------+---------+-----------+----------+--------------+ FV Mid   Full                                                        +---------+---------------+---------+-----------+----------+--------------+  FV DistalFull                                                        +---------+---------------+---------+-----------+----------+--------------+ PFV      Full                                                        +---------+---------------+---------+-----------+----------+--------------+ POP      Full           Yes      Yes                                 +---------+---------------+---------+-----------+----------+--------------+ PTV      Full                                                        +---------+---------------+---------+-----------+----------+--------------+ PERO     Full                                                        +---------+---------------+---------+-----------+----------+--------------+     Summary: RIGHT: - There is no evidence of deep vein thrombosis in the lower extremity.  - No cystic structure found in the popliteal fossa.  LEFT: - There is no evidence of deep vein thrombosis in the lower extremity.  - No cystic structure found in the popliteal fossa.  *See table(s) above for measurements and observations. Electronically signed by Waverly Ferrari MD on 11/03/2022 at 2:37:23 PM.    Final     ASSESSMENT & PLAN ONITA CROUCHER is a 57 y.o. female with medical history significant for newly diagnosed multiple myeloma with plasmacytoma who presents for a follow up visit.  # Multiple Myeloma with Plasmactyoma --Confirmed with L5 bone lesion biopsy and bone marrow biopsy. --Started VRD therapy on 05/05/2022.  --Patient completed her consultation with [facility] BMT  on 08/13/2022.  She notes that she would prefer to pursue maintenance therapy over bone marrow transplant. --After 8 cycles, transitioned to maintenance Revlimid 10 mg PO daily 21 days on, 7 days off on 10/23/2022.  PLAN: --Currently on maintenance therapy with Revlimid 10 mg p.o. daily 21 days on and 7 days off. --Labs today reviewed and adequate for treatment.  Labs show white blood cell count 5.1, hemoglobin 11.7, MCV 86.3, and platelets of 251. Creatinine and calcium levels are normal.  --Last myeloma labs from 11/03/2022 showed M protein was undetectable with normal serum free light chains.  --Continue with Revlimid therapy without any dose modifications.  --RTC in 4 weeks with repeat labs   #B/L lower extremity edema: --Doppler US obtained today ruled out  --Continue on lasix therapy managed by PCP  #Normocytic anemia: --Etiologies include Revlimid therapy or iron deficiency  --Hgb has been stable between 10-11  g/dL --received 1 dose of Feraheme on 11/24/2022.   # Jitteriness/Palpitations: --Likely secondary to  abnormal thyroid function (hyperthyroidism), symptoms improving with thyroid management.   # Right lower extremity neuropathy: --Greatly improved --Monitor for now.   # Cancer related pain: --Secondary to focal tumor replacement of the right-side of the L5 vertebral body extending into the right L5 pedicle and posterior elements with a small extraosseous component extending into the right L5 neural foramen with moderate-severe stenosi -- Unable to tolerate opoid medications -- Under the care of pallative care. Medication includes lidocaine pain patch and tylenol with mild relief. --Patient is not interested in palliative radiation at this time.   #Supportive Care -- chemotherapy education completed -- port placement not required.  -- zofran 8mg  q8H PRN and compazine 10mg  PO q6H for nausea -- acyclovir 400mg  PO BID for VCZ prophylaxis -- Pain medication as noted  above  Orders Placed This Encounter  Procedures   Ferritin    Standing Status:   Future    Number of Occurrences:   1    Standing Expiration Date:   11/30/2023   Iron and Iron Binding Capacity ([facility] only)    Standing Status:   Future    Number of Occurrences:   1    Standing Expiration Date:   11/30/2023   All questions were answered. The patient knows to call the clinic with any problems, questions or concerns.  I have spent a total of 30 minutes minutes of face-to-face and non-face-to-face time, preparing to see the patient, performing a medically appropriate examination, counseling and educating the patient, documenting clinical information in the electronic health record, independently interpreting results and communicating results to the patient, and care coordination.   Ulysees Barns, MD Department of Hematology/Oncology [facility] Phone: [phone] Pager: [phone] Email: [email or web address]  11/30/2022 4:20 PM

## 2022-11-30 NOTE — Progress Notes (Deleted)
Palliative Medicine [facility]  [phone] [phone]) [phone]   Name: [name] Date: 11/30/2022 MRN: [id number]  DOB: 03/02/1966  Patient Care Team: Margaree Mackintosh, MD as PCP - General (Internal Medicine)    INTERVAL HISTORY: Sonya Franklin is a 57 y.o. 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 Palliative ask to see for symptom management and goals of care.   SOCIAL HISTORY:     reports that she has never smoked. She has never used smokeless tobacco. She reports that she does not drink alcohol and does not use drugs.  ADVANCE DIRECTIVES:  None on file  CODE STATUS: Full code  PAST MEDICAL HISTORY: Past Medical History:  Diagnosis Date   Ankylosing spondylitis (HCC)    Anxiety    Arthritis    Breast cancer (HCC)    right breast   Bursitis of hip, right    Cardiac arrhythmia    Carotid stenosis 08/26/2021   GE reflux    Hiatal hernia    Hypertension    Hyperthyroidism    Ischemic bowel disease (HCC)    Multiple myeloma not having achieved remission (HCC)    Prediabetes 08/26/2021   Snoring 03/21/2014   Vitamin D deficiency     ALLERGIES:  is allergic to shellfish allergy, blue dyes (parenteral), and hydrochlorothiazide.  MEDICATIONS:  Current Outpatient Medications  Medication Sig Dispense Refill   acetaminophen (TYLENOL) 500 MG tablet Take 500 mg by mouth every 6 (six) hours as needed for moderate pain.     acyclovir (ZOVIRAX) 400 MG tablet TAKE 1 TABLET BY MOUTH TWICE A DAY 180 tablet 1   ALPRAZolam (XANAX) 0.5 MG tablet Take 1 tablet (0.5 mg total) by mouth 3 (three) times daily as needed for anxiety. 60 tablet 2   amLODipine (NORVASC) 5 MG tablet TAKE 1 TABLET (5 MG TOTAL) BY MOUTH DAILY. 90 tablet 2   aspirin EC 81 MG tablet Take 81 mg by mouth daily. Swallow whole.     celecoxib (CELEBREX) 100 MG capsule Take 1  capsule (100 mg total) by mouth 2 (two) times daily. 30 capsule 0   doxycycline (VIBRA-TABS) 100 MG tablet Take 1 tablet (100 mg total) by mouth 2 (two) times daily. 14 tablet 0   furosemide (LASIX) 20 MG tablet TAKE 1 TABLET BY MOUTH EVERY DAY AS NEEDED 90 tablet 0   hyoscyamine (ANASPAZ) 0.125 MG TBDP disintergrating tablet Place 1 tablet (0.125 mg total) under the tongue every 4 (four) hours as needed for cramping. 30 tablet 0   KLOR-CON M20 20 MEQ tablet TAKE 2 TABLETS BY MOUTH DAILY 180 tablet 1   lenalidomide (REVLIMID) 10 MG capsule Take 1 capsule (10 mg total) by mouth daily. [employer] Auth # [id number]   Date Obtained 11/19/22  Take 1 capsule daily for 21 days then none for 7 days 21 capsule 0   lidocaine (LIDODERM) 5 % Place 1 patch onto the skin daily. Remove & Discard patch within 12 hours or as directed by MD 30 patch 1   metoprolol tartrate (LOPRESSOR) 25 MG tablet ONE HALF TAB BY MOUTH UP TO TWICE DAILY IF NEEDED FOR PALPITATIONS 90 tablet 1   nystatin (MYCOSTATIN) 100000 UNIT/ML suspension Take 5 mLs (500,000 Units total) by mouth 4 (four) times daily. 60 mL 0   olopatadine (PATANOL) 0.1 % ophthalmic solution Place 1 drop into both eyes 2 (two) times daily as needed.  5 mL PRN   pantoprazole (PROTONIX) 40 MG tablet TAKE 1 TABLET BY MOUTH EVERY DAY 90 tablet 3   traMADol (ULTRAM) 50 MG tablet Take 1 tablet (50 mg total) by mouth every 8 (eight) hours as needed for moderate pain. (Patient not taking: Reported on 11/03/2022) 30 tablet 0   triamcinolone cream (KENALOG) 0.1 % Apply 1 Application topically 3 (three) times daily. (Patient taking differently: Apply 1 Application topically 3 (three) times daily. As needed) 30 g 1   No current facility-administered medications for this visit.    VITAL SIGNS: There were no vitals taken for this visit. There were no vitals filed for this visit.  Estimated body mass index is 33.46 kg/m as calculated from the following:   Height as of 10/30/22: 5'  1" (1.549 m).   Weight as of 11/03/22: 177 lb 1.6 oz (80.3 kg).   PERFORMANCE STATUS (ECOG) : 1 - Symptomatic but completely ambulatory   Physical Exam General: NAD Cardiovascular: regular rate and rhythm Pulmonary: normal breathing pattern  Extremities: trace bilateral edema, no joint deformities Skin: no rashes Neurological: AAO x4  IMPRESSION:   Pain Patient endorses ongoing arthritic leg and back pain. Some chest discomfort. She is hopeful for some improvement over time. Is using lidocaine patches and topical creams. We discussed use of Celebrex however she did not start out of concern for medication interactions. She has medication on hand in addition to tramadol. She will soon be on maintenance treatment and at this time may consider use of medication.   We discussed use of continued topical treatments for support.    Patient understands we are available as needed.    PLAN:  Extensive discussion regarding nonpharmacological methods to assist with her pain and discomfort.  Education also provided on use of oral medications.   Open to use of medication for pain that is not opioid classification in the near future.  I will plan to see patient back in 6-8 weeks in collaboration to other oncology appointments.    Patient expressed understanding and was in agreement with this plan. She also understands that She can call the clinic at any time with any questions, concerns, or complaints.   Any controlled substances utilized were prescribed in the context of palliative care. PDMP has been reviewed.    Visit consisted of counseling and education dealing with the complex and emotionally intense issues of symptom management and palliative care in the setting of serious and potentially life-threatening illness.Greater than 50%  of this time was spent counseling and coordinating care related to the above assessment and plan.  [name], AGPCNP-BC  Palliative Medicine  Team/[facility]  *Please note that this is a verbal dictation therefore any spelling or grammatical errors are due to the "Dragon Medical One" system interpretation.

## 2022-12-01 LAB — KAPPA/LAMBDA LIGHT CHAINS
Kappa free light chain: 23 mg/L — ABNORMAL HIGH (ref 3.3–19.4)
Kappa, lambda light chain ratio: 1.46 (ref 0.26–1.65)
Lambda free light chains: 15.7 mg/L (ref 5.7–26.3)

## 2022-12-01 LAB — FERRITIN: Ferritin: 532 ng/mL — ABNORMAL HIGH (ref 11–307)

## 2022-12-02 ENCOUNTER — Telehealth: Payer: Self-pay | Admitting: Hematology and Oncology

## 2022-12-03 ENCOUNTER — Telehealth: Payer: Self-pay | Admitting: *Deleted

## 2022-12-03 NOTE — Telephone Encounter (Signed)
TCT patient regarding recent lab results. No answer but was able to leave detailed vm message on her identified vm. Advised  that her iron levels are excellent and she does not require another IV iron infusion. Additionally her myeloma labs are excellent and her myeloma appears under control. We will plan to see her back as scheduled in 1 months time.  Advised that she can call back with any questions or concerns to [phone]

## 2022-12-03 NOTE — Telephone Encounter (Signed)
-----   Message from Sonya Franklin sent at 12/01/2022  5:01 PM EDT ----- Please let Sonya Franklin know that her iron levels are excellent and she does not require another Franklin iron infusion.  Additionally her myeloma labs are excellent and her myeloma appears under control.  We will plan to see her back as scheduled in 1 months time. ----- Message ----- From: Sonya Franklin, Lab In [place] Sent: 11/30/2022   2:04 PM EDT To: Sonya Standard, MD

## 2022-12-04 LAB — MULTIPLE MYELOMA PANEL, SERUM
Albumin SerPl Elph-Mcnc: 3.9 g/dL (ref 2.9–4.4)
Albumin/Glob SerPl: 1.3 (ref 0.7–1.7)
Alpha 1: 0.3 g/dL (ref 0.0–0.4)
Alpha2 Glob SerPl Elph-Mcnc: 0.7 g/dL (ref 0.4–1.0)
B-Globulin SerPl Elph-Mcnc: 1.1 g/dL (ref 0.7–1.3)
Gamma Glob SerPl Elph-Mcnc: 1.1 g/dL (ref 0.4–1.8)
Globulin, Total: 3.2 g/dL (ref 2.2–3.9)
IgA: 151 mg/dL (ref 87–352)
IgG (Immunoglobin G), Serum: 1257 mg/dL (ref 586–1602)
IgM (Immunoglobulin M), Srm: 28 mg/dL (ref 26–217)
Total Protein ELP: 7.1 g/dL (ref 6.0–8.5)

## 2022-12-10 ENCOUNTER — Other Ambulatory Visit: Payer: Self-pay | Admitting: Internal Medicine

## 2022-12-15 DIAGNOSIS — H2513 Age-related nuclear cataract, bilateral: Secondary | ICD-10-CM | POA: Diagnosis not present

## 2022-12-15 LAB — HM DIABETES EYE EXAM

## 2022-12-16 ENCOUNTER — Encounter: Payer: Self-pay | Admitting: Endocrinology

## 2022-12-16 ENCOUNTER — Ambulatory Visit (INDEPENDENT_AMBULATORY_CARE_PROVIDER_SITE_OTHER): Payer: Medicare Other | Admitting: Endocrinology

## 2022-12-16 VITALS — BP 140/60 | HR 61 | Ht 61.0 in | Wt 175.0 lb

## 2022-12-16 DIAGNOSIS — E0590 Thyrotoxicosis, unspecified without thyrotoxic crisis or storm: Secondary | ICD-10-CM

## 2022-12-16 DIAGNOSIS — E0520 Thyrotoxicosis with toxic multinodular goiter without thyrotoxic crisis or storm: Secondary | ICD-10-CM

## 2022-12-16 DIAGNOSIS — E042 Nontoxic multinodular goiter: Secondary | ICD-10-CM

## 2022-12-16 NOTE — Patient Instructions (Signed)
Plan for radioactive therapy for thyroid.

## 2022-12-16 NOTE — Progress Notes (Signed)
Outpatient Endocrinology Note [place] Saida Lonon, MD  12/18/22  Patient's Name: Sonya Franklin    DOB: [birth year]    MRN: [id number]  REASON OF VISIT: Follow-up for thyroid nodules   PCP: Margaree Mackintosh, MD  HISTORY OF PRESENT ILLNESS:   Sonya Franklin is a 57 y.o. old female with past medical history as listed below is presented for a follow up of thyroid nodules.  Pertinent Thyroid History:  Patient is known to have multiple bilateral thyroid nodules from 2006.  Patient has intermittently mildly low TSH with normal free T4 and free T3 consistent with subclinical hyperthyroidism at least from 2010. At that time she had symptoms of shakiness and palpitations.  She had intermittently low TSH over the years.  Patient was seen in endocrinology clinic in 2020 and May 2024 by Dr. Lucianne Muss.  She had thyroid scan in December 2010 which showed thyroid enlargement especially on the right side and heterogenous uptake and scattered area of increased and decreased uptake along with 24-hour uptake of 34.7%.   Ultrasound thyroid in April 2024 : Borderline enlarged multinodular thyroid consistent with goiter.  Bilateral thyroid nodules left inferior thyroid nodule solid, hypoechoic measuring 1.2 cm and right inferior thyroid nodule solid, hypoechoic, with echogenic foci measuring 2.0 cm, it was previously measured 2.7 cm in 2006.  Patient had ultrasound thyroid in November 2006 with fine-needle aspiration in June 2007 of right thyroid nodule with benign cytology /most consistent with a benign colloid nodule.  Family history of goiter and autoimmune thyroid disease.  She had negative thyrotropin receptor antibody.  Patient was diagnosed with multiple myeloma and currently on chemotherapy following with oncology.  Recent thyroid lab:   Latest Reference Range & Units 08/13/22 09:28 11/03/22 10:41  TSH 0.350 - 4.500 uIU/mL 0.04 (L) 0.012 (L)  Triiodothyronine,Free,Serum 2.0 - 4.4 pg/mL 3.3 4.0  T4,Free(Direct)  0.61 - 1.12 ng/dL 4.09 8.11  (L): Data is abnormally low  Interval history 12/18/22 Patient continues to have subclinical hyperthyroidism.  She underwent radioactive iodine uptake and scan on October 27, 2022,  consistent with toxic multinodular goiter, no cold nodules.  For uptake 18% and 24-hour of uptake 37% elevated.Pyramidal lobe is evident.  Patient has complaints of occasional palpitation, leg swelling and itchy eyes.  No neck discomfort, dysphagia or any compressive symptoms.  Patient was previously evaluated by Dr. Lucianne Muss and plan for RAI therapy which has not been a schedule yet.  EXAM: THYROID SCAN AND UPTAKE - 4 AND 24 HOURS on 10/27/2022   TECHNIQUE: Following oral administration of I-123 capsule, anterior planar imaging was acquired at 24 hours. Thyroid uptake was calculated with a thyroid probe at 4-6 hours and 24 hours.   RADIOPHARMACEUTICALS:  Four hundred one uCi I-123 sodium iodide p.o.   COMPARISON:  Thyroid ultrasound 06/24/2022   FINDINGS: Mild nodularity of uptake within the thyroid gland. No cold nodules. Pyramidal lobe is evident.   4 hour I-123 uptake = 18.1% (normal 5-20%)   24 hour I-123 uptake = 36.7% (normal 10-30%)   IMPRESSION: Imaging findings and iodine uptake are suggestive of toxic multinodular goiter.     REVIEW OF SYSTEMS:  As per history of present illness.   PAST MEDICAL HISTORY: Past Medical History:  Diagnosis Date   Ankylosing spondylitis (HCC)    Anxiety    Arthritis    Breast cancer (HCC)    right breast   Bursitis of hip, right    Cardiac arrhythmia    Carotid stenosis 08/26/2021  GE reflux    Hiatal hernia    Hypertension    Hyperthyroidism    Ischemic bowel disease (HCC)    Multiple myeloma not having achieved remission (HCC)    Prediabetes 08/26/2021   Snoring 03/21/2014   Vitamin D deficiency     PAST SURGICAL HISTORY: Past Surgical History:  Procedure Laterality Date   ABDOMINAL HYSTERECTOMY     BREAST  LUMPECTOMY     right   MASTECTOMY     double    ALLERGIES: Allergies  Allergen Reactions   Shellfish Allergy Anaphylaxis   Blue Dyes (Parenteral)    Hydrochlorothiazide Itching    FAMILY HISTORY:  Family History  Problem Relation Age of Onset   Thyroid disease Mother    Diabetes Mother    Hypertension Mother    Hyperlipidemia Mother    High blood pressure Father    High Cholesterol Father    Colitis Brother    Goiter Maternal Grandmother    Breast cancer Maternal Aunt     SOCIAL HISTORY: Social History   Socioeconomic History   Marital status: Divorced    Spouse name: Not on file   Number of children: 1   Years of education: Not on file   Highest education level: Not on file  Occupational History    Employer: UNEMPLOYED  Tobacco Use   Smoking status: Never   Smokeless tobacco: Never  Vaping Use   Vaping status: Never Used  Substance and Sexual Activity   Alcohol use: No   Drug use: No   Sexual activity: Yes  Other Topics Concern   Not on file  Social History Narrative   Caffeine occasional drinker.   Not working/disabled.  12th grader. Divorced, one kids.       Social history: She receives disability benefits for history of breast cancer 2007 and ankylosing spondylitis.  She resides with special needs son.  She is divorced.  Non-smoker.  No alcohol consumption.       Family history: Father with history of prostate cancer.  Patient's mother was diagnosed with breast cancer at age 58.  Maternal aunt with history of breast cancer.  3 brothers.   Right handed   Caffeine occas   One level stays on this level       Social Determinants of Health   Financial Resource Strain: Low Risk  (08/26/2021)   Overall Financial Resource Strain (CARDIA)    Difficulty of Paying Living Expenses: Not hard at all  Food Insecurity: No Food Insecurity (05/25/2022)   Hunger Vital Sign    Worried About Running Out of Food in the Last Year: Never true    Ran Out of Food in the  Last Year: Never true  Transportation Needs: No Transportation Needs (08/26/2021)   PRAPARE - Administrator, Civil Service (Medical): No    Lack of Transportation (Non-Medical): No  Physical Activity: Inactive (08/26/2021)   Exercise Vital Sign    Days of Exercise per Week: 0 days    Minutes of Exercise per Session: 0 min  Stress: Not on file  Social Connections: Not on file    MEDICATIONS:  Current Outpatient Medications  Medication Sig Dispense Refill   acetaminophen (TYLENOL) 500 MG tablet Take 500 mg by mouth every 6 (six) hours as needed for moderate pain.     amLODipine (NORVASC) 5 MG tablet TAKE 1 TABLET (5 MG TOTAL) BY MOUTH DAILY. 90 tablet 2   aspirin EC 81 MG tablet Take 81 mg  by mouth daily. Swallow whole.     furosemide (LASIX) 20 MG tablet TAKE 1 TABLET BY MOUTH EVERY DAY AS NEEDED 90 tablet 0   hyoscyamine (ANASPAZ) 0.125 MG TBDP disintergrating tablet Place 1 tablet (0.125 mg total) under the tongue every 4 (four) hours as needed for cramping. 30 tablet 0   KLOR-CON M20 20 MEQ tablet TAKE 2 TABLETS BY MOUTH DAILY 180 tablet 1   lenalidomide (REVLIMID) 10 MG capsule Take 1 capsule (10 mg total) by mouth daily. [employer] Auth # [id number]   Date Obtained 11/19/22  Take 1 capsule daily for 21 days then none for 7 days 21 capsule 0   lidocaine (LIDODERM) 5 % Place 1 patch onto the skin daily. Remove & Discard patch within 12 hours or as directed by MD 30 patch 1   metoprolol tartrate (LOPRESSOR) 25 MG tablet ONE HALF TAB BY MOUTH UP TO TWICE DAILY IF NEEDED FOR PALPITATIONS 90 tablet 1   olopatadine (PATANOL) 0.1 % ophthalmic solution Place 1 drop into both eyes 2 (two) times daily as needed. 5 mL PRN   pantoprazole (PROTONIX) 40 MG tablet TAKE 1 TABLET BY MOUTH EVERY DAY 90 tablet 3   triamcinolone cream (KENALOG) 0.1 % Apply 1 Application topically 3 (three) times daily. (Patient taking differently: Apply 1 Application topically 3 (three) times daily. As needed) 30 g 1    acyclovir (ZOVIRAX) 400 MG tablet TAKE 1 TABLET BY MOUTH TWICE A DAY (Patient not taking: Reported on 12/16/2022) 180 tablet 1   ALPRAZolam (XANAX) 0.5 MG tablet Take 1 tablet (0.5 mg total) by mouth 3 (three) times daily as needed for anxiety. (Patient not taking: Reported on 12/16/2022) 60 tablet 2   celecoxib (CELEBREX) 100 MG capsule Take 1 capsule (100 mg total) by mouth 2 (two) times daily. (Patient not taking: Reported on 12/16/2022) 30 capsule 0   doxycycline (VIBRA-TABS) 100 MG tablet Take 1 tablet (100 mg total) by mouth 2 (two) times daily. (Patient not taking: Reported on 12/16/2022) 14 tablet 0   nystatin (MYCOSTATIN) 100000 UNIT/ML suspension Take 5 mLs (500,000 Units total) by mouth 4 (four) times daily. (Patient not taking: Reported on 12/16/2022) 60 mL 0   traMADol (ULTRAM) 50 MG tablet Take 1 tablet (50 mg total) by mouth every 8 (eight) hours as needed for moderate pain. (Patient not taking: Reported on 11/03/2022) 30 tablet 0   No current facility-administered medications for this visit.    PHYSICAL EXAM: Vitals:   12/16/22 1354  BP: (!) 140/60  Pulse: 61  SpO2: 98%  Weight: 175 lb (79.4 kg)  Height: 5\' 1"  (1.549 m)   Body mass index is 33.07 kg/m.    General: Well developed, well nourished female in no apparent distress.  HEENT: AT/[place], no external lesions. Hearing intact to the spoken word Eyes: EOMI. No stare, proptosis. Conjunctiva clear and no icterus. Neck: Trachea midline, neck supple without appreciable thyromegaly or lymphadenopathy and no palpable thyroid nodule Lungs: Clear to auscultation, no wheeze. Respirations not labored Heart: S1S2, Regular in rate and rhythm. Abdomen: Soft, non tender Neurologic: Alert, oriented, normal speech, deep tendon biceps reflexes normal,  no gross focal neurological deficit Extremities: No pedal pitting edema, no tremors of outstretched hands Skin: Warm, color good.  Psychiatric: Does not appear depressed or  anxious  PERTINENT HISTORIC LABORATORY AND IMAGING STUDIES:  All pertinent laboratory results were reviewed. Please see HPI also for further details.   TSH  Date Value Ref Range Status  11/03/2022 0.012 (  L) 0.350 - 4.500 uIU/mL Final    Comment:    Performed by a 3rd Generation assay with a functional sensitivity of <=0.01 uIU/mL. Performed at [facility], [address], [place], [place] [address]   08/13/2022 0.04 (L) 0.35 - 5.50 uIU/mL Final  05/25/2022 0.01 (L) 0.40 - 4.50 mIU/L Final    Latest Reference Range & Units 08/13/22 09:28  Thyrotropin Receptor Ab 0.00 - 1.75 IU/L <1.10    CLINICAL DATA:  Hypothyroid.  hypothyroidism; history of goiter   EXAM: THYROID ULTRASOUND in 06/24/2022   TECHNIQUE: Ultrasound examination of the thyroid gland and adjacent soft tissues was performed.   COMPARISON:  US Thyroid, 01/20/2005. US Thyroid biopsy, 02/09/2005 - RIGHT inferior nodule. CT chest, 01/12/2022.   FINDINGS: Parenchymal Echotexture: Mildly heterogenous   Isthmus: 0.7 cm   Right lobe: 5.8 x 2.1 x 2.1 cm   Left lobe: 4.9 x 1.8 x 1.3 cm   _________________________________________________________   Estimated total number of nodules >/= 1 cm: 3   Number of spongiform nodules >/=  2 cm not described below (TR1): 0   Number of mixed cystic and solid nodules >/= 1.5 cm not described below (TR2): 0   _________________________________________________________   Nodule # 2:   Location: RIGHT; Inferior   Maximum size: 2.0 cm; Other 2 dimensions: 1.7 x 1.4 cm, previously 2.7 x 2.5 x 1.8 cm   Composition: solid/almost completely solid (2)   Echogenicity: hypoechoic (2)   Shape: not taller-than-wide (0)   Margins: ill-defined (0)   Echogenic foci: macrocalcifications (1)   ACR TI-RADS total points: 5.   ACR TI-RADS risk category: TR4 (4-6 points).   This nodule has demonstrated interval involution. It was previously biopsied in  02/09/2005.   Assuming a benign pathologic diagnosis, repeat sampling and/or dedicated follow-up is not recommended.   _________________________________________________________   Nodule # 3:   Location: LEFT; Inferior   Maximum size: 1.2 cm; Other 2 dimensions: 0.9 x 0.9 cm   Composition: solid/almost completely solid (2)   Echogenicity: hypoechoic (2)   Shape: not taller-than-wide (0)   Margins: ill-defined (0)   Echogenic foci: none (0)   ACR TI-RADS total points: 4.   ACR TI-RADS risk category: TR4 (4-6 points).   ACR TI-RADS recommendations:   *Given size (>/= 1 - 1.4 cm) and appearance, a follow-up ultrasound in 1 year should be considered based on TI-RADS criteria.   _________________________________________________________   Additional nodules, such as labeled # 1 (0.8 cm RIGHT inferior mixed cystic and solid, TR-2) and # 4 (1.0 cm RIGHT inferior cystic TR-1) do not meet threshold for follow-up nor biopsy per current criteria.   No cervical adenopathy or abnormal fluid collection within the imaged neck.   IMPRESSION: 1. Borderline-enlarged, multinodular thyroid consistent with a goiter. 2. 1.2 cm LEFT inferior TR-4 thyroid nodule. A follow-up ultrasound in 1 year should be considered based on TI-RADS criteria. 3. Involution of now 2.0 cm RIGHT inferior thyroid nodule. This was previously biopsied in 02/09/2005. Assuming a benign pathologic diagnosis, repeat sampling and/or dedicated follow-up is not recommended.   The above is in keeping with the ACR TI-RADS recommendations - J Am Coll Radiol 2017;14:587-595.     ASSESSMENT / PLAN  1. 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 Toxic multinodular goiter    -Patient has toxic multinodular goiter.  She had radioactive iodine uptake and scan in August with 4-hour uptake of 15% and 24-hour uptake of 37% consistent with multinodular goiter.  She had  negative thyrotropin receptor antibody, negative for Graves'  disease. -Patient is known to have bilateral multiple thyroid nodules, last ultrasound was in April 2024, right solid hypoechoic thyroid nodule measuring 2.0 cm was slightly smaller than that was measured in ultrasound in 2006, status post FNA of this nodule in 2006 with benign cytology.  She has left thyroid nodule solid hypoechoic measuring 1.2 cm as well.  No cold nodule noted on nuclear scan.  -Patient has intermittent with mildly low TSH and normal free T4 and free T3 subclinical hyperthyroidism for several years at least from 2010.   Plan: -Will plan for RAI I-131 therapy for hypothyroidism.  Order placed.  Patient will complete in [facility].   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  All questions answered and patient verbalized understanding of the plan.  [place] Tahisha Hakim, MD [facility] Endocrinology [facility] [address] [place], [address] [place], [place] [address] Phone # [phone]  At least part of this note was generated using voice recognition software. Inadvertent word errors may have occurred, which were not recognized during the proofreading process.

## 2022-12-18 DIAGNOSIS — E0590 Thyrotoxicosis, unspecified without thyrotoxic crisis or storm: Secondary | ICD-10-CM | POA: Insufficient documentation

## 2022-12-18 DIAGNOSIS — E0520 Thyrotoxicosis with toxic multinodular goiter without thyrotoxic crisis or storm: Secondary | ICD-10-CM | POA: Insufficient documentation

## 2022-12-19 ENCOUNTER — Other Ambulatory Visit: Payer: Self-pay | Admitting: Hematology and Oncology

## 2022-12-19 ENCOUNTER — Other Ambulatory Visit: Payer: Self-pay | Admitting: Internal Medicine

## 2022-12-20 ENCOUNTER — Encounter: Payer: Self-pay | Admitting: Physician Assistant

## 2022-12-20 ENCOUNTER — Encounter: Payer: Self-pay | Admitting: Hematology and Oncology

## 2022-12-21 ENCOUNTER — Other Ambulatory Visit: Payer: Self-pay

## 2022-12-21 MED ORDER — LENALIDOMIDE 10 MG PO CAPS: 10.0000 mg | ORAL_CAPSULE | Freq: Every day | ORAL | 0 refills | Status: DC

## 2022-12-21 NOTE — Written Directive (Incomplete Revision)
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 [place] Thapa   TSH:    Lab Results  Component Value Date   TSH 0.012 (L) 11/03/2022   TSH 0.04 (L) 08/13/2022   TSH 0.01 (L) 05/25/2022     PRIOR I-131 THERAPY (Date and Dose):   PRIOR RADIOLOGY EXAMS (Results and Date): NM THYROID MULT UPTAKE W/IMAGING  Result Date: 11/04/2022 CLINICAL DATA:  Hyperthyroidism. TSH equal 0.04. hyperthyroid symptoms include nervousness and weight loss. Fatigue. Anxious. Heart palpitations. EXAM: THYROID SCAN AND UPTAKE - 4 AND 24 HOURS TECHNIQUE: Following oral administration of I-123 capsule, anterior planar imaging was acquired at 24 hours. Thyroid uptake was calculated with a thyroid probe at 4-6 hours and 24 hours. RADIOPHARMACEUTICALS:  Four hundred one uCi I-123 sodium iodide p.o. COMPARISON:  Thyroid ultrasound 06/24/2022 FINDINGS: Mild nodularity of uptake within the thyroid gland. No cold nodules. Pyramidal lobe is evident. 4 hour I-123 uptake = 18.1% (normal 5-20%) 24 hour I-123 uptake = 36.7% (normal 10-30%) IMPRESSION: Imaging findings and iodine uptake are suggestive of toxic multinodular goiter. Electronically Signed   By: Genevive Bi M.D.   On: 11/04/2022 15:27   US THYROID  Result Date: 06/24/2022 CLINICAL DATA:  Hypothyroid.  hypothyroidism; history of goiter EXAM: THYROID ULTRASOUND TECHNIQUE: Ultrasound examination of the thyroid gland and adjacent soft tissues was performed. COMPARISON:   US Thyroid, 01/20/2005. US Thyroid biopsy, 02/09/2005 - RIGHT inferior nodule. CT chest, 01/12/2022. FINDINGS: Parenchymal Echotexture: Mildly heterogenous Isthmus: 0.7 cm Right lobe: 5.8 x 2.1 x 2.1 cm Left lobe: 4.9 x 1.8 x 1.3 cm _________________________________________________________ Estimated total number of nodules >/= 1 cm: 3 Number of spongiform nodules >/=  2 cm not described below (TR1): 0 Number of mixed cystic and solid nodules >/= 1.5 cm not described below (TR2): 0 _________________________________________________________ Nodule # 2: Location: RIGHT; Inferior Maximum size: 2.0 cm; Other 2 dimensions: 1.7 x 1.4 cm, previously 2.7 x 2.5 x 1.8 cm Composition: solid/almost completely solid (2) Echogenicity: hypoechoic (2) Shape: not taller-than-wide (0) Margins: ill-defined (0) Echogenic foci: macrocalcifications (1) ACR TI-RADS total points: 5. ACR TI-RADS risk category: TR4 (4-6 points). This nodule has demonstrated interval involution. It was previously biopsied in 02/09/2005. Assuming a benign pathologic diagnosis, repeat sampling and/or dedicated follow-up is not recommended. _________________________________________________________ Nodule # 3: Location: LEFT; Inferior Maximum size: 1.2 cm; Other 2 dimensions: 0.9 x 0.9 cm Composition: solid/almost completely solid (2) Echogenicity: hypoechoic (2) Shape: not taller-than-wide (0) Margins: ill-defined (0) Echogenic foci: none (0) ACR TI-RADS total points: 4. ACR TI-RADS risk category: TR4 (4-6 points). ACR TI-RADS recommendations: *Given size (>/= 1 - 1.4 cm) and appearance, a follow-up ultrasound in 1 year should be considered based on TI-RADS criteria. _________________________________________________________ Additional nodules, such as labeled # 1 (0.8 cm RIGHT inferior mixed cystic and solid, TR-2) and # 4 (1.0 cm RIGHT inferior cystic TR-1) do not meet threshold for follow-up nor biopsy  per current criteria. No cervical adenopathy or abnormal  fluid collection within the imaged neck. IMPRESSION: 1. Borderline-enlarged, multinodular thyroid consistent with a goiter. 2. 1.2 cm LEFT inferior TR-4 thyroid nodule. A follow-up ultrasound in 1 year should be considered based on TI-RADS criteria. 3. Involution of now 2.0 cm RIGHT inferior thyroid nodule. This was previously biopsied in 02/09/2005. Assuming a benign pathologic diagnosis, repeat sampling and/or dedicated follow-up is not recommended. The above is in keeping with the ACR TI-RADS recommendations - [employer] Am Coll Radiol 2017;14:587-595. Electronically Signed   By: Roanna Banning M.D.   On: 06/24/2022 16:30      ADDITIONAL PHYSICIAN COMMENTS/NOTES  Toxic multinodular goiter AUTHORIZED USER SIGNATURE & TIME STAMP:

## 2022-12-21 NOTE — Written Directive (Deleted)
 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 [place] Thapa   TSH:    Lab Results  Component Value Date   TSH 0.012 (L) 11/03/2022   TSH 0.04 (L) 08/13/2022   TSH 0.01 (L) 05/25/2022     PRIOR I-131 THERAPY (Date and Dose):   PRIOR RADIOLOGY EXAMS (Results and Date): NM THYROID MULT UPTAKE W/IMAGING  Result Date: 11/04/2022 CLINICAL DATA:  Hyperthyroidism. TSH equal 0.04. hyperthyroid symptoms include nervousness and weight loss. Fatigue. Anxious. Heart palpitations. EXAM: THYROID SCAN AND UPTAKE - 4 AND 24 HOURS TECHNIQUE: Following oral administration of I-123 capsule, anterior planar imaging was acquired at 24 hours. Thyroid uptake was calculated with a thyroid probe at 4-6 hours and 24 hours. RADIOPHARMACEUTICALS:  Four hundred one uCi I-123 sodium iodide p.o. COMPARISON:  Thyroid ultrasound 06/24/2022 FINDINGS: Mild nodularity of uptake within the thyroid gland. No cold nodules. Pyramidal lobe is evident. 4 hour I-123 uptake = 18.1% (normal 5-20%) 24 hour I-123 uptake = 36.7% (normal 10-30%) IMPRESSION: Imaging findings and iodine uptake are suggestive of toxic multinodular goiter. Electronically Signed   By: Genevive Bi M.D.   On: 11/04/2022 15:27   US THYROID  Result Date: 06/24/2022 CLINICAL DATA:  Hypothyroid.  hypothyroidism; history of goiter EXAM: THYROID ULTRASOUND TECHNIQUE: Ultrasound examination of the thyroid gland and adjacent soft tissues was performed. COMPARISON:  US  Thyroid, 01/20/2005. US Thyroid biopsy, 02/09/2005 - RIGHT inferior nodule. CT chest, 01/12/2022. FINDINGS: Parenchymal Echotexture: Mildly heterogenous Isthmus: 0.7 cm Right lobe: 5.8 x 2.1 x 2.1 cm Left lobe: 4.9 x 1.8 x 1.3 cm _________________________________________________________ Estimated total number of nodules >/= 1 cm: 3 Number of spongiform nodules >/=  2 cm not described below (TR1): 0 Number of mixed cystic and solid nodules >/= 1.5 cm not described below (TR2): 0 _________________________________________________________ Nodule # 2: Location: RIGHT; Inferior Maximum size: 2.0 cm; Other 2 dimensions: 1.7 x 1.4 cm, previously 2.7 x 2.5 x 1.8 cm Composition: solid/almost completely solid (2) Echogenicity: hypoechoic (2) Shape: not taller-than-wide (0) Margins: ill-defined (0) Echogenic foci: macrocalcifications (1) ACR TI-RADS total points: 5. ACR TI-RADS risk category: TR4 (4-6 points). This nodule has demonstrated interval involution. It was previously biopsied in 02/09/2005. Assuming a benign pathologic diagnosis, repeat sampling and/or dedicated follow-up is not recommended. _________________________________________________________ Nodule # 3: Location: LEFT; Inferior Maximum size: 1.2 cm; Other 2 dimensions: 0.9 x 0.9 cm Composition: solid/almost completely solid (2) Echogenicity: hypoechoic (2) Shape: not taller-than-wide (0) Margins: ill-defined (0) Echogenic foci: none (0) ACR TI-RADS total points: 4. ACR TI-RADS risk category: TR4 (4-6 points). ACR TI-RADS recommendations: *Given size (>/= 1 - 1.4 cm) and appearance, a follow-up ultrasound in 1 year should be considered based on TI-RADS criteria. _________________________________________________________ Additional nodules, such as labeled # 1 (0.8 cm RIGHT inferior mixed cystic and solid, TR-2) and # 4 (1.0 cm RIGHT inferior cystic TR-1) do not meet threshold for follow-up nor biopsy per  current criteria. No cervical adenopathy or abnormal fluid  collection within the imaged neck. IMPRESSION: 1. Borderline-enlarged, multinodular thyroid consistent with a goiter. 2. 1.2 cm LEFT inferior TR-4 thyroid nodule. A follow-up ultrasound in 1 year should be considered based on TI-RADS criteria. 3. Involution of now 2.0 cm RIGHT inferior thyroid nodule. This was previously biopsied in 02/09/2005. Assuming a benign pathologic diagnosis, repeat sampling and/or dedicated follow-up is not recommended. The above is in keeping with the ACR TI-RADS recommendations -  Am Coll Radiol 2017;14:587-595. Electronically Signed   By: Roanna Banning M.D.   On: 06/24/2022 16:30      ADDITIONAL PHYSICIAN COMMENTS/NOTES   AUTHORIZED USER SIGNATURE & TIME STAMP:

## 2022-12-22 ENCOUNTER — Telehealth: Payer: Self-pay | Admitting: Internal Medicine

## 2022-12-22 NOTE — Telephone Encounter (Signed)
Sonya Franklin [phone]  Alekhya called to say she wants to come in and talk to you about getting her chronic pain shots here. She stated that since Dr Lucianne Muss has left the new doctor is not under her insurance and I got the impression that oncology did not want to do the shots.

## 2022-12-22 NOTE — Written Directive (Cosign Needed)
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 [place] Thapa    TSH:    Lab Results  Component Value Date   TSH 0.012 (L) 11/03/2022   TSH 0.04 (L) 08/13/2022   TSH 0.01 (L) 05/25/2022     PRIOR I-131 THERAPY (Date and Dose):   PRIOR RADIOLOGY EXAMS (Results and Date): NM THYROID MULT UPTAKE W/IMAGING  Result Date: 11/04/2022 CLINICAL DATA:  Hyperthyroidism. TSH equal 0.04. hyperthyroid symptoms include nervousness and weight loss. Fatigue. Anxious. Heart palpitations. EXAM: THYROID SCAN AND UPTAKE - 4 AND 24 HOURS TECHNIQUE: Following oral administration of I-123 capsule, anterior planar imaging was acquired at 24 hours. Thyroid uptake was calculated with a thyroid probe at 4-6 hours and 24 hours. RADIOPHARMACEUTICALS:  Four hundred one uCi I-123 sodium iodide p.o. COMPARISON:  Thyroid ultrasound 06/24/2022 FINDINGS: Mild nodularity of uptake within the thyroid gland. No cold nodules. Pyramidal lobe is evident. 4 hour I-123 uptake = 18.1% (normal 5-20%) 24 hour I-123 uptake = 36.7% (normal 10-30%) IMPRESSION: Imaging findings and iodine uptake are suggestive of toxic multinodular goiter. Electronically Signed   By: Genevive Bi M.D.   On: 11/04/2022 15:27   US THYROID  Result Date: 06/24/2022 CLINICAL DATA:  Hypothyroid.  hypothyroidism; history of goiter EXAM: THYROID ULTRASOUND TECHNIQUE: Ultrasound examination of the thyroid gland and adjacent soft tissues was performed.  COMPARISON:  US Thyroid, 01/20/2005. US Thyroid biopsy, 02/09/2005 - RIGHT inferior nodule. CT chest, 01/12/2022. FINDINGS: Parenchymal Echotexture: Mildly heterogenous Isthmus: 0.7 cm Right lobe: 5.8 x 2.1 x 2.1 cm Left lobe: 4.9 x 1.8 x 1.3 cm _________________________________________________________ Estimated total number of nodules >/= 1 cm: 3 Number of spongiform nodules >/=  2 cm not described below (TR1): 0 Number of mixed cystic and solid nodules >/= 1.5 cm not described below (TR2): 0 _________________________________________________________ Nodule # 2: Location: RIGHT; Inferior Maximum size: 2.0 cm; Other 2 dimensions: 1.7 x 1.4 cm, previously 2.7 x 2.5 x 1.8 cm Composition: solid/almost completely solid (2) Echogenicity: hypoechoic (2) Shape: not taller-than-wide (0) Margins: ill-defined (0) Echogenic foci: macrocalcifications (1) ACR TI-RADS total points: 5. ACR TI-RADS risk category: TR4 (4-6 points). This nodule has demonstrated interval involution. It was previously biopsied in 02/09/2005. Assuming a benign pathologic diagnosis, repeat sampling and/or dedicated follow-up is not recommended. _________________________________________________________ Nodule # 3: Location: LEFT; Inferior Maximum size: 1.2 cm; Other 2 dimensions: 0.9 x 0.9 cm Composition: solid/almost completely solid (2) Echogenicity: hypoechoic (2) Shape: not taller-than-wide (0) Margins: ill-defined (0) Echogenic foci: none (0) ACR TI-RADS total points: 4. ACR TI-RADS risk category: TR4 (4-6 points). ACR TI-RADS recommendations: *Given size (>/= 1 - 1.4 cm) and appearance, a follow-up ultrasound in 1 year should be considered based on TI-RADS criteria. _________________________________________________________ Additional nodules, such as labeled # 1 (0.8 cm RIGHT inferior mixed cystic and solid, TR-2) and # 4 (1.0 cm RIGHT inferior cystic TR-1) do not meet  threshold for follow-up nor biopsy per current criteria. No cervical adenopathy or  abnormal fluid collection within the imaged neck. IMPRESSION: 1. Borderline-enlarged, multinodular thyroid consistent with a goiter. 2. 1.2 cm LEFT inferior TR-4 thyroid nodule. A follow-up ultrasound in 1 year should be considered based on TI-RADS criteria. 3. Involution of now 2.0 cm RIGHT inferior thyroid nodule. This was previously biopsied in 02/09/2005. Assuming a benign pathologic diagnosis, repeat sampling and/or dedicated follow-up is not recommended. The above is in keeping with the ACR TI-RADS recommendations - J Am Coll Radiol 2017;14:587-595. Electronically Signed   By: Roanna Banning M.D.   On: 06/24/2022 16:30      ADDITIONAL PHYSICIAN COMMENTS/NOTES   AUTHORIZED USER SIGNATURE & TIME STAMP:

## 2022-12-22 NOTE — Telephone Encounter (Signed)
I have LVM for Catha to CB and schedule an appointment. I also called and verified with Shanee's insurance Dr [place] Thapa, is in network. Call reference # [id number]

## 2022-12-22 NOTE — Telephone Encounter (Signed)
Scheduled appointment

## 2022-12-24 ENCOUNTER — Encounter: Payer: Self-pay | Admitting: Internal Medicine

## 2022-12-24 ENCOUNTER — Ambulatory Visit (INDEPENDENT_AMBULATORY_CARE_PROVIDER_SITE_OTHER): Payer: Medicare Other | Admitting: Internal Medicine

## 2022-12-24 VITALS — BP 160/80 | HR 52 | Ht 61.0 in | Wt 176.0 lb

## 2022-12-24 DIAGNOSIS — M7918 Myalgia, other site: Secondary | ICD-10-CM | POA: Diagnosis not present

## 2022-12-24 DIAGNOSIS — R600 Localized edema: Secondary | ICD-10-CM

## 2022-12-24 MED ORDER — METHYLPREDNISOLONE ACETATE 80 MG/ML IJ SUSP
80.0000 mg | Freq: Once | INTRAMUSCULAR | Status: AC
Start: 2022-12-24 — End: 2022-12-24
  Administered 2022-12-24: 80 mg via INTRAMUSCULAR

## 2022-12-24 MED ORDER — HYDROCODONE-ACETAMINOPHEN 10-325 MG PO TABS
ORAL_TABLET | ORAL | 0 refills | Status: DC
Start: 2022-12-24 — End: 2023-10-13

## 2022-12-24 NOTE — Progress Notes (Signed)
Patient Care Team: Margaree Mackintosh, MD as PCP - General (Internal Medicine)  Visit Date: 12/24/22  Subjective:    Patient ID: Sonya Franklin , Female   DOB: Aug 20, 1965, 57 y.o.    MRN: [id number]   57 y.o. Female presents today for myalgias. She is no longer getting steroidal injections, NSAID's. Taking Tylenol without pain relief. Walking has been difficult.  History of multiple myeloma and currently on maintenance therapy with Revlimid 10 mg. Followed by oncologist, Dr. Leonides Schanz.  She has been having lower leg swelling bilaterally. Taking furosemide 20 mg daily.  History of thyrotoxicosis, toxic multinodular goiter. Followed by endocrinologist, Dr. [place] Thapa. 06/24/22 thyroid ultrasound showed: 1) Borderline-enlarged, multinodular thyroid consistent with a goiter, 2) 1.2 cm LEFT inferior TR-4 thyroid nodule. A follow-up ultrasound in 1 year should be considered based on TI-RADS criteria, 3) Involution of now 2.0 cm RIGHT inferior thyroid nodule. This was previously biopsied in 02/09/2005. Her first radiation treatment is upcoming.  History of palpitations treated with metoprolol tartrate 12.5 mg up to twice daily as needed for palpitations. Her blood pressure is up in the office today at 170/100.  Past Medical History:  Diagnosis Date   Ankylosing spondylitis (HCC)    Anxiety    Arthritis    Breast cancer (HCC)    right breast   Bursitis of hip, right    Cardiac arrhythmia    Carotid stenosis 08/26/2021   GE reflux    Hiatal hernia    Hypertension    Hyperthyroidism    Ischemic bowel disease (HCC)    Multiple myeloma not having achieved remission (HCC)    Prediabetes 08/26/2021   Snoring 03/21/2014   Vitamin D deficiency      Family History  Problem Relation Age of Onset   Thyroid disease Mother    Diabetes Mother    Hypertension Mother    Hyperlipidemia Mother    High blood pressure Father    High Cholesterol Father    Colitis Brother    Goiter Maternal  Grandmother    Breast cancer Maternal Aunt     Social History   Social History Narrative   Caffeine occasional drinker.   Not working/disabled.  12th grader. Divorced, one kids.       Social history: She receives disability benefits for history of breast cancer 2007 and ankylosing spondylitis.  She resides with special needs son.  She is divorced.  Non-smoker.  No alcohol consumption.       Family history: Father with history of prostate cancer.  Patient's mother was diagnosed with breast cancer at age 36.  Maternal aunt with history of breast cancer.  3 brothers.   Right handed   Caffeine occas   One level stays on this level          Review of Systems  Constitutional:  Negative for fever and malaise/fatigue.  HENT:  Negative for congestion.   Eyes:  Negative for blurred vision.  Respiratory:  Negative for cough and shortness of breath.   Cardiovascular:  Negative for chest pain, palpitations and leg swelling.  Gastrointestinal:  Negative for vomiting.  Musculoskeletal:  Positive for myalgias. Negative for back pain.  Skin:  Negative for rash.  Neurological:  Negative for loss of consciousness and headaches.        Objective:   Vitals: BP (!) 170/100   Pulse (!) 52   Ht 5\' 1"  (1.549 m)   Wt 176 lb (79.8 kg)   SpO2 98%  BMI 33.25 kg/m    Physical Exam Vitals and nursing note reviewed.  Constitutional:      General: She is not in acute distress.    Appearance: Normal appearance. She is not toxic-appearing.  HENT:     Head: Normocephalic and atraumatic.  Pulmonary:     Effort: Pulmonary effort is normal.  Musculoskeletal:     Right lower leg: 2+ Edema present.     Left lower leg: 2+ Edema present.  Skin:    General: Skin is warm and dry.     Comments: Lower legs slightly red and swollen.  Neurological:     Mental Status: She is alert and oriented to person, place, and time. Mental status is at baseline.  Psychiatric:        Mood and Affect: Mood normal.         Behavior: Behavior normal.        Thought Content: Thought content normal.        Judgment: Judgment normal.       Results:   Studies obtained and personally reviewed by me:  06/24/22 thyroid ultrasound showed: 1) Borderline-enlarged, multinodular thyroid consistent with a goiter, 2) 1.2 cm LEFT inferior TR-4 thyroid nodule. A follow-up ultrasound in 1 year should be considered based on TI-RADS criteria, 3) Involution of now 2.0 cm RIGHT inferior thyroid nodule. This was previously biopsied in 02/09/2005.  Labs:       Component Value Date/Time   NA 141 11/30/2022 1338   K 3.3 (L) 11/30/2022 1338   CL 108 11/30/2022 1338   CO2 26 11/30/2022 1338   GLUCOSE 101 (H) 11/30/2022 1338   BUN 11 11/30/2022 1338   CREATININE 0.68 11/30/2022 1338   CREATININE 0.75 10/30/2022 1200   CALCIUM 9.5 11/30/2022 1338   PROT 7.6 11/30/2022 1338   ALBUMIN 4.3 11/30/2022 1338   AST 18 11/30/2022 1338   ALT 18 11/30/2022 1338   ALKPHOS 82 11/30/2022 1338   BILITOT 0.6 11/30/2022 1338   GFRNONAA >60 11/30/2022 1338   GFRNONAA 108 04/05/2020 1128   GFRAA 126 04/05/2020 1128     Lab Results  Component Value Date   WBC 5.1 11/30/2022   HGB 11.7 (L) 11/30/2022   HCT 35.9 (L) 11/30/2022   MCV 86.3 11/30/2022   PLT 251 11/30/2022    Lab Results  Component Value Date   CHOL 181 07/17/2021   HDL 64 07/17/2021   LDLCALC 100 (H) 07/17/2021   TRIG 76 07/17/2021   CHOLHDL 2.8 07/17/2021    Lab Results  Component Value Date   HGBA1C 5.8 (H) 10/19/2022     Lab Results  Component Value Date   TSH 0.012 (L) 11/03/2022      Assessment & Plan:   Myalgias: prescribed Norco 10-325 mg one half to one tab with food every 8-12 hours as needed for pain.Can only prescribe 5 day supply. May need to go to Pain Clinic.  Multiple myeloma: currently on maintenance therapy with Revlimid 10 mg. Followed by oncologist, Dr. Leonides Schanz.  Lower leg edema: treated with furosemide 20 mg  daily.  Thyrotoxicosis / toxic multinodular goiter: followed by Endocrinologist, Dr. [place] Thapa. Her first radiation treatment is upcoming.  Palpitations: instructed on taking metoprolol tartrate 12.5 mg up to twice daily as needed for palpitations. Her blood pressure is up in the office today at 170/100.  Remote hx of Breast cancer  Hx of fibromyalgia symptoms/musculoskeletal pain  I,Alexander Ruley,acting as a scribe for Margaree Mackintosh, MD.,have documented  all relevant documentation on the behalf of Margaree Mackintosh, MD,as directed by  Margaree Mackintosh, MD while in the presence of Margaree Mackintosh, MD.   I, Margaree Mackintosh, MD, have reviewed all documentation for this visit. The documentation on 01/04/23 for the exam, diagnosis, procedures, and orders are all accurate and complete.

## 2022-12-28 ENCOUNTER — Encounter (HOSPITAL_COMMUNITY)
Admission: RE | Admit: 2022-12-28 | Discharge: 2022-12-28 | Disposition: A | Discharge status: Home / Self Care | Payer: Medicare Other | Source: Ambulatory Visit | Attending: Endocrinology | Admitting: Endocrinology

## 2022-12-28 ENCOUNTER — Other Ambulatory Visit: Payer: Self-pay | Admitting: Physician Assistant

## 2022-12-28 DIAGNOSIS — E0590 Thyrotoxicosis, unspecified without thyrotoxic crisis or storm: Secondary | ICD-10-CM | POA: Diagnosis not present

## 2022-12-28 DIAGNOSIS — E0520 Thyrotoxicosis with toxic multinodular goiter without thyrotoxic crisis or storm: Secondary | ICD-10-CM | POA: Insufficient documentation

## 2022-12-28 DIAGNOSIS — C9000 Multiple myeloma not having achieved remission: Secondary | ICD-10-CM

## 2022-12-28 MED ORDER — SODIUM IODIDE I 131 CAPSULE
27.5000 | Freq: Once | INTRAVENOUS | Status: AC | PRN
  Administered 2022-12-28: 27.5 via ORAL

## 2022-12-29 ENCOUNTER — Inpatient Hospital Stay: Payer: Medicare Other | Admitting: Physician Assistant

## 2022-12-29 ENCOUNTER — Telehealth: Payer: Self-pay | Admitting: Hematology and Oncology

## 2022-12-29 ENCOUNTER — Inpatient Hospital Stay: Payer: Medicare Other | Attending: Physician Assistant

## 2022-12-29 DIAGNOSIS — Z7961 Long term (current) use of immunomodulator: Secondary | ICD-10-CM | POA: Insufficient documentation

## 2022-12-29 DIAGNOSIS — I10 Essential (primary) hypertension: Secondary | ICD-10-CM | POA: Insufficient documentation

## 2022-12-29 DIAGNOSIS — Z7982 Long term (current) use of aspirin: Secondary | ICD-10-CM | POA: Insufficient documentation

## 2022-12-29 DIAGNOSIS — Z803 Family history of malignant neoplasm of breast: Secondary | ICD-10-CM | POA: Insufficient documentation

## 2022-12-29 DIAGNOSIS — K219 Gastro-esophageal reflux disease without esophagitis: Secondary | ICD-10-CM | POA: Insufficient documentation

## 2022-12-29 DIAGNOSIS — Z79624 Long term (current) use of inhibitors of nucleotide synthesis: Secondary | ICD-10-CM | POA: Insufficient documentation

## 2022-12-29 DIAGNOSIS — D649 Anemia, unspecified: Secondary | ICD-10-CM | POA: Insufficient documentation

## 2022-12-29 DIAGNOSIS — Z8042 Family history of malignant neoplasm of prostate: Secondary | ICD-10-CM | POA: Insufficient documentation

## 2022-12-29 DIAGNOSIS — K449 Diaphragmatic hernia without obstruction or gangrene: Secondary | ICD-10-CM | POA: Insufficient documentation

## 2022-12-29 DIAGNOSIS — M129 Arthropathy, unspecified: Secondary | ICD-10-CM | POA: Insufficient documentation

## 2022-12-29 DIAGNOSIS — E559 Vitamin D deficiency, unspecified: Secondary | ICD-10-CM | POA: Insufficient documentation

## 2022-12-29 DIAGNOSIS — R600 Localized edema: Secondary | ICD-10-CM | POA: Insufficient documentation

## 2022-12-29 DIAGNOSIS — C9000 Multiple myeloma not having achieved remission: Secondary | ICD-10-CM | POA: Insufficient documentation

## 2022-12-29 DIAGNOSIS — M459 Ankylosing spondylitis of unspecified sites in spine: Secondary | ICD-10-CM | POA: Insufficient documentation

## 2022-12-29 DIAGNOSIS — G893 Neoplasm related pain (acute) (chronic): Secondary | ICD-10-CM | POA: Insufficient documentation

## 2022-12-29 DIAGNOSIS — Z79899 Other long term (current) drug therapy: Secondary | ICD-10-CM | POA: Insufficient documentation

## 2022-12-30 ENCOUNTER — Encounter: Payer: Self-pay | Admitting: Physician Assistant

## 2023-01-04 DIAGNOSIS — R600 Localized edema: Secondary | ICD-10-CM | POA: Insufficient documentation

## 2023-01-04 NOTE — Patient Instructions (Signed)
Patient hving significant musculoskeletal pain. Given 5 day supply Norco 10/325 to take sparingly. Is planning treatment for hyperthyroidism soon. Take metoprolol for palpitations. Take furosemide for leg swelling.

## 2023-01-05 ENCOUNTER — Inpatient Hospital Stay: Payer: Medicare Other | Admitting: Physician Assistant

## 2023-01-05 ENCOUNTER — Inpatient Hospital Stay: Payer: Medicare Other

## 2023-01-05 VITALS — BP 148/79 | HR 66 | Temp 97.7°F | Resp 17 | Wt 171.9 lb

## 2023-01-05 DIAGNOSIS — I10 Essential (primary) hypertension: Secondary | ICD-10-CM | POA: Diagnosis not present

## 2023-01-05 DIAGNOSIS — E559 Vitamin D deficiency, unspecified: Secondary | ICD-10-CM | POA: Diagnosis not present

## 2023-01-05 DIAGNOSIS — Z7982 Long term (current) use of aspirin: Secondary | ICD-10-CM | POA: Diagnosis not present

## 2023-01-05 DIAGNOSIS — K219 Gastro-esophageal reflux disease without esophagitis: Secondary | ICD-10-CM | POA: Diagnosis not present

## 2023-01-05 DIAGNOSIS — M459 Ankylosing spondylitis of unspecified sites in spine: Secondary | ICD-10-CM | POA: Diagnosis not present

## 2023-01-05 DIAGNOSIS — C9000 Multiple myeloma not having achieved remission: Secondary | ICD-10-CM

## 2023-01-05 DIAGNOSIS — R600 Localized edema: Secondary | ICD-10-CM | POA: Diagnosis not present

## 2023-01-05 DIAGNOSIS — Z8042 Family history of malignant neoplasm of prostate: Secondary | ICD-10-CM | POA: Diagnosis not present

## 2023-01-05 DIAGNOSIS — D649 Anemia, unspecified: Secondary | ICD-10-CM | POA: Diagnosis not present

## 2023-01-05 DIAGNOSIS — G893 Neoplasm related pain (acute) (chronic): Secondary | ICD-10-CM | POA: Diagnosis not present

## 2023-01-05 DIAGNOSIS — K449 Diaphragmatic hernia without obstruction or gangrene: Secondary | ICD-10-CM | POA: Diagnosis not present

## 2023-01-05 DIAGNOSIS — Z79624 Long term (current) use of inhibitors of nucleotide synthesis: Secondary | ICD-10-CM | POA: Diagnosis not present

## 2023-01-05 DIAGNOSIS — Z79899 Other long term (current) drug therapy: Secondary | ICD-10-CM | POA: Diagnosis not present

## 2023-01-05 DIAGNOSIS — Z803 Family history of malignant neoplasm of breast: Secondary | ICD-10-CM | POA: Diagnosis not present

## 2023-01-05 DIAGNOSIS — M129 Arthropathy, unspecified: Secondary | ICD-10-CM | POA: Diagnosis not present

## 2023-01-05 DIAGNOSIS — Z7961 Long term (current) use of immunomodulator: Secondary | ICD-10-CM | POA: Diagnosis not present

## 2023-01-05 LAB — LACTATE DEHYDROGENASE: LDH: 151 U/L (ref 98–192)

## 2023-01-05 LAB — CMP (CANCER CENTER ONLY)
ALT: 10 U/L (ref 0–44)
AST: 12 U/L — ABNORMAL LOW (ref 15–41)
Albumin: 4.3 g/dL (ref 3.5–5.0)
Alkaline Phosphatase: 88 U/L (ref 38–126)
Anion gap: 6 (ref 5–15)
BUN: 9 mg/dL (ref 6–20)
CO2: 27 mmol/L (ref 22–32)
Calcium: 9.8 mg/dL (ref 8.9–10.3)
Chloride: 106 mmol/L (ref 98–111)
Creatinine: 0.67 mg/dL (ref 0.44–1.00)
GFR, Estimated: >60 mL/min (ref >60)
Glucose, Bld: 82 mg/dL (ref 70–99)
Potassium: 3.5 mmol/L (ref 3.5–5.1)
Sodium: 139 mmol/L (ref 135–145)
Total Bilirubin: 0.5 mg/dL (ref 0.3–1.2)
Total Protein: 7.8 g/dL (ref 6.5–8.1)

## 2023-01-05 LAB — CBC WITH DIFFERENTIAL (CANCER CENTER ONLY)
Abs Immature Granulocytes: 0.01 10*3/uL (ref 0.00–0.07)
Basophils Absolute: 0.1 10*3/uL (ref 0.0–0.1)
Basophils Relative: 1 %
Eosinophils Absolute: 0.2 10*3/uL (ref 0.0–0.5)
Eosinophils Relative: 4 %
HCT: 35.5 % — ABNORMAL LOW (ref 36.0–46.0)
Hemoglobin: 12 g/dL (ref 12.0–15.0)
Immature Granulocytes: 0 %
Lymphocytes Relative: 26 %
Lymphs Abs: 1.2 10*3/uL (ref 0.7–4.0)
MCH: 28.8 pg (ref 26.0–34.0)
MCHC: 33.8 g/dL (ref 30.0–36.0)
MCV: 85.3 fL (ref 80.0–100.0)
Monocytes Absolute: 0.5 10*3/uL (ref 0.1–1.0)
Monocytes Relative: 11 %
Neutro Abs: 2.7 10*3/uL (ref 1.7–7.7)
Neutrophils Relative %: 58 %
Platelet Count: 224 10*3/uL (ref 150–400)
RBC: 4.16 MIL/uL (ref 3.87–5.11)
RDW: 14.4 % (ref 11.5–15.5)
WBC Count: 4.6 10*3/uL (ref 4.0–10.5)
nRBC: 0 % (ref 0.0–0.2)

## 2023-01-05 NOTE — Progress Notes (Signed)
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  05/05/2022: Cycle 1 Day 1 of VRD therapy 05/27/2022: Cycle 2 Day 1 of VRD therapy 06/17/2022: Cycle 3 Day 1 of VRD therapy 07/08/2022: Cycle 4 Day 1 of VRD therapy 07/29/2022: Cycle 5 Day 1 of VRD therapy 08/19/2022: Cycle 6 Day 1 of VRD therapy 09/09/2022: Cycle 7 Day 1 of VRD therapy 09/30/2022: Cycle 8 Day 1 of VRD therapy 10/23/2022: Started maintenance Revlimid therapy  Interval History:  Sonya Franklin 57 y.o. female with medical history significant for recently diagnosed multiple myeloma with plasmacytoma who presents for a follow up visit. The patient's last visit was on 11/30/2022. She presents today for a follow up after starting maintenance Revlimid therapy.   On exam today Sonya Franklin reports after undergoing RAI therapy for hyperthyroidism on 12/28/2022, her lower extremity edema has improved. She reports her energy and appetite are stable. She denies any nausea, vomiting, or bowel habit changes.  She denies any new bone or back pain.  She denies fevers, chills, sweats, shortness of breath, chest pain or cough. She has no other complaints. Rest of the 10 point ROS is below.   MEDICAL HISTORY:  Past Medical History:  Diagnosis Date   Ankylosing spondylitis (HCC)    Anxiety    Arthritis    Breast cancer (HCC)    right breast   Bursitis of hip, right    Cardiac arrhythmia    Carotid stenosis 08/26/2021   GE reflux    Hiatal hernia    Hypertension    Hyperthyroidism    Ischemic bowel disease (HCC)    Multiple myeloma not having achieved remission (HCC)     Prediabetes 08/26/2021   Snoring 03/21/2014   Vitamin D deficiency     SURGICAL HISTORY: Past Surgical History:  Procedure Laterality Date   ABDOMINAL HYSTERECTOMY     BREAST LUMPECTOMY     right   MASTECTOMY     double    SOCIAL HISTORY: Social History   Socioeconomic History   Marital status: Divorced    Spouse name: Not on file   Number of children: 1   Years of education: Not on file   Highest education level: Not on file  Occupational History    Employer: UNEMPLOYED  Tobacco Use   Smoking status: Never   Smokeless tobacco: Never  Vaping Use   Vaping status: Never Used  Substance and Sexual Activity   Alcohol use: No   Drug use: No   Sexual activity: Yes  Other Topics Concern   Not on file  Social History Narrative   Caffeine occasional drinker.   Not working/disabled.  12th grader. Divorced, one kids.       Social history: She receives disability benefits for history of breast cancer 2007 and ankylosing spondylitis.  She resides with special needs son.  She is divorced.  Non-smoker.  No alcohol consumption.       Family history: Father with history of prostate cancer.  Patient's mother was diagnosed with breast cancer at age 74.  Maternal aunt with history of breast cancer.  3 brothers.   Right handed   Caffeine occas  One level stays on this level       Social Determinants of Health   Financial Resource Strain: Low Risk  (08/26/2021)   Overall Financial Resource Strain (CARDIA)    Difficulty of Paying Living Expenses: Not hard at all  Food Insecurity: No Food Insecurity (05/25/2022)   Hunger Vital Sign    Worried About Running Out of Food in the Last Year: Never true    Ran Out of Food in the Last Year: Never true  Transportation Needs: No Transportation Needs (08/26/2021)   PRAPARE - Administrator, Civil Service (Medical): No    Lack of Transportation (Non-Medical): No  Physical Activity: Inactive (08/26/2021)   Exercise Vital Sign     Days of Exercise per Week: 0 days    Minutes of Exercise per Session: 0 min  Stress: Not on file  Social Connections: Not on file  Intimate Partner Violence: Not At Risk (04/21/2022)   Humiliation, Afraid, Rape, and Kick questionnaire    Fear of Current or Ex-Partner: No    Emotionally Abused: No    Physically Abused: No    Sexually Abused: No    FAMILY HISTORY: Family History  Problem Relation Age of Onset   Thyroid disease Mother    Diabetes Mother    Hypertension Mother    Hyperlipidemia Mother    High blood pressure Father    High Cholesterol Father    Colitis Brother    Goiter Maternal Grandmother    Breast cancer Maternal Aunt     ALLERGIES:  is allergic to shellfish allergy, blue dyes (parenteral), and hydrochlorothiazide.  MEDICATIONS:  Current Outpatient Medications  Medication Sig Dispense Refill   acetaminophen (TYLENOL) 500 MG tablet Take 500 mg by mouth every 6 (six) hours as needed for moderate pain.     acyclovir (ZOVIRAX) 400 MG tablet TAKE 1 TABLET BY MOUTH TWICE A DAY 180 tablet 1   ALPRAZolam (XANAX) 0.5 MG tablet Take 1 tablet (0.5 mg total) by mouth 3 (three) times daily as needed for anxiety. 60 tablet 2   amLODipine (NORVASC) 5 MG tablet TAKE 1 TABLET (5 MG TOTAL) BY MOUTH DAILY. 90 tablet 2   aspirin EC 81 MG tablet Take 81 mg by mouth daily. Swallow whole.     furosemide (LASIX) 20 MG tablet TAKE 1 TABLET BY MOUTH EVERY DAY AS NEEDED 90 tablet 0   HYDROcodone-acetaminophen (NORCO) 10-325 MG tablet One half to one tab by mouth with food every 8-12 hours for pain. 15 tablet 0   hyoscyamine (ANASPAZ) 0.125 MG TBDP disintergrating tablet Place 1 tablet (0.125 mg total) under the tongue every 4 (four) hours as needed for cramping. 30 tablet 0   KLOR-CON M20 20 MEQ tablet TAKE 2 TABLETS BY MOUTH EVERY DAY 180 tablet 1   lenalidomide (REVLIMID) 10 MG capsule Take 1 capsule (10 mg total) by mouth daily. [employer] Auth #  [id number]  Date Obtained 12/21/2022  Take 1  capsule daily for 21 days then none for 7 days 21 capsule 0   lidocaine (LIDODERM) 5 % Place 1 patch onto the skin daily. Remove & Discard patch within 12 hours or as directed by MD 30 patch 1   metoprolol tartrate (LOPRESSOR) 25 MG tablet ONE HALF TAB BY MOUTH UP TO TWICE DAILY IF NEEDED FOR PALPITATIONS 90 tablet 1   nystatin (MYCOSTATIN) 100000 UNIT/ML suspension Take 5 mLs (500,000 Units total) by mouth 4 (four) times daily. 60 mL 0   olopatadine (PATANOL)  0.1 % ophthalmic solution Place 1 drop into both eyes 2 (two) times daily as needed. 5 mL PRN   pantoprazole (PROTONIX) 40 MG tablet TAKE 1 TABLET BY MOUTH EVERY DAY 90 tablet 3   triamcinolone cream (KENALOG) 0.1 % Apply 1 Application topically 3 (three) times daily. (Patient taking differently: Apply 1 Application topically 3 (three) times daily. As needed) 30 g 1   No current facility-administered medications for this visit.    REVIEW OF SYSTEMS:   Constitutional: ( - ) fevers, ( - )  chills , ( - ) night sweats Eyes: ( - ) blurriness of vision, ( - ) double vision, ( - ) watery eyes Ears, nose, mouth, throat, and face: ( - ) mucositis, ( - ) sore throat Respiratory: ( - ) cough, ( - ) dyspnea, ( - ) wheezes Cardiovascular: ( - ) palpitation, ( - ) chest discomfort, ( + ) lower extremity swelling Gastrointestinal:  ( - ) nausea, ( - ) heartburn, ( - ) change in bowel habits Skin: ( - ) abnormal skin rashes Lymphatics: ( - ) new lymphadenopathy, ( - ) easy bruising Neurological: ( - ) numbness, ( - ) tingling, ( - ) new weaknesses Behavioral/Psych: ( - ) mood change, ( - ) new changes  All other systems were reviewed with the patient and are negative.  PHYSICAL EXAMINATION: ECOG PERFORMANCE STATUS: 1 - Symptomatic but completely ambulatory  Vitals:   01/05/23 1502  BP: (!) 148/79  Pulse: 66  Resp: 17  Temp: 97.7 F (36.5 C)  SpO2: 100%      Filed Weights   01/05/23 1502  Weight: 171 lb 14.4 oz (78 kg)    GENERAL:  Well-appearing middle-aged African-American female, alert, no distress and comfortable SKIN: skin color, texture, turgor are normal, no rashes or significant lesions EYES: conjunctiva are pink and non-injected, sclera clear LUNGS: clear to auscultation and percussion with normal breathing effort HEART: regular rate & rhythm and no murmurs. Improving bilateral lower extremity edema. Musculoskeletal: no cyanosis of digits and no clubbing  PSYCH: alert & oriented x 3, fluent speech NEURO: no focal motor/sensory deficits  LABORATORY DATA:  I have reviewed the data as listed    Latest Ref Rng & Units 01/05/2023    2:38 PM 11/30/2022    1:38 PM 11/03/2022   10:41 AM  CBC  WBC 4.0 - 10.5 K/uL 4.6  5.1  5.1   Hemoglobin 12.0 - 15.0 g/dL 16.1  09.6  04.5   Hematocrit 36.0 - 46.0 % 35.5  35.9  33.4   Platelets 150 - 400 K/uL 224  251  238        Latest Ref Rng & Units 01/05/2023    2:38 PM 11/30/2022    1:38 PM 11/03/2022   10:41 AM  CMP  Glucose 70 - 99 mg/dL 82  409  93   BUN 6 - 20 mg/dL 9  11  12    Creatinine 0.44 - 1.00 mg/dL 8.11  9.14  7.82   Sodium 135 - 145 mmol/L 139  141  142   Potassium 3.5 - 5.1 mmol/L 3.5  3.3  3.6   Chloride 98 - 111 mmol/L 106  108  110   CO2 22 - 32 mmol/L 27  26  26    Calcium 8.9 - 10.3 mg/dL 9.8  9.5  9.3   Total Protein 6.5 - 8.1 g/dL 7.8  7.6  7.0   Total Bilirubin 0.3 - 1.2 mg/dL  0.5  0.6  0.5   Alkaline Phos 38 - 126 U/L 88  82  68   AST 15 - 41 U/L 12  18  10    ALT 0 - 44 U/L 10  18  8      Lab Results  Component Value Date   MPROTEIN Not Observed 11/30/2022   MPROTEIN Not Observed 11/03/2022   MPROTEIN Not Observed 09/30/2022   Lab Results  Component Value Date   KPAFRELGTCHN 23.0 (H) 11/30/2022   KPAFRELGTCHN 16.2 11/03/2022   KPAFRELGTCHN 12.3 09/30/2022   LAMBDASER 15.7 11/30/2022   LAMBDASER 14.3 11/03/2022   LAMBDASER 8.6 09/30/2022   KAPLAMBRATIO 1.46 11/30/2022   KAPLAMBRATIO 1.13 11/03/2022   KAPLAMBRATIO 1.43 09/30/2022      RADIOGRAPHIC STUDIES: No results found.  ASSESSMENT & PLAN Sonya Franklin is a 57 y.o. female with medical history significant for newly diagnosed multiple myeloma with plasmacytoma who presents for a follow up visit.  # Multiple Myeloma with Plasmactyoma --Confirmed with L5 bone lesion biopsy and bone marrow biopsy. --Started VRD therapy on 05/05/2022.  --Patient completed her consultation with [facility] BMT on 08/13/2022.  She notes that she would prefer to pursue maintenance therapy over bone marrow transplant. --After 8 cycles, transitioned to maintenance Revlimid 10 mg PO daily 21 days on, 7 days off on 10/23/2022.  PLAN: --Currently on maintenance therapy with Revlimid 10 mg p.o. daily 21 days on and 7 days off. --Labs today reviewed and adequate for treatment.  Labs show white blood cell count 4.6, hemoglobin 12.0, MCV 85.3, and platelets of 224. Creatinine and calcium levels are in range --Last myeloma labs from 11/30/2022 showed M protein was undetectablle. Kappa light chain increased to 23.0.  --Continue with Revlimid therapy without any dose modifications.  --RTC in 4 weeks with repeat labs   #B/L lower extremity edema: --Doppler US from 11/03/2022 ruled out DVT --Edema has improved after undergoing RAI I-131 therapy for thyroid disease on 12/28/2022.  --Continue on lasix therapy managed by PCP  #Normocytic anemia--improved: --Etiologies include Revlimid therapy or iron deficiency  --received 1 dose of Feraheme on 11/24/2022.  --Hgb improved to 12.0 today  # Right lower extremity neuropathy: --Greatly improved --Monitor for now.   # Cancer related pain: --Secondary to focal tumor replacement of the right-side of the L5 vertebral body extending into the right L5 pedicle and posterior elements with a small extraosseous component extending into the right L5 neural foramen with moderate-severe stenosi -- Unable to tolerate opoid medications -- Under the care of  pallative care. Medication includes lidocaine pain patch and tylenol with mild relief. --Patient is not interested in palliative radiation at this time.   #Supportive Care -- chemotherapy education completed -- port placement not required.  -- zofran 8mg  q8H PRN and compazine 10mg  PO q6H for nausea -- acyclovir 400mg  PO BID for VCZ prophylaxis -- Pain medication as noted above  No orders of the defined types were placed in this encounter.  All questions were answered. The patient knows to call the clinic with any problems, questions or concerns.  I have spent a total of 30 minutes minutes of face-to-face and non-face-to-face time, preparing to see the patient, performing a medically appropriate examination, counseling and educating the patient, documenting clinical information in the electronic health record, independently interpreting results and communicating results to the patient, and care coordination.   Georga Kaufmann PA-C Dept of Hematology and Oncology [facility] Phone: [phone]  01/05/2023 9:25 PM

## 2023-01-06 LAB — KAPPA/LAMBDA LIGHT CHAINS
Kappa free light chain: 25.2 mg/L — ABNORMAL HIGH (ref 3.3–19.4)
Kappa, lambda light chain ratio: 1.46 (ref 0.26–1.65)
Lambda free light chains: 17.3 mg/L (ref 5.7–26.3)

## 2023-01-08 LAB — MULTIPLE MYELOMA PANEL, SERUM
Albumin SerPl Elph-Mcnc: 3.8 g/dL (ref 2.9–4.4)
Albumin/Glob SerPl: 1.2 (ref 0.7–1.7)
Alpha 1: 0.2 g/dL (ref 0.0–0.4)
Alpha2 Glob SerPl Elph-Mcnc: 0.7 g/dL (ref 0.4–1.0)
B-Globulin SerPl Elph-Mcnc: 1 g/dL (ref 0.7–1.3)
Gamma Glob SerPl Elph-Mcnc: 1.3 g/dL (ref 0.4–1.8)
Globulin, Total: 3.3 g/dL (ref 2.2–3.9)
IgA: 175 mg/dL (ref 87–352)
IgG (Immunoglobin G), Serum: 1389 mg/dL (ref 586–1602)
IgM (Immunoglobulin M), Srm: 36 mg/dL (ref 26–217)
Total Protein ELP: 7.1 g/dL (ref 6.0–8.5)

## 2023-01-15 ENCOUNTER — Other Ambulatory Visit: Payer: Self-pay | Admitting: *Deleted

## 2023-01-15 MED ORDER — LENALIDOMIDE 10 MG PO CAPS: 10.0000 mg | ORAL_CAPSULE | Freq: Every day | ORAL | 0 refills | Status: DC

## 2023-01-18 ENCOUNTER — Other Ambulatory Visit: Payer: Self-pay | Admitting: Internal Medicine

## 2023-01-20 ENCOUNTER — Telehealth: Payer: Self-pay

## 2023-01-20 ENCOUNTER — Other Ambulatory Visit: Payer: Self-pay | Admitting: Endocrinology

## 2023-01-20 DIAGNOSIS — E0520 Thyrotoxicosis with toxic multinodular goiter without thyrotoxic crisis or storm: Secondary | ICD-10-CM

## 2023-01-20 NOTE — Telephone Encounter (Signed)
VM left to schedule appointment for labs as directed by MD.

## 2023-01-20 NOTE — Telephone Encounter (Signed)
-----   Message from [place] Thapa sent at 01/20/2023  5:50 AM EST ----- Noted.  I have placed order to check thyroid lab 2 to 3 days prior to follow-up visit with me in December 5.  Please set of lab visit for the patient.

## 2023-01-27 ENCOUNTER — Other Ambulatory Visit: Payer: Self-pay

## 2023-01-27 ENCOUNTER — Inpatient Hospital Stay: Payer: Medicare Other | Attending: Physician Assistant

## 2023-01-27 ENCOUNTER — Inpatient Hospital Stay: Payer: Medicare Other | Admitting: Hematology and Oncology

## 2023-01-27 VITALS — BP 148/85 | HR 55 | Temp 97.3°F | Resp 16 | Wt 172.6 lb

## 2023-01-27 DIAGNOSIS — E039 Hypothyroidism, unspecified: Secondary | ICD-10-CM

## 2023-01-27 DIAGNOSIS — R002 Palpitations: Secondary | ICD-10-CM | POA: Diagnosis not present

## 2023-01-27 DIAGNOSIS — Z7982 Long term (current) use of aspirin: Secondary | ICD-10-CM | POA: Insufficient documentation

## 2023-01-27 DIAGNOSIS — C9000 Multiple myeloma not having achieved remission: Secondary | ICD-10-CM | POA: Diagnosis not present

## 2023-01-27 DIAGNOSIS — D649 Anemia, unspecified: Secondary | ICD-10-CM | POA: Insufficient documentation

## 2023-01-27 DIAGNOSIS — Z7961 Long term (current) use of immunomodulator: Secondary | ICD-10-CM | POA: Diagnosis not present

## 2023-01-27 DIAGNOSIS — Z79624 Long term (current) use of inhibitors of nucleotide synthesis: Secondary | ICD-10-CM | POA: Diagnosis not present

## 2023-01-27 DIAGNOSIS — Z79899 Other long term (current) drug therapy: Secondary | ICD-10-CM | POA: Diagnosis not present

## 2023-01-27 LAB — CMP (CANCER CENTER ONLY)
ALT: 13 U/L (ref 0–44)
AST: 14 U/L — ABNORMAL LOW (ref 15–41)
Albumin: 4.5 g/dL (ref 3.5–5.0)
Alkaline Phosphatase: 91 U/L (ref 38–126)
Anion gap: 6 (ref 5–15)
BUN: 11 mg/dL (ref 6–20)
CO2: 27 mmol/L (ref 22–32)
Calcium: 9.7 mg/dL (ref 8.9–10.3)
Chloride: 108 mmol/L (ref 98–111)
Creatinine: 0.66 mg/dL (ref 0.44–1.00)
GFR, Estimated: >60 mL/min (ref >60)
Glucose, Bld: 87 mg/dL (ref 70–99)
Potassium: 3.7 mmol/L (ref 3.5–5.1)
Sodium: 141 mmol/L (ref 135–145)
Total Bilirubin: 0.4 mg/dL (ref <1.2)
Total Protein: 8.1 g/dL (ref 6.5–8.1)

## 2023-01-27 LAB — CBC WITH DIFFERENTIAL (CANCER CENTER ONLY)
Abs Immature Granulocytes: 0.01 10*3/uL (ref 0.00–0.07)
Basophils Absolute: 0.1 10*3/uL (ref 0.0–0.1)
Basophils Relative: 2 %
Eosinophils Absolute: 0.2 10*3/uL (ref 0.0–0.5)
Eosinophils Relative: 4 %
HCT: 36.1 % (ref 36.0–46.0)
Hemoglobin: 12.2 g/dL (ref 12.0–15.0)
Immature Granulocytes: 0 %
Lymphocytes Relative: 33 %
Lymphs Abs: 1.9 10*3/uL (ref 0.7–4.0)
MCH: 28.7 pg (ref 26.0–34.0)
MCHC: 33.8 g/dL (ref 30.0–36.0)
MCV: 84.9 fL (ref 80.0–100.0)
Monocytes Absolute: 0.7 10*3/uL (ref 0.1–1.0)
Monocytes Relative: 13 %
Neutro Abs: 2.8 10*3/uL (ref 1.7–7.7)
Neutrophils Relative %: 48 %
Platelet Count: 213 10*3/uL (ref 150–400)
RBC: 4.25 MIL/uL (ref 3.87–5.11)
RDW: 15.1 % (ref 11.5–15.5)
WBC Count: 5.7 10*3/uL (ref 4.0–10.5)
nRBC: 0 % (ref 0.0–0.2)

## 2023-01-27 LAB — LACTATE DEHYDROGENASE: LDH: 161 U/L (ref 98–192)

## 2023-01-27 NOTE — Progress Notes (Signed)
/ [facility] [phone]   [phone]  PROGRESS NOTE  Patient Care Team: Margaree Mackintosh, MD as PCP - General (Internal Medicine)  Hematological/Oncological History # Multiple Myeloma with Plasmacytoma 03/03/2022: established with the [facility] in Oncology  04/15/2022: CT biopsy and bone marrow biopsy confirmed the presence of a plasmacytoma and bone marrow involvement of multiple myeloma.  05/05/2022: Cycle 1 Day 1 of VRD therapy 05/27/2022: Cycle 2 Day 1 of VRD therapy 06/17/2022: Cycle 3 Day 1 of VRD therapy 07/08/2022: Cycle 4 Day 1 of VRD therapy 07/29/2022: Cycle 5 Day 1 of VRD therapy 08/19/2022: Cycle 6 Day 1 of VRD therapy 09/09/2022: Cycle 7 Day 1 of VRD therapy 09/30/2022: Cycle 8 Day 1 of VRD therapy 10/23/2022: Started maintenance Revlimid therapy  Interval History:  Sonya Franklin 57 y.o. female with medical history significant for recently diagnosed multiple myeloma with plasmacytoma who presents for a follow up visit. The patient's last visit was on 01/05/2023. She presents today for a follow up for maintenance Revlimid therapy.   On exam today Mrs. Campione reports she has been well overall and interim since her last visit.  She reports that her appetite has been good and her weight is up 1 pound from her last visit.  She reports her energy is about a 7 out of 10.  She is concerned about her thyroid levels after recently being treated with RAI.  She reports that she is faithfully taking her Revlimid pills and is about to finish her week off.  She will restart a 21-day course tomorrow.  She reports he is not having any side effects as result the medication.  She did recently have a severe spasm in her back which was causing quite a bit of discomfort.  Since then she did restart taking her potassium pills and notes that she has not had a spasm since that time.  She has not had any runny nose, sore throat, or cough.  She denies fevers, chills, sweats,  shortness of breath, chest pain or cough. She has no other complaints. Rest of the 10 point ROS is below.   MEDICAL HISTORY:  Past Medical History:  Diagnosis Date   Ankylosing spondylitis (HCC)    Anxiety    Arthritis    Breast cancer (HCC)    right breast   Bursitis of hip, right    Cardiac arrhythmia    Carotid stenosis 08/26/2021   GE reflux    Hiatal hernia    Hypertension    Hyperthyroidism    Ischemic bowel disease (HCC)    Multiple myeloma not having achieved remission (HCC)    Prediabetes 08/26/2021   Snoring 03/21/2014   Vitamin D deficiency     SURGICAL HISTORY: Past Surgical History:  Procedure Laterality Date   ABDOMINAL HYSTERECTOMY     BREAST LUMPECTOMY     right   MASTECTOMY     double    SOCIAL HISTORY: Social History   Socioeconomic History   Marital status: Divorced    Spouse name: Not on file   Number of children: 1   Years of education: Not on file   Highest education level: Not on file  Occupational History    Employer: UNEMPLOYED  Tobacco Use   Smoking status: Never   Smokeless tobacco: Never  Vaping Use   Vaping status: Never Used  Substance and Sexual Activity   Alcohol use: No   Drug use: No   Sexual activity: Yes  Other  Topics Concern   Not on file  Social History Narrative   Caffeine occasional drinker.   Not working/disabled.  12th grader. Divorced, one kids.       Social history: She receives disability benefits for history of breast cancer 2007 and ankylosing spondylitis.  She resides with special needs son.  She is divorced.  Non-smoker.  No alcohol consumption.       Family history: Father with history of prostate cancer.  Patient's mother was diagnosed with breast cancer at age 11.  Maternal aunt with history of breast cancer.  3 brothers.   Right handed   Caffeine occas   One level stays on this level       Social Determinants of Health   Financial Resource Strain: Low Risk  (08/26/2021)   Overall Financial  Resource Strain (CARDIA)    Difficulty of Paying Living Expenses: Not hard at all  Food Insecurity: No Food Insecurity (05/25/2022)   Hunger Vital Sign    Worried About Running Out of Food in the Last Year: Never true    Ran Out of Food in the Last Year: Never true  Transportation Needs: No Transportation Needs (08/26/2021)   PRAPARE - Administrator, Civil Service (Medical): No    Lack of Transportation (Non-Medical): No  Physical Activity: Inactive (08/26/2021)   Exercise Vital Sign    Days of Exercise per Week: 0 days    Minutes of Exercise per Session: 0 min  Stress: Not on file  Social Connections: Not on file  Intimate Partner Violence: Not At Risk (04/21/2022)   Humiliation, Afraid, Rape, and Kick questionnaire    Fear of Current or Ex-Partner: No    Emotionally Abused: No    Physically Abused: No    Sexually Abused: No    FAMILY HISTORY: Family History  Problem Relation Age of Onset   Thyroid disease Mother    Diabetes Mother    Hypertension Mother    Hyperlipidemia Mother    High blood pressure Father    High Cholesterol Father    Colitis Brother    Goiter Maternal Grandmother    Breast cancer Maternal Aunt     ALLERGIES:  is allergic to shellfish allergy, blue dyes (parenteral), and hydrochlorothiazide.  MEDICATIONS:  Current Outpatient Medications  Medication Sig Dispense Refill   acetaminophen (TYLENOL) 500 MG tablet Take 500 mg by mouth every 6 (six) hours as needed for moderate pain.     acyclovir (ZOVIRAX) 400 MG tablet TAKE 1 TABLET BY MOUTH TWICE A DAY 180 tablet 1   ALPRAZolam (XANAX) 0.5 MG tablet Take 1 tablet (0.5 mg total) by mouth 3 (three) times daily as needed for anxiety. 60 tablet 2   amLODipine (NORVASC) 5 MG tablet TAKE 1 TABLET (5 MG TOTAL) BY MOUTH DAILY. 90 tablet 2   aspirin EC 81 MG tablet Take 81 mg by mouth daily. Swallow whole.     furosemide (LASIX) 20 MG tablet TAKE 1 TABLET BY MOUTH EVERY DAY AS NEEDED 90 tablet 0    hydrochlorothiazide (HYDRODIURIL) 25 MG tablet TAKE 1 TABLET (25 MG TOTAL) BY MOUTH DAILY. 90 tablet 3   HYDROcodone-acetaminophen (NORCO) 10-325 MG tablet One half to one tab by mouth with food every 8-12 hours for pain. 15 tablet 0   hyoscyamine (ANASPAZ) 0.125 MG TBDP disintergrating tablet Place 1 tablet (0.125 mg total) under the tongue every 4 (four) hours as needed for cramping. 30 tablet 0   KLOR-CON M20 20 MEQ tablet  TAKE 2 TABLETS BY MOUTH EVERY DAY 180 tablet 1   lenalidomide (REVLIMID) 10 MG capsule Take 1 capsule (10 mg total) by mouth daily. Siri Cole #[id number]    Date Obtained 01/15/2023  Take 1 capsule daily for 21 days then none for 7 days 21 capsule 0   lidocaine (LIDODERM) 5 % Place 1 patch onto the skin daily. Remove & Discard patch within 12 hours or as directed by MD 30 patch 1   metoprolol tartrate (LOPRESSOR) 25 MG tablet ONE HALF TAB BY MOUTH UP TO TWICE DAILY IF NEEDED FOR PALPITATIONS 90 tablet 1   nystatin (MYCOSTATIN) 100000 UNIT/ML suspension Take 5 mLs (500,000 Units total) by mouth 4 (four) times daily. 60 mL 0   olopatadine (PATANOL) 0.1 % ophthalmic solution Place 1 drop into both eyes 2 (two) times daily as needed. 5 mL PRN   pantoprazole (PROTONIX) 40 MG tablet TAKE 1 TABLET BY MOUTH EVERY DAY 90 tablet 3   triamcinolone cream (KENALOG) 0.1 % Apply 1 Application topically 3 (three) times daily. (Patient taking differently: Apply 1 Application topically 3 (three) times daily. As needed) 30 g 1   No current facility-administered medications for this visit.    REVIEW OF SYSTEMS:   Constitutional: ( - ) fevers, ( - )  chills , ( - ) night sweats Eyes: ( - ) blurriness of vision, ( - ) double vision, ( - ) watery eyes Ears, nose, mouth, throat, and face: ( - ) mucositis, ( - ) sore throat Respiratory: ( - ) cough, ( - ) dyspnea, ( - ) wheezes Cardiovascular: ( - ) palpitation, ( - ) chest discomfort, ( + ) lower extremity swelling Gastrointestinal:  ( - ) nausea,  ( - ) heartburn, ( - ) change in bowel habits Skin: ( - ) abnormal skin rashes Lymphatics: ( - ) new lymphadenopathy, ( - ) easy bruising Neurological: ( - ) numbness, ( - ) tingling, ( - ) new weaknesses Behavioral/Psych: ( - ) mood change, ( - ) new changes  All other systems were reviewed with the patient and are negative.  PHYSICAL EXAMINATION: ECOG PERFORMANCE STATUS: 1 - Symptomatic but completely ambulatory  Vitals:   01/27/23 1528  BP: (!) 148/85  Pulse: (!) 55  Resp: 16  Temp: (!) 97.3 F (36.3 C)  SpO2: 100%       Filed Weights   01/27/23 1528  Weight: 172 lb 9.6 oz (78.3 kg)     GENERAL: Well-appearing middle-aged African-American female, alert, no distress and comfortable SKIN: skin color, texture, turgor are normal, no rashes or significant lesions EYES: conjunctiva are pink and non-injected, sclera clear LUNGS: clear to auscultation and percussion with normal breathing effort HEART: regular rate & rhythm and no murmurs. Improving bilateral lower extremity edema. Musculoskeletal: no cyanosis of digits and no clubbing  PSYCH: alert & oriented x 3, fluent speech NEURO: no focal motor/sensory deficits  LABORATORY DATA:  I have reviewed the data as listed    Latest Ref Rng & Units 01/27/2023    3:03 PM 01/05/2023    2:38 PM 11/30/2022    1:38 PM  CBC  WBC 4.0 - 10.5 K/uL 5.7  4.6  5.1   Hemoglobin 12.0 - 15.0 g/dL 08.6  57.8  46.9   Hematocrit 36.0 - 46.0 % 36.1  35.5  35.9   Platelets 150 - 400 K/uL 213  224  251        Latest Ref Rng & Units 01/27/2023  3:03 PM 01/05/2023    2:38 PM 11/30/2022    1:38 PM  CMP  Glucose 70 - 99 mg/dL 87  82  782   BUN 6 - 20 mg/dL 11  9  11    Creatinine 0.44 - 1.00 mg/dL 9.56  2.13  0.86   Sodium 135 - 145 mmol/L 141  139  141   Potassium 3.5 - 5.1 mmol/L 3.7  3.5  3.3   Chloride 98 - 111 mmol/L 108  106  108   CO2 22 - 32 mmol/L 27  27  26    Calcium 8.9 - 10.3 mg/dL 9.7  9.8  9.5   Total Protein 6.5 - 8.1  g/dL 8.1  7.8  7.6   Total Bilirubin <1.2 mg/dL 0.4  0.5  0.6   Alkaline Phos 38 - 126 U/L 91  88  82   AST 15 - 41 U/L 14  12  18    ALT 0 - 44 U/L 13  10  18      Lab Results  Component Value Date   MPROTEIN Not Observed 01/05/2023   MPROTEIN Not Observed 11/30/2022   MPROTEIN Not Observed 11/03/2022   Lab Results  Component Value Date   KPAFRELGTCHN 25.2 (H) 01/05/2023   KPAFRELGTCHN 23.0 (H) 11/30/2022   KPAFRELGTCHN 16.2 11/03/2022   LAMBDASER 17.3 01/05/2023   LAMBDASER 15.7 11/30/2022   LAMBDASER 14.3 11/03/2022   KAPLAMBRATIO 1.46 01/05/2023   KAPLAMBRATIO 1.46 11/30/2022   KAPLAMBRATIO 1.13 11/03/2022     RADIOGRAPHIC STUDIES: No results found.  ASSESSMENT & PLAN Sonya Franklin is a 57 y.o. female with medical history significant for newly diagnosed multiple myeloma with plasmacytoma who presents for a follow up visit.  # Multiple Myeloma with Plasmactyoma --Confirmed with L5 bone lesion biopsy and bone marrow biopsy. --Started VRD therapy on 05/05/2022.  --Patient completed her consultation with [facility] on 08/13/2022.  She notes that she would prefer to pursue maintenance therapy over bone marrow transplant. --After 8 cycles, transitioned to maintenance Revlimid 10 mg PO daily 21 days on, 7 days off on 10/23/2022.  PLAN: --Currently on maintenance therapy with Revlimid 10 mg p.o. daily 21 days on and 7 days off. --Labs today reviewed and adequate for treatment.  Labs show white blood cell count 5.7, hemoglobin 12.2, MCV 84.9, platelets 213. Creatinine and calcium levels are in range --Last myeloma labs from 01/05/2023 showed M protein was undetectablle. Kappa light chain increased to 25.2.  --Continue with Revlimid therapy without any dose modifications.  --RTC in 4 weeks for labs and 12 weeks for clinic visit   #B/L lower extremity edema: --Doppler US from 11/03/2022 ruled out DVT --Edema has improved after undergoing RAI I-131 therapy for thyroid  disease on 12/28/2022.  --Continue on lasix therapy managed by PCP  #Normocytic anemia--improved: --Etiologies include Revlimid therapy or iron deficiency  --received 1 dose of Feraheme on 11/24/2022.   # Right lower extremity neuropathy: --Greatly improved --Monitor for now.   # Cancer related pain: --Secondary to focal tumor replacement of the right-side of the L5 vertebral body extending into the right L5 pedicle and posterior elements with a small extraosseous component extending into the right L5 neural foramen with moderate-severe stenosi -- Unable to tolerate opoid medications -- Under the care of pallative care. Medication includes lidocaine pain patch and tylenol with mild relief. --Patient is not interested in palliative radiation at this time.   #Supportive Care -- chemotherapy education completed -- port placement not  required.  -- zofran 8mg  q8H PRN and compazine 10mg  PO q6H for nausea -- acyclovir 400mg  PO BID for VCZ prophylaxis -- Pain medication as noted above  Orders Placed This Encounter  Procedures   TSH    Standing Status:   Future    Number of Occurrences:   1    Standing Expiration Date:   01/27/2024   T4    Standing Status:   Future    Number of Occurrences:   1    Standing Expiration Date:   01/27/2024   All questions were answered. The patient knows to call the clinic with any problems, questions or concerns.  I have spent a total of 30 minutes minutes of face-to-face and non-face-to-face time, preparing to see the patient, performing a medically appropriate examination, counseling and educating the patient, documenting clinical information in the electronic health record, independently interpreting results and communicating results to the patient, and care coordination.   Ulysees Barns, MD Department of Hematology/Oncology [facility] Phone: [phone] Pager: [phone] Email:  [email or web address]    01/27/2023 3:59 PM

## 2023-01-28 LAB — T4: T4, Total: 6.6 ug/dL (ref 4.5–12.0)

## 2023-01-28 LAB — KAPPA/LAMBDA LIGHT CHAINS
Kappa free light chain: 25 mg/L — ABNORMAL HIGH (ref 3.3–19.4)
Kappa, lambda light chain ratio: 1.42 (ref 0.26–1.65)
Lambda free light chains: 17.6 mg/L (ref 5.7–26.3)

## 2023-01-28 LAB — TSH: TSH: 0.266 u[IU]/mL — ABNORMAL LOW (ref 0.350–4.500)

## 2023-01-29 LAB — MULTIPLE MYELOMA PANEL, SERUM
Albumin SerPl Elph-Mcnc: 4 g/dL (ref 2.9–4.4)
Albumin/Glob SerPl: 1.2 (ref 0.7–1.7)
Alpha 1: 0.3 g/dL (ref 0.0–0.4)
Alpha2 Glob SerPl Elph-Mcnc: 0.8 g/dL (ref 0.4–1.0)
B-Globulin SerPl Elph-Mcnc: 1.1 g/dL (ref 0.7–1.3)
Gamma Glob SerPl Elph-Mcnc: 1.5 g/dL (ref 0.4–1.8)
Globulin, Total: 3.6 g/dL (ref 2.2–3.9)
IgA: 192 mg/dL (ref 87–352)
IgG (Immunoglobin G), Serum: 1593 mg/dL (ref 586–1602)
IgM (Immunoglobulin M), Srm: 38 mg/dL (ref 26–217)
Total Protein ELP: 7.6 g/dL (ref 6.0–8.5)

## 2023-02-01 ENCOUNTER — Other Ambulatory Visit (INDEPENDENT_AMBULATORY_CARE_PROVIDER_SITE_OTHER): Payer: Medicare Other

## 2023-02-01 DIAGNOSIS — E0520 Thyrotoxicosis with toxic multinodular goiter without thyrotoxic crisis or storm: Secondary | ICD-10-CM | POA: Diagnosis not present

## 2023-02-01 LAB — T3, FREE: T3, Free: 2.9 pg/mL (ref 2.3–4.2)

## 2023-02-01 LAB — T4, FREE: Free T4: 0.73 ng/dL (ref 0.60–1.60)

## 2023-02-01 LAB — TSH: TSH: 0.81 u[IU]/mL (ref 0.35–5.50)

## 2023-02-02 ENCOUNTER — Telehealth: Payer: Self-pay

## 2023-02-02 NOTE — Telephone Encounter (Signed)
-----   Message from [place] Thapa sent at 02/02/2023  8:10 AM EST ----- Please notify patient of labs reviewed normal thyroid function test after radioactive iodine treatment.  No medication is required at this time.  Will discuss further in coming follow-up visit in December 5.

## 2023-02-02 NOTE — Telephone Encounter (Signed)
VM left  to give results of labs.

## 2023-02-03 ENCOUNTER — Telehealth: Payer: Self-pay

## 2023-02-03 NOTE — Telephone Encounter (Signed)
T/C to pt to discuss lab results and Dr Derek Mound recommendations.  Pt is not taking thyroid medication but she did have the RAI treatment and asked if this is what may have caused this result and does she need to do anything else  Please advise

## 2023-02-03 NOTE — Telephone Encounter (Signed)
-----   Message from Nurse Lanora Manis T sent at 02/03/2023  9:22 AM EST -----  ----- Message ----- From: Jaci Standard, MD Sent: 02/02/2023   3:24 PM EST To: Kyra Searles, RN  Please let Ms. Wyndham know that her TSH levels are slightly low and her T4 levels are normal.  This typically implies that she is receiving slightly too much thyroid hormone.  Recommend that she discuss this with the provider prescribing her Synthroid. ----- Message ----- From: Interface, Lab In [place] Sent: 01/28/2023   8:40 AM EST To: Jaci Standard, MD

## 2023-02-08 NOTE — Telephone Encounter (Signed)
Pt advised per Dr Leonides Schanz the RAI treatment could cause the abnormal lab results and he still wants her to discuss with the prescribing Dr.  Rock Nephew with [facility] and agreed to this plan of care

## 2023-02-15 ENCOUNTER — Other Ambulatory Visit: Payer: Self-pay | Admitting: *Deleted

## 2023-02-15 MED ORDER — LENALIDOMIDE 10 MG PO CAPS: 10.0000 mg | ORAL_CAPSULE | Freq: Every day | ORAL | 0 refills | Status: DC

## 2023-02-18 ENCOUNTER — Encounter: Payer: Self-pay | Admitting: Endocrinology

## 2023-02-18 ENCOUNTER — Ambulatory Visit (INDEPENDENT_AMBULATORY_CARE_PROVIDER_SITE_OTHER): Payer: Medicare Other | Admitting: Endocrinology

## 2023-02-18 VITALS — BP 126/80 | HR 95 | Resp 20 | Ht 61.0 in | Wt 170.0 lb

## 2023-02-18 DIAGNOSIS — E0520 Thyrotoxicosis with toxic multinodular goiter without thyrotoxic crisis or storm: Secondary | ICD-10-CM

## 2023-02-18 DIAGNOSIS — E0590 Thyrotoxicosis, unspecified without thyrotoxic crisis or storm: Secondary | ICD-10-CM

## 2023-02-18 NOTE — Progress Notes (Signed)
Outpatient Endocrinology Note [name], MD  02/18/23  Patient's Name: Sonya Franklin    DOB: 11-29-1965    MRN: [id number]  REASON OF VISIT: Follow-up for thyroid nodules /subclinical hyperthyroidism  PCP: Margaree Mackintosh, MD  HISTORY OF PRESENT ILLNESS:   Sonya Franklin is a 57 y.o. old female with past medical history as listed below is presented for a follow up of thyroid nodules /subclinical hypothyroidism.  Pertinent Thyroid History:  Patient is known to have multiple bilateral thyroid nodules from 2006.  Patient has intermittently mildly low TSH with normal free T4 and free T3 consistent with subclinical hyperthyroidism at least from 2010. At that time she had symptoms of shakiness and palpitations.  She had intermittently low TSH over the years.  Patient was seen in endocrinology clinic in 2020 and May 2024 by Dr. Lucianne Muss.  She had thyroid scan in December 2010 which showed thyroid enlargement especially on the right side and heterogenous uptake and scattered area of increased and decreased uptake along with 24-hour uptake of 34.7%.   Ultrasound thyroid in April 2024 : Borderline enlarged multinodular thyroid consistent with goiter.  Bilateral thyroid nodules left inferior thyroid nodule solid, hypoechoic measuring 1.2 cm and right inferior thyroid nodule solid, hypoechoic, with echogenic foci measuring 2.0 cm, it was previously measured 2.7 cm in 2006.  Patient had ultrasound thyroid in November 2006 with fine-needle aspiration in June 2007 of right thyroid nodule with benign cytology /most consistent with a benign colloid nodule.  Family history of goiter and autoimmune thyroid disease. She had negative thyrotropin receptor antibody.  # She had radioactive iodine uptake and scan in August 2024 with 4-hour uptake of 15% and 24-hour uptake of 37% consistent with multinodular goiter.  -Patient had RAI I-131 therapy with 27.5 mCi on December 28, 2022 for subclinical hyperthyroidism.  #  Patient was diagnosed with multiple myeloma and currently on chemotherapy following with oncology.  Interval history Patient had RAI therapy in October, she has improvement of thyroid function test with normalizing of thyroid function test and TSH of 0.81 in November 18.  She complains of palpitations and taking metoprolol occasionally.  She complains of leg swelling and itchy eyes otherwise no new symptoms.  No neck compressive symptoms.    Latest Reference Range & Units 11/03/22 10:41 01/27/23 15:03 02/01/23 14:17  TSH 0.35 - 5.50 uIU/mL 0.012 (L) 0.266 (L) 0.81  Triiodothyronine,Free,Serum 2.3 - 4.2 pg/mL 4.0  2.9  T4,Free(Direct) 0.60 - 1.60 ng/dL 6.29  5.28  Thyroxine (T4) 4.5 - 12.0 ug/dL  6.6   (L): Data is abnormally low   REVIEW OF SYSTEMS:  As per history of present illness.   PAST MEDICAL HISTORY: Past Medical History:  Diagnosis Date   Ankylosing spondylitis (HCC)    Anxiety    Arthritis    Breast cancer (HCC)    right breast   Bursitis of hip, right    Cardiac arrhythmia    Carotid stenosis 08/26/2021   GE reflux    Hiatal hernia    Hypertension    Hyperthyroidism    Ischemic bowel disease (HCC)    Multiple myeloma not having achieved remission (HCC)    Prediabetes 08/26/2021   Snoring 03/21/2014   Vitamin D deficiency     PAST SURGICAL HISTORY: Past Surgical History:  Procedure Laterality Date   ABDOMINAL HYSTERECTOMY     BREAST LUMPECTOMY     right   MASTECTOMY     double    ALLERGIES: Allergies  Allergen Reactions   Shellfish Allergy Anaphylaxis   Blue Dyes (Parenteral)    Hydrochlorothiazide Itching    FAMILY HISTORY:  Family History  Problem Relation Age of Onset   Thyroid disease Mother    Diabetes Mother    Hypertension Mother    Hyperlipidemia Mother    High blood pressure Father    High Cholesterol Father    Colitis Brother    Goiter Maternal Grandmother    Breast cancer Maternal Aunt     SOCIAL HISTORY: Social History    Socioeconomic History   Marital status: Divorced    Spouse name: Not on file   Number of children: 1   Years of education: Not on file   Highest education level: Not on file  Occupational History    Employer: UNEMPLOYED  Tobacco Use   Smoking status: Never   Smokeless tobacco: Never  Vaping Use   Vaping status: Never Used  Substance and Sexual Activity   Alcohol use: No   Drug use: No   Sexual activity: Yes  Other Topics Concern   Not on file  Social History Narrative   Caffeine occasional drinker.   Not working/disabled.  12th grader. Divorced, one kids.       Social history: She receives disability benefits for history of breast cancer 2007 and ankylosing spondylitis.  She resides with special needs son.  She is divorced.  Non-smoker.  No alcohol consumption.       Family history: Father with history of prostate cancer.  Patient's mother was diagnosed with breast cancer at age 50.  Maternal aunt with history of breast cancer.  3 brothers.   Right handed   Caffeine occas   One level stays on this level       Social Determinants of Health   Financial Resource Strain: Low Risk  (08/26/2021)   Overall Financial Resource Strain (CARDIA)    Difficulty of Paying Living Expenses: Not hard at all  Food Insecurity: No Food Insecurity (05/25/2022)   Hunger Vital Sign    Worried About Running Out of Food in the Last Year: Never true    Ran Out of Food in the Last Year: Never true  Transportation Needs: No Transportation Needs (08/26/2021)   PRAPARE - Administrator, Civil Service (Medical): No    Lack of Transportation (Non-Medical): No  Physical Activity: Inactive (08/26/2021)   Exercise Vital Sign    Days of Exercise per Week: 0 days    Minutes of Exercise per Session: 0 min  Stress: Not on file  Social Connections: Not on file    MEDICATIONS:  Current Outpatient Medications  Medication Sig Dispense Refill   acetaminophen (TYLENOL) 500 MG tablet Take 500 mg by  mouth every 6 (six) hours as needed for moderate pain.     acyclovir (ZOVIRAX) 400 MG tablet TAKE 1 TABLET BY MOUTH TWICE A DAY 180 tablet 1   ALPRAZolam (XANAX) 0.5 MG tablet Take 1 tablet (0.5 mg total) by mouth 3 (three) times daily as needed for anxiety. 60 tablet 2   amLODipine (NORVASC) 5 MG tablet TAKE 1 TABLET (5 MG TOTAL) BY MOUTH DAILY. 90 tablet 2   aspirin EC 81 MG tablet Take 81 mg by mouth daily. Swallow whole.     furosemide (LASIX) 20 MG tablet TAKE 1 TABLET BY MOUTH EVERY DAY AS NEEDED 90 tablet 0   hydrochlorothiazide (HYDRODIURIL) 25 MG tablet TAKE 1 TABLET (25 MG TOTAL) BY MOUTH DAILY. 90 tablet 3  HYDROcodone-acetaminophen (NORCO) 10-325 MG tablet One half to one tab by mouth with food every 8-12 hours for pain. 15 tablet 0   hyoscyamine (ANASPAZ) 0.125 MG TBDP disintergrating tablet Place 1 tablet (0.125 mg total) under the tongue every 4 (four) hours as needed for cramping. 30 tablet 0   KLOR-CON M20 20 MEQ tablet TAKE 2 TABLETS BY MOUTH EVERY DAY 180 tablet 1   lenalidomide (REVLIMID) 10 MG capsule Take 1 capsule (10 mg total) by mouth daily. [employer] Auth #  [id number]  Date Obtained 02/15/2023  Take 1 capsule daily for 21 days then none for 7 days 21 capsule 0   lidocaine (LIDODERM) 5 % Place 1 patch onto the skin daily. Remove & Discard patch within 12 hours or as directed by MD 30 patch 1   metoprolol tartrate (LOPRESSOR) 25 MG tablet ONE HALF TAB BY MOUTH UP TO TWICE DAILY IF NEEDED FOR PALPITATIONS 90 tablet 1   nystatin (MYCOSTATIN) 100000 UNIT/ML suspension Take 5 mLs (500,000 Units total) by mouth 4 (four) times daily. 60 mL 0   olopatadine (PATANOL) 0.1 % ophthalmic solution Place 1 drop into both eyes 2 (two) times daily as needed. 5 mL PRN   pantoprazole (PROTONIX) 40 MG tablet TAKE 1 TABLET BY MOUTH EVERY DAY 90 tablet 3   triamcinolone cream (KENALOG) 0.1 % Apply 1 Application topically 3 (three) times daily. (Patient taking differently: Apply 1 Application  topically 3 (three) times daily. As needed) 30 g 1   No current facility-administered medications for this visit.    PHYSICAL EXAM: Vitals:   02/18/23 1352  BP: 126/80  Pulse: 95  Resp: 20  SpO2: 99%  Weight: 170 lb (77.1 kg)  Height: 5\' 1"  (1.549 m)   Body mass index is 32.12 kg/m.    General: Well developed, well nourished female in no apparent distress.  HEENT: AT/[place], no external lesions. Hearing intact to the spoken word Eyes: EOMI. No stare, proptosis. Conjunctiva clear and no icterus. Neck: Trachea midline, neck supple without appreciable thyromegaly or lymphadenopathy and no palpable thyroid nodule Lungs: Clear to auscultation, no wheeze. Respirations not labored Heart: S1S2, Regular in rate and rhythm. Abdomen: Soft, non tender Neurologic: Alert, oriented, normal speech, deep tendon biceps reflexes normal,  no gross focal neurological deficit Extremities: trace pedal pitting edema, no tremors of outstretched hands Skin: Warm, color good.  Psychiatric: Does not appear depressed or anxious  PERTINENT HISTORIC LABORATORY AND IMAGING STUDIES:  All pertinent laboratory results were reviewed. Please see HPI also for further details.   TSH  Date Value Ref Range Status  02/01/2023 0.81 0.35 - 5.50 uIU/mL Final  01/27/2023 0.266 (L) 0.350 - 4.500 uIU/mL Final    Comment:    Performed by a 3rd Generation assay with a functional sensitivity of <=0.01 uIU/mL. Performed at [facility], [address] [place], [place] [address]   11/03/2022 0.012 (L) 0.350 - 4.500 uIU/mL Final    Comment:    Performed by a 3rd Generation assay with a functional sensitivity of <=0.01 uIU/mL. Performed at [facility], [address], [place], [place] [address]     Latest Reference Range & Units 08/13/22 09:28  Thyrotropin Receptor Ab 0.00 - 1.75 IU/L <1.10    CLINICAL DATA:  Hypothyroid.  hypothyroidism; history of goiter   EXAM: THYROID  ULTRASOUND in 06/24/2022   TECHNIQUE: Ultrasound examination of the thyroid gland and adjacent soft tissues was performed.   COMPARISON:  US Thyroid, 01/20/2005. US Thyroid biopsy,  02/09/2005 - RIGHT inferior nodule. CT chest, 01/12/2022.   FINDINGS: Parenchymal Echotexture: Mildly heterogenous   Isthmus: 0.7 cm   Right lobe: 5.8 x 2.1 x 2.1 cm   Left lobe: 4.9 x 1.8 x 1.3 cm   _________________________________________________________   Estimated total number of nodules >/= 1 cm: 3   Number of spongiform nodules >/=  2 cm not described below (TR1): 0   Number of mixed cystic and solid nodules >/= 1.5 cm not described below (TR2): 0   _________________________________________________________   Nodule # 2:   Location: RIGHT; Inferior   Maximum size: 2.0 cm; Other 2 dimensions: 1.7 x 1.4 cm, previously 2.7 x 2.5 x 1.8 cm   Composition: solid/almost completely solid (2)   Echogenicity: hypoechoic (2)   Shape: not taller-than-wide (0)   Margins: ill-defined (0)   Echogenic foci: macrocalcifications (1)   ACR TI-RADS total points: 5.   ACR TI-RADS risk category: TR4 (4-6 points).   This nodule has demonstrated interval involution. It was previously biopsied in 02/09/2005.   Assuming a benign pathologic diagnosis, repeat sampling and/or dedicated follow-up is not recommended.   _________________________________________________________   Nodule # 3:   Location: LEFT; Inferior   Maximum size: 1.2 cm; Other 2 dimensions: 0.9 x 0.9 cm   Composition: solid/almost completely solid (2)   Echogenicity: hypoechoic (2)   Shape: not taller-than-wide (0)   Margins: ill-defined (0)   Echogenic foci: none (0)   ACR TI-RADS total points: 4.   ACR TI-RADS risk category: TR4 (4-6 points).   ACR TI-RADS recommendations:   *Given size (>/= 1 - 1.4 cm) and appearance, a follow-up ultrasound in 1 year should be considered based on TI-RADS criteria.    _________________________________________________________   Additional nodules, such as labeled # 1 (0.8 cm RIGHT inferior mixed cystic and solid, TR-2) and # 4 (1.0 cm RIGHT inferior cystic TR-1) do not meet threshold for follow-up nor biopsy per current criteria.   No cervical adenopathy or abnormal fluid collection within the imaged neck.   IMPRESSION: 1. Borderline-enlarged, multinodular thyroid consistent with a goiter. 2. 1.2 cm LEFT inferior TR-4 thyroid nodule. A follow-up ultrasound in 1 year should be considered based on TI-RADS criteria. 3. Involution of now 2.0 cm RIGHT inferior thyroid nodule. This was previously biopsied in 02/09/2005. Assuming a benign pathologic diagnosis, repeat sampling and/or dedicated follow-up is not recommended.   The above is in keeping with the ACR TI-RADS recommendations - J Am Coll Radiol 2017;14:587-595.   EXAM: THYROID SCAN AND UPTAKE - 4 AND 24 HOURS on 10/27/2022   TECHNIQUE: Following oral administration of I-123 capsule, anterior planar imaging was acquired at 24 hours. Thyroid uptake was calculated with a thyroid probe at 4-6 hours and 24 hours.   RADIOPHARMACEUTICALS:  Four hundred one uCi I-123 sodium iodide p.o.   COMPARISON:  Thyroid ultrasound 06/24/2022   FINDINGS: Mild nodularity of uptake within the thyroid gland. No cold nodules. Pyramidal lobe is evident.   4 hour I-123 uptake = 18.1% (normal 5-20%)   24 hour I-123 uptake = 36.7% (normal 10-30%)   IMPRESSION: Imaging findings and iodine uptake are suggestive of toxic multinodular goiter.       ASSESSMENT / PLAN  1. Toxic multinodular goiter   2. Subclinical hyperthyroidism     -Patient has toxic multinodular goiter.  She had radioactive iodine uptake and scan in August with 4-hour uptake of 15% and 24-hour uptake of 37% consistent with multinodular goiter.  She had negative thyrotropin  receptor antibody, negative for Graves' disease. -She underwent  RAI therapy with I-131 27.5 mCi on December 28, 2022.  Patient has normalized and her thyroid function test with normal TSH of 0.81 in November 18.   -Patient is known to have bilateral multiple thyroid nodules, last ultrasound was in April 2024, right solid hypoechoic thyroid nodule measuring 2.0 cm was slightly smaller than that was measured in ultrasound in 2006, status post FNA of this nodule in 2006 with benign cytology.  She has left thyroid nodule solid hypoechoic measuring 1.2 cm as well.  No cold nodule noted on nuclear scan.  Plan: -No indication of thyroid medication at this time. -She needs to close monitoring of thyroid function test.  Will check thyroid function test today.   Diagnoses and all orders for this visit:  Toxic multinodular goiter -     T4, free -     TSH -     T3, free  Subclinical hyperthyroidism     DISPOSITION Follow up in clinic in 2 months suggested.  All questions answered and patient verbalized understanding of the plan.  [place] Taichi Repka, MD [facility] Endocrinology [facility] [address] [place], [address] [place], [place] [address] Phone # [phone]  At least part of this note was generated using voice recognition software. Inadvertent word errors may have occurred, which were not recognized during the proofreading process.

## 2023-02-19 ENCOUNTER — Other Ambulatory Visit: Payer: Self-pay | Admitting: Endocrinology

## 2023-02-19 ENCOUNTER — Telehealth: Payer: Self-pay

## 2023-02-19 DIAGNOSIS — E0520 Thyrotoxicosis with toxic multinodular goiter without thyrotoxic crisis or storm: Secondary | ICD-10-CM

## 2023-02-19 LAB — TSH: TSH: 1 m[IU]/L (ref 0.40–4.50)

## 2023-02-19 LAB — T4, FREE: Free T4: 1 ng/dL (ref 0.8–1.8)

## 2023-02-19 LAB — T3, FREE: T3, Free: 3.2 pg/mL (ref 2.3–4.2)

## 2023-02-19 NOTE — Telephone Encounter (Signed)
-----   Message from [place] Thapa sent at 02/19/2023  8:03 AM EST ----- Please notify patient of continue to have normal thyroid function test.  No need of thyroid medication at this time.  Recheck TSH, free T4, free T3 few days prior to follow-up visit with me in February.  I have placed order, please inform the patient and have lab visit.

## 2023-02-19 NOTE — Telephone Encounter (Signed)
Patient given results as directed by MD. No further questions at this time. Patient stated she already thinks she has an appointment for labs but would double check and call back if needed.

## 2023-03-05 ENCOUNTER — Encounter: Payer: Self-pay | Admitting: Internal Medicine

## 2023-03-05 ENCOUNTER — Ambulatory Visit (INDEPENDENT_AMBULATORY_CARE_PROVIDER_SITE_OTHER): Payer: Medicare Other | Admitting: Internal Medicine

## 2023-03-05 VITALS — BP 120/80 | HR 60 | Ht 61.0 in | Wt 173.0 lb

## 2023-03-05 DIAGNOSIS — Z8739 Personal history of other diseases of the musculoskeletal system and connective tissue: Secondary | ICD-10-CM | POA: Diagnosis not present

## 2023-03-05 DIAGNOSIS — Z853 Personal history of malignant neoplasm of breast: Secondary | ICD-10-CM | POA: Diagnosis not present

## 2023-03-05 DIAGNOSIS — I10 Essential (primary) hypertension: Secondary | ICD-10-CM | POA: Diagnosis not present

## 2023-03-05 DIAGNOSIS — R002 Palpitations: Secondary | ICD-10-CM | POA: Diagnosis not present

## 2023-03-05 DIAGNOSIS — R7302 Impaired glucose tolerance (oral): Secondary | ICD-10-CM | POA: Diagnosis not present

## 2023-03-05 DIAGNOSIS — G629 Polyneuropathy, unspecified: Secondary | ICD-10-CM | POA: Diagnosis not present

## 2023-03-05 DIAGNOSIS — M7918 Myalgia, other site: Secondary | ICD-10-CM

## 2023-03-05 DIAGNOSIS — C9000 Multiple myeloma not having achieved remission: Secondary | ICD-10-CM

## 2023-03-05 MED ORDER — METHYLPREDNISOLONE ACETATE 80 MG/ML IJ SUSP
80.0000 mg | Freq: Once | INTRAMUSCULAR | Status: AC
  Administered 2023-03-05: 80 mg via INTRAMUSCULAR

## 2023-03-05 NOTE — Progress Notes (Signed)
Patient Care Team: Margaree Mackintosh, MD as PCP - General (Internal Medicine)  Visit Date: 03/05/23  Subjective:    Patient ID: Sonya Franklin , Female   DOB: [birth year], 57 y.o.    MRN: [id number]   57 y.o. Female presents today for musculoskeletal pain. She has been in significant pain and has not had the energy to do household chores. Reports having palpitations that seem to be related to pain. She is taking metoprolol for palpitations morning and evening. She is taking Revlimid for multiple myeloma. She is taking Tylenol without relief.  Past Medical History:  Diagnosis Date   Ankylosing spondylitis (HCC)    Anxiety    Arthritis    Breast cancer (HCC)    right breast   Bursitis of hip, right    Cardiac arrhythmia    Carotid stenosis 08/26/2021   GE reflux    Hiatal hernia    Hypertension    Hyperthyroidism    Ischemic bowel disease (HCC)    Multiple myeloma not having achieved remission (HCC)    Prediabetes 08/26/2021   Snoring 03/21/2014   Vitamin D deficiency      Family History  Problem Relation Age of Onset   Thyroid disease Mother    Diabetes Mother    Hypertension Mother    Hyperlipidemia Mother    High blood pressure Father    High Cholesterol Father    Colitis Brother    Goiter Maternal Grandmother    Breast cancer Maternal Aunt     Social History   Social History Narrative   Caffeine occasional drinker.   Not working/disabled.  12th grader. Divorced, one kids.       Social history: She receives disability benefits for history of breast cancer 2007 and ankylosing spondylitis.  She resides with special needs son.  She is divorced.  Non-smoker.  No alcohol consumption.       Family history: Father with history of prostate cancer.  Patient's mother was diagnosed with breast cancer at age 42.  Maternal aunt with history of breast cancer.  3 brothers.   Right handed   Caffeine occas   One level stays on this level          Review of Systems   Constitutional:  Negative for fever and malaise/fatigue.  HENT:  Negative for congestion.   Eyes:  Negative for blurred vision.  Respiratory:  Negative for cough and shortness of breath.   Cardiovascular:  Negative for chest pain, palpitations and leg swelling.  Gastrointestinal:  Negative for vomiting.  Musculoskeletal:  Positive for joint pain. Negative for back pain.  Skin:  Negative for rash.  Neurological:  Negative for loss of consciousness and headaches.        Objective:   Vitals: BP 120/80   Pulse 60   Ht 5\' 1"  (1.549 m)   Wt 173 lb (78.5 kg)   SpO2 98%   BMI 32.69 kg/m    Physical Exam Vitals and nursing note reviewed.  Constitutional:      General: She is not in acute distress.    Appearance: Normal appearance. She is not toxic-appearing.  HENT:     Head: Normocephalic and atraumatic.     Right Ear: Hearing, ear canal and external ear normal.     Left Ear: Hearing, ear canal and external ear normal.     Ears:     Comments: Cerumen obstructing bilateral TM's. Pulmonary:     Effort: Pulmonary effort is normal.  Skin:    General: Skin is warm and dry.  Neurological:     Mental Status: She is alert and oriented to person, place, and time. Mental status is at baseline.  Psychiatric:        Mood and Affect: Mood normal.        Behavior: Behavior normal.        Thought Content: Thought content normal.        Judgment: Judgment normal.       Results:   Studies obtained and personally reviewed by me:   Labs:       Component Value Date/Time   NA 141 01/27/2023 1503   K 3.7 01/27/2023 1503   CL 108 01/27/2023 1503   CO2 27 01/27/2023 1503   GLUCOSE 87 01/27/2023 1503   BUN 11 01/27/2023 1503   CREATININE 0.66 01/27/2023 1503   CREATININE 0.75 10/30/2022 1200   CALCIUM 9.7 01/27/2023 1503   PROT 8.1 01/27/2023 1503   ALBUMIN 4.5 01/27/2023 1503   AST 14 (L) 01/27/2023 1503   ALT 13 01/27/2023 1503   ALKPHOS 91 01/27/2023 1503   BILITOT 0.4  01/27/2023 1503   GFRNONAA >60 01/27/2023 1503   GFRNONAA 108 04/05/2020 1128   GFRAA 126 04/05/2020 1128     Lab Results  Component Value Date   WBC 5.7 01/27/2023   HGB 12.2 01/27/2023   HCT 36.1 01/27/2023   MCV 84.9 01/27/2023   PLT 213 01/27/2023    Lab Results  Component Value Date   CHOL 181 07/17/2021   HDL 64 07/17/2021   LDLCALC 100 (H) 07/17/2021   TRIG 76 07/17/2021   CHOLHDL 2.8 07/17/2021    Lab Results  Component Value Date   HGBA1C 5.8 (H) 10/19/2022     Lab Results  Component Value Date   TSH 1.00 02/18/2023      Assessment & Plan:   Palpitations: treated with metoprolol tartrate 12.5 mg morning and evening.   Musculoskeletal pain: administered Depo-Medrol 80 mg IM.This usually relieves pain for a few weeks  Multiple myeloma seen by Oncology. Patient had musculoskeletal pain/fibromyalgia for years before being dx with Multiple myeloma.  Remote hx of breast cancer-cured  Glucose intolerance- watches diet  I,Alexander Ruley,acting as a scribe for Margaree Mackintosh, MD.,have documented all relevant documentation on the behalf of Margaree Mackintosh, MD,as directed by  Margaree Mackintosh, MD while in the presence of Margaree Mackintosh, MD.   I, Margaree Mackintosh, MD, have reviewed all documentation for this visit. The documentation on 03/15/23 for the exam, diagnosis, procedures, and orders are all accurate and complete.

## 2023-03-07 ENCOUNTER — Other Ambulatory Visit: Payer: Self-pay | Admitting: Internal Medicine

## 2023-03-08 ENCOUNTER — Other Ambulatory Visit: Payer: Self-pay | Admitting: Physician Assistant

## 2023-03-08 ENCOUNTER — Ambulatory Visit: Payer: Medicare Other | Admitting: Internal Medicine

## 2023-03-15 ENCOUNTER — Telehealth: Payer: Self-pay | Admitting: Internal Medicine

## 2023-03-15 ENCOUNTER — Ambulatory Visit: Payer: Self-pay | Admitting: Internal Medicine

## 2023-03-15 ENCOUNTER — Encounter: Payer: Self-pay | Admitting: Internal Medicine

## 2023-03-15 ENCOUNTER — Ambulatory Visit: Payer: Medicare Other | Admitting: Internal Medicine

## 2023-03-15 MED ORDER — AZITHROMYCIN 250 MG PO TABS: ORAL_TABLET | ORAL | 0 refills | Status: AC

## 2023-03-15 NOTE — Telephone Encounter (Signed)
Scheduled appointment

## 2023-03-15 NOTE — Patient Instructions (Addendum)
Sorry you are not feeling well.  Depo-Medrol 80 mg IM given today.  This usually helps musculoskeletal pain.  Continue to watch diet with history of glucose intolerance.  Continue metoprolol 12.5 mg twice daily for palpitations.  Call if not improving within a few days.

## 2023-03-15 NOTE — Telephone Encounter (Signed)
Copied from CRM [id number]. Topic: Clinical - Medication Refill >> Mar 15, 2023 11:34 AM Lorretta Harp wrote: Most Recent Primary Care Visit:  Provider: Margaree Mackintosh  Department: [facility]  Visit Type: ACUTE  Date: 03/05/2023  Medication: Patient is requesting that Dr. Lenord Fellers call something in for her ear. She said her sinuses are bothersome as well.  Has the patient contacted their pharmacy? No (Agent: If no, request that the patient contact the pharmacy for the refill. If patient does not wish to contact the pharmacy document the reason why and proceed with request.) (Agent: If yes, when and what did the pharmacy advise?)  Is this the correct pharmacy for this prescription? Yes If no, delete pharmacy and type the correct one.  This is the patient's preferred pharmacy:  [facility] [phone] G[place] Otto, [place] - [address] [place] [address] Phone: [phone] Fax: [phone]  [facility], [place] - [address] [facility] [address] [place] [address] Phone: [phone] Fax: [phone]   Has the prescription been filled recently? No  Is the patient out of the medication? No  Has the patient been seen for an appointment in the last year OR does the patient have an upcoming appointment? Yes  Can we respond through MyChart? No  Agent: Please be advised that Rx refills may take up to 3 business days. We ask that you follow-up with your pharmacy.   Chief Complaint: sinus pain/congestion Symptoms: green sputum and ear ache Frequency: ongoing since Friday Pertinent Negatives: Patient denies fever Disposition: [] ED /[] Urgent Care (no appt availability in office) / [x] Appointment(In office/virtual)/ []  [facility]/ [] Home Care/ [] Refused Recommended Disposition /[] [facility]/ []  Follow-up with PCP Additional Notes: The patient was seen in the office Friday and she mentioned that she wasn't feeling  well because her ears were hurting.  The doctor was unable to assess her ears because "they were blocked and she mentioned sending me to ENT."  After the appointment, the patient's symptoms worsened and she realized that her sinuses were bothering her and it likely an infection.  She said that she has "sinus problems and the doctor is aware."  She does not want to be seen in office again but inquired if Dr. Lenord Fellers could send in an antibiotic.   Reason for Disposition  Earache  Answer Assessment - Initial Assessment Questions 1. LOCATION: "Where does it hurt?"      Headache  2. ONSET: "When did the sinus pain start?"  (e.g., hours, days)      Friday evening 3. SEVERITY: "How bad is the pain?"   (Scale 1-10; mild, moderate or severe)   - MILD (1-3): doesn't interfere with normal activities    - MODERATE (4-7): interferes with normal activities (e.g., work or school) or awakens from sleep   - SEVERE (8-10): excruciating pain and patient unable to do any normal activities        8/10 4. RECURRENT SYMPTOM: "Have you ever had sinus problems before?" If Yes, ask: "When was the last time?" and "What happened that time?"      Yes - prescribed antibiotic 5. NASAL CONGESTION: "Is the nose blocked?" If Yes, ask: "Can you open it or must you breathe through your mouth?"     Yes  6. NASAL DISCHARGE: "Do you have discharge from your nose?" If so ask, "What color?"     Green discharge  7. FEVER: "Do you have a fever?"  If Yes, ask: "What is it, how was it measured, and when did it start?"      No  8. OTHER SYMPTOMS: "Do you have any other symptoms?" (e.g., sore throat, cough, earache, difficulty breathing)     Ears Sore throat Drainage  Protocols used: Sinus Pain or Congestion-A-AH

## 2023-03-15 NOTE — Telephone Encounter (Signed)
Patient is requesting Z-pak. Cannot get her this afternoon as she is at a doctor's appt with her. Son. Office iwill be closed next 2 days. I am sending in [place] Z-pak which she generally takes with no side effects. MJB, MD

## 2023-03-16 ENCOUNTER — Other Ambulatory Visit: Payer: Self-pay | Admitting: *Deleted

## 2023-03-16 DIAGNOSIS — C9000 Multiple myeloma not having achieved remission: Secondary | ICD-10-CM

## 2023-03-16 MED ORDER — LENALIDOMIDE 10 MG PO CAPS: 10.0000 mg | ORAL_CAPSULE | Freq: Every day | ORAL | 0 refills | Status: DC

## 2023-03-16 NOTE — Telephone Encounter (Signed)
Refilled Revlimid per OV note 01/27/23 "on maintenance therapy with Revlimid 10 mg p.o. daily 21 days on and 7 days off"

## 2023-04-14 ENCOUNTER — Other Ambulatory Visit: Payer: Self-pay | Admitting: Hematology and Oncology

## 2023-04-14 DIAGNOSIS — C9000 Multiple myeloma not having achieved remission: Secondary | ICD-10-CM

## 2023-04-15 ENCOUNTER — Other Ambulatory Visit: Payer: Self-pay | Admitting: *Deleted

## 2023-04-15 DIAGNOSIS — C9000 Multiple myeloma not having achieved remission: Secondary | ICD-10-CM

## 2023-04-15 MED ORDER — LENALIDOMIDE 10 MG PO CAPS: 10.0000 mg | ORAL_CAPSULE | Freq: Every day | ORAL | 0 refills | Status: DC

## 2023-04-21 ENCOUNTER — Encounter: Payer: Self-pay | Admitting: Endocrinology

## 2023-04-21 ENCOUNTER — Ambulatory Visit: Payer: Medicare Other | Admitting: Endocrinology

## 2023-04-21 VITALS — BP 120/78 | HR 68 | Ht 61.0 in | Wt 171.0 lb

## 2023-04-21 DIAGNOSIS — E0590 Thyrotoxicosis, unspecified without thyrotoxic crisis or storm: Secondary | ICD-10-CM

## 2023-04-21 DIAGNOSIS — E0520 Thyrotoxicosis with toxic multinodular goiter without thyrotoxic crisis or storm: Secondary | ICD-10-CM | POA: Diagnosis not present

## 2023-04-21 NOTE — Progress Notes (Addendum)
 Outpatient Endocrinology Note Janay Canan, MD  04/21/23  Patient's Name: Sonya Franklin    DOB: [birth year]    MRN: [id number]  REASON OF VISIT: Follow-up for thyroid  nodules / subclinical hyperthyroidism  PCP: Perri Ronal PARAS, MD  HISTORY OF PRESENT ILLNESS:   Sonya Franklin is a 58 y.o. old female with past medical history as listed below is presented for a follow up of toxic thyroid  nodules /subclinical hypothyroidism.  Pertinent Thyroid  History:  Patient is known to have multiple bilateral thyroid  nodules from 2006.  Patient has intermittently mildly low TSH with normal free T4 and free T3 consistent with subclinical hyperthyroidism at least from 2010. At that time she had symptoms of shakiness and palpitations.  She had intermittently low TSH over the years.  Patient was seen in endocrinology clinic in 2020 and May 2024 by Dr. Von.  She had thyroid  scan in December 2010 which showed thyroid  enlargement especially on the right side and heterogenous uptake and scattered area of increased and decreased uptake along with 24-hour uptake of 34.7%.   Ultrasound thyroid  in April 2024 : Borderline enlarged multinodular thyroid  consistent with goiter.  Bilateral thyroid  nodules left inferior thyroid  nodule solid, hypoechoic measuring 1.2 cm and right inferior thyroid  nodule solid, hypoechoic, with echogenic foci measuring 2.0 cm, it was previously measured 2.7 cm in 2006.  Patient had ultrasound thyroid  in November 2006 with fine-needle aspiration in June 2007 of right thyroid  nodule with benign cytology / most consistent with a benign colloid nodule.  Family history of goiter and autoimmune thyroid  disease. She had negative thyrotropin receptor antibody.  # She had radioactive iodine uptake and scan in August 2024 with 4-hour uptake of 15% and 24-hour uptake of 37% consistent with multinodular goiter. Patient had RAI I-131 therapy with 27.5 mCi on December 28, 2022 for subclinical  hyperthyroidism.  # Patient was diagnosed with multiple myeloma and currently on chemotherapy following with oncology.  Interval history Patient complains of jitteriness feeling, may be palpitation, has been on metoprolol  from primary care provider.  Denies heat intolerance or increased sweating.  No change in bowel habit.  She has not been on thyroid  medication.  She denies any neck discomfort or compressive symptoms.  She had normal thyroid  function test in November and December as noted below.    REVIEW OF SYSTEMS:  As per history of present illness.   PAST MEDICAL HISTORY: Past Medical History:  Diagnosis Date   Ankylosing spondylitis (HCC)    Anxiety    Arthritis    Breast cancer (HCC)    right breast   Bursitis of hip, right    Cardiac arrhythmia    Carotid stenosis 08/26/2021   GE reflux    Hiatal hernia    Hypertension    Hyperthyroidism    Ischemic bowel disease (HCC)    Multiple myeloma not having achieved remission (HCC)    Prediabetes 08/26/2021   Snoring 03/21/2014   Vitamin D  deficiency     PAST SURGICAL HISTORY: Past Surgical History:  Procedure Laterality Date   ABDOMINAL HYSTERECTOMY     BREAST LUMPECTOMY     right   MASTECTOMY     double    ALLERGIES: Allergies  Allergen Reactions   Shellfish Allergy Anaphylaxis   Blue Dyes (Parenteral)    Hydrochlorothiazide  Itching    FAMILY HISTORY:  Family History  Problem Relation Age of Onset   Thyroid  disease Mother    Diabetes Mother    Hypertension Mother  Hyperlipidemia Mother    High blood pressure Father    High Cholesterol Father    Colitis Brother    Goiter Maternal Grandmother    Breast cancer Maternal Aunt     SOCIAL HISTORY: Social History   Socioeconomic History   Marital status: Divorced    Spouse name: Not on file   Number of children: 1   Years of education: Not on file   Highest education level: Not on file  Occupational History    Employer: UNEMPLOYED  Tobacco Use    Smoking status: Never   Smokeless tobacco: Never  Vaping Use   Vaping status: Never Used  Substance and Sexual Activity   Alcohol use: No   Drug use: No   Sexual activity: Yes  Other Topics Concern   Not on file  Social History Narrative   Caffeine occasional drinker.   Not working/disabled.  12th grader. Divorced, one kids.       Social history: She receives disability benefits for history of breast cancer 2007 and ankylosing spondylitis.  She resides with special needs son.  She is divorced.  Non-smoker.  No alcohol consumption.       Family history: Father with history of prostate cancer.  Patient's mother was diagnosed with breast cancer at age 74.  Maternal aunt with history of breast cancer.  3 brothers.   Right handed   Caffeine occas   One level stays on this level       Social Drivers of Health   Financial Resource Strain: Low Risk  (08/26/2021)   Overall Financial Resource Strain (CARDIA)    Difficulty of Paying Living Expenses: Not hard at all  Food Insecurity: No Food Insecurity (05/25/2022)   Hunger Vital Sign    Worried About Running Out of Food in the Last Year: Never true    Ran Out of Food in the Last Year: Never true  Transportation Needs: No Transportation Needs (08/26/2021)   PRAPARE - Administrator, Civil Service (Medical): No    Lack of Transportation (Non-Medical): No  Physical Activity: Inactive (08/26/2021)   Exercise Vital Sign    Days of Exercise per Week: 0 days    Minutes of Exercise per Session: 0 min  Stress: Not on file  Social Connections: Not on file    MEDICATIONS:  Current Outpatient Medications  Medication Sig Dispense Refill   acetaminophen  (TYLENOL ) 500 MG tablet Take 500 mg by mouth every 6 (six) hours as needed for moderate pain.     acyclovir  (ZOVIRAX ) 400 MG tablet TAKE 1 TABLET BY MOUTH TWICE A DAY 180 tablet 1   ALPRAZolam  (XANAX ) 0.5 MG tablet Take 1 tablet (0.5 mg total) by mouth 3 (three) times daily as needed  for anxiety. 60 tablet 2   amLODipine  (NORVASC ) 5 MG tablet TAKE 1 TABLET (5 MG TOTAL) BY MOUTH DAILY. 90 tablet 2   aspirin EC 81 MG tablet Take 81 mg by mouth daily. Swallow whole.     furosemide  (LASIX ) 20 MG tablet TAKE 1 TABLET BY MOUTH EVERY DAY AS NEEDED 90 tablet 0   hydrochlorothiazide  (HYDRODIURIL ) 25 MG tablet TAKE 1 TABLET (25 MG TOTAL) BY MOUTH DAILY. 90 tablet 3   HYDROcodone -acetaminophen  (NORCO) 10-325 MG tablet One half to one tab by mouth with food every 8-12 hours for pain. 15 tablet 0   hyoscyamine  (ANASPAZ ) 0.125 MG TBDP disintergrating tablet Place 1 tablet (0.125 mg total) under the tongue every 4 (four) hours as needed for  cramping. 30 tablet 0   KLOR-CON  M20 20 MEQ tablet TAKE 2 TABLETS BY MOUTH EVERY DAY 180 tablet 1   lenalidomide  (REVLIMID ) 10 MG capsule Take 1 capsule (10 mg total) by mouth daily. [employer] Auth #[id number]   Date Obtained 04/15/2023  Take 1 capsule daily for 21 days then none for 7 days 21 capsule 0   lidocaine  (LIDODERM ) 5 % Place 1 patch onto the skin daily. Remove & Discard patch within 12 hours or as directed by MD 30 patch 1   metoprolol  tartrate (LOPRESSOR ) 25 MG tablet ONE HALF TAB BY MOUTH UP TO TWICE DAILY IF NEEDED FOR PALPITATIONS 90 tablet 1   nystatin  (MYCOSTATIN ) 100000 UNIT/ML suspension Take 5 mLs (500,000 Units total) by mouth 4 (four) times daily. 60 mL 0   olopatadine  (PATANOL) 0.1 % ophthalmic solution Place 1 drop into both eyes 2 (two) times daily as needed. 5 mL PRN   pantoprazole  (PROTONIX ) 40 MG tablet TAKE 1 TABLET BY MOUTH EVERY DAY 90 tablet 3   triamcinolone  cream (KENALOG ) 0.1 % Apply 1 Application topically 3 (three) times daily. (Patient taking differently: Apply 1 Application topically 3 (three) times daily. As needed) 30 g 1   No current facility-administered medications for this visit.    PHYSICAL EXAM: Vitals:   04/21/23 1400  BP: 120/78  Pulse: 68  SpO2: 97%  Weight: 171 lb (77.6 kg)  Height: 5' 1 (1.549 m)     Body mass index is 32.31 kg/m.    General: Well developed, well nourished female in no apparent distress.  HEENT: AT/[place], no external lesions. Hearing intact to the spoken word Eyes: EOMI. No stare, proptosis. Conjunctiva clear and no icterus. Neck: Trachea midline, neck supple without appreciable thyromegaly or lymphadenopathy and no palpable thyroid  nodule Lungs: Clear to auscultation, no wheeze. Respirations not labored Heart: S1S2, Regular in rate and rhythm. Abdomen: Soft, non tender Neurologic: Alert, oriented, normal speech, deep tendon biceps reflexes normal,  no gross focal neurological deficit Extremities: trace pedal pitting edema, no tremors of outstretched hands Skin: Warm, color good.  Psychiatric: Does not appear depressed or anxious  PERTINENT HISTORIC LABORATORY AND IMAGING STUDIES:  All pertinent laboratory results were reviewed. Please see HPI also for further details.   TSH  Date Value Ref Range Status  02/18/2023 1.00 0.40 - 4.50 mIU/L Final  02/01/2023 0.81 0.35 - 5.50 uIU/mL Final  01/27/2023 0.266 (L) 0.350 - 4.500 uIU/mL Final    Comment:    Performed by a 3rd Generation assay with a functional sensitivity of <=0.01 uIU/mL. Performed at [facility], [address], [place], [place] [address]     Latest Reference Range & Units 08/13/22 09:28  Thyrotropin Receptor Ab 0.00 - 1.75 IU/L <1.10    CLINICAL DATA:  Hypothyroid.  hypothyroidism; history of goiter   EXAM: THYROID  ULTRASOUND in 06/24/2022   TECHNIQUE: Ultrasound examination of the thyroid  gland and adjacent soft tissues was performed.   COMPARISON:  US  Thyroid , 01/20/2005. US  Thyroid  biopsy, 02/09/2005 - RIGHT inferior nodule. CT chest, 01/12/2022.   FINDINGS: Parenchymal Echotexture: Mildly heterogenous   Isthmus: 0.7 cm   Right lobe: 5.8 x 2.1 x 2.1 cm   Left lobe: 4.9 x 1.8 x 1.3 cm   _________________________________________________________   Estimated total  number of nodules >/= 1 cm: 3   Number of spongiform nodules >/=  2 cm not described below (TR1): 0   Number of mixed cystic and solid nodules >/= 1.5 cm not described below (  TR2): 0   _________________________________________________________   Nodule # 2:   Location: RIGHT; Inferior   Maximum size: 2.0 cm; Other 2 dimensions: 1.7 x 1.4 cm, previously 2.7 x 2.5 x 1.8 cm   Composition: solid/almost completely solid (2)   Echogenicity: hypoechoic (2)   Shape: not taller-than-wide (0)   Margins: ill-defined (0)   Echogenic foci: macrocalcifications (1)   ACR TI-RADS total points: 5.   ACR TI-RADS risk category: TR4 (4-6 points).   This nodule has demonstrated interval involution. It was previously biopsied in 02/09/2005.   Assuming a benign pathologic diagnosis, repeat sampling and/or dedicated follow-up is not recommended.   _________________________________________________________   Nodule # 3:   Location: LEFT; Inferior   Maximum size: 1.2 cm; Other 2 dimensions: 0.9 x 0.9 cm   Composition: solid/almost completely solid (2)   Echogenicity: hypoechoic (2)   Shape: not taller-than-wide (0)   Margins: ill-defined (0)   Echogenic foci: none (0)   ACR TI-RADS total points: 4.   ACR TI-RADS risk category: TR4 (4-6 points).   ACR TI-RADS recommendations:   *Given size (>/= 1 - 1.4 cm) and appearance, a follow-up ultrasound in 1 year should be considered based on TI-RADS criteria.   _________________________________________________________   Additional nodules, such as labeled # 1 (0.8 cm RIGHT inferior mixed cystic and solid, TR-2) and # 4 (1.0 cm RIGHT inferior cystic TR-1) do not meet threshold for follow-up nor biopsy per current criteria.   No cervical adenopathy or abnormal fluid collection within the imaged neck.   IMPRESSION: 1. Borderline-enlarged, multinodular thyroid  consistent with a goiter. 2. 1.2 cm LEFT inferior TR-4 thyroid  nodule. A  follow-up ultrasound in 1 year should be considered based on TI-RADS criteria. 3. Involution of now 2.0 cm RIGHT inferior thyroid  nodule. This was previously biopsied in 02/09/2005. Assuming a benign pathologic diagnosis, repeat sampling and/or dedicated follow-up is not recommended.   The above is in keeping with the ACR TI-RADS recommendations - J Am Coll Radiol 2017;14:587-595.   EXAM: THYROID  SCAN AND UPTAKE - 4 AND 24 HOURS on 10/27/2022   TECHNIQUE: Following oral administration of I-123 capsule, anterior planar imaging was acquired at 24 hours. Thyroid  uptake was calculated with a thyroid  probe at 4-6 hours and 24 hours.   RADIOPHARMACEUTICALS:  Four hundred one uCi I-123 sodium iodide p.o.   COMPARISON:  Thyroid  ultrasound 06/24/2022   FINDINGS: Mild nodularity of uptake within the thyroid  gland. No cold nodules. Pyramidal lobe is evident.   4 hour I-123 uptake = 18.1% (normal 5-20%)   24 hour I-123 uptake = 36.7% (normal 10-30%)   IMPRESSION: Imaging findings and iodine uptake are suggestive of toxic multinodular goiter.       ASSESSMENT / PLAN  1. Subclinical hyperthyroidism   2. Toxic multinodular goiter    -Patient has toxic multinodular goiter.  She had radioactive iodine uptake and scan in August with 4-hour uptake of 15% and 24-hour uptake of 37% consistent with multinodular goiter.  She had negative thyrotropin receptor antibody, negative for Graves' disease. -She underwent RAI therapy with I-131 27.5 mCi on December 28, 2022.  Patient has normalized and her thyroid  function test with normal TSH of 0.81 in November and December 2024.  She has not been on thyroid  medication.  -Patient is known to have bilateral multiple thyroid  nodules, last ultrasound was in April 2024, right solid hypoechoic thyroid  nodule measuring 2.0 cm was slightly smaller than that was measured in ultrasound in 2006, status post FNA of this  nodule in 2006 with benign cytology.  She has  left thyroid  nodule solid hypoechoic measuring 1.2 cm as well.  No cold nodule noted on nuclear scan.  Plan: -Patient is overall euthyroid.  She complains of possible occasional palpitation and jitteriness otherwise no hyperthyroid symptoms. -She needs to close monitoring of thyroid  function test.  Discussed that after radioactive iodine treatment can usually progress to hypothyroid, in minimal case euthyroid or hyperthyroid.  -Thyroid  function test today and prior to follow-up visit in 3 months.  - Sonya Franklin was seen today for follow-up.  Diagnoses and all orders for this visit:  Subclinical hyperthyroidism -     T3, free -     T4, free -     TSH  Toxic multinodular goiter -     T3, free -     T4, free -     TSH   Normal thyroid  function test.  No need of medication at this time.  Will continue to monitor with lab test.  Will check thyroid  function test in 3 months prior to follow-up visit.   Latest Reference Range & Units 04/21/23 14:27  TSH 0.40 - 4.50 mIU/L 0.43  Triiodothyronine,Free,Serum 2.3 - 4.2 pg/mL 3.1  T4,Free(Direct) 0.8 - 1.8 ng/dL 1.2     DISPOSITION Follow up in clinic in 3 months suggested.  All questions answered and patient verbalized understanding of the plan.  Maysen Bonsignore, MD [facility] Endocrinology [facility] [address] [place], [address] [place], [place] [address] Phone # [phone]  At least part of this note was generated using voice recognition software. Inadvertent word errors may have occurred, which were not recognized during the proofreading process.

## 2023-04-22 ENCOUNTER — Encounter: Payer: Self-pay | Admitting: Endocrinology

## 2023-04-22 LAB — T3, FREE: T3, Free: 3.1 pg/mL (ref 2.3–4.2)

## 2023-04-22 LAB — TSH: TSH: 0.43 m[IU]/L (ref 0.40–4.50)

## 2023-04-22 LAB — T4, FREE: Free T4: 1.2 ng/dL (ref 0.8–1.8)

## 2023-04-22 NOTE — Addendum Note (Signed)
 Addended by: Fard Borunda, [place] on: 04/22/2023 01:07 PM   Modules accepted: Orders

## 2023-05-03 ENCOUNTER — Encounter: Payer: Self-pay | Admitting: Internal Medicine

## 2023-05-03 ENCOUNTER — Ambulatory Visit: Payer: Medicare Other | Admitting: Internal Medicine

## 2023-05-03 VITALS — BP 130/88 | HR 70 | Ht 61.0 in | Wt 170.0 lb

## 2023-05-03 DIAGNOSIS — Z8739 Personal history of other diseases of the musculoskeletal system and connective tissue: Secondary | ICD-10-CM

## 2023-05-03 DIAGNOSIS — H6123 Impacted cerumen, bilateral: Secondary | ICD-10-CM | POA: Diagnosis not present

## 2023-05-03 DIAGNOSIS — R002 Palpitations: Secondary | ICD-10-CM | POA: Diagnosis not present

## 2023-05-03 DIAGNOSIS — C9000 Multiple myeloma not having achieved remission: Secondary | ICD-10-CM

## 2023-05-03 DIAGNOSIS — K580 Irritable bowel syndrome with diarrhea: Secondary | ICD-10-CM

## 2023-05-03 DIAGNOSIS — M7918 Myalgia, other site: Secondary | ICD-10-CM

## 2023-05-03 DIAGNOSIS — T8549XA Other mechanical complication of breast prosthesis and implant, initial encounter: Secondary | ICD-10-CM

## 2023-05-03 DIAGNOSIS — Z853 Personal history of malignant neoplasm of breast: Secondary | ICD-10-CM

## 2023-05-03 MED ORDER — DICYCLOMINE HCL 20 MG PO TABS: ORAL_TABLET | ORAL | 1 refills | Status: AC

## 2023-05-03 MED ORDER — METHYLPREDNISOLONE ACETATE 80 MG/ML IJ SUSP
80.0000 mg | Freq: Once | INTRAMUSCULAR | Status: AC
  Administered 2023-05-03: 80 mg via INTRAMUSCULAR

## 2023-05-03 NOTE — Progress Notes (Signed)
 Patient Care Team: Margaree Mackintosh, MD as PCP - General (Internal Medicine)  Visit Date: 05/03/23  Subjective:   Chief Complaint  Patient presents with   Muscle Pain   Breast Problem    Patient c/o left breast implant has ruptured.    Tachycardia   Patient Sonya Franklin,Female DOB:16-May-1965,57 y.o. BJY:[id number]   58 y.o. Female presents today for an office visit with Muscle Pain, Breast Problem, and Palpitations. Presented to receive 80 mg Depo-Medrol IM for Muscle Pain.   She also reports that her left breast implant has ruptured. Originally performed by Dr. Stephens November.   Also notes that she is still experiencing palpitations despite taking 25 mg Metoprolol BID. Notes that her endocrinologist, [place] Thapa suggested these may be related to her 10 mg Revlimid as her TSH, Free T3, & Free T4 were WNL.  Mentions she would like to have her ears evaluated as she is experiencing tinnitus and feeling like her ears are full.   States she is UTD for eye exams October 2024 at [facility].  Declines Flu and PNA vaccine.  Past Medical History:  Diagnosis Date   Ankylosing spondylitis (HCC)    Anxiety    Arthritis    Breast cancer (HCC)    right breast   Bursitis of hip, right    Cardiac arrhythmia    Carotid stenosis 08/26/2021   GE reflux    Hiatal hernia    Hypertension    Hyperthyroidism    Ischemic bowel disease (HCC)    Multiple myeloma not having achieved remission (HCC)    Prediabetes 08/26/2021   Snoring 03/21/2014   Vitamin D deficiency     Allergies  Allergen Reactions   Shellfish Allergy Anaphylaxis   Blue Dyes (Parenteral)    Hydrochlorothiazide Itching    Family History  Problem Relation Age of Onset   Thyroid disease Mother    Diabetes Mother    Hypertension Mother    Hyperlipidemia Mother    High blood pressure Father    High Cholesterol Father    Colitis Brother    Goiter Maternal Grandmother    Breast cancer Maternal Aunt    Social  History   Social History Narrative   Caffeine occasional drinker.   Not working/disabled.  12th grader. Divorced, one kids.       Social history: She receives disability benefits for history of breast cancer 2007 and ankylosing spondylitis.  She resides with special needs son.  She is divorced.  Non-smoker.  No alcohol consumption.       Family history: Father with history of prostate cancer.  Patient's mother was diagnosed with breast cancer at age 12.  Maternal aunt with history of breast cancer.  3 brothers.   Right handed   Caffeine occas   One level stays on this level       Review of Systems  HENT:  Positive for tinnitus.   Cardiovascular:  Positive for palpitations.  Musculoskeletal:  Positive for myalgias (/Arthralgias).  All other systems reviewed and are negative.    Objective:  Vitals: BP 130/88   Pulse 70   Ht 5\' 1"  (1.549 m)   Wt 170 lb (77.1 kg)   SpO2 97%   BMI 32.12 kg/m   Physical Exam Vitals and nursing note reviewed.  Constitutional:      General: She is not in acute distress.    Appearance: Normal appearance. She is not toxic-appearing.  HENT:     Head: Normocephalic and  atraumatic.     Right Ear: Tympanic membrane, ear canal and external ear normal. There is impacted cerumen.     Left Ear: Tympanic membrane, ear canal and external ear normal. There is impacted cerumen.     Mouth/Throat:     Pharynx: Oropharynx is clear. No oropharyngeal exudate or posterior oropharyngeal erythema.  Cardiovascular:     Rate and Rhythm: Normal rate and regular rhythm. No extrasystoles are present.    Pulses: Normal pulses.     Heart sounds: Normal heart sounds. No murmur heard.    No friction rub. No gallop.  Pulmonary:     Effort: Pulmonary effort is normal. No respiratory distress.     Breath sounds: Normal breath sounds. No wheezing or rales.  Skin:    General: Skin is warm and dry.  Neurological:     Mental Status: She is alert and oriented to person, place,  and time. Mental status is at baseline.  Psychiatric:        Mood and Affect: Mood normal.        Behavior: Behavior normal.        Thought Content: Thought content normal.        Judgment: Judgment normal.     Results:  Studies Obtained And Personally Reviewed By Me:  Diabetic Foot Exam - Simple   Simple Foot Form Diabetic Foot exam was performed with the following findings: Yes 05/03/2023  4:39 PM  Visual Inspection No deformities, no ulcerations, no other skin breakdown bilaterally: Yes Sensation Testing Intact to touch and monofilament testing bilaterally: Yes Pulse Check Posterior Tibialis and Dorsalis pulse intact bilaterally: Yes Comments    Labs:     Component Value Date/Time   NA 141 01/27/2023 1503   K 3.7 01/27/2023 1503   CL 108 01/27/2023 1503   CO2 27 01/27/2023 1503   GLUCOSE 87 01/27/2023 1503   BUN 11 01/27/2023 1503   CREATININE 0.66 01/27/2023 1503   CREATININE 0.75 10/30/2022 1200   CALCIUM 9.7 01/27/2023 1503   PROT 8.1 01/27/2023 1503   ALBUMIN 4.5 01/27/2023 1503   AST 14 (L) 01/27/2023 1503   ALT 13 01/27/2023 1503   ALKPHOS 91 01/27/2023 1503   BILITOT 0.4 01/27/2023 1503   GFRNONAA >60 01/27/2023 1503   GFRNONAA 108 04/05/2020 1128   GFRAA 126 04/05/2020 1128    Lab Results  Component Value Date   WBC 5.7 01/27/2023   HGB 12.2 01/27/2023   HCT 36.1 01/27/2023   MCV 84.9 01/27/2023   PLT 213 01/27/2023   Lab Results  Component Value Date   CHOL 181 07/17/2021   HDL 64 07/17/2021   LDLCALC 100 (H) 07/17/2021   TRIG 76 07/17/2021   CHOLHDL 2.8 07/17/2021   Lab Results  Component Value Date   HGBA1C 5.8 (H) 10/19/2022    Lab Results  Component Value Date   TSH 0.43 04/21/2023   Assessment & Plan:   Musculoskeletal Pain; Hx of Ankylosing Spondylitis; Multiple Myeloma: given 80 mg Depo-Medrol IM for musculoskeletal pain. Myultiple Myeloma followed by Oncology, contributing to musculoskeletal pain.   Hx of Breast Cancer;  Initial Encounter for Deflation of Breast Implant, Left: has bilateral breast implants, of which the left has recently ruptured - originally performed by Dr. Stephens November who has retired, will try to refer to [employer], DO.   Tachycardia: taking 25 mg Metoprolol BID. Reports still experiencing palpitations. Notes that her endocrinologist, [place] Thapa suggested these may be related to her 10 mg Revlimid  as her 04/21/23 TSH, T3, T4 were WNL.   Excessively Impacted Cerumen, Bilaterally: reports feeling ear fullness and tinnitus. Will try to refer to ENT for ear lavage and evaluation.    Health Maintenance: pt reports UTD on eye exams since October 2024 at [facility] - will obtain records. Declines Flu and PNA vaccine.   Sending in 20 mg Bentyl - take 1 tablet before meals or at bedtime as needed for diarrhea - for Irritable Bowel Syndrome (IBS).   I,Emily Lagle,acting as a Neurosurgeon for Margaree Mackintosh, MD.,have documented all relevant documentation on the behalf of Margaree Mackintosh, MD,as directed by  Margaree Mackintosh, MD while in the presence of Margaree Mackintosh, MD.   I, Margaree Mackintosh, MD, have reviewed all documentation for this visit. The documentation on 05/13/23 for the exam, diagnosis, procedures, and orders are all accurate and complete.

## 2023-05-10 ENCOUNTER — Telehealth: Payer: Self-pay | Admitting: Hematology and Oncology

## 2023-05-11 ENCOUNTER — Other Ambulatory Visit: Payer: Self-pay | Admitting: Physician Assistant

## 2023-05-11 DIAGNOSIS — C9000 Multiple myeloma not having achieved remission: Secondary | ICD-10-CM

## 2023-05-12 ENCOUNTER — Other Ambulatory Visit: Payer: Self-pay | Admitting: *Deleted

## 2023-05-12 DIAGNOSIS — C9000 Multiple myeloma not having achieved remission: Secondary | ICD-10-CM

## 2023-05-12 MED ORDER — LENALIDOMIDE 10 MG PO CAPS
10.0000 mg | ORAL_CAPSULE | Freq: Every day | ORAL | 0 refills | Status: DC
Start: 2023-05-12 — End: 2023-06-11

## 2023-05-13 ENCOUNTER — Encounter: Payer: Self-pay | Admitting: Internal Medicine

## 2023-05-13 NOTE — Patient Instructions (Addendum)
 You have your follow-up visit here in March.  We will contact plastic surgeon regarding deflation of your left breast implant.  Depo-Medrol given today at your request for musculoskeletal pain.  Referring to ENT to have cerumen removed.  Continue metoprolol twice daily for tachycardia/palpitations.  Continue follow-up for multiple myeloma by oncology.  Eye exam report requested.  Patient declines flu and pneumonia vaccines.

## 2023-05-14 ENCOUNTER — Inpatient Hospital Stay: Payer: Medicare Other | Attending: Physician Assistant

## 2023-05-14 ENCOUNTER — Inpatient Hospital Stay: Payer: Medicare Other | Admitting: Physician Assistant

## 2023-05-14 VITALS — BP 138/77 | HR 60 | Temp 97.9°F | Resp 13 | Wt 169.8 lb

## 2023-05-14 DIAGNOSIS — D649 Anemia, unspecified: Secondary | ICD-10-CM | POA: Diagnosis not present

## 2023-05-14 DIAGNOSIS — C9000 Multiple myeloma not having achieved remission: Secondary | ICD-10-CM | POA: Insufficient documentation

## 2023-05-14 DIAGNOSIS — Z7982 Long term (current) use of aspirin: Secondary | ICD-10-CM | POA: Diagnosis not present

## 2023-05-14 DIAGNOSIS — Z7961 Long term (current) use of immunomodulator: Secondary | ICD-10-CM | POA: Insufficient documentation

## 2023-05-14 DIAGNOSIS — Z79624 Long term (current) use of inhibitors of nucleotide synthesis: Secondary | ICD-10-CM | POA: Diagnosis not present

## 2023-05-14 DIAGNOSIS — Z79899 Other long term (current) drug therapy: Secondary | ICD-10-CM | POA: Diagnosis not present

## 2023-05-14 LAB — CMP (CANCER CENTER ONLY)
ALT: 14 U/L (ref 0–44)
AST: 11 U/L — ABNORMAL LOW (ref 15–41)
Albumin: 4.1 g/dL (ref 3.5–5.0)
Alkaline Phosphatase: 88 U/L (ref 38–126)
Anion gap: 4 — ABNORMAL LOW (ref 5–15)
BUN: 9 mg/dL (ref 6–20)
CO2: 29 mmol/L (ref 22–32)
Calcium: 9 mg/dL (ref 8.9–10.3)
Chloride: 107 mmol/L (ref 98–111)
Creatinine: 0.83 mg/dL (ref 0.44–1.00)
GFR, Estimated: >60 mL/min (ref >60)
Glucose, Bld: 101 mg/dL — ABNORMAL HIGH (ref 70–99)
Potassium: 3.7 mmol/L (ref 3.5–5.1)
Sodium: 140 mmol/L (ref 135–145)
Total Bilirubin: 0.4 mg/dL (ref 0.0–1.2)
Total Protein: 7.5 g/dL (ref 6.5–8.1)

## 2023-05-14 LAB — CBC WITH DIFFERENTIAL (CANCER CENTER ONLY)
Abs Immature Granulocytes: 0.01 10*3/uL (ref 0.00–0.07)
Basophils Absolute: 0 10*3/uL (ref 0.0–0.1)
Basophils Relative: 1 %
Eosinophils Absolute: 0.3 10*3/uL (ref 0.0–0.5)
Eosinophils Relative: 7 %
HCT: 35.7 % — ABNORMAL LOW (ref 36.0–46.0)
Hemoglobin: 11.6 g/dL — ABNORMAL LOW (ref 12.0–15.0)
Immature Granulocytes: 0 %
Lymphocytes Relative: 26 %
Lymphs Abs: 1 10*3/uL (ref 0.7–4.0)
MCH: 29.4 pg (ref 26.0–34.0)
MCHC: 32.5 g/dL (ref 30.0–36.0)
MCV: 90.4 fL (ref 80.0–100.0)
Monocytes Absolute: 0.6 10*3/uL (ref 0.1–1.0)
Monocytes Relative: 14 %
Neutro Abs: 2 10*3/uL (ref 1.7–7.7)
Neutrophils Relative %: 52 %
Platelet Count: 192 10*3/uL (ref 150–400)
RBC: 3.95 MIL/uL (ref 3.87–5.11)
RDW: 14.1 % (ref 11.5–15.5)
WBC Count: 4 10*3/uL (ref 4.0–10.5)
nRBC: 0 % (ref 0.0–0.2)

## 2023-05-14 NOTE — Progress Notes (Signed)
 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  05/05/2022: Cycle 1 Day 1 of VRD therapy 05/27/2022: Cycle 2 Day 1 of VRD therapy 06/17/2022: Cycle 3 Day 1 of VRD therapy 07/08/2022: Cycle 4 Day 1 of VRD therapy 07/29/2022: Cycle 5 Day 1 of VRD therapy 08/19/2022: Cycle 6 Day 1 of VRD therapy 09/09/2022: Cycle 7 Day 1 of VRD therapy 09/30/2022: Cycle 8 Day 1 of VRD therapy 10/23/2022: Started maintenance Revlimid therapy  Interval History:  Sonya Franklin 58 y.o. female with medical history significant for recently diagnosed multiple myeloma with plasmacytoma who presents for a follow up visit. The patient's last visit was on 01/27/2023. She presents today for a follow up for maintenance Revlimid therapy.   On exam today Sonya Franklin reports she has been well overall in the interim since her last visit.  We expressed that she has been lost to follow-up and she was concerned about this as well.  She reports she is faithfully taking her Revlimid 10 mg p.o. daily.  She is currently 1 day away from her week off.  She reports it can still make her somewhat sluggish.  She tends to stay in bed from 10 PM until 10 AM and then is active the other 12 hours a day.  She reports her energy fluctuates.  She reports her appetite has been good.  She notes otherwise she has had no major changes in her health.  She has had no fevers, chills, sweats and no symptoms concerning for viral infection..  She denies shortness of breath, chest pain or cough or nausea, vomiting, diarrhea.  She is willing and able to proceed with Revlimid maintenance therapy at  this time. She has no other complaints. Rest of the 10 point ROS is below.   MEDICAL HISTORY:  Past Medical History:  Diagnosis Date   Ankylosing spondylitis (HCC)    Anxiety    Arthritis    Breast cancer (HCC)    right breast   Bursitis of hip, right    Cardiac arrhythmia    Carotid stenosis 08/26/2021   GE reflux    Hiatal hernia    Hypertension    Hyperthyroidism    Ischemic bowel disease (HCC)    Multiple myeloma not having achieved remission (HCC)    Prediabetes 08/26/2021   Snoring 03/21/2014   Vitamin D deficiency     SURGICAL HISTORY: Past Surgical History:  Procedure Laterality Date   ABDOMINAL HYSTERECTOMY     BREAST LUMPECTOMY     right   MASTECTOMY     double    SOCIAL HISTORY: Social History   Socioeconomic History   Marital status: Divorced    Spouse name: Not on file   Number of children: 1   Years of education: Not on file   Highest education level: Not on file  Occupational History    Employer: UNEMPLOYED  Tobacco Use   Smoking status: Never   Smokeless tobacco: Never  Vaping Use   Vaping status: Never Used  Substance and Sexual Activity   Alcohol use: No   Drug use: No   Sexual activity: Yes  Other Topics Concern   Not on  file  Social History Narrative   Caffeine occasional drinker.   Not working/disabled.  12th grader. Divorced, one kids.       Social history: She receives disability benefits for history of breast cancer 2007 and ankylosing spondylitis.  She resides with special needs son.  She is divorced.  Non-smoker.  No alcohol consumption.       Family history: Father with history of prostate cancer.  Patient's mother was diagnosed with breast cancer at age 87.  Maternal aunt with history of breast cancer.  3 brothers.   Right handed   Caffeine occas   One level stays on this level       Social Drivers of Health   Financial Resource Strain: Low Risk  (08/26/2021)   Overall Financial Resource Strain (CARDIA)    Difficulty of  Paying Living Expenses: Not hard at all  Food Insecurity: No Food Insecurity (05/25/2022)   Hunger Vital Sign    Worried About Running Out of Food in the Last Year: Never true    Ran Out of Food in the Last Year: Never true  Transportation Needs: No Transportation Needs (08/26/2021)   PRAPARE - Administrator, Civil Service (Medical): No    Lack of Transportation (Non-Medical): No  Physical Activity: Inactive (08/26/2021)   Exercise Vital Sign    Days of Exercise per Week: 0 days    Minutes of Exercise per Session: 0 min  Stress: Not on file  Social Connections: Not on file  Intimate Partner Violence: Not At Risk (04/21/2022)   Humiliation, Afraid, Rape, and Kick questionnaire    Fear of Current or Ex-Partner: No    Emotionally Abused: No    Physically Abused: No    Sexually Abused: No    FAMILY HISTORY: Family History  Problem Relation Age of Onset   Thyroid disease Mother    Diabetes Mother    Hypertension Mother    Hyperlipidemia Mother    High blood pressure Father    High Cholesterol Father    Colitis Brother    Goiter Maternal Grandmother    Breast cancer Maternal Aunt     ALLERGIES:  is allergic to shellfish allergy, blue dyes (parenteral), and hydrochlorothiazide.  MEDICATIONS:  Current Outpatient Medications  Medication Sig Dispense Refill   acetaminophen (TYLENOL) 500 MG tablet Take 500 mg by mouth every 6 (six) hours as needed for moderate pain.     acyclovir (ZOVIRAX) 400 MG tablet TAKE 1 TABLET BY MOUTH TWICE A DAY 180 tablet 1   ALPRAZolam (XANAX) 0.5 MG tablet Take 1 tablet (0.5 mg total) by mouth 3 (three) times daily as needed for anxiety. 60 tablet 2   amLODipine (NORVASC) 5 MG tablet TAKE 1 TABLET (5 MG TOTAL) BY MOUTH DAILY. 90 tablet 2   aspirin EC 81 MG tablet Take 81 mg by mouth daily. Swallow whole.     dicyclomine (BENTYL) 20 MG tablet One tab before meals and at bedtime as needed for diarrhea with irritable bowel syndrome 60 tablet 1    furosemide (LASIX) 20 MG tablet TAKE 1 TABLET BY MOUTH EVERY DAY AS NEEDED 90 tablet 0   hydrochlorothiazide (HYDRODIURIL) 25 MG tablet TAKE 1 TABLET (25 MG TOTAL) BY MOUTH DAILY. 90 tablet 3   HYDROcodone-acetaminophen (NORCO) 10-325 MG tablet One half to one tab by mouth with food every 8-12 hours for pain. 15 tablet 0   KLOR-CON M20 20 MEQ tablet TAKE 2 TABLETS BY MOUTH EVERY DAY 180 tablet 1  lenalidomide (REVLIMID) 10 MG capsule Take 1 capsule (10 mg total) by mouth daily. [employer] Auth # [id number]   Date Obtained 05/12/2023  Take 1 capsule daily for 21 days then none for 7 days 21 capsule 0   lidocaine (LIDODERM) 5 % Place 1 patch onto the skin daily. Remove & Discard patch within 12 hours or as directed by MD 30 patch 1   metoprolol tartrate (LOPRESSOR) 25 MG tablet ONE HALF TAB BY MOUTH UP TO TWICE DAILY IF NEEDED FOR PALPITATIONS 90 tablet 1   nystatin (MYCOSTATIN) 100000 UNIT/ML suspension Take 5 mLs (500,000 Units total) by mouth 4 (four) times daily. 60 mL 0   olopatadine (PATANOL) 0.1 % ophthalmic solution Place 1 drop into both eyes 2 (two) times daily as needed. 5 mL PRN   pantoprazole (PROTONIX) 40 MG tablet TAKE 1 TABLET BY MOUTH EVERY DAY 90 tablet 3   triamcinolone cream (KENALOG) 0.1 % Apply 1 Application topically 3 (three) times daily. (Patient taking differently: Apply 1 Application topically 3 (three) times daily. As needed) 30 g 1   No current facility-administered medications for this visit.    REVIEW OF SYSTEMS:   Constitutional: ( - ) fevers, ( - )  chills , ( - ) night sweats Eyes: ( - ) blurriness of vision, ( - ) double vision, ( - ) watery eyes Ears, nose, mouth, throat, and face: ( - ) mucositis, ( - ) sore throat Respiratory: ( - ) cough, ( - ) dyspnea, ( - ) wheezes Cardiovascular: ( - ) palpitation, ( - ) chest discomfort, ( + ) lower extremity swelling Gastrointestinal:  ( - ) nausea, ( - ) heartburn, ( - ) change in bowel habits Skin: ( - ) abnormal skin  rashes Lymphatics: ( - ) new lymphadenopathy, ( - ) easy bruising Neurological: ( - ) numbness, ( - ) tingling, ( - ) new weaknesses Behavioral/Psych: ( - ) mood change, ( - ) new changes  All other systems were reviewed with the patient and are negative.  PHYSICAL EXAMINATION: ECOG PERFORMANCE STATUS: 1 - Symptomatic but completely ambulatory  Vitals:   05/14/23 1445  BP: 138/77  Pulse: 60  Resp: 13  Temp: 97.9 F (36.6 C)  SpO2: 100%       Filed Weights   05/14/23 1445  Weight: 169 lb 12.8 oz (77 kg)     GENERAL: Well-appearing middle-aged African-American female, alert, no distress and comfortable SKIN: skin color, texture, turgor are normal, no rashes or significant lesions EYES: conjunctiva are pink and non-injected, sclera clear LUNGS: clear to auscultation and percussion with normal breathing effort HEART: regular rate & rhythm and no murmurs. Improving bilateral lower extremity edema. Musculoskeletal: no cyanosis of digits and no clubbing  PSYCH: alert & oriented x 3, fluent speech NEURO: no focal motor/sensory deficits  LABORATORY DATA:  I have reviewed the data as listed    Latest Ref Rng & Units 05/14/2023    2:18 PM 01/27/2023    3:03 PM 01/05/2023    2:38 PM  CBC  WBC 4.0 - 10.5 K/uL 4.0  5.7  4.6   Hemoglobin 12.0 - 15.0 g/dL 47.8  29.5  62.1   Hematocrit 36.0 - 46.0 % 35.7  36.1  35.5   Platelets 150 - 400 K/uL 192  213  224        Latest Ref Rng & Units 05/14/2023    2:18 PM 01/27/2023    3:03 PM 01/05/2023  2:38 PM  CMP  Glucose 70 - 99 mg/dL 914  87  82   BUN 6 - 20 mg/dL 9  11  9    Creatinine 0.44 - 1.00 mg/dL 7.82  9.56  2.13   Sodium 135 - 145 mmol/L 140  141  139   Potassium 3.5 - 5.1 mmol/L 3.7  3.7  3.5   Chloride 98 - 111 mmol/L 107  108  106   CO2 22 - 32 mmol/L 29  27  27    Calcium 8.9 - 10.3 mg/dL 9.0  9.7  9.8   Total Protein 6.5 - 8.1 g/dL 7.5  8.1  7.8   Total Bilirubin 0.0 - 1.2 mg/dL 0.4  0.4  0.5   Alkaline Phos 38 -  126 U/L 88  91  88   AST 15 - 41 U/L 11  14  12    ALT 0 - 44 U/L 14  13  10      Lab Results  Component Value Date   MPROTEIN Not Observed 01/27/2023   MPROTEIN Not Observed 01/05/2023   MPROTEIN Not Observed 11/30/2022   Lab Results  Component Value Date   KPAFRELGTCHN 25.0 (H) 01/27/2023   KPAFRELGTCHN 25.2 (H) 01/05/2023   KPAFRELGTCHN 23.0 (H) 11/30/2022   LAMBDASER 17.6 01/27/2023   LAMBDASER 17.3 01/05/2023   LAMBDASER 15.7 11/30/2022   KAPLAMBRATIO 1.42 01/27/2023   KAPLAMBRATIO 1.46 01/05/2023   KAPLAMBRATIO 1.46 11/30/2022     RADIOGRAPHIC STUDIES: No results found.  ASSESSMENT & PLAN Sonya Franklin is a 58 y.o. female with medical history significant for newly diagnosed multiple myeloma with plasmacytoma who presents for a follow up visit.  # Multiple Myeloma with Plasmactyoma --Confirmed with L5 bone lesion biopsy and bone marrow biopsy. --Started VRD therapy on 05/05/2022.  --Patient completed her consultation with [facility] on 08/13/2022.  She notes that she would prefer to pursue maintenance therapy over bone marrow transplant. --After 8 cycles, transitioned to maintenance Revlimid 10 mg PO daily 21 days on, 7 days off on 10/23/2022.  PLAN: --Currently on maintenance therapy with Revlimid 10 mg p.o. daily 21 days on and 7 days off. --Labs today showed white blood cell 4.0, hemoglobin 11.6, MCV 90.4, platelets 192. Creatinine and calcium levels are in range --Last myeloma labs from 01/05/2023 showed M protein was undetectablle. Kappa light chain increased to 25.2.  --Continue with Revlimid therapy without any dose modifications.  --RTC in 4 weeks for labs and 12 weeks for clinic visit   #B/L lower extremity edema: --Doppler US from 11/03/2022 ruled out DVT --Edema has improved after undergoing RAI I-131 therapy for thyroid disease on 12/28/2022.  --Continue on lasix therapy managed by PCP  #Normocytic anemia--improved: --Etiologies include  Revlimid therapy or iron deficiency  --received 1 dose of Feraheme on 11/24/2022.   # Right lower extremity neuropathy: --Greatly improved --Monitor for now.   # Cancer related pain-improved --Secondary to focal tumor replacement of the right-side of the L5 vertebral body extending into the right L5 pedicle and posterior elements with a small extraosseous component extending into the right L5 neural foramen with moderate-severe stenosi -- Unable to tolerate opoid medications -- Under the care of pallative care. Medication includes lidocaine pain patch and tylenol --Patient is not interested in palliative radiation at this time.   #Supportive Care -- chemotherapy education completed -- port placement not required.  -- Pain medication as noted above  No orders of the defined types were placed in this encounter.  All questions were answered. The patient knows to call the clinic with any problems, questions or concerns.  I have spent a total of 30 minutes minutes of face-to-face and non-face-to-face time, preparing to see the patient, performing a medically appropriate examination, counseling and educating the patient, documenting clinical information in the electronic health record, independently interpreting results and communicating results to the patient, and care coordination.   Ulysees Barns, MD Department of Hematology/Oncology [facility] Phone: [phone] Pager: [phone] Email: [email or web address]    05/14/2023 4:24 PM

## 2023-05-17 ENCOUNTER — Telehealth: Payer: Self-pay | Admitting: Internal Medicine

## 2023-05-17 LAB — KAPPA/LAMBDA LIGHT CHAINS
Kappa free light chain: 25.7 mg/L — ABNORMAL HIGH (ref 3.3–19.4)
Kappa, lambda light chain ratio: 1.65 (ref 0.26–1.65)
Lambda free light chains: 15.6 mg/L (ref 5.7–26.3)

## 2023-05-17 NOTE — Telephone Encounter (Signed)
 LVM to CB to reschedule CPE to Wednesday before or another day. Dr Lenord Fellers is going to be in a meeting on 06/03/2023

## 2023-05-18 LAB — MULTIPLE MYELOMA PANEL, SERUM
Albumin SerPl Elph-Mcnc: 3.8 g/dL (ref 2.9–4.4)
Albumin/Glob SerPl: 1.3 (ref 0.7–1.7)
Alpha 1: 0.2 g/dL (ref 0.0–0.4)
Alpha2 Glob SerPl Elph-Mcnc: 0.7 g/dL (ref 0.4–1.0)
B-Globulin SerPl Elph-Mcnc: 1 g/dL (ref 0.7–1.3)
Gamma Glob SerPl Elph-Mcnc: 1.2 g/dL (ref 0.4–1.8)
Globulin, Total: 3.1 g/dL (ref 2.2–3.9)
IgA: 207 mg/dL (ref 87–352)
IgG (Immunoglobin G), Serum: 1406 mg/dL (ref 586–1602)
IgM (Immunoglobulin M), Srm: 33 mg/dL (ref 26–217)
Total Protein ELP: 6.9 g/dL (ref 6.0–8.5)

## 2023-05-18 NOTE — Addendum Note (Signed)
 Encounter addended by: Lorna Few, MD on: 05/18/2023 4:26 PM  Actions taken: Clinical Note Signed, Delete clinical note

## 2023-05-21 ENCOUNTER — Other Ambulatory Visit: Payer: Self-pay | Admitting: Internal Medicine

## 2023-05-21 ENCOUNTER — Other Ambulatory Visit: Payer: Medicare Other

## 2023-05-21 DIAGNOSIS — I10 Essential (primary) hypertension: Secondary | ICD-10-CM

## 2023-05-21 DIAGNOSIS — R7989 Other specified abnormal findings of blood chemistry: Secondary | ICD-10-CM

## 2023-05-21 DIAGNOSIS — R7302 Impaired glucose tolerance (oral): Secondary | ICD-10-CM

## 2023-05-21 DIAGNOSIS — G629 Polyneuropathy, unspecified: Secondary | ICD-10-CM

## 2023-05-21 DIAGNOSIS — Z0000 Encounter for general adult medical examination without abnormal findings: Secondary | ICD-10-CM

## 2023-05-21 DIAGNOSIS — E7800 Pure hypercholesterolemia, unspecified: Secondary | ICD-10-CM

## 2023-05-31 DIAGNOSIS — H6123 Impacted cerumen, bilateral: Secondary | ICD-10-CM | POA: Diagnosis not present

## 2023-05-31 DIAGNOSIS — H900 Conductive hearing loss, bilateral: Secondary | ICD-10-CM | POA: Diagnosis not present

## 2023-06-02 ENCOUNTER — Other Ambulatory Visit: Payer: Self-pay | Admitting: Internal Medicine

## 2023-06-02 NOTE — Telephone Encounter (Signed)
 Copied from CRM [id number]. Topic: Clinical - Medication Refill >> Jun 02, 2023 11:27 AM Fuller Mandril wrote: Most Recent Primary Care Visit:  Provider: Margaree Mackintosh  Department: [facility]  Visit Type: OFFICE VISIT  Date: 05/03/2023  Medication: metoprolol tartrate (LOPRESSOR) 25 MG tablet - Takes a whole one   Has the patient contacted their pharmacy? Yes (Agent: If no, request that the patient contact the pharmacy for the refill. If patient does not wish to contact the pharmacy document the reason why and proceed with request.) (Agent: If yes, when and what did the pharmacy advise?) written incorrectly need to contact   Is this the correct pharmacy for this prescription? Yes If no, delete pharmacy and type the correct one.  This is the patient's preferred pharmacy:  [facility] [phone] [place], [place] - [address] [place] [address] Phone: [phone] Fax: [phone]   Has the prescription been filled recently? Yes  Is the patient out of the medication? No - almost  Has the patient been seen for an appointment in the last year OR does the patient have an upcoming appointment? Yes  Can we respond through MyChart? Yes  Agent: Please be advised that Rx refills may take up to 3 business days. We ask that you follow-up with your pharmacy.

## 2023-06-02 NOTE — Telephone Encounter (Signed)
 Metoprolol refilled 05/21/23. Refills available at pharmacy.

## 2023-06-03 ENCOUNTER — Ambulatory Visit: Payer: Medicare Other | Admitting: Internal Medicine

## 2023-06-06 ENCOUNTER — Other Ambulatory Visit: Payer: Self-pay | Admitting: Internal Medicine

## 2023-06-08 ENCOUNTER — Other Ambulatory Visit: Payer: Self-pay | Admitting: Internal Medicine

## 2023-06-08 MED ORDER — METOPROLOL TARTRATE 25 MG PO TABS: ORAL_TABLET | ORAL | 1 refills | Status: DC

## 2023-06-08 NOTE — Telephone Encounter (Signed)
 Copied from CRM [id number]. Topic: Clinical - Medication Refill >> Jun 08, 2023 10:22 AM Nyra Capes wrote: Patient called in requesting medication refill  Most Recent Primary Care Visit:  Provider: Margaree Mackintosh  Department: [facility]  Visit Type: OFFICE VISIT  Date: 05/03/2023  Medication: metoprolol tartrate (LOPRESSOR) 25 MG tablet  Has the patient contacted their pharmacy? Yes on Friday prescription was for 1/2 pill patient is now taking 1 pill   Is this the correct pharmacy for this prescription? Yes If no, delete pharmacy and type the correct one.  This is the patient's preferred pharmacy:   [phone] [place], [place] - [address] [place] [address] Phone: [phone] Fax: [phone]   Has the prescription been filled recently? Yes in 03/21/23 90 day supply   Is the patient out of the medication? No patient has 4 days left  Has the patient been seen for an appointment in the last year OR does the patient have an upcoming appointment? Yes  Can we respond through MyChart? No  Agent: Please be advised that Rx refills may take up to 3 business days. We ask that you follow-up with your pharmacy.   Patient phone # [phone] ok to leave a detailed message

## 2023-06-11 ENCOUNTER — Inpatient Hospital Stay: Payer: Medicare Other | Attending: Physician Assistant

## 2023-06-11 ENCOUNTER — Other Ambulatory Visit: Payer: Self-pay | Admitting: *Deleted

## 2023-06-11 DIAGNOSIS — C9000 Multiple myeloma not having achieved remission: Secondary | ICD-10-CM | POA: Insufficient documentation

## 2023-06-11 LAB — CBC WITH DIFFERENTIAL (CANCER CENTER ONLY)
Abs Immature Granulocytes: 0.02 10*3/uL (ref 0.00–0.07)
Basophils Absolute: 0.1 10*3/uL (ref 0.0–0.1)
Basophils Relative: 2 %
Eosinophils Absolute: 0.3 10*3/uL (ref 0.0–0.5)
Eosinophils Relative: 7 %
HCT: 37.1 % (ref 36.0–46.0)
Hemoglobin: 12.1 g/dL (ref 12.0–15.0)
Immature Granulocytes: 0 %
Lymphocytes Relative: 25 %
Lymphs Abs: 1.2 10*3/uL (ref 0.7–4.0)
MCH: 28.8 pg (ref 26.0–34.0)
MCHC: 32.6 g/dL (ref 30.0–36.0)
MCV: 88.3 fL (ref 80.0–100.0)
Monocytes Absolute: 0.7 10*3/uL (ref 0.1–1.0)
Monocytes Relative: 15 %
Neutro Abs: 2.5 10*3/uL (ref 1.7–7.7)
Neutrophils Relative %: 51 %
Platelet Count: 184 10*3/uL (ref 150–400)
RBC: 4.2 MIL/uL (ref 3.87–5.11)
RDW: 14.3 % (ref 11.5–15.5)
WBC Count: 4.8 10*3/uL (ref 4.0–10.5)
nRBC: 0 % (ref 0.0–0.2)

## 2023-06-11 LAB — CMP (CANCER CENTER ONLY)
ALT: 15 U/L (ref 0–44)
AST: 12 U/L — ABNORMAL LOW (ref 15–41)
Albumin: 4.4 g/dL (ref 3.5–5.0)
Alkaline Phosphatase: 92 U/L (ref 38–126)
Anion gap: 5 (ref 5–15)
BUN: 8 mg/dL (ref 6–20)
CO2: 29 mmol/L (ref 22–32)
Calcium: 9.2 mg/dL (ref 8.9–10.3)
Chloride: 105 mmol/L (ref 98–111)
Creatinine: 0.66 mg/dL (ref 0.44–1.00)
GFR, Estimated: >60 mL/min (ref >60)
Glucose, Bld: 96 mg/dL (ref 70–99)
Potassium: 3.7 mmol/L (ref 3.5–5.1)
Sodium: 139 mmol/L (ref 135–145)
Total Bilirubin: 0.4 mg/dL (ref 0.0–1.2)
Total Protein: 7.9 g/dL (ref 6.5–8.1)

## 2023-06-11 LAB — LACTATE DEHYDROGENASE: LDH: 162 U/L (ref 98–192)

## 2023-06-11 MED ORDER — LENALIDOMIDE 10 MG PO CAPS: 10.0000 mg | ORAL_CAPSULE | Freq: Every day | ORAL | 0 refills | Status: DC

## 2023-06-14 LAB — KAPPA/LAMBDA LIGHT CHAINS
Kappa free light chain: 24.4 mg/L — ABNORMAL HIGH (ref 3.3–19.4)
Kappa, lambda light chain ratio: 1.42 (ref 0.26–1.65)
Lambda free light chains: 17.2 mg/L (ref 5.7–26.3)

## 2023-06-15 LAB — MULTIPLE MYELOMA PANEL, SERUM
Albumin SerPl Elph-Mcnc: 3.9 g/dL (ref 2.9–4.4)
Albumin/Glob SerPl: 1.1 (ref 0.7–1.7)
Alpha 1: 0.3 g/dL (ref 0.0–0.4)
Alpha2 Glob SerPl Elph-Mcnc: 0.8 g/dL (ref 0.4–1.0)
B-Globulin SerPl Elph-Mcnc: 1.2 g/dL (ref 0.7–1.3)
Gamma Glob SerPl Elph-Mcnc: 1.5 g/dL (ref 0.4–1.8)
Globulin, Total: 3.7 g/dL (ref 2.2–3.9)
IgA: 221 mg/dL (ref 87–352)
IgG (Immunoglobin G), Serum: 1428 mg/dL (ref 586–1602)
IgM (Immunoglobulin M), Srm: 33 mg/dL (ref 26–217)
Total Protein ELP: 7.6 g/dL (ref 6.0–8.5)

## 2023-06-22 ENCOUNTER — Other Ambulatory Visit: Payer: Self-pay

## 2023-06-22 DIAGNOSIS — C50911 Malignant neoplasm of unspecified site of right female breast: Secondary | ICD-10-CM | POA: Diagnosis not present

## 2023-06-22 DIAGNOSIS — Z9012 Acquired absence of left breast and nipple: Secondary | ICD-10-CM | POA: Diagnosis not present

## 2023-06-28 ENCOUNTER — Encounter: Payer: Self-pay | Admitting: Internal Medicine

## 2023-06-28 ENCOUNTER — Ambulatory Visit: Admitting: Internal Medicine

## 2023-06-28 VITALS — BP 130/76 | HR 52 | Temp 97.9°F | Ht 61.0 in | Wt 142.2 lb

## 2023-06-28 DIAGNOSIS — Z0000 Encounter for general adult medical examination without abnormal findings: Secondary | ICD-10-CM | POA: Diagnosis not present

## 2023-06-28 DIAGNOSIS — E0520 Thyrotoxicosis with toxic multinodular goiter without thyrotoxic crisis or storm: Secondary | ICD-10-CM | POA: Diagnosis not present

## 2023-06-28 DIAGNOSIS — Z8639 Personal history of other endocrine, nutritional and metabolic disease: Secondary | ICD-10-CM

## 2023-06-28 DIAGNOSIS — K219 Gastro-esophageal reflux disease without esophagitis: Secondary | ICD-10-CM | POA: Diagnosis not present

## 2023-06-28 DIAGNOSIS — R609 Edema, unspecified: Secondary | ICD-10-CM | POA: Diagnosis not present

## 2023-06-28 DIAGNOSIS — E876 Hypokalemia: Secondary | ICD-10-CM | POA: Diagnosis not present

## 2023-06-28 DIAGNOSIS — Z853 Personal history of malignant neoplasm of breast: Secondary | ICD-10-CM

## 2023-06-28 DIAGNOSIS — M7918 Myalgia, other site: Secondary | ICD-10-CM

## 2023-06-28 DIAGNOSIS — M459 Ankylosing spondylitis of unspecified sites in spine: Secondary | ICD-10-CM

## 2023-06-28 DIAGNOSIS — Z8739 Personal history of other diseases of the musculoskeletal system and connective tissue: Secondary | ICD-10-CM

## 2023-06-28 DIAGNOSIS — K580 Irritable bowel syndrome with diarrhea: Secondary | ICD-10-CM

## 2023-06-28 DIAGNOSIS — F419 Anxiety disorder, unspecified: Secondary | ICD-10-CM

## 2023-06-28 DIAGNOSIS — R002 Palpitations: Secondary | ICD-10-CM

## 2023-06-28 DIAGNOSIS — I10 Essential (primary) hypertension: Secondary | ICD-10-CM

## 2023-06-28 DIAGNOSIS — E7800 Pure hypercholesterolemia, unspecified: Secondary | ICD-10-CM

## 2023-06-28 DIAGNOSIS — R7302 Impaired glucose tolerance (oral): Secondary | ICD-10-CM | POA: Diagnosis not present

## 2023-06-28 DIAGNOSIS — T8549XA Other mechanical complication of breast prosthesis and implant, initial encounter: Secondary | ICD-10-CM

## 2023-06-28 DIAGNOSIS — C9000 Multiple myeloma not having achieved remission: Secondary | ICD-10-CM | POA: Diagnosis not present

## 2023-06-28 DIAGNOSIS — G629 Polyneuropathy, unspecified: Secondary | ICD-10-CM

## 2023-06-28 LAB — POCT URINALYSIS DIP (CLINITEK)
Bilirubin, UA: NEGATIVE
Blood, UA: NEGATIVE
Glucose, UA: NEGATIVE mg/dL
Ketones, POC UA: NEGATIVE mg/dL
Leukocytes, UA: NEGATIVE
Nitrite, UA: NEGATIVE
POC PROTEIN,UA: 30 — AB
Spec Grav, UA: 1.02 (ref 1.010–1.025)
Urobilinogen, UA: 0.2 U/dL
pH, UA: 6 (ref 5.0–8.0)

## 2023-06-28 MED ORDER — METHYLPREDNISOLONE ACETATE 80 MG/ML IJ SUSP
80.0000 mg | Freq: Once | INTRAMUSCULAR | Status: AC
Start: 2023-06-28 — End: 2023-06-28
  Administered 2023-06-28: 80 mg via INTRAMUSCULAR

## 2023-06-28 MED ORDER — TRIAMCINOLONE ACETONIDE 0.1 % EX CREA: 1.0000 | TOPICAL_CREAM | Freq: Two times a day (BID) | CUTANEOUS | 0 refills | Status: AC

## 2023-06-28 NOTE — Progress Notes (Signed)
 Annual Wellness Visit   Patient Care Team: Sylvan Evener, MD as PCP - General (Internal Medicine)  Visit Date: 06/28/23   Chief Complaint  Patient presents with   Annual Exam   Subjective:  Patient: Sonya Franklin, Female DOB: 12/25/1965, 58 y.o. MRN: [id number] Vitals:   06/28/23 1450  BP: 130/76   Sonya Franklin is a 57 y.o. Female who presents today for her Annual Wellness Visit. 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  Says she has some bites, likely from an insect, that are itching.   History of Hypertension; Palpitations treated with Amlodipine  5 mg daily, Metoprolol  tartrate 25 up to twice daily. She says that her heart rate tends to be tachycardic if she doesn't take her Metoprolol . Blood Pressure: normotensive today at 130/76. Dependent Edema treated with Lasix  20 mg or HCTZ 25 mg daily. Hypokalemia treated with Klor-Con  40 meq daily.   History of Impaired Glucose Tolerance with 10/19/2022 HgbA1c 5.8.   History of Musculoskeletal Pain; Ankylosing Spondylitis treated with Tylenol  and occasionally Depo-Medrol  80 mg IM in this office. She requests this today.   History of Multiple Myeloma treated with Revlimid  10 mg daily x 21 days, none for 7 days. Followed by Dr. Amparo Balk, Oncologist, who she last saw on 05/14/2023. 06/11/2023 Multiple Myeloma Panel showed M protein was undetectable. Kappa light chain decreased to 24.4 from 25.2 in February.   History of GE Reflux treated with Protonix  40 mg daily  History of Anxiety treated with Xanax  0.5 mg as needed up to three times daily.    History of Multinodular Goiter noted on a Thyroid  Scan in 2010. Biopsy in 2006 showed complex thyroid  nodule of right inferior lobe, showing a benign colloid nodule. Initially followed by Dr. Hubert Madden for management but most recently this has been followed by Dr. [place] Thapa for Hyperthyroidism. She has a US  Thyroid  on 06/24/2022 with Borderline-enlarged, multinodular thyroid  consistent with a Goiter;  1.2 cm LEFT inferior TR-4 thyroid  nodule; and  Involution of now 2.0 cm RIGHT inferior thyroid  nodule. This was previously biopsied in 02/09/2005. On  10/27/2022 NM Thyroid  Mult Uptake w/ Imaging with findings suggestive of toxic multinodular goiter. On 12/28/2022 this was treated with Radioactive Iodine PO.   Labs  06/11/2023 CBC: WNL 06/11/2023 CMP, compared to 05/14/2023: AST 12, elevated from 11; otherwise WNL.   04/21/2023 TSH: 0.43 Lipid Panel needs to be collected HgbA1c needs to be collected uACR collected today.   S/p Abdominal Hysterectomy; PAP Smear 01/10/2014  normal.  History of Breast Cancer, Right in 2007. Underwent Right Lumpectomy & Sentinel Node Biopsy in May for T2N0 grade 2 invasive ductal carcinoma; tumor was strongly ER/PR positive, HER2/neu negative. She was treated with 4 cycles of Doxorubicin and Cyclophosphamide followed by weekly Taxol x4. From January 2008 - June 2011 she took Tamoxifen and in 2012 she took Femara briefly, but discontinued that.S/p Bilateral Mastectomy 01/2006 w/ Implant Reconstruction by Dr. Reginia Caprice w/o evidence of residual disease. Recently seen for deflation of the left implant for which she was referred to Dr. Gilles Lacks. Says that she tried to get this evaluated, as she was concerned about the leakage, but isn't otherwise concerned.  History of Abdominal Pain in 2013  with CT showing imflammatory process w/ negative stool studies for C. Difficile; has had recurrent episodes that responded to steroids. Colonoscopy 03/21/2012 noted Small Internal  Hemorrhoids with repeat recommendation of 2024, which has been postponed until 02/2024.   Bone Density 12/10/2010  T-score Lumbar Spine L1-4  -1.5, osteopenic.   Vaccine Counseling: Due for Covid-19, Shingles 1/2, and PNA; UTD on Flu and Tdap. Past Medical History:  Diagnosis Date   Ankylosing spondylitis (HCC)    Anxiety    Arthritis    Breast cancer (HCC)    right breast   Bursitis of hip, right    Cardiac arrhythmia    Carotid stenosis 08/26/2021   GE reflux    Hiatal hernia    Hypertension    Hyperthyroidism    Ischemic bowel disease (HCC)    Multiple myeloma not having achieved remission (HCC)    Prediabetes 08/26/2021   Snoring 03/21/2014   Vitamin D  deficiency    Medical/Surgical History Narrative:  Allergic/Intolerant to: HCTZ - itching; Blue Dyes; Food Allergy to: Shellfish - anaphylaxis  Other - Hx of: Vitamin-D Deficiency  Family History  Problem Relation Age of Onset   Thyroid  disease Mother    Diabetes Mother    Hypertension Mother    Hyperlipidemia Mother    High blood pressure Father    High Cholesterol Father    Colitis Brother    Goiter Maternal Grandmother    Breast cancer Maternal Aunt    Social History   Social History Narrative   Caffeine occasional drinker.   Not working/disabled.  12th grader. Divorced, one kids.    Receives disability benefits due to hx of breast cancer 2007 and ankylosing spondylitis. Divorced. 1 special needs son who she resides. Non-smoker and no alcohol consumption. Occasionally drinks caffeine.    Right handed   Caffeine occas   One level stays on this level      Fhx:   Maternal Grandmother w/ hx of Goiter   Mother w/ hx of Breast Cancer, diagnosed at 67, Diabetes, Hyperlipidemia, Hypertension, and Thyroid  Disease.    Father w/ hx of Prostate Cancer, High Cholesterol, and High Blood Pressure     Maternal Aunt w/ hx of Breast Cancer.    3 Brothers - 1 w/ hx of Colitis.   Review of Systems  Constitutional:  Negative  for chills, fever, malaise/fatigue and weight loss.  HENT:  Negative for hearing loss, sinus pain and sore throat.   Respiratory:  Negative for cough, hemoptysis and shortness of breath.   Cardiovascular:  Negative for chest pain, palpitations, leg swelling and PND.  Gastrointestinal:  Negative for abdominal pain, constipation, diarrhea, heartburn, nausea and vomiting.  Genitourinary:  Negative for dysuria, frequency and urgency.  Musculoskeletal:  Negative for back pain, myalgias and neck pain.  Skin:  Positive for itching (attributed to insect bites). Negative for rash.  Neurological:  Negative for dizziness, tingling, seizures and headaches.  Endo/Heme/Allergies:  Negative for polydipsia.  Psychiatric/Behavioral:  Negative for depression. The patient is not nervous/anxious.     Objective:  Vitals: BP 130/76   Pulse (!) 52   Temp 97.9 F (36.6 C) (Temporal)   Ht 5\' 1"  (1.549 m)   Wt 142 lb 2.4 oz (64.5 kg)   SpO2 94%   BMI 26.86 kg/m  Physical Exam Vitals and nursing note reviewed.  Constitutional:      General: She is not in acute distress.    Appearance: Normal appearance. She is not ill-appearing or toxic-appearing.  HENT:     Head: Normocephalic and atraumatic.     Right Ear: Hearing, tympanic membrane, ear canal and external ear normal.     Left Ear: Hearing, tympanic membrane, ear canal and external ear normal.     Mouth/Throat:     Pharynx: Oropharynx is clear.  Eyes:     Extraocular Movements: Extraocular movements intact.     Pupils: Pupils are equal, round, and reactive to light.  Neck:     Thyroid : No thyroid  mass, thyromegaly or thyroid  tenderness.     Vascular: No carotid bruit.  Cardiovascular:     Rate and Rhythm: Normal rate and regular rhythm. No extrasystoles are present.    Pulses:          Dorsalis pedis pulses are 2+ on the right side and 2+ on the left side.     Heart sounds: Normal heart sounds. No murmur heard.    No friction rub. No gallop.   Pulmonary:     Effort: Pulmonary effort is normal.     Breath sounds: Normal breath sounds. No decreased breath sounds, wheezing, rhonchi or rales.  Chest:     Chest wall: No mass.  Breasts:    Right: Absent.     Left: Absent.     Comments: Bilateral Breast Implants Left implant deflated Abdominal:     Palpations: Abdomen is soft. There is no hepatomegaly, splenomegaly or mass.     Tenderness: There is no abdominal tenderness.     Hernia: No hernia is present.  Musculoskeletal:     Cervical back: Normal range of motion.     Right lower leg: No edema.     Left lower leg: No edema.     Right foot: No deformity.     Left foot: No deformity.  Feet:     Right foot:     Skin integrity: Skin integrity normal. No ulcer, blister, skin breakdown, erythema, warmth, callus, dry skin or fissure.     Left foot:     Skin integrity: Skin integrity normal. No ulcer, blister, skin breakdown, erythema, warmth, callus, dry skin or fissure.  Lymphadenopathy:     Cervical: No cervical adenopathy.     Upper Body:     Right upper body: No supraclavicular adenopathy.     Left upper body: No supraclavicular adenopathy.  Skin:    General: Skin is warm and dry.  Neurological:     General: No focal deficit present.     Mental Status: She is alert and oriented to person, place, and time. Mental status is at baseline.     Sensory: Sensation is intact.     Motor: Motor function is intact. No weakness.     Deep Tendon Reflexes: Reflexes are normal and symmetric.  Psychiatric:        Attention and Perception: Attention normal.        Mood and Affect: Mood normal.        Speech: Speech normal.        Behavior: Behavior normal.        Thought Content: Thought content normal.        Cognition and Memory: Cognition normal.        Judgment: Judgment normal.   Most Recent Functional Status Assessment:    06/28/2023    2:41 PM  In your present state of health, do you have any difficulty performing the  following activities:  Hearing? 0  Vision? 0  Difficulty concentrating or making decisions? 1  Walking or climbing stairs? 1  Dressing or bathing? 0  Doing errands, shopping? 0  Preparing Food and eating ? N  Using the Toilet? N  In the past six months, have you accidently leaked urine? N  Do you have problems with loss of bowel control? N  Managing your Medications? N  Managing your Finances? Y  Housekeeping or managing your Housekeeping? N   Most Recent Fall Risk Assessment:    06/28/2023    2:47 PM  Fall Risk   Falls in the past year? 1  Number falls in past yr: 0  Injury with Fall? 0  Risk for fall due to : No Fall Risks  Follow up Falls evaluation completed   Most Recent Depression Screenings:    06/29/2022    3:24 PM 05/25/2022   10:40 AM  PHQ 2/9 Scores  PHQ - 2 Score 0 0   Most Recent Cognitive Screening:    06/28/2023    2:48 PM  6CIT Screen  What Year? 4 points  What month? 3 points  What time? 3 points  Count back from 20 4 points  Months in reverse 4 points  Repeat phrase 10 points  Total Score 28 points   Results:  Studies Obtained And Personally Reviewed By Me:  Diabetic Foot Exam - Simple   Simple Foot Form Diabetic Foot exam was performed with the following findings: Yes 06/28/2023  3:25 PM  Visual Inspection No deformities, no ulcerations, no other skin breakdown bilaterally: Yes Sensation Testing Intact to touch and monofilament testing bilaterally: Yes Pulse Check Posterior Tibialis and Dorsalis pulse intact bilaterally: Yes Comments   PAP Smear 01/10/2014  normal.  Colonoscopy 03/21/2012 noted Small Internal Hemorrhoids.   Bone Density 12/10/2010  T-score Lumbar Spine L1-4  -1.5, osteopenic.  Labs:     Component Value Date/Time   NA 139 06/11/2023 1430   K 3.7 06/11/2023 1430   CL 105 06/11/2023 1430   CO2 29 06/11/2023 1430   GLUCOSE 96 06/11/2023 1430   BUN 8 06/11/2023 1430   CREATININE 0.66 06/11/2023 1430   CREATININE 0.75  10/30/2022 1200   CALCIUM  9.2 06/11/2023 1430   PROT 7.9 06/11/2023 1430   ALBUMIN 4.4 06/11/2023 1430   AST 12 (L) 06/11/2023 1430   ALT 15 06/11/2023 1430   ALKPHOS 92 06/11/2023 1430   BILITOT 0.4 06/11/2023 1430   GFRNONAA >60 06/11/2023 1430   GFRNONAA 108 04/05/2020 1128   GFRAA 126 04/05/2020 1128    Lab Results  Component Value Date   WBC 4.8 06/11/2023   HGB 12.1 06/11/2023   HCT 37.1 06/11/2023   MCV 88.3 06/11/2023   PLT 184 06/11/2023   Lab Results  Component Value Date   CHOL 181 07/17/2021   HDL 64 07/17/2021   LDLCALC 100 (H) 07/17/2021   TRIG 76 07/17/2021   CHOLHDL 2.8 07/17/2021   Lab Results  Component Value Date   HGBA1C 5.8 (H) 10/19/2022    Lab Results  Component Value Date   TSH 0.43 04/21/2023    Assessment & Plan:   Orders Placed This Encounter  Procedures   POCT URINALYSIS DIP (CLINITEK)   Meds ordered this encounter  Medications   triamcinolone  cream (KENALOG ) 0.1 %    Sig: Apply 1 Application topically 2 (two) times daily.    Dispense:  30 g    Refill:  0  Other Labs Reviewed today: 06/11/2023 CBC: WNL 06/11/2023 CMP, compared to 05/14/2023: AST 12, elevated from 11; otherwise  WNL.   04/21/2023 TSH: 0.43  Lipid Panel needs to be collected HgbA1c needs to be collected uACR needs to be collected  These are scheduled to be collected on 4/27.   Insect Bites itching. Sending in Triamcinolone  cream 0.1% to apply topically twice daily.  Hypertension; Palpitations treated with Amlodipine  5 mg daily, Metoprolol  tartrate 25 up to twice daily. She says that her heart rate tends to be tachycardic if she doesn't take her Metoprolol . Blood Pressure: normotensive today at 130/76.   Dependent Edema treated with Lasix  20 mg or HCTZ 25 mg daily.   Hypokalemia treated with Klor-Con  40 meq daily.   Impaired Glucose Tolerance with 10/19/2022 HgbA1c 5.8%.   Musculoskeletal Pain; Ankylosing Spondylitis treated with Tylenol  and occasionally  Depo-Medrol  80 mg IM in this office -- Depo-Medrol  80 mg given IM today per request.  Multiple Myeloma treated with Revlimid  10 mg daily x 21 days, none for 7 days. Followed by Dr. Amparo Balk, Oncologist, who she last saw on 05/14/2023. 06/11/2023 Multiple Myeloma Panel showed M protein was undetectable. Kappa light chain decreased to 24.4 from 25.2 in February.   GE Reflux treated with Protonix  40 mg daily  Anxiety treated with Xanax  0.5 mg as needed up to three times daily.   Multinodular Goiter noted on a Thyroid  Scan in 2010. Biopsy in 2006 showed complex thyroid  nodule of right inferior lobe, showing a benign colloid nodule. Initially followed by Dr. Hubert Madden for management but most recently this has been followed by Dr. [place] Thapa for Hyperthyroidism. She has a US  Thyroid  on 06/24/2022 with Borderline-enlarged, multinodular thyroid  consistent with a Goiter;  1.2 cm LEFT inferior TR-4 thyroid  nodule; and  Involution of now 2.0 cm RIGHT inferior thyroid  nodule. This was previously biopsied in 02/09/2005. On  10/27/2022 NM Thyroid  Mult Uptake w/ Imaging with findings suggestive of toxic multinodular goiter. On 12/28/2022 this was treated with Radioactive Iodine PO.   S/p Abdominal Hysterectomy; PAP Smear 01/10/2014  normal.  History of Breast Cancer, Right in 2007. Underwent Right Lumpectomy & Sentinel Node Biopsy in May for T2N0 grade 2 invasive ductal carcinoma; tumor was strongly ER/PR positive, HER2/neu negative. She was treated with 4 cycles of Doxorubicin and Cyclophosphamide followed by weekly Taxol x4. From January 2008 - June 2011 she took Tamoxifen and in 2012 she took Femara briefly, but discontinued that.   S/p Bilateral Mastectomy 01/2006 w/ Implant Reconstruction by Dr. Reginia Caprice w/o evidence of residual disease. Recently seen for deflation of the left implant for which she was referred to Dr. Gilles Lacks. Says that she tried to get this evaluated, as she was concerned about the  leakage, but isn't otherwise concerned.  History of Abdominal Pain in 2013 with CT showing imflammatory process w/ negative stool studies for C. Difficile; has had recurrent episodes that responded to steroids.   Colonoscopy 03/21/2012 noted Small Internal Hemorrhoids with repeat recommendation of 2024, which has been postponed until 02/2024.   Bone Density 12/10/2010  T-score Lumbar Spine L1-4  -1.5, osteopenic.   Vaccine Counseling: Due for Covid-19, Shingles 1/2, and PNA; UTD on Flu and Tdap.   Annual wellness visit done today including the all of the following: Reviewed patient's Family Medical History Reviewed and updated list of patient's medical providers Assessment of cognitive impairment was done Assessed patient's functional ability Established a written schedule for health screening services Health Risk Assessent Completed and Reviewed  Discussed health benefits of physical activity, and encouraged her to engage in regular exercise appropriate for her  age and condition.    I,Emily Lagle,acting as a Neurosurgeon for Sylvan Evener, MD.,have documented all relevant documentation on the behalf of Sylvan Evener, MD,as directed by  Sylvan Evener, MD while in the presence of Sylvan Evener, MD.   I, Sylvan Evener, MD, have reviewed all documentation for this visit. The documentation on 07/09/23 for the exam, diagnosis, procedures, and orders are all accurate and complete.

## 2023-07-02 DIAGNOSIS — C50911 Malignant neoplasm of unspecified site of right female breast: Secondary | ICD-10-CM | POA: Diagnosis not present

## 2023-07-08 ENCOUNTER — Other Ambulatory Visit: Payer: Self-pay | Admitting: *Deleted

## 2023-07-08 DIAGNOSIS — C9000 Multiple myeloma not having achieved remission: Secondary | ICD-10-CM

## 2023-07-08 MED ORDER — LENALIDOMIDE 10 MG PO CAPS: 10.0000 mg | ORAL_CAPSULE | Freq: Every day | ORAL | 0 refills | Status: DC

## 2023-07-09 ENCOUNTER — Other Ambulatory Visit

## 2023-07-09 ENCOUNTER — Inpatient Hospital Stay: Payer: Medicare Other | Attending: Physician Assistant

## 2023-07-09 DIAGNOSIS — I10 Essential (primary) hypertension: Secondary | ICD-10-CM | POA: Diagnosis not present

## 2023-07-09 DIAGNOSIS — R7302 Impaired glucose tolerance (oral): Secondary | ICD-10-CM

## 2023-07-09 DIAGNOSIS — C9000 Multiple myeloma not having achieved remission: Secondary | ICD-10-CM | POA: Diagnosis not present

## 2023-07-09 LAB — CMP (CANCER CENTER ONLY)
ALT: 14 U/L (ref 0–44)
AST: 12 U/L — ABNORMAL LOW (ref 15–41)
Albumin: 4.3 g/dL (ref 3.5–5.0)
Alkaline Phosphatase: 83 U/L (ref 38–126)
Anion gap: 5 (ref 5–15)
BUN: 12 mg/dL (ref 6–20)
CO2: 30 mmol/L (ref 22–32)
Calcium: 9.2 mg/dL (ref 8.9–10.3)
Chloride: 107 mmol/L (ref 98–111)
Creatinine: 0.64 mg/dL (ref 0.44–1.00)
GFR, Estimated: >60 mL/min (ref >60)
Glucose, Bld: 98 mg/dL (ref 70–99)
Potassium: 3.6 mmol/L (ref 3.5–5.1)
Sodium: 142 mmol/L (ref 135–145)
Total Bilirubin: 0.3 mg/dL (ref 0.0–1.2)
Total Protein: 7.6 g/dL (ref 6.5–8.1)

## 2023-07-09 LAB — CBC WITH DIFFERENTIAL (CANCER CENTER ONLY)
Abs Immature Granulocytes: 0.03 10*3/uL (ref 0.00–0.07)
Basophils Absolute: 0.1 10*3/uL (ref 0.0–0.1)
Basophils Relative: 2 %
Eosinophils Absolute: 0.3 10*3/uL (ref 0.0–0.5)
Eosinophils Relative: 5 %
HCT: 35.1 % — ABNORMAL LOW (ref 36.0–46.0)
Hemoglobin: 11.7 g/dL — ABNORMAL LOW (ref 12.0–15.0)
Immature Granulocytes: 1 %
Lymphocytes Relative: 21 %
Lymphs Abs: 1.2 10*3/uL (ref 0.7–4.0)
MCH: 29 pg (ref 26.0–34.0)
MCHC: 33.3 g/dL (ref 30.0–36.0)
MCV: 87.1 fL (ref 80.0–100.0)
Monocytes Absolute: 1 10*3/uL (ref 0.1–1.0)
Monocytes Relative: 17 %
Neutro Abs: 3.2 10*3/uL (ref 1.7–7.7)
Neutrophils Relative %: 54 %
Platelet Count: 183 10*3/uL (ref 150–400)
RBC: 4.03 MIL/uL (ref 3.87–5.11)
RDW: 14 % (ref 11.5–15.5)
WBC Count: 5.7 10*3/uL (ref 4.0–10.5)
nRBC: 0 % (ref 0.0–0.2)

## 2023-07-09 LAB — LACTATE DEHYDROGENASE: LDH: 161 U/L (ref 98–192)

## 2023-07-09 NOTE — Patient Instructions (Addendum)
 It was a pleasure to see you today. Continue current meds and continue to see Dr. Aretha Kubas, Endocrinologist as well as Dr. Rosaline Coma, Oncologist. Medicare wellness exam due in one year. Labs to be done here soon.

## 2023-07-10 LAB — LIPID PANEL
Cholesterol: 196 mg/dL (ref <200)
HDL: 96 mg/dL (ref >=50)
LDL Cholesterol (Calc): 86 mg/dL
Non-HDL Cholesterol (Calc): 100 mg/dL (ref <130)
Total CHOL/HDL Ratio: 2 (calc) (ref <5.0)
Triglycerides: 58 mg/dL (ref <150)

## 2023-07-10 LAB — MICROALBUMIN / CREATININE URINE RATIO
Creatinine, Urine: 135 mg/dL (ref 20–275)
Microalb Creat Ratio: 42 mg/g{creat} — ABNORMAL HIGH (ref <30)
Microalb, Ur: 5.7 mg/dL

## 2023-07-10 LAB — HEMOGLOBIN A1C
Hgb A1c MFr Bld: 5.7 % — ABNORMAL HIGH (ref <5.7)
Mean Plasma Glucose: 117 mg/dL
eAG (mmol/L): 6.5 mmol/L

## 2023-07-12 ENCOUNTER — Other Ambulatory Visit

## 2023-07-12 DIAGNOSIS — R7302 Impaired glucose tolerance (oral): Secondary | ICD-10-CM

## 2023-07-12 DIAGNOSIS — R7989 Other specified abnormal findings of blood chemistry: Secondary | ICD-10-CM

## 2023-07-12 DIAGNOSIS — E7800 Pure hypercholesterolemia, unspecified: Secondary | ICD-10-CM

## 2023-07-12 LAB — MULTIPLE MYELOMA PANEL, SERUM
Albumin SerPl Elph-Mcnc: 3.7 g/dL (ref 2.9–4.4)
Albumin/Glob SerPl: 1.1 (ref 0.7–1.7)
Alpha 1: 0.2 g/dL (ref 0.0–0.4)
Alpha2 Glob SerPl Elph-Mcnc: 0.7 g/dL (ref 0.4–1.0)
B-Globulin SerPl Elph-Mcnc: 1.1 g/dL (ref 0.7–1.3)
Gamma Glob SerPl Elph-Mcnc: 1.3 g/dL (ref 0.4–1.8)
Globulin, Total: 3.4 g/dL (ref 2.2–3.9)
IgA: 211 mg/dL (ref 87–352)
IgG (Immunoglobin G), Serum: 1329 mg/dL (ref 586–1602)
IgM (Immunoglobulin M), Srm: 34 mg/dL (ref 26–217)
Total Protein ELP: 7.1 g/dL (ref 6.0–8.5)

## 2023-07-12 LAB — KAPPA/LAMBDA LIGHT CHAINS
Kappa free light chain: 23.1 mg/L — ABNORMAL HIGH (ref 3.3–19.4)
Kappa, lambda light chain ratio: 1.38 (ref 0.26–1.65)
Lambda free light chains: 16.7 mg/L (ref 5.7–26.3)

## 2023-07-14 ENCOUNTER — Other Ambulatory Visit: Payer: Medicare Other

## 2023-07-19 ENCOUNTER — Encounter: Payer: Self-pay | Admitting: Endocrinology

## 2023-07-19 ENCOUNTER — Ambulatory Visit: Payer: Medicare Other | Admitting: Endocrinology

## 2023-07-19 VITALS — BP 122/70 | HR 54 | Ht 61.0 in | Wt 167.6 lb

## 2023-07-19 DIAGNOSIS — E0590 Thyrotoxicosis, unspecified without thyrotoxic crisis or storm: Secondary | ICD-10-CM

## 2023-07-19 DIAGNOSIS — E0520 Thyrotoxicosis with toxic multinodular goiter without thyrotoxic crisis or storm: Secondary | ICD-10-CM

## 2023-07-19 NOTE — Progress Notes (Signed)
 Outpatient Endocrinology Note [name], MD  07/19/23  Patient's Name: Sonya Franklin    DOB: 05-10-1965    MRN: [id number]  REASON OF VISIT: Follow-up for thyroid  nodules / subclinical hyperthyroidism  PCP: Sylvan Evener, MD  HISTORY OF PRESENT ILLNESS:   Sonya Franklin is a 58 y.o. old female with past medical history as listed below is presented for a follow up of toxic thyroid  nodules /subclinical hypothyroidism.  Pertinent Thyroid  History:  Patient is known to have multiple bilateral thyroid  nodules from 2006.  Patient has intermittently mildly low TSH with normal free T4 and free T3 consistent with subclinical hyperthyroidism at least from 2010. At that time she had symptoms of shakiness and palpitations.  She had intermittently low TSH over the years.  Patient was seen in endocrinology clinic in 2020 and May 2024 by Dr. Hubert Madden.  She had thyroid  scan in December 2010 which showed thyroid  enlargement especially on the right side and heterogenous uptake and scattered area of increased and decreased uptake along with 24-hour uptake of 34.7%.   Ultrasound thyroid  in April 2024 : Borderline enlarged multinodular thyroid  consistent with goiter.  Bilateral thyroid  nodules left inferior thyroid  nodule solid, hypoechoic measuring 1.2 cm and right inferior thyroid  nodule solid, hypoechoic, with echogenic foci measuring 2.0 cm, it was previously measured 2.7 cm in 2006.  Patient had ultrasound thyroid  in November 2006 with fine-needle aspiration in June 2007 of right thyroid  nodule with benign cytology / most consistent with a benign colloid nodule.  Family history of goiter and autoimmune thyroid  disease. She had negative thyrotropin receptor antibody.  # She had radioactive iodine uptake and scan in August 2024 with 4-hour uptake of 15% and 24-hour uptake of 37% consistent with multinodular goiter. Patient had RAI I-131 therapy with 27.5 mCi on December 28, 2022 for subclinical  hyperthyroidism.  - Patient has remained biochemically euthyroid after radioiodine therapy at multiple occasions, without thyroid  medications.  # Patient was diagnosed with multiple myeloma and currently on chemotherapy following with oncology.  Interval history Patient complains of occasional palpitation otherwise no complaints today.  She has been getting chemotherapy for multiple myeloma, reports also getting steroid at times.  She denies any neck discomfort or neck compressive symptoms.  No increased sweating or heat intolerance.  No cold intolerance.  She is having less pedal edema and also lost weight.  She had normal thyroid  function test at multiple occasions after radioactive iodine therapy for hyperthyroidism.  No other complaints today.    REVIEW OF SYSTEMS:  As per history of present illness.   PAST MEDICAL HISTORY: Past Medical History:  Diagnosis Date   Ankylosing spondylitis (HCC)    Anxiety    Arthritis    Breast cancer (HCC)    right breast   Bursitis of hip, right    Cardiac arrhythmia    Carotid stenosis 08/26/2021   GE reflux    Hiatal hernia    Hypertension    Hyperthyroidism    Ischemic bowel disease (HCC)    Multiple myeloma not having achieved remission (HCC)    Prediabetes 08/26/2021   Snoring 03/21/2014   Vitamin D  deficiency     PAST SURGICAL HISTORY: Past Surgical History:  Procedure Laterality Date   ABDOMINAL HYSTERECTOMY     BREAST LUMPECTOMY     right   MASTECTOMY     double    ALLERGIES: Allergies  Allergen Reactions   Shellfish Allergy Anaphylaxis   Blue Dyes (Parenteral)  Hydrochlorothiazide  Itching    FAMILY HISTORY:  Family History  Problem Relation Age of Onset   Thyroid  disease Mother    Diabetes Mother    Hypertension Mother    Hyperlipidemia Mother    High blood pressure Father    High Cholesterol Father    Colitis Brother    Goiter Maternal Grandmother    Breast cancer Maternal Aunt     SOCIAL  HISTORY: Social History   Socioeconomic History   Marital status: Divorced    Spouse name: Not on file   Number of children: 1   Years of education: Not on file   Highest education level: Not on file  Occupational History    Employer: UNEMPLOYED  Tobacco Use   Smoking status: Never   Smokeless tobacco: Never  Vaping Use   Vaping status: Never Used  Substance and Sexual Activity   Alcohol use: No   Drug use: No   Sexual activity: Yes  Other Topics Concern   Not on file  Social History Narrative   Caffeine occasional drinker.   Not working/disabled.  12th grader. Divorced, one kids.    Receives disability benefits due to hx of breast cancer 2007 and ankylosing spondylitis. Divorced. 1 special needs son who she resides. Non-smoker and no alcohol consumption. Occasionally drinks caffeine.    Right handed   Caffeine occas   One level stays on this level      Fhx:   Maternal Grandmother w/ hx of Goiter   Mother w/ hx of Breast Cancer, diagnosed at 48, Diabetes, Hyperlipidemia, Hypertension, and Thyroid  Disease.    Father w/ hx of Prostate Cancer, High Cholesterol, and High Blood Pressure     Maternal Aunt w/ hx of Breast Cancer.    3 Brothers - 1 w/ hx of Colitis.   Social Drivers of Corporate investment banker Strain: Low Risk  (08/26/2021)   Overall Financial Resource Strain (CARDIA)    Difficulty of Paying Living Expenses: Not hard at all  Food Insecurity: No Food Insecurity (05/25/2022)   Hunger Vital Sign    Worried About Running Out of Food in the Last Year: Never true    Ran Out of Food in the Last Year: Never true  Transportation Needs: No Transportation Needs (08/26/2021)   PRAPARE - Administrator, Civil Service (Medical): No    Lack of Transportation (Non-Medical): No  Physical Activity: Inactive (08/26/2021)   Exercise Vital Sign    Days of Exercise per Week: 0 days    Minutes of Exercise per Session: 0 min  Stress: Not on file  Social Connections:  Not on file    MEDICATIONS:  Current Outpatient Medications  Medication Sig Dispense Refill   acetaminophen  (TYLENOL ) 500 MG tablet Take 500 mg by mouth every 6 (six) hours as needed for moderate pain.     ALPRAZolam  (XANAX ) 0.5 MG tablet Take 1 tablet (0.5 mg total) by mouth 3 (three) times daily as needed for anxiety. 60 tablet 2   amLODipine  (NORVASC ) 5 MG tablet TAKE 1 TABLET (5 MG TOTAL) BY MOUTH DAILY. 90 tablet 2   aspirin EC 81 MG tablet Take 81 mg by mouth daily. Swallow whole.     dicyclomine  (BENTYL ) 20 MG tablet One tab before meals and at bedtime as needed for diarrhea with irritable bowel syndrome 60 tablet 1   furosemide  (LASIX ) 20 MG tablet TAKE 1 TABLET BY MOUTH EVERY DAY AS NEEDED 90 tablet 0   HYDROcodone -acetaminophen  (NORCO)  10-325 MG tablet One half to one tab by mouth with food every 8-12 hours for pain. 15 tablet 0   KLOR-CON  M20 20 MEQ tablet TAKE 2 TABLETS BY MOUTH EVERY DAY 180 tablet 1   lenalidomide  (REVLIMID ) 10 MG capsule Take 1 capsule (10 mg total) by mouth daily. Auth #  [id number] Date Obtained 07/08/2023  Take 1 capsule daily for 21 days then none for 7 days 21 capsule 0   lidocaine  (LIDODERM ) 5 % Place 1 patch onto the skin daily. Remove & Discard patch within 12 hours or as directed by MD 30 patch 1   metoprolol  tartrate (LOPRESSOR ) 25 MG tablet TAKE 1 TAB BY MOUTH UP TO TWICE DAILY IF NEEDED FOR PALPITATIONS 90 tablet 1   nystatin  (MYCOSTATIN ) 100000 UNIT/ML suspension Take 5 mLs (500,000 Units total) by mouth 4 (four) times daily. 60 mL 0   olopatadine  (PATANOL) 0.1 % ophthalmic solution Place 1 drop into both eyes 2 (two) times daily as needed. 5 mL PRN   pantoprazole  (PROTONIX ) 40 MG tablet TAKE 1 TABLET BY MOUTH EVERY DAY 90 tablet 3   triamcinolone  cream (KENALOG ) 0.1 % Apply 1 Application topically 2 (two) times daily. 30 g 0   acyclovir  (ZOVIRAX ) 400 MG tablet TAKE 1 TABLET BY MOUTH TWICE A DAY (Patient not taking: Reported on 07/19/2023) 180 tablet 1    hydrochlorothiazide  (HYDRODIURIL ) 25 MG tablet TAKE 1 TABLET (25 MG TOTAL) BY MOUTH DAILY. (Patient not taking: Reported on 07/19/2023) 90 tablet 3   No current facility-administered medications for this visit.    PHYSICAL EXAM: Vitals:   07/19/23 1410  BP: 122/70  Pulse: (!) 54  SpO2: 99%  Weight: 167 lb 9.6 oz (76 kg)  Height: 5\' 1"  (1.549 m)    Body mass index is 31.67 kg/m.    General: Well developed, well nourished female in no apparent distress.  HEENT: AT/[place], no external lesions. Hearing intact to the spoken word Eyes: EOMI. No stare, proptosis. Conjunctiva clear and no icterus. Neck: Trachea midline, neck supple without appreciable thyromegaly or lymphadenopathy and no palpable thyroid  nodule Lungs: Clear to auscultation, no wheeze. Respirations not labored Heart: S1S2, Regular in rate and rhythm. Abdomen: Soft, non tender Neurologic: Alert, oriented, normal speech, deep tendon biceps reflexes normal,  no gross focal neurological deficit Extremities: No pedal pitting edema, no tremors of outstretched hands Skin: Warm, color good.  Psychiatric: Does not appear depressed or anxious  PERTINENT HISTORIC LABORATORY AND IMAGING STUDIES:  All pertinent laboratory results were reviewed. Please see HPI also for further details.   TSH  Date Value Ref Range Status  04/21/2023 0.43 0.40 - 4.50 mIU/L Final  02/18/2023 1.00 0.40 - 4.50 mIU/L Final  02/01/2023 0.81 0.35 - 5.50 uIU/mL Final    Latest Reference Range & Units 08/13/22 09:28  Thyrotropin Receptor Ab 0.00 - 1.75 IU/L <1.10    CLINICAL DATA:  Hypothyroid.  hypothyroidism; history of goiter   EXAM: THYROID  ULTRASOUND in 06/24/2022   TECHNIQUE: Ultrasound examination of the thyroid  gland and adjacent soft tissues was performed.   COMPARISON:  US  Thyroid , 01/20/2005. US  Thyroid  biopsy, 02/09/2005 - RIGHT inferior nodule. CT chest, 01/12/2022.   FINDINGS: Parenchymal Echotexture: Mildly heterogenous   Isthmus:  0.7 cm   Right lobe: 5.8 x 2.1 x 2.1 cm   Left lobe: 4.9 x 1.8 x 1.3 cm   _________________________________________________________   Estimated total number of nodules >/= 1 cm: 3   Number of spongiform nodules >/=  2 cm  not described below (TR1): 0   Number of mixed cystic and solid nodules >/= 1.5 cm not described below (TR2): 0   _________________________________________________________   Nodule # 2:   Location: RIGHT; Inferior   Maximum size: 2.0 cm; Other 2 dimensions: 1.7 x 1.4 cm, previously 2.7 x 2.5 x 1.8 cm   Composition: solid/almost completely solid (2)   Echogenicity: hypoechoic (2)   Shape: not taller-than-wide (0)   Margins: ill-defined (0)   Echogenic foci: macrocalcifications (1)   ACR TI-RADS total points: 5.   ACR TI-RADS risk category: TR4 (4-6 points).   This nodule has demonstrated interval involution. It was previously biopsied in 02/09/2005.   Assuming a benign pathologic diagnosis, repeat sampling and/or dedicated follow-up is not recommended.   _________________________________________________________   Nodule # 3:   Location: LEFT; Inferior   Maximum size: 1.2 cm; Other 2 dimensions: 0.9 x 0.9 cm   Composition: solid/almost completely solid (2)   Echogenicity: hypoechoic (2)   Shape: not taller-than-wide (0)   Margins: ill-defined (0)   Echogenic foci: none (0)   ACR TI-RADS total points: 4.   ACR TI-RADS risk category: TR4 (4-6 points).   ACR TI-RADS recommendations:   *Given size (>/= 1 - 1.4 cm) and appearance, a follow-up ultrasound in 1 year should be considered based on TI-RADS criteria.   _________________________________________________________   Additional nodules, such as labeled # 1 (0.8 cm RIGHT inferior mixed cystic and solid, TR-2) and # 4 (1.0 cm RIGHT inferior cystic TR-1) do not meet threshold for follow-up nor biopsy per current criteria.   No cervical adenopathy or abnormal fluid collection  within the imaged neck.   IMPRESSION: 1. Borderline-enlarged, multinodular thyroid  consistent with a goiter. 2. 1.2 cm LEFT inferior TR-4 thyroid  nodule. A follow-up ultrasound in 1 year should be considered based on TI-RADS criteria. 3. Involution of now 2.0 cm RIGHT inferior thyroid  nodule. This was previously biopsied in 02/09/2005. Assuming a benign pathologic diagnosis, repeat sampling and/or dedicated follow-up is not recommended.   The above is in keeping with the ACR TI-RADS recommendations - J Am Coll Radiol 2017;14:587-595.   EXAM: THYROID  SCAN AND UPTAKE - 4 AND 24 HOURS on 10/27/2022   TECHNIQUE: Following oral administration of I-123 capsule, anterior planar imaging was acquired at 24 hours. Thyroid  uptake was calculated with a thyroid  probe at 4-6 hours and 24 hours.   RADIOPHARMACEUTICALS:  Four hundred one uCi I-123 sodium iodide p.o.   COMPARISON:  Thyroid  ultrasound 06/24/2022   FINDINGS: Mild nodularity of uptake within the thyroid  gland. No cold nodules. Pyramidal lobe is evident.   4 hour I-123 uptake = 18.1% (normal 5-20%)   24 hour I-123 uptake = 36.7% (normal 10-30%)   IMPRESSION: Imaging findings and iodine uptake are suggestive of toxic multinodular goiter.       ASSESSMENT / PLAN  1. Subclinical hyperthyroidism   2. Toxic multinodular goiter    -Patient has toxic multinodular goiter.  She had radioactive iodine uptake and scan in August with 4-hour uptake of 15% and 24-hour uptake of 37% consistent with multinodular goiter.  She had negative thyrotropin receptor antibody, negative for Graves' disease. -She underwent RAI therapy with I-131 27.5 mCi on December 28, 2022.  Patient has normalized and her thyroid  function test with normal TSH of 0.81 in November and December 2024.  She has not been on thyroid  medication.  She has remained euthyroid biochemically at multiple occasions.  -Patient is known to have bilateral multiple thyroid  nodules,  last ultrasound was in April 2024, right solid hypoechoic thyroid  nodule measuring 2.0 cm was slightly smaller than that was measured in ultrasound in 2006, status post FNA of this nodule in 2006 with benign cytology.  She has left thyroid  nodule solid hypoechoic measuring 1.2 cm as well.  No cold nodule noted on nuclear scan.  Plan: -Patient is overall euthyroid clinically.  She reports occasional palpitation. -She has not been on thyroid  medication. She needs to close monitoring of thyroid  function test.  Discussed that after radioactive iodine treatment can usually progress to hypothyroid, in minimal case euthyroid or hyperthyroid.  -Thyroid  function test today. - Will increase the interval of endocrine visits, will follow-up in 5 months   Diagnoses and all orders for this visit:  Subclinical hyperthyroidism -     T4, free -     T3, free -     TSH  Toxic multinodular goiter    DISPOSITION Follow up in clinic in 5 months suggested.  All questions answered and patient verbalized understanding of the plan.  [place] Lety Cullens, MD [facility] [address] [place], [address] [place], [place] [address] Phone # [phone]  At least part of this note was generated using voice recognition software. Inadvertent word errors may have occurred, which were not recognized during the proofreading process.

## 2023-07-20 ENCOUNTER — Encounter: Payer: Self-pay | Admitting: Endocrinology

## 2023-07-20 LAB — T4, FREE: Free T4: 1.3 ng/dL (ref 0.8–1.8)

## 2023-07-20 LAB — TSH: TSH: 0.93 m[IU]/L (ref 0.40–4.50)

## 2023-07-20 LAB — T3, FREE: T3, Free: 3.3 pg/mL (ref 2.3–4.2)

## 2023-08-04 ENCOUNTER — Other Ambulatory Visit: Payer: Self-pay | Admitting: *Deleted

## 2023-08-04 DIAGNOSIS — C9000 Multiple myeloma not having achieved remission: Secondary | ICD-10-CM

## 2023-08-04 MED ORDER — LENALIDOMIDE 10 MG PO CAPS: 10.0000 mg | ORAL_CAPSULE | Freq: Every day | ORAL | 0 refills | Status: DC

## 2023-08-06 ENCOUNTER — Inpatient Hospital Stay: Payer: Medicare Other | Attending: Physician Assistant

## 2023-08-06 ENCOUNTER — Inpatient Hospital Stay (HOSPITAL_BASED_OUTPATIENT_CLINIC_OR_DEPARTMENT_OTHER): Payer: Medicare Other | Admitting: Hematology and Oncology

## 2023-08-06 VITALS — BP 125/73 | HR 56 | Temp 98.0°F | Resp 13 | Wt 168.1 lb

## 2023-08-06 DIAGNOSIS — R600 Localized edema: Secondary | ICD-10-CM | POA: Diagnosis not present

## 2023-08-06 DIAGNOSIS — D649 Anemia, unspecified: Secondary | ICD-10-CM | POA: Insufficient documentation

## 2023-08-06 DIAGNOSIS — C9000 Multiple myeloma not having achieved remission: Secondary | ICD-10-CM

## 2023-08-06 DIAGNOSIS — Z803 Family history of malignant neoplasm of breast: Secondary | ICD-10-CM | POA: Insufficient documentation

## 2023-08-06 DIAGNOSIS — Z7961 Long term (current) use of immunomodulator: Secondary | ICD-10-CM | POA: Diagnosis not present

## 2023-08-06 DIAGNOSIS — G893 Neoplasm related pain (acute) (chronic): Secondary | ICD-10-CM | POA: Insufficient documentation

## 2023-08-06 LAB — CBC WITH DIFFERENTIAL (CANCER CENTER ONLY)
Abs Immature Granulocytes: 0.02 10*3/uL (ref 0.00–0.07)
Basophils Absolute: 0.1 10*3/uL (ref 0.0–0.1)
Basophils Relative: 2 %
Eosinophils Absolute: 0.3 10*3/uL (ref 0.0–0.5)
Eosinophils Relative: 7 %
HCT: 35.7 % — ABNORMAL LOW (ref 36.0–46.0)
Hemoglobin: 11.9 g/dL — ABNORMAL LOW (ref 12.0–15.0)
Immature Granulocytes: 0 %
Lymphocytes Relative: 20 %
Lymphs Abs: 1 10*3/uL (ref 0.7–4.0)
MCH: 28.9 pg (ref 26.0–34.0)
MCHC: 33.3 g/dL (ref 30.0–36.0)
MCV: 86.7 fL (ref 80.0–100.0)
Monocytes Absolute: 0.8 10*3/uL (ref 0.1–1.0)
Monocytes Relative: 16 %
Neutro Abs: 2.8 10*3/uL (ref 1.7–7.7)
Neutrophils Relative %: 55 %
Platelet Count: 196 10*3/uL (ref 150–400)
RBC: 4.12 MIL/uL (ref 3.87–5.11)
RDW: 14.4 % (ref 11.5–15.5)
WBC Count: 5.1 10*3/uL (ref 4.0–10.5)
nRBC: 0 % (ref 0.0–0.2)

## 2023-08-06 LAB — CMP (CANCER CENTER ONLY)
ALT: 13 U/L (ref 0–44)
AST: 11 U/L — ABNORMAL LOW (ref 15–41)
Albumin: 4.3 g/dL (ref 3.5–5.0)
Alkaline Phosphatase: 82 U/L (ref 38–126)
Anion gap: 6 (ref 5–15)
BUN: 8 mg/dL (ref 6–20)
CO2: 28 mmol/L (ref 22–32)
Calcium: 9.2 mg/dL (ref 8.9–10.3)
Chloride: 107 mmol/L (ref 98–111)
Creatinine: 0.68 mg/dL (ref 0.44–1.00)
GFR, Estimated: >60 mL/min (ref >60)
Glucose, Bld: 98 mg/dL (ref 70–99)
Potassium: 3.4 mmol/L — ABNORMAL LOW (ref 3.5–5.1)
Sodium: 141 mmol/L (ref 135–145)
Total Bilirubin: 0.6 mg/dL (ref 0.0–1.2)
Total Protein: 7.6 g/dL (ref 6.5–8.1)

## 2023-08-06 LAB — LACTATE DEHYDROGENASE: LDH: 162 U/L (ref 98–192)

## 2023-08-06 NOTE — Progress Notes (Signed)
 / [facility] [phone]   [phone]  PROGRESS NOTE  Patient Care Team: Sylvan Evener, MD as PCP - General (Internal Medicine)  Hematological/Oncological History # Multiple Myeloma with Plasmacytoma 03/03/2022: established with the [facility] in Oncology  04/15/2022: CT biopsy and bone marrow biopsy confirmed the presence of a plasmacytoma and bone marrow involvement of multiple myeloma.  05/05/2022: Cycle 1 Day 1 of VRD therapy 05/27/2022: Cycle 2 Day 1 of VRD therapy 06/17/2022: Cycle 3 Day 1 of VRD therapy 07/08/2022: Cycle 4 Day 1 of VRD therapy 07/29/2022: Cycle 5 Day 1 of VRD therapy 08/19/2022: Cycle 6 Day 1 of VRD therapy 09/09/2022: Cycle 7 Day 1 of VRD therapy 09/30/2022: Cycle 8 Day 1 of VRD therapy 10/23/2022: Started maintenance Revlimid  therapy  Interval History:  Sonya Franklin 58 y.o. female with medical history significant for recently diagnosed multiple myeloma with plasmacytoma who presents for a follow up visit. The patient's last visit was on 05/14/2023. She presents today for a follow up for maintenance Revlimid  therapy.   On exam today Sonya Franklin reports she feels well overall on exam today.  She reports that she tolerates the Revlimid  pills well but sometimes she feels her heart racing.  She notes that she does not think that her heart racing is related to the Revlimid  pills.  She has has had no recent infectious symptoms such as fevers, chills, sweats.  She is also taking a baby aspirin with a daily with no bleeding, bruising, or dark stools.  She continues to have good levels of energy and no other changes in her health.  She has had no hospitalizations, ER visits, or new medications.  Overall she feels well and has no questions concerns or complaints today.  Full 10 point ROS is otherwise negative.  MEDICAL HISTORY:  Past Medical History:  Diagnosis Date   Ankylosing spondylitis (HCC)    Anxiety    Arthritis    Breast cancer (HCC)     right breast   Bursitis of hip, right    Cardiac arrhythmia    Carotid stenosis 08/26/2021   GE reflux    Hiatal hernia    Hypertension    Hyperthyroidism    Ischemic bowel disease (HCC)    Multiple myeloma not having achieved remission (HCC)    Prediabetes 08/26/2021   Snoring 03/21/2014   Vitamin D  deficiency     SURGICAL HISTORY: Past Surgical History:  Procedure Laterality Date   ABDOMINAL HYSTERECTOMY     BREAST LUMPECTOMY     right   MASTECTOMY     double    SOCIAL HISTORY: Social History   Socioeconomic History   Marital status: Divorced    Spouse name: Not on file   Number of children: 1   Years of education: Not on file   Highest education level: Not on file  Occupational History    Employer: UNEMPLOYED  Tobacco Use   Smoking status: Never   Smokeless tobacco: Never  Vaping Use   Vaping status: Never Used  Substance and Sexual Activity   Alcohol use: No   Drug use: No   Sexual activity: Yes  Other Topics Concern   Not on file  Social History Narrative   Caffeine occasional drinker.   Not working/disabled.  12th grader. Divorced, one kids.    Receives disability benefits due to hx of breast cancer 2007 and ankylosing spondylitis. Divorced. 1 special needs son who she resides. Non-smoker and no alcohol consumption. Occasionally  drinks caffeine.    Right handed   Caffeine occas   One level stays on this level      Fhx:   Maternal Grandmother w/ hx of Goiter   Mother w/ hx of Breast Cancer, diagnosed at 32, Diabetes, Hyperlipidemia, Hypertension, and Thyroid  Disease.    Father w/ hx of Prostate Cancer, High Cholesterol, and High Blood Pressure     Maternal Aunt w/ hx of Breast Cancer.    3 Brothers - 1 w/ hx of Colitis.   Social Drivers of Corporate investment banker Strain: Low Risk  (08/26/2021)   Overall Financial Resource Strain (CARDIA)    Difficulty of Paying Living Expenses: Not hard at all  Food Insecurity: No Food Insecurity (05/25/2022)    Hunger Vital Sign    Worried About Running Out of Food in the Last Year: Never true    Ran Out of Food in the Last Year: Never true  Transportation Needs: No Transportation Needs (08/26/2021)   PRAPARE - Administrator, Civil Service (Medical): No    Lack of Transportation (Non-Medical): No  Physical Activity: Inactive (08/26/2021)   Exercise Vital Sign    Days of Exercise per Week: 0 days    Minutes of Exercise per Session: 0 min  Stress: Not on file  Social Connections: Not on file  Intimate Partner Violence: Not At Risk (04/21/2022)   Humiliation, Afraid, Rape, and Kick questionnaire    Fear of Current or Ex-Partner: No    Emotionally Abused: No    Physically Abused: No    Sexually Abused: No    FAMILY HISTORY: Family History  Problem Relation Age of Onset   Thyroid  disease Mother    Diabetes Mother    Hypertension Mother    Hyperlipidemia Mother    High blood pressure Father    High Cholesterol Father    Colitis Brother    Goiter Maternal Grandmother    Breast cancer Maternal Aunt     ALLERGIES:  is allergic to shellfish allergy, blue dyes (parenteral), and hydrochlorothiazide .  MEDICATIONS:  Current Outpatient Medications  Medication Sig Dispense Refill   acetaminophen  (TYLENOL ) 500 MG tablet Take 500 mg by mouth every 6 (six) hours as needed for moderate pain.     acyclovir  (ZOVIRAX ) 400 MG tablet TAKE 1 TABLET BY MOUTH TWICE A DAY (Patient not taking: Reported on 07/19/2023) 180 tablet 1   ALPRAZolam  (XANAX ) 0.5 MG tablet Take 1 tablet (0.5 mg total) by mouth 3 (three) times daily as needed for anxiety. 60 tablet 2   amLODipine  (NORVASC ) 5 MG tablet TAKE 1 TABLET (5 MG TOTAL) BY MOUTH DAILY. 90 tablet 2   aspirin EC 81 MG tablet Take 81 mg by mouth daily. Swallow whole.     dicyclomine  (BENTYL ) 20 MG tablet One tab before meals and at bedtime as needed for diarrhea with irritable bowel syndrome 60 tablet 1   furosemide  (LASIX ) 20 MG tablet TAKE 1 TABLET BY  MOUTH EVERY DAY AS NEEDED 90 tablet 0   hydrochlorothiazide  (HYDRODIURIL ) 25 MG tablet TAKE 1 TABLET (25 MG TOTAL) BY MOUTH DAILY. (Patient not taking: Reported on 07/19/2023) 90 tablet 3   HYDROcodone -acetaminophen  (NORCO) 10-325 MG tablet One half to one tab by mouth with food every 8-12 hours for pain. 15 tablet 0   KLOR-CON  M20 20 MEQ tablet TAKE 2 TABLETS BY MOUTH EVERY DAY 180 tablet 1   lenalidomide  (REVLIMID ) 10 MG capsule Take 1 capsule (10 mg total) by mouth  daily. Auth # [id number] Date Obtained 08/04/23  Take 1 capsule daily for 21 days then none for 7 days 21 capsule 0   lidocaine  (LIDODERM ) 5 % Place 1 patch onto the skin daily. Remove & Discard patch within 12 hours or as directed by MD 30 patch 1   metoprolol  tartrate (LOPRESSOR ) 25 MG tablet TAKE 1 TAB BY MOUTH UP TO TWICE DAILY IF NEEDED FOR PALPITATIONS 90 tablet 1   nystatin  (MYCOSTATIN ) 100000 UNIT/ML suspension Take 5 mLs (500,000 Units total) by mouth 4 (four) times daily. 60 mL 0   olopatadine  (PATANOL) 0.1 % ophthalmic solution Place 1 drop into both eyes 2 (two) times daily as needed. 5 mL PRN   pantoprazole  (PROTONIX ) 40 MG tablet TAKE 1 TABLET BY MOUTH EVERY DAY 90 tablet 3   triamcinolone  cream (KENALOG ) 0.1 % Apply 1 Application topically 2 (two) times daily. 30 g 0   No current facility-administered medications for this visit.    REVIEW OF SYSTEMS:   Constitutional: ( - ) fevers, ( - )  chills , ( - ) night sweats Eyes: ( - ) blurriness of vision, ( - ) double vision, ( - ) watery eyes Ears, nose, mouth, throat, and face: ( - ) mucositis, ( - ) sore throat Respiratory: ( - ) cough, ( - ) dyspnea, ( - ) wheezes Cardiovascular: ( - ) palpitation, ( - ) chest discomfort, ( + ) lower extremity swelling Gastrointestinal:  ( - ) nausea, ( - ) heartburn, ( - ) change in bowel habits Skin: ( - ) abnormal skin rashes Lymphatics: ( - ) new lymphadenopathy, ( - ) easy bruising Neurological: ( - ) numbness, ( - ) tingling, ( - )  new weaknesses Behavioral/Psych: ( - ) mood change, ( - ) new changes  All other systems were reviewed with the patient and are negative.  PHYSICAL EXAMINATION: ECOG PERFORMANCE STATUS: 1 - Symptomatic but completely ambulatory  Vitals:   08/06/23 1426  BP: 125/73  Pulse: (!) 56  Resp: 13  Temp: 98 F (36.7 C)  SpO2: 100%   Filed Weights   08/06/23 1426  Weight: 168 lb 1.6 oz (76.2 kg)    GENERAL: Well-appearing middle-aged African-American female, alert, no distress and comfortable SKIN: skin color, texture, turgor are normal, no rashes or significant lesions EYES: conjunctiva are pink and non-injected, sclera clear LUNGS: clear to auscultation and percussion with normal breathing effort HEART: regular rate & rhythm and no murmurs. Improving bilateral lower extremity edema. Musculoskeletal: no cyanosis of digits and no clubbing  PSYCH: alert & oriented x 3, fluent speech NEURO: no focal motor/sensory deficits  LABORATORY DATA:  I have reviewed the data as listed    Latest Ref Rng & Units 08/06/2023    1:51 PM 07/09/2023    2:51 PM 06/11/2023    2:30 PM  CBC  WBC 4.0 - 10.5 K/uL 5.1  5.7  4.8   Hemoglobin 12.0 - 15.0 g/dL 54.0  98.1  19.1   Hematocrit 36.0 - 46.0 % 35.7  35.1  37.1   Platelets 150 - 400 K/uL 196  183  184        Latest Ref Rng & Units 08/06/2023    1:51 PM 07/09/2023    2:51 PM 06/11/2023    2:30 PM  CMP  Glucose 70 - 99 mg/dL 98  98  96   BUN 6 - 20 mg/dL 8  12  8    Creatinine 0.44 - 1.00  mg/dL 8.11  9.14  7.82   Sodium 135 - 145 mmol/L 141  142  139   Potassium 3.5 - 5.1 mmol/L 3.4  3.6  3.7   Chloride 98 - 111 mmol/L 107  107  105   CO2 22 - 32 mmol/L 28  30  29    Calcium  8.9 - 10.3 mg/dL 9.2  9.2  9.2   Total Protein 6.5 - 8.1 g/dL 7.6  7.6  7.9   Total Bilirubin 0.0 - 1.2 mg/dL 0.6  0.3  0.4   Alkaline Phos 38 - 126 U/L 82  83  92   AST 15 - 41 U/L 11  12  12    ALT 0 - 44 U/L 13  14  15      Lab Results  Component Value Date   MPROTEIN  Not Observed 07/09/2023   MPROTEIN Not Observed 06/11/2023   MPROTEIN Not Observed 05/14/2023   Lab Results  Component Value Date   KPAFRELGTCHN 23.1 (H) 07/09/2023   KPAFRELGTCHN 24.4 (H) 06/11/2023   KPAFRELGTCHN 25.7 (H) 05/14/2023   LAMBDASER 16.7 07/09/2023   LAMBDASER 17.2 06/11/2023   LAMBDASER 15.6 05/14/2023   KAPLAMBRATIO 1.38 07/09/2023   KAPLAMBRATIO 1.42 06/11/2023   KAPLAMBRATIO 1.65 05/14/2023     RADIOGRAPHIC STUDIES: No results found.  ASSESSMENT & PLAN Sonya Franklin is a 58 y.o. female with medical history significant for newly diagnosed multiple myeloma with plasmacytoma who presents for a follow up visit.  # Multiple Myeloma with Plasmactyoma --Confirmed with L5 bone lesion biopsy and bone marrow biopsy. --Started VRD therapy on 05/05/2022.  --Patient completed her consultation with [facility] on 08/13/2022.  She notes that she would prefer to pursue maintenance therapy over bone marrow transplant. --After 8 cycles, transitioned to maintenance Revlimid  10 mg PO daily 21 days on, 7 days off on 10/23/2022.  PLAN: --Currently on maintenance therapy with Revlimid  10 mg p.o. daily 21 days on and 7 days off. --Labs today showed white blood cell 5.1, Hgb 11.9, MCV 86.7, Plt 196.  Creatinine and calcium  levels are in range --Last myeloma labs from 07/09/2023 showed M protein was undetectablle. Kappa light chain stable at 23.1.  --Continue with Revlimid  therapy without any dose modifications.  --RTC in 4 weeks for labs and 12 weeks for clinic visit   #B/L lower extremity edema: --Doppler US  from 11/03/2022 ruled out DVT --Edema has improved after undergoing RAI I-131 therapy for thyroid  disease on 12/28/2022.  --Continue on lasix  therapy managed by PCP  #Normocytic anemia--improved: --Etiologies include Revlimid  therapy or iron deficiency  --received 1 dose of Feraheme on 11/24/2022.   # Right lower extremity neuropathy: --Greatly improved --Monitor  for now.   # Cancer related pain-improved --Secondary to focal tumor replacement of the right-side of the L5 vertebral body extending into the right L5 pedicle and posterior elements with a small extraosseous component extending into the right L5 neural foramen with moderate-severe stenosi -- Unable to tolerate opoid medications -- Under the care of pallative care. Medication includes lidocaine  pain patch and tylenol  --Patient is not interested in palliative radiation at this time.   #Supportive Care -- chemotherapy education completed -- port placement not required.  -- Pain medication as noted above  No orders of the defined types were placed in this encounter.  All questions were answered. The patient knows to call the clinic with any problems, questions or concerns.  I have spent a total of 30 minutes minutes of face-to-face and non-face-to-face  time, preparing to see the patient, performing a medically appropriate examination, counseling and educating the patient, documenting clinical information in the electronic health record, independently interpreting results and communicating results to the patient, and care coordination.   Rogerio Clay, MD Department of Hematology/Oncology [facility] at [facility] Phone: [phone] Pager: [phone] Email: [email or web address] 1.  Hematologist 08/07/2023 4:57 PM

## 2023-08-07 ENCOUNTER — Encounter: Payer: Self-pay | Admitting: Physician Assistant

## 2023-08-07 ENCOUNTER — Encounter: Payer: Self-pay | Admitting: Hematology and Oncology

## 2023-08-10 LAB — KAPPA/LAMBDA LIGHT CHAINS
Kappa free light chain: 22 mg/L — ABNORMAL HIGH (ref 3.3–19.4)
Kappa, lambda light chain ratio: 1.44 (ref 0.26–1.65)
Lambda free light chains: 15.3 mg/L (ref 5.7–26.3)

## 2023-08-13 ENCOUNTER — Institutional Professional Consult (permissible substitution): Payer: Medicare Other | Admitting: Plastic Surgery

## 2023-08-17 LAB — MULTIPLE MYELOMA PANEL, SERUM
Albumin SerPl Elph-Mcnc: 3.9 g/dL (ref 2.9–4.4)
Albumin/Glob SerPl: 1.3 (ref 0.7–1.7)
Alpha 1: 0.2 g/dL (ref 0.0–0.4)
Alpha2 Glob SerPl Elph-Mcnc: 0.7 g/dL (ref 0.4–1.0)
B-Globulin SerPl Elph-Mcnc: 1 g/dL (ref 0.7–1.3)
Gamma Glob SerPl Elph-Mcnc: 1.2 g/dL (ref 0.4–1.8)
Globulin, Total: 3.2 g/dL (ref 2.2–3.9)
IgA: 216 mg/dL (ref 87–352)
IgG (Immunoglobin G), Serum: 1380 mg/dL (ref 586–1602)
IgM (Immunoglobulin M), Srm: 36 mg/dL (ref 26–217)
Total Protein ELP: 7.1 g/dL (ref 6.0–8.5)

## 2023-08-27 ENCOUNTER — Ambulatory Visit: Admitting: Internal Medicine

## 2023-08-27 ENCOUNTER — Ambulatory Visit: Payer: Self-pay | Admitting: *Deleted

## 2023-08-27 VITALS — BP 126/88 | HR 67 | Temp 98.6°F | Ht 61.0 in | Wt 164.8 lb

## 2023-08-27 DIAGNOSIS — S1096XA Insect bite of unspecified part of neck, initial encounter: Secondary | ICD-10-CM | POA: Diagnosis not present

## 2023-08-27 DIAGNOSIS — J029 Acute pharyngitis, unspecified: Secondary | ICD-10-CM

## 2023-08-27 DIAGNOSIS — W57XXXA Bitten or stung by nonvenomous insect and other nonvenomous arthropods, initial encounter: Secondary | ICD-10-CM

## 2023-08-27 DIAGNOSIS — C9000 Multiple myeloma not having achieved remission: Secondary | ICD-10-CM | POA: Diagnosis not present

## 2023-08-27 DIAGNOSIS — M7918 Myalgia, other site: Secondary | ICD-10-CM

## 2023-08-27 LAB — POCT RAPID STREP A (OFFICE): Rapid Strep A Screen: NEGATIVE

## 2023-08-27 MED ORDER — METHYLPREDNISOLONE ACETATE 80 MG/ML IJ SUSP: 80.0000 mg | Freq: Once | INTRAMUSCULAR | Status: AC

## 2023-08-27 MED ORDER — FLUCONAZOLE 150 MG PO TABS: 150.0000 mg | ORAL_TABLET | Freq: Once | ORAL | 0 refills | Status: AC

## 2023-08-27 MED ORDER — AZITHROMYCIN 250 MG PO TABS: ORAL_TABLET | ORAL | 0 refills | Status: AC

## 2023-08-27 NOTE — Telephone Encounter (Signed)
 Patient put on schedule for 08/27/2023 @ 4 P.M

## 2023-08-27 NOTE — Telephone Encounter (Signed)
 FYI Only or Action Required?: Action required by provider  Patient was last seen in primary care on 06/28/2023 by Baxley, Jaynie Meyers, MD. Called Nurse Triage reporting Sore Throat. Symptoms began yesterday. Interventions attempted: Nothing. Symptoms are: gradually worsening.  Triage Disposition: See Physician Within 24 Hours, Home Care  Patient/caregiver understands and will follow disposition?: Declines appointment- requesting Doxycycline  Reason for Disposition  Unknown type of tick bite with no complications  SEVERE (e.g., excruciating) throat pain  Answer Assessment - Initial Assessment Questions 1. ONSET: When did the throat start hurting? (Hours or days ago)      yesterday 2. SEVERITY: How bad is the sore throat? (Scale 1-10; mild, moderate or severe)   - MILD (1-3):  Doesn't interfere with eating or normal activities.   - MODERATE (4-7): Interferes with eating some solids and normal activities.   - SEVERE (8-10):  Excruciating pain, interferes with most normal activities.   - SEVERE WITH DYSPHAGIA (10): Can't swallow liquids, drooling.     10/10 3. STREP EXPOSURE: Has there been any exposure to strep within the past week? If Yes, ask: What type of contact occurred?      Yes- exposure last Thursday 4.  VIRAL SYMPTOMS: Are there any symptoms of a cold, such as a runny nose, cough, hoarse voice or red eyes?      no 5. FEVER: Do you have a fever? If Yes, ask: What is your temperature, how was it measured, and when did it start?     no 6. PUS ON THE TONSILS: Is there pus on the tonsils in the back of your throat?     Hurts to swallow  Answer Assessment - Initial Assessment Questions 1. ATTACHED:  Is the tick still on the skin?  (e.g., yes, no, unsure)     no 2. ONSET - TICK STILL ATTACHED:  How long do you think the tick has been on your skin? (e.g., hours, days, unsure)  Note:  Is there a recent activity (camping, hiking) where the caller may have been exposed?      Last week- removed 3. ONSET - TICK NOT STILL ATTACHED: If the tick has been removed, how long do you think the tick was attached before you removed it? (e.g., 5 hours, 2 days). When was this?     Less than 24 hours 4. LOCATION: Where is the tick bite located? (e.g., arm, leg)     neck 5. TYPE of TICK: Is it a wood tick or a deer tick? (e.g., deer tick, wood tick; unsure)     tiny 6. SIZE of TICK: How big is the tick? (e.g., size of poppy seed, apple seed, watermelon seed; unsure) Note: Deer ticks can be the size of a poppy seed (nymph) or an apple seed (adult).       tiny 7. ENGORGED: Did the tick look flat or engorged (full, swollen)? (e.g., flat, engorged; unsure)     no 8. OTHER SYMPTOMS: Do you have any other symptoms? (e.g., fever, rash, redness at bite area, red ring around bite)     Redness, small knot  Protocols used: Sore Throat-A-AH, Tick Bite-A-AH    Copied from CRM [id number]. Topic: Clinical - Red Word Triage >> Aug 27, 2023  8:56 AM Dorthula Gavel H wrote: Red Word that prompted transfer to Nurse Triage: Pt states she has a sore throat and a tick bite, called in wanting doxycycline  because her son had a sore throat and was prescribed it.

## 2023-08-29 ENCOUNTER — Other Ambulatory Visit: Payer: Self-pay | Admitting: Internal Medicine

## 2023-09-03 ENCOUNTER — Other Ambulatory Visit: Payer: Self-pay | Admitting: Internal Medicine

## 2023-09-03 ENCOUNTER — Inpatient Hospital Stay: Payer: Medicare Other | Attending: Physician Assistant

## 2023-09-03 DIAGNOSIS — C9000 Multiple myeloma not having achieved remission: Secondary | ICD-10-CM | POA: Insufficient documentation

## 2023-09-03 LAB — CMP (CANCER CENTER ONLY)
ALT: 17 U/L (ref 0–44)
AST: 16 U/L (ref 15–41)
Albumin: 4.3 g/dL (ref 3.5–5.0)
Alkaline Phosphatase: 79 U/L (ref 38–126)
Anion gap: 7 (ref 5–15)
BUN: 8 mg/dL (ref 6–20)
CO2: 27 mmol/L (ref 22–32)
Calcium: 9.1 mg/dL (ref 8.9–10.3)
Chloride: 108 mmol/L (ref 98–111)
Creatinine: 0.67 mg/dL (ref 0.44–1.00)
GFR, Estimated: >60 mL/min (ref >60)
Glucose, Bld: 99 mg/dL (ref 70–99)
Potassium: 3.4 mmol/L — ABNORMAL LOW (ref 3.5–5.1)
Sodium: 142 mmol/L (ref 135–145)
Total Bilirubin: 0.5 mg/dL (ref 0.0–1.2)
Total Protein: 7.6 g/dL (ref 6.5–8.1)

## 2023-09-03 LAB — CBC WITH DIFFERENTIAL (CANCER CENTER ONLY)
Abs Immature Granulocytes: 0.05 10*3/uL (ref 0.00–0.07)
Basophils Absolute: 0.1 10*3/uL (ref 0.0–0.1)
Basophils Relative: 1 %
Eosinophils Absolute: 0.3 10*3/uL (ref 0.0–0.5)
Eosinophils Relative: 4 %
HCT: 35.3 % — ABNORMAL LOW (ref 36.0–46.0)
Hemoglobin: 11.7 g/dL — ABNORMAL LOW (ref 12.0–15.0)
Immature Granulocytes: 1 %
Lymphocytes Relative: 16 %
Lymphs Abs: 1.2 10*3/uL (ref 0.7–4.0)
MCH: 29 pg (ref 26.0–34.0)
MCHC: 33.1 g/dL (ref 30.0–36.0)
MCV: 87.4 fL (ref 80.0–100.0)
Monocytes Absolute: 1.1 10*3/uL — ABNORMAL HIGH (ref 0.1–1.0)
Monocytes Relative: 14 %
Neutro Abs: 4.7 10*3/uL (ref 1.7–7.7)
Neutrophils Relative %: 64 %
Platelet Count: 222 10*3/uL (ref 150–400)
RBC: 4.04 MIL/uL (ref 3.87–5.11)
RDW: 13.9 % (ref 11.5–15.5)
WBC Count: 7.4 10*3/uL (ref 4.0–10.5)
nRBC: 0 % (ref 0.0–0.2)

## 2023-09-03 LAB — LACTATE DEHYDROGENASE: LDH: 201 U/L — ABNORMAL HIGH (ref 98–192)

## 2023-09-06 ENCOUNTER — Other Ambulatory Visit: Payer: Self-pay | Admitting: Internal Medicine

## 2023-09-06 DIAGNOSIS — R002 Palpitations: Secondary | ICD-10-CM

## 2023-09-06 LAB — MULTIPLE MYELOMA PANEL, SERUM
Albumin SerPl Elph-Mcnc: 3.6 g/dL (ref 2.9–4.4)
Albumin/Glob SerPl: 1.1 (ref 0.7–1.7)
Alpha 1: 0.2 g/dL (ref 0.0–0.4)
Alpha2 Glob SerPl Elph-Mcnc: 0.8 g/dL (ref 0.4–1.0)
B-Globulin SerPl Elph-Mcnc: 1.1 g/dL (ref 0.7–1.3)
Gamma Glob SerPl Elph-Mcnc: 1.4 g/dL (ref 0.4–1.8)
Globulin, Total: 3.5 g/dL (ref 2.2–3.9)
IgA: 217 mg/dL (ref 87–352)
IgG (Immunoglobin G), Serum: 1380 mg/dL (ref 586–1602)
IgM (Immunoglobulin M), Srm: 37 mg/dL (ref 26–217)
Total Protein ELP: 7.1 g/dL (ref 6.0–8.5)

## 2023-09-06 LAB — KAPPA/LAMBDA LIGHT CHAINS
Kappa free light chain: 22.5 mg/L — ABNORMAL HIGH (ref 3.3–19.4)
Kappa, lambda light chain ratio: 1.32 (ref 0.26–1.65)
Lambda free light chains: 17 mg/L (ref 5.7–26.3)

## 2023-09-09 ENCOUNTER — Other Ambulatory Visit: Payer: Self-pay | Admitting: *Deleted

## 2023-09-09 DIAGNOSIS — C9000 Multiple myeloma not having achieved remission: Secondary | ICD-10-CM

## 2023-09-09 MED ORDER — LENALIDOMIDE 10 MG PO CAPS
10.0000 mg | ORAL_CAPSULE | Freq: Every day | ORAL | 0 refills | Status: DC
Start: 2023-09-09 — End: 2023-10-04

## 2023-09-11 ENCOUNTER — Encounter: Payer: Self-pay | Admitting: Internal Medicine

## 2023-09-11 NOTE — Progress Notes (Signed)
   Subjective:    Patient ID: Sonya Franklin, female    DOB: 1966-02-18, 58 y.o.   MRN: [id number]  HPI 58 year old Female seen today for an acute visit. Patient contacted the office with request for Doxycycline .Had onset of sore throat symptoms yesterday. No fever or chills. Did have tick bite recently and is concerned about that as well.Patient is followed by Dr. Federico for Multiple myeloma and receives chemotherapy..Sees Dr. Mercie, Endocrinologist, for thyroid  nodules and has hx of subclinical hyperthyroidism.for which she underwent radioactive iodine therapy.Had CBC and C-met June 20th. Results reviewed.    Review of Systems no documented fever, shaking chills or headache. Some malaise and fatigue.       Objective:   Physical Exam   Afebrile.TMs are clear. Pharynx is without exudate. Neck supple. Chest is clear.  Rapid strep screen is negative.  Tick bite does not look to be secondarily infected.      Assessment & Plan:   Acute pharyngitis- rapid strep screen is negative.Prescribed Zithromax  Z pak to take 2 tabs day 1 followed by one tab days 2-5. Tick bite- continue to monitor Hx of multiple myeloma- has musculoskeletal pain that generally responds to Depomedrol 80 mg IM. Given one dose in office today. Rest and stay well hydrated. Call if not better in 24-48 hours or sooner if worse.

## 2023-09-11 NOTE — Patient Instructions (Signed)
 I do not think you have [facility] spotted fever.  Rapid strep screen is negative.  You do seem to have an acute pharyngitis and we will treat this with Zithromax  Z-PAK 2 tabs day 1 followed by 1 tab days 2 through 5.  For musculoskeletal pain, given Depo-Medrol  80 mg IM in the office today which yiu generally responds well to this.  Rest and stay well-hydrated.  Call if not better in 24 to 48 hours or sooner if worse.

## 2023-09-13 ENCOUNTER — Encounter: Payer: Self-pay | Admitting: Cardiovascular Disease

## 2023-09-13 ENCOUNTER — Ambulatory Visit: Attending: Cardiovascular Disease | Admitting: Cardiovascular Disease

## 2023-09-13 ENCOUNTER — Ambulatory Visit

## 2023-09-13 VITALS — BP 126/74 | HR 49 | Ht 61.0 in | Wt 165.8 lb

## 2023-09-13 DIAGNOSIS — R002 Palpitations: Secondary | ICD-10-CM | POA: Diagnosis not present

## 2023-09-13 DIAGNOSIS — R011 Cardiac murmur, unspecified: Secondary | ICD-10-CM | POA: Diagnosis not present

## 2023-09-13 DIAGNOSIS — I10 Essential (primary) hypertension: Secondary | ICD-10-CM | POA: Diagnosis not present

## 2023-09-13 DIAGNOSIS — I6523 Occlusion and stenosis of bilateral carotid arteries: Secondary | ICD-10-CM | POA: Diagnosis not present

## 2023-09-13 NOTE — Assessment & Plan Note (Signed)
 History of bilateral carotid bruits with unremarkable carotid Dopplers performed 04/09/2020.  We will repeat carotid Doppler studies.

## 2023-09-13 NOTE — Progress Notes (Unsigned)
 Enrolled patient for a 14 day Zio XT  monitor to be mailed to patients home

## 2023-09-13 NOTE — Assessment & Plan Note (Signed)
 History of essential hypertension with blood pressure measured today at 126/74.  She is on amlodipine , HydroDIURIL  and metoprolol .

## 2023-09-13 NOTE — Progress Notes (Signed)
 09/13/2023 Sonya Franklin   04-Nov-1965  [id number]  Primary Physician Baxley, Ronal PARAS, MD Primary Cardiologist: Dorn PARAS Lesches MD GENI CODY MADEIRA, FSCAI  HPI:  Sonya Franklin is a 58 y.o. moderately overweight divorced African-American female mother of 1 daughter he was seen Dr. Annabella Scarce 2 years ago in the advanced hypertension clinic.  She was referred by Dr. Perri, her PCP, because of palpitations.  I apparently saw her remotely.  She is currently on disability because of her cancer but before that worked at [employer].  Risk factors include treated hypertension.  There is no family history for heart disease.  She is never had a heart attack or stroke.  She denies chest pain or shortness of breath.  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  She was diagnosed with multiple myeloma 1 year ago and placed on Revlimid  which apparently has a 5% risk of palpitations and SVT.  Dr. Perri put her on a beta-blocker which has improved her symptoms.   Current Meds  Medication Sig   acetaminophen  (TYLENOL ) 500 MG tablet Take 500 mg by mouth every 6 (six) hours as needed for moderate pain.   acyclovir  (ZOVIRAX ) 400 MG tablet TAKE 1 TABLET BY MOUTH TWICE A DAY   ALPRAZolam  (XANAX ) 0.5 MG tablet Take 1 tablet (0.5 mg total) by mouth 3 (three) times daily as needed for anxiety.   amLODipine  (NORVASC ) 5 MG tablet TAKE 1 TABLET (5 MG TOTAL) BY MOUTH DAILY.   aspirin EC 81 MG tablet Take 81 mg by mouth daily. Swallow whole.   dicyclomine  (BENTYL ) 20 MG tablet One tab before meals and at bedtime as needed for diarrhea with irritable bowel syndrome   furosemide  (LASIX ) 20 MG tablet TAKE 1 TABLET BY MOUTH EVERY DAY AS NEEDED   hydrochlorothiazide  (HYDRODIURIL ) 25 MG tablet TAKE 1 TABLET (25 MG TOTAL) BY MOUTH DAILY.   HYDROcodone -acetaminophen  (NORCO) 10-325 MG tablet One half to one tab by mouth with food every 8-12 hours for  pain.   KLOR-CON  M20 20 MEQ tablet TAKE 2 TABLETS BY MOUTH EVERY DAY   lenalidomide  (REVLIMID ) 10 MG capsule Take 1 capsule (10 mg total) by mouth daily. Auth # [id number] Date Obtained 09/09/23  Take 1 capsule daily for 21 days then none for 7 days   lidocaine  (LIDODERM ) 5 % Place 1 patch onto the skin daily. Remove & Discard patch within 12 hours or as directed by MD   metoprolol  tartrate (LOPRESSOR ) 25 MG tablet TAKE 1 TAB BY MOUTH UP TO TWICE DAILY IF NEEDED FOR PALPITATIONS   nystatin  (MYCOSTATIN ) 100000 UNIT/ML suspension Take 5 mLs (500,000 Units total) by mouth 4 (four) times daily.   olopatadine  (PATANOL) 0.1 % ophthalmic solution Place 1 drop into both eyes 2 (two) times daily as needed.   pantoprazole  (PROTONIX ) 40 MG tablet TAKE 1 TABLET BY MOUTH EVERY DAY   triamcinolone  cream (KENALOG ) 0.1 % Apply 1 Application topically 2 (two) times daily.     Allergies  Allergen Reactions   Shellfish Allergy Anaphylaxis   Blue Dyes (Parenteral)    Hydrochlorothiazide  Itching    Social History   Socioeconomic History   Marital status: Divorced    Spouse name: Not on file   Number of children: 1   Years of education: Not on file   Highest education level: Not on file  Occupational History    Employer: UNEMPLOYED  Tobacco Use  Smoking status: Never   Smokeless tobacco: Never  Vaping Use   Vaping status: Never Used  Substance and Sexual Activity   Alcohol use: No   Drug use: No   Sexual activity: Yes  Other Topics Concern   Not on file  Social History Narrative   Caffeine occasional drinker.   Not working/disabled.  12th grader. Divorced, one kids.    Receives disability benefits due to hx of breast cancer 2007 and ankylosing spondylitis. Divorced. 1 special needs son who she resides. Non-smoker and no alcohol consumption. Occasionally drinks caffeine.    Right handed   Caffeine occas   One level stays on this level      Fhx:   Maternal Grandmother w/ hx of Goiter   Mother  w/ hx of Breast Cancer, diagnosed at 30, Diabetes, Hyperlipidemia, Hypertension, and Thyroid  Disease.    Father w/ hx of Prostate Cancer, High Cholesterol, and High Blood Pressure     Maternal Aunt w/ hx of Breast Cancer.    3 Brothers - 1 w/ hx of Colitis.   Social Drivers of Corporate investment banker Strain: Low Risk  (08/26/2021)   Overall Financial Resource Strain (CARDIA)    Difficulty of Paying Living Expenses: Not hard at all  Food Insecurity: No Food Insecurity (05/25/2022)   Hunger Vital Sign    Worried About Running Out of Food in the Last Year: Never true    Ran Out of Food in the Last Year: Never true  Transportation Needs: No Transportation Needs (08/26/2021)   PRAPARE - Administrator, Civil Service (Medical): No    Lack of Transportation (Non-Medical): No  Physical Activity: Inactive (08/26/2021)   Exercise Vital Sign    Days of Exercise per Week: 0 days    Minutes of Exercise per Session: 0 min  Stress: Not on file  Social Connections: Not on file  Intimate Partner Violence: Not At Risk (04/21/2022)   Humiliation, Afraid, Rape, and Kick questionnaire    Fear of Current or Ex-Partner: No    Emotionally Abused: No    Physically Abused: No    Sexually Abused: No     Review of Systems: General: negative for chills, fever, night sweats or weight changes.  Cardiovascular: negative for chest pain, dyspnea on exertion, edema, orthopnea, palpitations, paroxysmal nocturnal dyspnea or shortness of breath Dermatological: negative for rash Respiratory: negative for cough or wheezing Urologic: negative for hematuria Abdominal: negative for nausea, vomiting, diarrhea, bright red blood per rectum, melena, or hematemesis Neurologic: negative for visual changes, syncope, or dizziness All other systems reviewed and are otherwise negative except as noted above.    Blood pressure 126/74, pulse (!) 49, height 5' 1 (1.549 m), weight 165 lb 12.8 oz (75.2 kg), SpO2 99%.   General appearance: alert and no distress Neck: no adenopathy, no JVD, supple, symmetrical, trachea midline, thyroid  not enlarged, symmetric, no tenderness/mass/nodules, and soft bilateral carotid bruits Lungs: clear to auscultation bilaterally Heart: Soft outflow tract murmur Extremities: extremities normal, atraumatic, no cyanosis or edema Pulses: 2+ and symmetric Skin: Skin color, texture, turgor normal. No rashes or lesions Neurologic: Grossly normal  EKG EKG Interpretation Date/Time:  Monday September 13 2023 11:30:02 EDT Ventricular Rate:  49 PR Interval:  152 QRS Duration:  80 QT Interval:  426 QTC Calculation: 384 R Axis:   1  Text Interpretation: Sinus bradycardia When compared with ECG of 10-May-2022 12:07, PREVIOUS ECG IS PRESENT Confirmed by Court Carrier [phone]) on 09/13/2023 11:44:15 AM  ASSESSMENT AND PLAN:   Benign essential HTN History of essential hypertension with blood pressure measured today at 126/74.  She is on amlodipine , HydroDIURIL  and metoprolol .  Carotid stenosis History of bilateral carotid bruits with unremarkable carotid Dopplers performed 04/09/2020.  We will repeat carotid Doppler studies.  Palpitations History of palpitations which began 1 year ago when she was diagnosed with multiple myeloma and placed on a drug called Revlimid .  She was put on Toprol  XL which has somewhat improved her symptoms.  I am going to get a 2-week Zio patch to further evaluate.     Dorn DOROTHA Lesches MD FACP,FACC,FAHA, [facility] 09/13/2023 11:56 AM

## 2023-09-13 NOTE — Assessment & Plan Note (Signed)
 History of palpitations which began 1 year ago when she was diagnosed with multiple myeloma and placed on a drug called Revlimid .  She was put on Toprol  XL which has somewhat improved her symptoms.  I am going to get a 2-week Zio patch to further evaluate.

## 2023-09-13 NOTE — Patient Instructions (Signed)
 Medication Instructions:  No changes     Lab Work:  Not needed.   Testing/Procedures: 1) Your physician has requested that you have an echocardiogram. Echocardiography is a painless test that uses sound waves to create images of your heart. It provides your doctor with information about the size and shape of your heart and how well your heart's chambers and valves are working. This procedure takes approximately one hour. There are no restrictions for this procedure. Please do NOT wear cologne, perfume, aftershave, or lotions (deodorant is allowed). Please arrive 15 minutes prior to your appointment time.  Please note: We ask at that you not bring children with you during ultrasound (echo/ vascular) testing. Due to room size and safety concerns, children are not allowed in the ultrasound rooms during exams. Our front office staff cannot provide observation of children in our lobby area while testing is being conducted. An adult accompanying a patient to their appointment will only be allowed in the ultrasound room at the discretion of the ultrasound technician under special circumstances. We apologize for any inconvenience.   2) Your physician has requested that you have a carotid duplex. This test is an ultrasound of the carotid arteries in your neck. It looks at blood flow through these arteries that supply the brain with blood. Allow one hour for this exam. There are no restrictions or special instructions.   3) Your physician has recommended that you wear a holter monitor 14 day Zio . Holter monitors are medical devices that record the heart's electrical activity. Doctors most often use these monitors to diagnose arrhythmias. Arrhythmias are problems with the speed or rhythm of the heartbeat. The monitor is a small, portable device. You can wear one while you do your normal daily activities. This is usually used to diagnose what is causing palpitations/syncope (passing out).   Follow-Up: At  [facility], you and your health needs are our priority.  As part of our continuing mission to provide you with exceptional heart care, we have created designated Provider Care Teams.  These Care Teams include your primary Cardiologist (physician) and Advanced Practice Providers (APPs -  Physician Assistants and Nurse Practitioners) who all work together to provide you with the care you need, when you need it.     Your next appointment:   3 month(s)  The format for your next appointment:   In Person  Provider:   Aline Door, PA-C, Damien Braver, NP, or Katlyn West, NP      Then, Dorn Lesches, MD will plan to see you again in 6 month(s).   Other instructions   ZIO XT- Long Term Monitor Instructions  Your physician has requested you wear a ZIO patch monitor for 14 days.  This is a single patch monitor. [employer] supplies one patch monitor per enrollment. Additional stickers are not available. Please do not apply patch if you will be having a Nuclear Stress Test,  Echocardiogram, Cardiac CT, MRI, or Chest Xray during the period you would be wearing the  monitor. The patch cannot be worn during these tests. You cannot remove and re-apply the  ZIO XT patch monitor.  Your ZIO patch monitor will be mailed 3 day USPS to your address on file. It may take 3-5 days  to receive your monitor after you have been enrolled.  Once you have received your monitor, please review the enclosed instructions. Your monitor  has already been registered assigning a specific monitor serial # to you.  Billing and Patient Assistance  Program Information  We have supplied [employer] with any of your insurance information on file for billing purposes. [employer] offers a sliding scale Patient Assistance Program for patients that do not have  insurance, or whose insurance does not completely cover the cost of the ZIO monitor.  You must apply for the Patient Assistance Program to qualify for this discounted rate.   To apply, please call [employer] at [phone], select option 4, select option 2, ask to apply for  Patient Assistance Program. Meredeth will ask your household income, and how many people  are in your household. They will quote your out-of-pocket cost based on that information.  [employer] will also be able to set up a 46-month, interest-free payment plan if needed.  Applying the monitor   Shave hair from upper left chest.  Hold abrader disc by orange tab. Rub abrader in 40 strokes over the upper left chest as  indicated in your monitor instructions.  Clean area with 4 enclosed alcohol pads. Let dry.  Apply patch as indicated in monitor instructions. Patch will be placed under collarbone on left  side of chest with arrow pointing upward.  Rub patch adhesive wings for 2 minutes. Remove white label marked 1. Remove the white  label marked 2. Rub patch adhesive wings for 2 additional minutes.  While looking in a mirror, press and release button in center of patch. A small green light will  flash 3-4 times. This will be your only indicator that the monitor has been turned on.  Do not shower for the first 24 hours. You may shower after the first 24 hours.  Press the button if you feel a symptom. You will hear a small click. Record Date, Time and  Symptom in the Patient Logbook.  When you are ready to remove the patch, follow instructions on the last 2 pages of Patient  Logbook. Stick patch monitor onto the last page of Patient Logbook.  Place Patient Logbook in the blue and white box. Use locking tab on box and tape box closed  securely. The blue and white box has prepaid postage on it. Please place it in the mailbox as  soon as possible. Your physician should have your test results approximately 7 days after the  monitor has been mailed back to [facility].  Call [facility] Customer Care at [phone] if you have questions regarding  your ZIO XT patch monitor. Call them immediately  if you see an orange light blinking on your  monitor.  If your monitor falls off in less than 4 days, contact our Monitor department at [phone].  If your monitor becomes loose or falls off after 4 days call [employer] at [phone] for  suggestions on securing your monitor

## 2023-10-01 ENCOUNTER — Inpatient Hospital Stay: Payer: Medicare Other | Attending: Physician Assistant

## 2023-10-01 DIAGNOSIS — C9000 Multiple myeloma not having achieved remission: Secondary | ICD-10-CM | POA: Diagnosis not present

## 2023-10-01 LAB — CBC WITH DIFFERENTIAL (CANCER CENTER ONLY)
Abs Immature Granulocytes: 0.03 K/uL (ref 0.00–0.07)
Basophils Absolute: 0.1 K/uL (ref 0.0–0.1)
Basophils Relative: 1 %
Eosinophils Absolute: 0.2 K/uL (ref 0.0–0.5)
Eosinophils Relative: 2 %
HCT: 36.3 % (ref 36.0–46.0)
Hemoglobin: 12 g/dL (ref 12.0–15.0)
Immature Granulocytes: 0 %
Lymphocytes Relative: 13 %
Lymphs Abs: 1.1 K/uL (ref 0.7–4.0)
MCH: 29.2 pg (ref 26.0–34.0)
MCHC: 33.1 g/dL (ref 30.0–36.0)
MCV: 88.3 fL (ref 80.0–100.0)
Monocytes Absolute: 0.8 K/uL (ref 0.1–1.0)
Monocytes Relative: 10 %
Neutro Abs: 5.9 K/uL (ref 1.7–7.7)
Neutrophils Relative %: 74 %
Platelet Count: 217 K/uL (ref 150–400)
RBC: 4.11 MIL/uL (ref 3.87–5.11)
RDW: 14.4 % (ref 11.5–15.5)
WBC Count: 8.1 K/uL (ref 4.0–10.5)
nRBC: 0 % (ref 0.0–0.2)

## 2023-10-01 LAB — CMP (CANCER CENTER ONLY)
ALT: 15 U/L (ref 0–44)
AST: 12 U/L — ABNORMAL LOW (ref 15–41)
Albumin: 4.2 g/dL (ref 3.5–5.0)
Alkaline Phosphatase: 84 U/L (ref 38–126)
Anion gap: 6 (ref 5–15)
BUN: 10 mg/dL (ref 6–20)
CO2: 29 mmol/L (ref 22–32)
Calcium: 9.2 mg/dL (ref 8.9–10.3)
Chloride: 108 mmol/L (ref 98–111)
Creatinine: 0.73 mg/dL (ref 0.44–1.00)
GFR, Estimated: >60 mL/min (ref >60)
Glucose, Bld: 94 mg/dL (ref 70–99)
Potassium: 3.5 mmol/L (ref 3.5–5.1)
Sodium: 143 mmol/L (ref 135–145)
Total Bilirubin: 0.3 mg/dL (ref 0.0–1.2)
Total Protein: 7.6 g/dL (ref 6.5–8.1)

## 2023-10-01 LAB — LACTATE DEHYDROGENASE: LDH: 174 U/L (ref 98–192)

## 2023-10-04 ENCOUNTER — Other Ambulatory Visit: Payer: Self-pay | Admitting: *Deleted

## 2023-10-04 DIAGNOSIS — C9000 Multiple myeloma not having achieved remission: Secondary | ICD-10-CM

## 2023-10-04 LAB — MULTIPLE MYELOMA PANEL, SERUM
Albumin SerPl Elph-Mcnc: 3.8 g/dL (ref 2.9–4.4)
Albumin/Glob SerPl: 1.1 (ref 0.7–1.7)
Alpha 1: 0.3 g/dL (ref 0.0–0.4)
Alpha2 Glob SerPl Elph-Mcnc: 0.8 g/dL (ref 0.4–1.0)
B-Globulin SerPl Elph-Mcnc: 1.2 g/dL (ref 0.7–1.3)
Gamma Glob SerPl Elph-Mcnc: 1.3 g/dL (ref 0.4–1.8)
Globulin, Total: 3.6 g/dL (ref 2.2–3.9)
IgA: 223 mg/dL (ref 87–352)
IgG (Immunoglobin G), Serum: 1252 mg/dL (ref 586–1602)
IgM (Immunoglobulin M), Srm: 31 mg/dL (ref 26–217)
Total Protein ELP: 7.4 g/dL (ref 6.0–8.5)

## 2023-10-04 LAB — KAPPA/LAMBDA LIGHT CHAINS
Kappa free light chain: 20.5 mg/L — ABNORMAL HIGH (ref 3.3–19.4)
Kappa, lambda light chain ratio: 1.39 (ref 0.26–1.65)
Lambda free light chains: 14.7 mg/L (ref 5.7–26.3)

## 2023-10-04 MED ORDER — LENALIDOMIDE 10 MG PO CAPS: 10.0000 mg | ORAL_CAPSULE | Freq: Every day | ORAL | 0 refills | Status: DC

## 2023-10-13 ENCOUNTER — Ambulatory Visit (INDEPENDENT_AMBULATORY_CARE_PROVIDER_SITE_OTHER)

## 2023-10-13 VITALS — Ht 61.0 in | Wt 165.0 lb

## 2023-10-13 DIAGNOSIS — Z0000 Encounter for general adult medical examination without abnormal findings: Secondary | ICD-10-CM | POA: Diagnosis not present

## 2023-10-13 NOTE — Patient Instructions (Signed)
 Next appointment: Follow up in one year for your annual wellness visit    Preventive Care 58 Years and Older, Female Preventive care refers to lifestyle choices and visits with your health care provider that can promote health and wellness. What does preventive care include? A yearly physical exam. This is also called an annual well check. Dental exams once or twice a year. Routine eye exams. Ask your health care provider how often you should have your eyes checked. Personal lifestyle choices, including: Daily care of your teeth and gums. Regular physical activity. Eating a healthy diet. Avoiding tobacco and drug use. Limiting alcohol use. Practicing safe sex. Taking low-dose aspirin every day. Taking vitamin and mineral supplements as recommended by your health care provider. What happens during an annual well check? The services and screenings done by your health care provider during your annual well check will depend on your age, overall health, lifestyle risk factors, and family history of disease. Counseling  Your health care provider may ask you questions about your: Alcohol use. Tobacco use. Drug use. Emotional well-being. Home and relationship well-being. Sexual activity. Eating habits. History of falls. Memory and ability to understand (cognition). Work and work Astronomer. Reproductive health. Screening  You may have the following tests or measurements: Height, weight, and BMI. Blood pressure. Lipid and cholesterol levels. These may be checked every 5 years, or more frequently if you are over 1 years old. Skin check. Lung cancer screening. You may have this screening every year starting at age 58 if you have a 30-pack-year history of smoking and currently smoke or have quit within the past 15 years. Fecal occult blood test (FOBT) of the stool. You may have this test every year starting at age 58. Flexible sigmoidoscopy or colonoscopy. You may have a  sigmoidoscopy every 5 years or a colonoscopy every 10 years starting at age 58. Hepatitis C blood test. Hepatitis B blood test. Sexually transmitted disease (STD) testing. Diabetes screening. This is done by checking your blood sugar (glucose) after you have not eaten for a while (fasting). You may have this done every 1-3 years. Bone density scan. This is done to screen for osteoporosis. You may have this done starting at age 58. Mammogram. This may be done every 1-2 years. Talk to your health care provider about how often you should have regular mammograms. Talk with your health care provider about your test results, treatment options, and if necessary, the need for more tests. Vaccines  Your health care provider may recommend certain vaccines, such as: Influenza vaccine. This is recommended every year. Tetanus, diphtheria, and acellular pertussis (Tdap, Td) vaccine. You may need a Td booster every 10 years. Zoster vaccine. You may need this after age 58. Pneumococcal 13-valent conjugate (PCV13) vaccine. One dose is recommended after age 58. Pneumococcal polysaccharide (PPSV23) vaccine. One dose is recommended after age 58. Talk to your health care provider about which screenings and vaccines you need and how often you need them. This information is not intended to replace advice given to you by your health care provider. Make sure you discuss any questions you have with your health care provider. Document Released: 03/29/2015 Document Revised: 11/20/2015 Document Reviewed: 01/01/2015 Elsevier Interactive Patient Education  2017 [employer].  Fall Prevention in the Home Falls can cause injuries. They can happen to people of all ages. There are many things you can do to make your home safe and to help prevent falls. What can I do on the outside of  my home? Regularly fix the edges of walkways and driveways and fix any cracks. Remove anything that might make you trip as you walk through a  door, such as a raised step or threshold. Trim any bushes or trees on the path to your home. Use bright outdoor lighting. Clear any walking paths of anything that might make someone trip, such as rocks or tools. Regularly check to see if handrails are loose or broken. Make sure that both sides of any steps have handrails. Any raised decks and porches should have guardrails on the edges. Have any leaves, snow, or ice cleared regularly. Use sand or salt on walking paths during winter. Clean up any spills in your garage right away. This includes oil or grease spills. What can I do in the bathroom? Use night lights. Install grab bars by the toilet and in the tub and shower. Do not use towel bars as grab bars. Use non-skid mats or decals in the tub or shower. If you need to sit down in the shower, use a plastic, non-slip stool. Keep the floor dry. Clean up any water that spills on the floor as soon as it happens. Remove soap buildup in the tub or shower regularly. Attach bath mats securely with double-sided non-slip rug tape. Do not have throw rugs and other things on the floor that can make you trip. What can I do in the bedroom? Use night lights. Make sure that you have a light by your bed that is easy to reach. Do not use any sheets or blankets that are too big for your bed. They should not hang down onto the floor. Have a firm chair that has side arms. You can use this for support while you get dressed. Do not have throw rugs and other things on the floor that can make you trip. What can I do in the kitchen? Clean up any spills right away. Avoid walking on wet floors. Keep items that you use a lot in easy-to-reach places. If you need to reach something above you, use a strong step stool that has a grab bar. Keep electrical cords out of the way. Do not use floor polish or wax that makes floors slippery. If you must use wax, use non-skid floor wax. Do not have throw rugs and other things  on the floor that can make you trip. What can I do with my stairs? Do not leave any items on the stairs. Make sure that there are handrails on both sides of the stairs and use them. Fix handrails that are broken or loose. Make sure that handrails are as long as the stairways. Check any carpeting to make sure that it is firmly attached to the stairs. Fix any carpet that is loose or worn. Avoid having throw rugs at the top or bottom of the stairs. If you do have throw rugs, attach them to the floor with carpet tape. Make sure that you have a light switch at the top of the stairs and the bottom of the stairs. If you do not have them, ask someone to add them for you. What else can I do to help prevent falls? Wear shoes that: Do not have high heels. Have rubber bottoms. Are comfortable and fit you well. Are closed at the toe. Do not wear sandals. If you use a stepladder: Make sure that it is fully opened. Do not climb a closed stepladder. Make sure that both sides of the stepladder are locked into place. Ask  someone to hold it for you, if possible. Clearly mark and make sure that you can see: Any grab bars or handrails. First and last steps. Where the edge of each step is. Use tools that help you move around (mobility aids) if they are needed. These include: Canes. Walkers. Scooters. Crutches. Turn on the lights when you go into a dark area. Replace any light bulbs as soon as they burn out. Set up your furniture so you have a clear path. Avoid moving your furniture around. If any of your floors are uneven, fix them. If there are any pets around you, be aware of where they are. Review your medicines with your doctor. Some medicines can make you feel dizzy. This can increase your chance of falling. Ask your doctor what other things that you can do to help prevent falls. This information is not intended to replace advice given to you by your health care provider. Make sure you discuss any  questions you have with your health care provider. Document Released: 12/27/2008 Document Revised: 08/08/2015 Document Reviewed: 04/06/2014 Elsevier Interactive Patient Education  2017 [employer].

## 2023-10-13 NOTE — Progress Notes (Signed)
 Subjective:   Sonya Franklin is a 58 y.o. female who presents for Medicare Annual (Subsequent) preventive examination.  Visit Complete: Virtual I connected with  Sonya Franklin on 10/13/23 by a audio enabled telemedicine application and verified that I am speaking with the correct person using two identifiers.  Patient Location: Home  Provider Location: Office/Clinic  I discussed the limitations of evaluation and management by telemedicine. The patient expressed understanding and agreed to proceed.  Vital Signs: Because this visit was a virtual/telehealth visit, some criteria may be missing or patient reported. Any vitals not documented were not able to be obtained and vitals that have been documented are patient reported.  Patient Medicare AWV questionnaire was completed by the patient on 10/13/2023; I have confirmed that all information answered by patient is correct and no changes since this date.  Cardiac Risk Factors include: advanced age (>71men, >71 women);hypertension;obesity (BMI >30kg/m2);sedentary lifestyle     Objective:    Today's Vitals   10/13/23 1020 10/13/23 1039  Weight: 165 lb (74.8 kg)   Height: 5' 1 (1.549 m)   PainSc: 8  8   PainLoc: Generalized    Body mass index is 31.18 kg/m.     10/13/2023   10:34 AM 06/28/2023    2:46 PM 10/14/2022    8:23 AM 07/15/2022    9:37 AM 07/12/2022    7:17 AM 05/25/2022   11:19 AM 05/10/2022    3:35 PM  Advanced Directives  Does Patient Have a Medical Advance Directive? No Yes No No No No No  Does patient want to make changes to medical advance directive?  No - Patient declined       Would patient like information on creating a medical advance directive? No - Patient declined  No - Patient declined No - Patient declined       Current Medications (verified) Outpatient Encounter Medications as of 10/13/2023  Medication Sig   acetaminophen  (TYLENOL ) 500 MG tablet Take 500 mg by mouth every 6 (six) hours as needed for  moderate pain.   acyclovir  (ZOVIRAX ) 400 MG tablet TAKE 1 TABLET BY MOUTH TWICE A DAY   ALPRAZolam  (XANAX ) 0.5 MG tablet Take 1 tablet (0.5 mg total) by mouth 3 (three) times daily as needed for anxiety.   amLODipine  (NORVASC ) 5 MG tablet TAKE 1 TABLET (5 MG TOTAL) BY MOUTH DAILY.   aspirin EC 81 MG tablet Take 81 mg by mouth daily. Swallow whole.   dicyclomine  (BENTYL ) 20 MG tablet One tab before meals and at bedtime as needed for diarrhea with irritable bowel syndrome   furosemide  (LASIX ) 20 MG tablet TAKE 1 TABLET BY MOUTH EVERY DAY AS NEEDED   hydrochlorothiazide  (HYDRODIURIL ) 25 MG tablet TAKE 1 TABLET (25 MG TOTAL) BY MOUTH DAILY.   KLOR-CON  M20 20 MEQ tablet TAKE 2 TABLETS BY MOUTH EVERY DAY   lenalidomide  (REVLIMID ) 10 MG capsule Take 1 capsule (10 mg total) by mouth daily. Auth # [id number] Date Obtained 10/04/23  Take 1 capsule daily for 21 days then none for 7 days   lidocaine  (LIDODERM ) 5 % Place 1 patch onto the skin daily. Remove & Discard patch within 12 hours or as directed by MD   metoprolol  tartrate (LOPRESSOR ) 25 MG tablet TAKE 1 TAB BY MOUTH UP TO TWICE DAILY IF NEEDED FOR PALPITATIONS   nystatin  (MYCOSTATIN ) 100000 UNIT/ML suspension Take 5 mLs (500,000 Units total) by mouth 4 (four) times daily.   olopatadine  (PATANOL) 0.1 % ophthalmic solution Place 1  drop into both eyes 2 (two) times daily as needed.   pantoprazole  (PROTONIX ) 40 MG tablet TAKE 1 TABLET BY MOUTH EVERY DAY   triamcinolone  cream (KENALOG ) 0.1 % Apply 1 Application topically 2 (two) times daily.   [DISCONTINUED] HYDROcodone -acetaminophen  (NORCO) 10-325 MG tablet One half to one tab by mouth with food every 8-12 hours for pain.   No facility-administered encounter medications on file as of 10/13/2023.    Allergies (verified) Shellfish allergy, Blue dyes (parenteral), and Hydrochlorothiazide    History: Past Medical History:  Diagnosis Date   Ankylosing spondylitis (HCC)    Anxiety    Arthritis    Breast  cancer (HCC)    right breast   Bursitis of hip, right    Cardiac arrhythmia    Carotid stenosis 08/26/2021   GE reflux    Hiatal hernia    Hypertension    Hyperthyroidism    Ischemic bowel disease (HCC)    Multiple myeloma not having achieved remission (HCC)    Prediabetes 08/26/2021   Snoring 03/21/2014   Vitamin D  deficiency    Past Surgical History:  Procedure Laterality Date   ABDOMINAL HYSTERECTOMY     BREAST LUMPECTOMY     right   MASTECTOMY     double   Family History  Problem Relation Age of Onset   Thyroid  disease Mother    Diabetes Mother    Hypertension Mother    Hyperlipidemia Mother    High blood pressure Father    High Cholesterol Father    Colitis Brother    Goiter Maternal Grandmother    Breast cancer Maternal Aunt    Social History   Socioeconomic History   Marital status: Divorced    Spouse name: Not on file   Number of children: 1   Years of education: Not on file   Highest education level: Not on file  Occupational History    Employer: UNEMPLOYED  Tobacco Use   Smoking status: Never   Smokeless tobacco: Never  Vaping Use   Vaping status: Never Used  Substance and Sexual Activity   Alcohol use: No   Drug use: No   Sexual activity: Yes  Other Topics Concern   Not on file  Social History Narrative   Caffeine occasional drinker.   Not working/disabled.  12th grader. Divorced, one kids.    Receives disability benefits due to hx of breast cancer 2007 and ankylosing spondylitis. Divorced. 1 special needs son who she resides. Non-smoker and no alcohol consumption. Occasionally drinks caffeine.    Right handed   Caffeine occas   One level stays on this level      Fhx:   Maternal Grandmother w/ hx of Goiter   Mother w/ hx of Breast Cancer, diagnosed at 69, Diabetes, Hyperlipidemia, Hypertension, and Thyroid  Disease.    Father w/ hx of Prostate Cancer, High Cholesterol, and High Blood Pressure     Maternal Aunt w/ hx of Breast Cancer.    3  Brothers - 1 w/ hx of Colitis.   Social Drivers of Corporate investment banker Strain: Low Risk  (10/13/2023)   Overall Financial Resource Strain (CARDIA)    Difficulty of Paying Living Expenses: Not hard at all  Food Insecurity: No Food Insecurity (10/13/2023)   Hunger Vital Sign    Worried About Running Out of Food in the Last Year: Never true    Ran Out of Food in the Last Year: Never true  Transportation Needs: No Transportation Needs (10/13/2023)  PRAPARE - Administrator, Civil Service (Medical): No    Lack of Transportation (Non-Medical): No  Physical Activity: Inactive (10/13/2023)   Exercise Vital Sign    Days of Exercise per Week: 0 days    Minutes of Exercise per Session: 0 min  Stress: No Stress Concern Present (10/13/2023)   [employer] of Occupational Health - Occupational Stress Questionnaire    Feeling of Stress: Not at all  Social Connections: Socially Isolated (10/13/2023)   Social Connection and Isolation Panel    Frequency of Communication with Friends and Family: More than three times a week    Frequency of Social Gatherings with Friends and Family: More than three times a week    Attends Religious Services: Never    Database administrator or Organizations: No    Attends Engineer, structural: Never    Marital Status: Divorced    Tobacco Counseling Counseling given: No   Clinical Intake:  Pre-visit preparation completed: Yes  Pain : 0-10 Pain Score: 8  Pain Type: Chronic pain Pain Location: Other (Comment) Pain Orientation: Other (Comment) Pain Descriptors / Indicators: Aching, Constant Pain Onset: More than a month ago Pain Frequency: Constant     BMI - recorded: 31.18 Nutritional Status: BMI > 30  Obese Nutritional Risks: None Diabetes: No  How often do you need to have someone help you when you read instructions, pamphlets, or other written materials from your doctor or pharmacy?: 1 - Never  Interpreter Needed?:  No  Information entered by :: Kathlynn Porto, CMA   Activities of Daily Living    10/13/2023   10:32 AM 06/28/2023    2:41 PM  In your present state of health, do you have any difficulty performing the following activities:  Hearing? 0 0  Vision? 0 0  Difficulty concentrating or making decisions? 0 1  Walking or climbing stairs? 0 1  Dressing or bathing? 0 0  Doing errands, shopping? 0 0  Preparing Food and eating ? N N  Using the Toilet? N N  In the past six months, have you accidently leaked urine? N N  Do you have problems with loss of bowel control? N N  Managing your Medications? N N  Managing your Finances? N Y  Housekeeping or managing your Housekeeping? N N    Patient Care Team: Perri Ronal PARAS, MD as PCP - General (Internal Medicine) Court Dorn PARAS, MD as PCP - Cardiology (Cardiology)  Indicate any recent Medical Services you may have received from other than [facility] providers in the past year (date may be approximate).     Assessment:   This is a routine wellness examination for Sonya Franklin.   Goals Addressed   None    Depression Screen    10/13/2023   10:35 AM 06/29/2022    3:24 PM 05/25/2022   10:40 AM 04/21/2022    2:04 PM 02/25/2022   11:53 AM 01/16/2022    3:25 PM 01/05/2022    3:06 PM  PHQ 2/9 Scores  PHQ - 2 Score 0 0 0 0 0 0 0  Exception Documentation    Patient refusal       Fall Risk    10/13/2023   10:30 AM 08/27/2023    4:12 PM 06/28/2023    2:47 PM 06/29/2022    3:24 PM 05/25/2022   10:40 AM  Fall Risk   Falls in the past year? 0 0 1 0 0  Number falls in past yr: 0  0 0 0 0  Injury with Fall? 0 0 0 0 0  Risk for fall due to : No Fall Risks No Fall Risks No Fall Risks No Fall Risks No Fall Risks  Follow up Education provided;Falls evaluation completed;Falls prevention discussed Falls evaluation completed Falls evaluation completed Falls prevention discussed Falls prevention discussed    MEDICARE RISK AT HOME: Medicare Risk at Home Any stairs  in or around the home?: Yes If so, are there any without handrails?: No Home free of loose throw rugs in walkways, pet beds, electrical cords, etc?: Yes Adequate lighting in your home to reduce risk of falls?: No Life alert?: No Use of a cane, walker or w/c?: No Grab bars in the bathroom?: No Shower chair or bench in shower?: No Elevated toilet seat or a handicapped toilet?: No  TIMED UP AND GO:  Was the test performed?  No    Cognitive Function:        10/13/2023   10:34 AM 06/28/2023    2:48 PM 05/25/2022   11:20 AM 04/10/2021    3:17 PM  6CIT Screen  What Year? 0 points 4 points 0 points 0 points  What month? 0 points 3 points 0 points 0 points  What time? 0 points 3 points 0 points 0 points  Count back from 20 0 points 4 points 0 points 0 points  Months in reverse 0 points 4 points 0 points 0 points  Repeat phrase 0 points 10 points 0 points 0 points  Total Score 0 points 28 points 0 points 0 points    Immunizations Immunization History  Administered Date(s) Administered   PFIZER(Purple Top)SARS-COV-2 Vaccination 06/15/2019, 07/10/2019   Tdap 01/22/2020    TDAP status: Up to date  Flu Vaccine status: Due, Education has been provided regarding the importance of this vaccine. Advised may receive this vaccine at local pharmacy or Health Dept. Aware to provide a copy of the vaccination record if obtained from local pharmacy or Health Dept. Verbalized acceptance and understanding.  Pneumococcal vaccine status: Due, Education has been provided regarding the importance of this vaccine. Advised may receive this vaccine at local pharmacy or Health Dept. Aware to provide a copy of the vaccination record if obtained from local pharmacy or Health Dept. Verbalized acceptance and understanding.  Covid-19 vaccine status: Information provided on how to obtain vaccines.   Qualifies for Shingles Vaccine? Yes   Zostavax completed No   Shingrix Completed?: No.    Education has been  provided regarding the importance of this vaccine. Patient has been advised to call insurance company to determine out of pocket expense if they have not yet received this vaccine. Advised may also receive vaccine at local pharmacy or Health Dept. Verbalized acceptance and understanding.  Screening Tests Health Maintenance  Topic Date Due   Hepatitis B Vaccines (1 of 3 - 19+ 3-dose series) Never done   Zoster Vaccines- Shingrix (1 of 2) Never done   COVID-19 Vaccine (3 - Pfizer risk series) 08/07/2019   Colonoscopy  03/04/2024 (Originally 03/21/2022)   Cervical Cancer Screening (HPV/Pap Cotest)  05/02/2024 (Originally 01/10/2017)   Pneumococcal Vaccine 61-24 Years old (1 of 2 - PCV) 05/02/2024 (Originally 05/14/1984)   INFLUENZA VACCINE  10/15/2023   OPHTHALMOLOGY EXAM  12/15/2023   HEMOGLOBIN A1C  01/08/2024   FOOT EXAM  06/27/2024   Diabetic kidney evaluation - Urine ACR  07/08/2024   Diabetic kidney evaluation - eGFR measurement  09/30/2024   Medicare Annual Wellness (AWV)  10/12/2024  DTaP/Tdap/Td (2 - Td or Tdap) 01/21/2030   HIV Screening  Completed   HPV VACCINES  Aged Out   Meningococcal B Vaccine  Aged Out   MAMMOGRAM  Discontinued   Hepatitis C Screening  Discontinued    Health Maintenance  Health Maintenance Due  Topic Date Due   Hepatitis B Vaccines (1 of 3 - 19+ 3-dose series) Never done   Zoster Vaccines- Shingrix (1 of 2) Never done   COVID-19 Vaccine (3 - Pfizer risk series) 08/07/2019    Colorectal cancer screening: Type of screening: Colonoscopy. Completed 03/05/23. Repeat every 10 years  Mammogram status: No longer required due to bilateral mastectomy.   Lung Cancer Screening: (Low Dose CT Chest recommended if Age 50-80 years, 20 pack-year currently smoking OR have quit w/in 15years.) does not qualify.    Additional Screening:  Hepatitis C Screening: does not qualify;   Vision Screening: Recommended annual ophthalmology exams for early detection of  glaucoma and other disorders of the eye. Is the patient up to date with their annual eye exam?  No  Who is the provider or what is the name of the office in which the patient attends annual eye exams? [facility] If pt is not established with a provider, would they like to be referred to a provider to establish care? No .   Dental Screening: Recommended annual dental exams for proper oral hygiene  Community Resource Referral / Chronic Care Management: CRR required this visit?  No   CCM required this visit?  No     Plan:     I have personally reviewed and noted the following in the patient's chart:   Medical and social history Use of alcohol, tobacco or illicit drugs  Current medications and supplements including opioid prescriptions. Patient is not currently taking opioid prescriptions. Functional ability and status Nutritional status Physical activity Advanced directives List of other physicians Hospitalizations, surgeries, and ER visits in previous 12 months Vitals Screenings to include cognitive, depression, and falls Referrals and appointments  In addition, I have reviewed and discussed with patient certain preventive protocols, quality metrics, and best practice recommendations. A written personalized care plan for preventive services as well as general preventive health recommendations were provided to patient.     Eann Cleland Zelda, CMA   10/13/2023   After Visit Summary: (Mail) Due to this being a telephonic visit, the after visit summary with patients personalized plan was offered to patient via mail

## 2023-10-14 ENCOUNTER — Encounter: Payer: Self-pay | Admitting: Internal Medicine

## 2023-10-14 ENCOUNTER — Ambulatory Visit: Admitting: Internal Medicine

## 2023-10-14 VITALS — BP 120/80 | HR 66 | Ht 61.0 in | Wt 166.0 lb

## 2023-10-14 DIAGNOSIS — C9000 Multiple myeloma not having achieved remission: Secondary | ICD-10-CM

## 2023-10-14 DIAGNOSIS — T8549XA Other mechanical complication of breast prosthesis and implant, initial encounter: Secondary | ICD-10-CM | POA: Diagnosis not present

## 2023-10-14 DIAGNOSIS — R002 Palpitations: Secondary | ICD-10-CM

## 2023-10-14 DIAGNOSIS — E876 Hypokalemia: Secondary | ICD-10-CM | POA: Diagnosis not present

## 2023-10-14 DIAGNOSIS — M457 Ankylosing spondylitis of lumbosacral region: Secondary | ICD-10-CM | POA: Diagnosis not present

## 2023-10-14 DIAGNOSIS — Z853 Personal history of malignant neoplasm of breast: Secondary | ICD-10-CM | POA: Diagnosis not present

## 2023-10-14 DIAGNOSIS — M7918 Myalgia, other site: Secondary | ICD-10-CM

## 2023-10-14 DIAGNOSIS — Z8639 Personal history of other endocrine, nutritional and metabolic disease: Secondary | ICD-10-CM | POA: Diagnosis not present

## 2023-10-14 DIAGNOSIS — I10 Essential (primary) hypertension: Secondary | ICD-10-CM

## 2023-10-14 MED ORDER — METHYLPREDNISOLONE ACETATE 80 MG/ML IJ SUSP
80.0000 mg | Freq: Once | INTRAMUSCULAR | Status: AC
  Administered 2023-10-14: 80 mg via INTRAMUSCULAR

## 2023-10-14 NOTE — Progress Notes (Addendum)
 Patient Care Team: Perri Ronal PARAS, MD as PCP - General (Internal Medicine) Court Dorn PARAS, MD as PCP - Cardiology (Cardiology)  Visit Date: 10/14/23  Subjective:   Chief Complaint  Patient presents with   Muscle Pain   Patient PI:Sonya Franklin,Female DOB:1965/11/27,58 y.o. FMW:[id number]   58 y.o.Female presents today for Musculoskeletal Pain. Patient has a past medical history of Ankylosing Spondylitis; Multiple Myeloma treated with Revlimid  10 mg daily x 21 days, none for 7 days. Followed by Dr. Norleen Kidney, Oncologist - 7/18 w/ Kappa free light chain 20.5. Treated with Tylenol  and occasionally Depo-Medrol  80 mg IM in this office, which she received today. She says that once she receives an injection, the next day she feels improved, and in the summer relief will last about 3 months and in the winter it varies.  Discussed her palpitations, which are managed with Metoprolol  tartrate 25 mg daily up to twice daily as needed.  Past Medical History:  Diagnosis Date   Ankylosing spondylitis (HCC)    Anxiety    Arthritis    Breast cancer (HCC)    right breast   Bursitis of hip, right    Cardiac arrhythmia    Carotid stenosis 08/26/2021   GE reflux    Hiatal hernia    Hypertension    Hyperthyroidism    Ischemic bowel disease (HCC)    Multiple myeloma not having achieved remission (HCC)    Prediabetes 08/26/2021   Snoring 03/21/2014   Vitamin D  deficiency     Allergies  Allergen Reactions   Shellfish Allergy Anaphylaxis   Blue Dyes (Parenteral)    Hydrochlorothiazide  Itching    Family History  Problem Relation Age of Onset   Thyroid  disease Mother    Diabetes Mother    Hypertension Mother    Hyperlipidemia Mother    High blood pressure Father    High Cholesterol Father    Colitis Brother    Goiter Maternal Grandmother    Breast cancer Maternal Aunt    Social History   Social History Narrative   Caffeine occasional drinker.   Not working/disabled.  12th grader.  Divorced, one kids.    Receives disability benefits due to hx of breast cancer 2007 and ankylosing spondylitis. Divorced. 1 special needs son who she resides. Non-smoker and no alcohol consumption. Occasionally drinks caffeine.    Right handed   Caffeine occas   One level stays on this level      Fhx:   Maternal Grandmother w/ hx of Goiter   Mother w/ hx of Breast Cancer, diagnosed at 24, Diabetes, Hyperlipidemia, Hypertension, and Thyroid  Disease.    Father w/ hx of Prostate Cancer, High Cholesterol, and High Blood Pressure     Maternal Aunt w/ hx of Breast Cancer.    3 Brothers - 1 w/ hx of Colitis.   Review of Systems  Musculoskeletal:  Positive for joint pain and myalgias.     Objective:  Vitals: BP 120/80   Pulse 66   Ht 5' 1 (1.549 m)   Wt 166 lb (75.3 kg)   SpO2 97%   BMI 31.37 kg/m   Physical Exam Vitals and nursing note reviewed.  Constitutional:      General: She is not in acute distress.    Appearance: Normal appearance. She is not toxic-appearing.  HENT:     Head: Normocephalic and atraumatic.  Cardiovascular:     Rate and Rhythm: Normal rate and regular rhythm. No extrasystoles are present.  Pulses: Normal pulses.     Heart sounds: Normal heart sounds. No murmur heard.    No friction rub. No gallop.  Pulmonary:     Effort: Pulmonary effort is normal. No respiratory distress.     Breath sounds: Normal breath sounds. No wheezing or rales.  Skin:    General: Skin is warm and dry.  Neurological:     Mental Status: She is alert and oriented to person, place, and time. Mental status is at baseline.  Psychiatric:        Mood and Affect: Mood normal.        Behavior: Behavior normal.        Thought Content: Thought content normal.        Judgment: Judgment normal.     Results:  Studies Obtained And Personally Reviewed By Me: Labs:     Component Value Date/Time   NA 143 10/01/2023 1336   K 3.5 10/01/2023 1336   CL 108 10/01/2023 1336   CO2 29  10/01/2023 1336   GLUCOSE 94 10/01/2023 1336   BUN 10 10/01/2023 1336   CREATININE 0.73 10/01/2023 1336   CREATININE 0.75 10/30/2022 1200   CALCIUM  9.2 10/01/2023 1336   PROT 7.6 10/01/2023 1336   ALBUMIN 4.2 10/01/2023 1336   AST 12 (L) 10/01/2023 1336   ALT 15 10/01/2023 1336   ALKPHOS 84 10/01/2023 1336   BILITOT 0.3 10/01/2023 1336   GFRNONAA >60 10/01/2023 1336   GFRNONAA 108 04/05/2020 1128   GFRAA 126 04/05/2020 1128    Lab Results  Component Value Date   WBC 8.1 10/01/2023   HGB 12.0 10/01/2023   HCT 36.3 10/01/2023   MCV 88.3 10/01/2023   PLT 217 10/01/2023   Lab Results  Component Value Date   CHOL 196 07/09/2023   HDL 96 07/09/2023   LDLCALC 86 07/09/2023   TRIG 58 07/09/2023   CHOLHDL 2.0 07/09/2023   Lab Results  Component Value Date   HGBA1C 5.7 (H) 07/09/2023    Lab Results  Component Value Date   TSH 0.93 07/19/2023    Assessment & Plan:   Musculoskeletal Pain; Ankylosing Spondylitis: treated with Tylenol  and occasionally Depo-Medrol  80 mg IM in this office, which she received today. She says that once she receives an injection, the next day she feels improved, and in the summer injection provides relief for about 3 months and in the winter it varies.  Multiple Myeloma treated with cycles of Revlimid  10 mg daily x 21 days, off  for 7 days. Followed by Dr. Norleen Kidney, Oncologist .  Dependent edema treated with Lasix   Hypokalemia treated with potassium supplement  HTN treated with amlodipine ,hydrodiuril , metoprolol    Hx of breast cancer with implants. Has implant failure and will be seeing Dr. Lowery, Plastic Surgeon soon  Palpitations managed with Metoprolol  tartrate 25 mg daily up to twice daily as needed.Monitor odered in late June results pending  Hx of goiter and multiple bilateral  thyroid  nodules c/w subclinical hyperthyroidism seen by Dr. Mercie. Hx RAI October 2024.    I,Emily Lagle,acting as a Neurosurgeon for Ronal JINNY Hailstone, MD.,have  documented all relevant documentation on the behalf of Ronal JINNY Hailstone, MD,as directed by  Ronal JINNY Hailstone, MD while in the presence of Ronal JINNY Hailstone, MD.   I, Ronal JINNY Hailstone, MD, have reviewed all documentation for this visit. The documentation on 10/14/23 for the exam, diagnosis, procedures, and orders are all accurate and complete.

## 2023-10-14 NOTE — Patient Instructions (Signed)
 Patient received  Depomedrol 80 mg IM today for musculoskeletal pain which helps her pain. Awaiting results of Zio monitor. Continue treatment for myeloma with Dr. Federico. Continue treatment for hypertension with amlodipine ,

## 2023-10-22 ENCOUNTER — Ambulatory Visit (HOSPITAL_COMMUNITY)
Admission: RE | Admit: 2023-10-22 | Discharge: 2023-10-22 | Disposition: A | Discharge status: Home / Self Care | Source: Ambulatory Visit | Attending: Cardiovascular Disease | Admitting: Cardiovascular Disease

## 2023-10-22 ENCOUNTER — Ambulatory Visit (HOSPITAL_BASED_OUTPATIENT_CLINIC_OR_DEPARTMENT_OTHER)
Admission: RE | Admit: 2023-10-22 | Discharge: 2023-10-22 | Disposition: A | Discharge status: Home / Self Care | Source: Ambulatory Visit | Attending: Cardiovascular Disease | Admitting: Cardiovascular Disease

## 2023-10-22 DIAGNOSIS — R002 Palpitations: Secondary | ICD-10-CM | POA: Diagnosis not present

## 2023-10-22 DIAGNOSIS — I6523 Occlusion and stenosis of bilateral carotid arteries: Secondary | ICD-10-CM

## 2023-10-22 DIAGNOSIS — I10 Essential (primary) hypertension: Secondary | ICD-10-CM | POA: Insufficient documentation

## 2023-10-22 DIAGNOSIS — R011 Cardiac murmur, unspecified: Secondary | ICD-10-CM | POA: Diagnosis not present

## 2023-10-22 LAB — ECHOCARDIOGRAM COMPLETE
AR max vel: 0.86 cm2
AV Area VTI: 0.87 cm2
AV Area mean vel: 0.9 cm2
AV Mean grad: 12 mmHg
AV Peak grad: 23.7 mmHg
Ao pk vel: 2.44 m/s
Area-P 1/2: 5.54 cm2
S' Lateral: 3.48 cm

## 2023-10-23 ENCOUNTER — Ambulatory Visit: Payer: Self-pay | Admitting: Cardiovascular Disease

## 2023-10-23 DIAGNOSIS — I10 Essential (primary) hypertension: Secondary | ICD-10-CM

## 2023-10-23 DIAGNOSIS — I350 Nonrheumatic aortic (valve) stenosis: Secondary | ICD-10-CM

## 2023-10-29 ENCOUNTER — Inpatient Hospital Stay: Payer: Medicare Other | Attending: Physician Assistant

## 2023-10-29 ENCOUNTER — Inpatient Hospital Stay: Payer: Medicare Other | Admitting: Hematology and Oncology

## 2023-10-29 VITALS — BP 119/76 | HR 69 | Temp 97.2°F | Resp 17 | Wt 167.0 lb

## 2023-10-29 DIAGNOSIS — Z803 Family history of malignant neoplasm of breast: Secondary | ICD-10-CM | POA: Diagnosis not present

## 2023-10-29 DIAGNOSIS — R600 Localized edema: Secondary | ICD-10-CM | POA: Diagnosis not present

## 2023-10-29 DIAGNOSIS — G893 Neoplasm related pain (acute) (chronic): Secondary | ICD-10-CM | POA: Insufficient documentation

## 2023-10-29 DIAGNOSIS — M48061 Spinal stenosis, lumbar region without neurogenic claudication: Secondary | ICD-10-CM | POA: Insufficient documentation

## 2023-10-29 DIAGNOSIS — D509 Iron deficiency anemia, unspecified: Secondary | ICD-10-CM | POA: Diagnosis not present

## 2023-10-29 DIAGNOSIS — D649 Anemia, unspecified: Secondary | ICD-10-CM | POA: Diagnosis not present

## 2023-10-29 DIAGNOSIS — C9000 Multiple myeloma not having achieved remission: Secondary | ICD-10-CM | POA: Insufficient documentation

## 2023-10-29 LAB — CMP (CANCER CENTER ONLY)
ALT: 18 U/L (ref 0–44)
AST: 14 U/L — ABNORMAL LOW (ref 15–41)
Albumin: 4.1 g/dL (ref 3.5–5.0)
Alkaline Phosphatase: 79 U/L (ref 38–126)
Anion gap: 8 (ref 5–15)
BUN: 11 mg/dL (ref 6–20)
CO2: 28 mmol/L (ref 22–32)
Calcium: 9 mg/dL (ref 8.9–10.3)
Chloride: 106 mmol/L (ref 98–111)
Creatinine: 0.78 mg/dL (ref 0.44–1.00)
GFR, Estimated: >60 mL/min (ref >60)
Glucose, Bld: 94 mg/dL (ref 70–99)
Potassium: 3.6 mmol/L (ref 3.5–5.1)
Sodium: 142 mmol/L (ref 135–145)
Total Bilirubin: 0.3 mg/dL (ref 0.0–1.2)
Total Protein: 7 g/dL (ref 6.5–8.1)

## 2023-10-29 LAB — CBC WITH DIFFERENTIAL (CANCER CENTER ONLY)
Abs Immature Granulocytes: 0.02 K/uL (ref 0.00–0.07)
Basophils Absolute: 0.1 K/uL (ref 0.0–0.1)
Basophils Relative: 1 %
Eosinophils Absolute: 0.2 K/uL (ref 0.0–0.5)
Eosinophils Relative: 4 %
HCT: 34.9 % — ABNORMAL LOW (ref 36.0–46.0)
Hemoglobin: 11.5 g/dL — ABNORMAL LOW (ref 12.0–15.0)
Immature Granulocytes: 0 %
Lymphocytes Relative: 16 %
Lymphs Abs: 0.9 K/uL (ref 0.7–4.0)
MCH: 29.3 pg (ref 26.0–34.0)
MCHC: 33 g/dL (ref 30.0–36.0)
MCV: 89 fL (ref 80.0–100.0)
Monocytes Absolute: 0.4 K/uL (ref 0.1–1.0)
Monocytes Relative: 7 %
Neutro Abs: 3.9 K/uL (ref 1.7–7.7)
Neutrophils Relative %: 72 %
Platelet Count: 190 K/uL (ref 150–400)
RBC: 3.92 MIL/uL (ref 3.87–5.11)
RDW: 14.7 % (ref 11.5–15.5)
WBC Count: 5.4 K/uL (ref 4.0–10.5)
nRBC: 0 % (ref 0.0–0.2)

## 2023-10-29 LAB — LACTATE DEHYDROGENASE: LDH: 169 U/L (ref 98–192)

## 2023-10-29 MED ORDER — AZITHROMYCIN 250 MG PO TABS: ORAL_TABLET | ORAL | 0 refills | Status: DC

## 2023-10-29 NOTE — Progress Notes (Signed)
 / [facility] [phone]) [phone]   [phone]) [phone]  PROGRESS NOTE  Patient Care Team: Perri Ronal PARAS, MD as PCP - General (Internal Medicine) Court Dorn PARAS, MD as PCP - Cardiology (Cardiology)  Hematological/Oncological History # Multiple Myeloma with Plasmacytoma 03/03/2022: established with the [facility] in Oncology  04/15/2022: CT biopsy and bone marrow biopsy confirmed the presence of a plasmacytoma and bone marrow involvement of multiple myeloma.  05/05/2022: Cycle 1 Day 1 of VRD therapy 05/27/2022: Cycle 2 Day 1 of VRD therapy 06/17/2022: Cycle 3 Day 1 of VRD therapy 07/08/2022: Cycle 4 Day 1 of VRD therapy 07/29/2022: Cycle 5 Day 1 of VRD therapy 08/19/2022: Cycle 6 Day 1 of VRD therapy 09/09/2022: Cycle 7 Day 1 of VRD therapy 09/30/2022: Cycle 8 Day 1 of VRD therapy 10/23/2022: Started maintenance Revlimid  therapy  Interval History:  Sonya Franklin 58 y.o. female with medical history significant for recently diagnosed multiple myeloma with plasmacytoma who presents for a follow up visit. The patient's last visit was on 08/06/2023. She presents today for a follow up for maintenance Revlimid  therapy.   On exam today [name] reports she has been well overall in the interim since our last visit.  She reports that she does have a little bit of congestion and runny nose but did mow the grass yesterday.  She reports she also has a crick in her neck and thinks is because she slept on it funny.  She reports that she is not having any sore throat or cough or any other infectious symptoms such as fevers, chills, sweats.  She is tolerating her Revlimid  well with no nausea, vomiting, or diarrhea.  She reports her appetite is good.  She reports that she is not having any overt signs of bruising, bleeding, or dark stools.  Overall she has been well and has no questions concerns or complaints today.  She is willing and able to continue on Revlimid  therapy at this time.   Will  MEDICAL HISTORY:  Past Medical History:  Diagnosis Date   Ankylosing spondylitis (HCC)    Anxiety    Arthritis    Breast cancer (HCC)    right breast   Bursitis of hip, right    Cardiac arrhythmia    Carotid stenosis 08/26/2021   GE reflux    Hiatal hernia    Hypertension    Hyperthyroidism    Ischemic bowel disease (HCC)    Multiple myeloma not having achieved remission (HCC)    Prediabetes 08/26/2021   Snoring 03/21/2014   Vitamin D  deficiency     SURGICAL HISTORY: Past Surgical History:  Procedure Laterality Date   ABDOMINAL HYSTERECTOMY     BREAST LUMPECTOMY     right   MASTECTOMY     double    SOCIAL HISTORY: Social History   Socioeconomic History   Marital status: Divorced    Spouse name: Not on file   Number of children: 1   Years of education: Not on file   Highest education level: Not on file  Occupational History    Employer: UNEMPLOYED  Tobacco Use   Smoking status: Never   Smokeless tobacco: Never  Vaping Use   Vaping status: Never Used  Substance and Sexual Activity   Alcohol use: No   Drug use: No   Sexual activity: Yes  Other Topics Concern   Not on file  Social History Narrative   Caffeine occasional drinker.   Not working/disabled.  12th grader. Divorced, one kids.  Receives disability benefits due to hx of breast cancer 2007 and ankylosing spondylitis. Divorced. 1 special needs son who she resides. Non-smoker and no alcohol consumption. Occasionally drinks caffeine.    Right handed   Caffeine occas   One level stays on this level      Fhx:   Maternal Grandmother w/ hx of Goiter   Mother w/ hx of Breast Cancer, diagnosed at 44, Diabetes, Hyperlipidemia, Hypertension, and Thyroid  Disease.    Father w/ hx of Prostate Cancer, High Cholesterol, and High Blood Pressure     Maternal Aunt w/ hx of Breast Cancer.    3 Brothers - 1 w/ hx of Colitis.   Social Drivers of Corporate investment banker Strain: Low Risk  (10/13/2023)    Overall Financial Resource Strain (CARDIA)    Difficulty of Paying Living Expenses: Not hard at all  Food Insecurity: No Food Insecurity (10/13/2023)   Hunger Vital Sign    Worried About Running Out of Food in the Last Year: Never true    Ran Out of Food in the Last Year: Never true  Transportation Needs: No Transportation Needs (10/13/2023)   PRAPARE - Administrator, Civil Service (Medical): No    Lack of Transportation (Non-Medical): No  Physical Activity: Inactive (10/13/2023)   Exercise Vital Sign    Days of Exercise per Week: 0 days    Minutes of Exercise per Session: 0 min  Stress: No Stress Concern Present (10/13/2023)   [employer] of Occupational Health - Occupational Stress Questionnaire    Feeling of Stress: Not at all  Social Connections: Socially Isolated (10/13/2023)   Social Connection and Isolation Panel    Frequency of Communication with Friends and Family: More than three times a week    Frequency of Social Gatherings with Friends and Family: More than three times a week    Attends Religious Services: Never    Database administrator or Organizations: No    Attends Banker Meetings: Never    Marital Status: Divorced  Catering manager Violence: Not At Risk (10/13/2023)   Humiliation, Afraid, Rape, and Kick questionnaire    Fear of Current or Ex-Partner: No    Emotionally Abused: No    Physically Abused: No    Sexually Abused: No    FAMILY HISTORY: Family History  Problem Relation Age of Onset   Thyroid  disease Mother    Diabetes Mother    Hypertension Mother    Hyperlipidemia Mother    High blood pressure Father    High Cholesterol Father    Colitis Brother    Goiter Maternal Grandmother    Breast cancer Maternal Aunt     ALLERGIES:  is allergic to shellfish allergy, blue dyes (parenteral), and hydrochlorothiazide .  MEDICATIONS:  Current Outpatient Medications  Medication Sig Dispense Refill   acetaminophen  (TYLENOL ) 500 MG  tablet Take 500 mg by mouth every 6 (six) hours as needed for moderate pain.     ALPRAZolam  (XANAX ) 0.5 MG tablet Take 1 tablet (0.5 mg total) by mouth 3 (three) times daily as needed for anxiety. 60 tablet 2   amLODipine  (NORVASC ) 5 MG tablet TAKE 1 TABLET (5 MG TOTAL) BY MOUTH DAILY. 90 tablet 2   aspirin EC 81 MG tablet Take 81 mg by mouth daily. Swallow whole.     azithromycin  (ZITHROMAX  Z-PAK) 250 MG tablet Take 2 pill on Day 1, take 1 pill on Day 2-5. 6 each 0   dicyclomine  (BENTYL ) 20  MG tablet One tab before meals and at bedtime as needed for diarrhea with irritable bowel syndrome 60 tablet 1   furosemide  (LASIX ) 20 MG tablet TAKE 1 TABLET BY MOUTH EVERY DAY AS NEEDED 90 tablet 0   hydrochlorothiazide  (HYDRODIURIL ) 25 MG tablet TAKE 1 TABLET (25 MG TOTAL) BY MOUTH DAILY. 90 tablet 3   KLOR-CON  M20 20 MEQ tablet TAKE 2 TABLETS BY MOUTH EVERY DAY 180 tablet 1   lenalidomide  (REVLIMID ) 10 MG capsule Take 1 capsule (10 mg total) by mouth daily. Auth # [id number] Date Obtained 10/04/23  Take 1 capsule daily for 21 days then none for 7 days 21 capsule 0   lidocaine  (LIDODERM ) 5 % Place 1 patch onto the skin daily. Remove & Discard patch within 12 hours or as directed by MD 30 patch 1   metoprolol  tartrate (LOPRESSOR ) 25 MG tablet TAKE 1 TAB BY MOUTH UP TO TWICE DAILY IF NEEDED FOR PALPITATIONS 180 tablet 1   nystatin  (MYCOSTATIN ) 100000 UNIT/ML suspension Take 5 mLs (500,000 Units total) by mouth 4 (four) times daily. 60 mL 0   olopatadine  (PATANOL) 0.1 % ophthalmic solution Place 1 drop into both eyes 2 (two) times daily as needed. 5 mL PRN   pantoprazole  (PROTONIX ) 40 MG tablet TAKE 1 TABLET BY MOUTH EVERY DAY 90 tablet 3   triamcinolone  cream (KENALOG ) 0.1 % Apply 1 Application topically 2 (two) times daily. 30 g 0   No current facility-administered medications for this visit.    REVIEW OF SYSTEMS:   Constitutional: ( - ) fevers, ( - )  chills , ( - ) night sweats Eyes: ( - ) blurriness of  vision, ( - ) double vision, ( - ) watery eyes Ears, nose, mouth, throat, and face: ( - ) mucositis, ( - ) sore throat Respiratory: ( - ) cough, ( - ) dyspnea, ( - ) wheezes Cardiovascular: ( - ) palpitation, ( - ) chest discomfort, ( + ) lower extremity swelling Gastrointestinal:  ( - ) nausea, ( - ) heartburn, ( - ) change in bowel habits Skin: ( - ) abnormal skin rashes Lymphatics: ( - ) new lymphadenopathy, ( - ) easy bruising Neurological: ( - ) numbness, ( - ) tingling, ( - ) new weaknesses Behavioral/Psych: ( - ) mood change, ( - ) new changes  All other systems were reviewed with the patient and are negative.  PHYSICAL EXAMINATION: ECOG PERFORMANCE STATUS: 1 - Symptomatic but completely ambulatory  Vitals:   10/29/23 1455  BP: 119/76  Pulse: 69  Resp: 17  Temp: (!) 97.2 F (36.2 C)   Filed Weights   10/29/23 1455  Weight: 167 lb (75.8 kg)    GENERAL: Well-appearing middle-aged African-American female, alert, no distress and comfortable SKIN: skin color, texture, turgor are normal, no rashes or significant lesions EYES: conjunctiva are pink and non-injected, sclera clear LUNGS: clear to auscultation and percussion with normal breathing effort HEART: regular rate & rhythm and no murmurs. Improving bilateral lower extremity edema. Musculoskeletal: no cyanosis of digits and no clubbing  PSYCH: alert & oriented x 3, fluent speech NEURO: no focal motor/sensory deficits  LABORATORY DATA:  I have reviewed the data as listed    Latest Ref Rng & Units 10/29/2023    2:38 PM 10/01/2023    1:36 PM 09/03/2023    1:26 PM  CBC  WBC 4.0 - 10.5 K/uL 5.4  8.1  7.4   Hemoglobin 12.0 - 15.0 g/dL 88.4  87.9  88.2  Hematocrit 36.0 - 46.0 % 34.9  36.3  35.3   Platelets 150 - 400 K/uL 190  217  222        Latest Ref Rng & Units 10/29/2023    2:38 PM 10/01/2023    1:36 PM 09/03/2023    1:26 PM  CMP  Glucose 70 - 99 mg/dL 94  94  99   BUN 6 - 20 mg/dL 11  10  8    Creatinine 0.44 -  1.00 mg/dL 9.21  9.26  9.32   Sodium 135 - 145 mmol/L 142  143  142   Potassium 3.5 - 5.1 mmol/L 3.6  3.5  3.4   Chloride 98 - 111 mmol/L 106  108  108   CO2 22 - 32 mmol/L 28  29  27    Calcium  8.9 - 10.3 mg/dL 9.0  9.2  9.1   Total Protein 6.5 - 8.1 g/dL 7.0  7.6  7.6   Total Bilirubin 0.0 - 1.2 mg/dL 0.3  0.3  0.5   Alkaline Phos 38 - 126 U/L 79  84  79   AST 15 - 41 U/L 14  12  16    ALT 0 - 44 U/L 18  15  17      Lab Results  Component Value Date   MPROTEIN Not Observed 10/01/2023   MPROTEIN Not Observed 09/03/2023   MPROTEIN Not Observed 08/06/2023   Lab Results  Component Value Date   KPAFRELGTCHN 20.5 (H) 10/01/2023   KPAFRELGTCHN 22.5 (H) 09/03/2023   KPAFRELGTCHN 22.0 (H) 08/06/2023   LAMBDASER 14.7 10/01/2023   LAMBDASER 17.0 09/03/2023   LAMBDASER 15.3 08/06/2023   KAPLAMBRATIO 1.39 10/01/2023   KAPLAMBRATIO 1.32 09/03/2023   KAPLAMBRATIO 1.44 08/06/2023     RADIOGRAPHIC STUDIES: VAS US  CAROTID Result Date: 10/22/2023 Carotid Arterial Duplex Study Patient Name:  Georjean [place] HUBERTY  Date of Exam:   10/22/2023 Medical Rec #: [id number]       Accession #:    [id number] Date of Birth: 03/26/1965        Patient Gender: F Patient Age:   58 years Exam Location:  [address] Procedure:      VAS US  CAROTID Referring Phys: JONATHAN BERRY --------------------------------------------------------------------------------  Indications:       Carotid artery disease and Patient indicates she gets                    intermittent right sided weakness. She has multiple myeloma                    and history of right breast cancer. Risk Factors:      Hypertension, no history of smoking. Comparison Study:  Prior carotid duplex exam on 04/09/2020 showed highest                    velocities in right proximal ICA 162/42 cm/s and left                    proximal ICA 98/26 cm/s. Performing Technologist: Annabella Cater RVT, RDCS (AE), RDMS  Examination Guidelines: A complete evaluation includes B-mode  imaging, spectral Doppler, color Doppler, and power Doppler as needed of all accessible portions of each vessel. Bilateral testing is considered an integral part of a complete examination. Limited examinations for reoccurring indications may be performed as noted.  Right Carotid Findings: +----------+--------+--------+--------+------------------+------------------+           PSV cm/sEDV cm/sStenosisPlaque DescriptionComments           +----------+--------+--------+--------+------------------+------------------+  CCA Prox  100     20                                                   +----------+--------+--------+--------+------------------+------------------+ CCA Mid   102     31                                                   +----------+--------+--------+--------+------------------+------------------+ CCA Distal93      25                                intimal thickening +----------+--------+--------+--------+------------------+------------------+ ICA Prox  76      25              homogeneous                          +----------+--------+--------+--------+------------------+------------------+ ICA Mid   106     32                                dives down         +----------+--------+--------+--------+------------------+------------------+ ICA Distal66      31                                                   +----------+--------+--------+--------+------------------+------------------+ ECA       74      17                                                   +----------+--------+--------+--------+------------------+------------------+ +----------+--------+-------+----------------+---------------------------------+           PSV cm/sEDV cmsDescribe        Arm Pressure (mmHG)               +----------+--------+-------+----------------+---------------------------------+ [id number]            Multiphasic, WNLPt. declined due to right breast                                            ca history                        +----------+--------+-------+----------------+---------------------------------+ +---------+--------+--+--------+--+---------+ VertebralPSV cm/s89EDV cm/s29Antegrade +---------+--------+--+--------+--+---------+  Left Carotid Findings: +----------+--------+--------+--------+------------------+------------------+           PSV cm/sEDV cm/sStenosisPlaque DescriptionComments           +----------+--------+--------+--------+------------------+------------------+ CCA Prox  121     23                                                   +----------+--------+--------+--------+------------------+------------------+  CCA Mid   89      22                                intimal thickening +----------+--------+--------+--------+------------------+------------------+ CCA Distal74      21                                intimal thickening +----------+--------+--------+--------+------------------+------------------+ ICA Prox  62      22                                                   +----------+--------+--------+--------+------------------+------------------+ ICA Mid   64      24                                                   +----------+--------+--------+--------+------------------+------------------+ ICA Distal93      38                                                   +----------+--------+--------+--------+------------------+------------------+ ECA       67      12                                                   +----------+--------+--------+--------+------------------+------------------+ +----------+--------+--------+----------------+-------------------+           PSV cm/sEDV cm/sDescribe        Arm Pressure (mmHG) +----------+--------+--------+----------------+-------------------+ [id number]             Multiphasic, [id number]                  +----------+--------+--------+----------------+-------------------+ +---------+--------+--+--------+--+---------+ VertebralPSV cm/s69EDV cm/s24Antegrade +---------+--------+--+--------+--+---------+   Summary: Right Carotid: The extracranial vessels were near-normal with only minimal wall                thickening or plaque. Left Carotid: The extracranial vessels were near-normal with only minimal wall               thickening or plaque. Vertebrals:  Bilateral vertebral arteries demonstrate antegrade flow. Subclavians: Normal flow hemodynamics were seen in bilateral subclavian              arteries. *See table(s) above for measurements and observations.  Electronically signed by Dorn Ross MD on 10/22/2023 at 3:28:22 PM.    Final    ECHOCARDIOGRAM COMPLETE Result Date: 10/22/2023    ECHOCARDIOGRAM REPORT   Patient Name:   Ailyne ATHLEEN FELTNER  Date of Exam: 10/22/2023 Medical Rec #:  [id number]       Height:       61.0 in Accession #:    [id number]      Weight:       166.0 lb Date of Birth:  02-15-1966        BSA:  1.745 m Patient Age:    58 years        BP:           150/80 mmHg Patient Gender: F               HR:           63 bpm. Exam Location:  [address] Procedure: 2D Echo, 3D Echo, Cardiac Doppler, Color Doppler and Strain Analysis            (Both Spectral and Color Flow Doppler were utilized during            procedure). Indications:    R01.1 Murmur  History:        Patient has prior history of Echocardiogram examinations, most                 recent 04/19/2020. Signs/Symptoms:Murmur; Risk                 Factors:Hypertension and Non-Smoker. Patient denies chest pain,                 SOB, and leg edema. History of right breast cancer with                 bilateral saline implants, and multiple myeloma.  Sonographer:    Annabella Cater RVT, RDCS (AE), RDMS Referring Phys: [id number] JONATHAN J BERRY IMPRESSIONS  1. Left ventricular ejection fraction, by estimation, is 60 to 65%. The left ventricle has  normal function. The left ventricle has no regional wall motion abnormalities. Left ventricular diastolic parameters were normal. The average left ventricular global longitudinal strain is -18.1 %. The global longitudinal strain is normal.  2. Right ventricular systolic function is normal. The right ventricular size is normal.  3. Left atrial size was mildly dilated.  4. The mitral valve is abnormal. Mild mitral valve regurgitation. No evidence of mitral stenosis.  5. Tech measured small LVOT of 1.5 Unable to erase this figue AVA calulated using LVOT 1.8 cm, LVOT VTI 21.5 and AV VTI 54.5 is 1 cm2. Visually the AS is not severe and appears more moderate as well DVI non severe as well 0.40. The aortic valve is tricuspid. There is moderate calcification of the aortic valve. There is moderate thickening of the aortic valve. Aortic valve regurgitation is mild. Moderate aortic valve stenosis.  6. The inferior vena cava is normal in size with greater than 50% respiratory variability, suggesting right atrial pressure of 3 mmHg. Comparison(s): EF 60%. FINDINGS  Left Ventricle: Left ventricular ejection fraction, by estimation, is 60 to 65%. The left ventricle has normal function. The left ventricle has no regional wall motion abnormalities. The average left ventricular global longitudinal strain is -18.1 %. Strain was performed and the global longitudinal strain is normal. The left ventricular internal cavity size was normal in size. There is no left ventricular hypertrophy. Left ventricular diastolic parameters were normal. Right Ventricle: The right ventricular size is normal. No increase in right ventricular wall thickness. Right ventricular systolic function is normal. Left Atrium: Left atrial size was mildly dilated. Right Atrium: Right atrial size was normal in size. Pericardium: There is no evidence of pericardial effusion. Mitral Valve: The mitral valve is abnormal. There is moderate thickening of the mitral valve  leaflet(s). There is mild calcification of the mitral valve leaflet(s). Mild mitral valve regurgitation. No evidence of mitral valve stenosis. Tricuspid Valve: The tricuspid valve is normal in structure. Tricuspid valve regurgitation is mild . No  evidence of tricuspid stenosis. Aortic Valve: Tech measured small LVOT of 1.5 Unable to erase this figue AVA calulated using LVOT 1.8 cm, LVOT VTI 21.5 and AV VTI 54.5 is 1 cm2. Visually the AS is not severe and appears more moderate as well DVI non severe as well 0.40. The aortic valve is tricuspid. There is moderate calcification of the aortic valve. There is moderate thickening of the aortic valve. Aortic valve regurgitation is mild. Moderate aortic stenosis is present. Aortic valve mean gradient measures 12.0 mmHg. Aortic valve peak gradient measures 23.7 mmHg. Aortic valve area, by VTI measures 0.87 cm. Pulmonic Valve: The pulmonic valve was normal in structure. Pulmonic valve regurgitation is not visualized. No evidence of pulmonic stenosis. Aorta: The aortic root is normal in size and structure. Venous: The inferior vena cava is normal in size with greater than 50% respiratory variability, suggesting right atrial pressure of 3 mmHg. IAS/Shunts: No atrial level shunt detected by color flow Doppler. Additional Comments: 3D was performed not requiring image post processing on an independent workstation and was normal.  LEFT VENTRICLE PLAX 2D LVIDd:         4.69 cm   Diastology LVIDs:         3.48 cm   LV e' medial:    5.29 cm/s LV PW:         0.84 cm   LV E/e' medial:  17.5 LV IVS:        0.95 cm   LV e' lateral:   7.70 cm/s LVOT diam:     1.65 cm   LV E/e' lateral: 12.1 LV SV:         46 LV SV Index:   26        2D Longitudinal Strain LVOT Area:     2.15 cm  2D Strain GLS Avg:     -18.1 %                           3D Volume EF:                          3D EF:        67 %                          LV EDV:       120 ml                          LV ESV:       40 ml                           LV SV:        80 ml RIGHT VENTRICLE RV S prime:     14.80 cm/s TAPSE (M-mode): 2.5 cm LEFT ATRIUM           Index        RIGHT ATRIUM           Index LA diam:      4.08 cm 2.34 cm/m   RA Area:     15.15 cm LA Vol (A2C): 42.0 ml 24.07 ml/m  RA Volume:   36.95 ml  21.18 ml/m  AORTIC VALVE  PULMONIC VALVE AV Area (Vmax):    0.86 cm      PV Vmax:       1.08 m/s AV Area (Vmean):   0.90 cm      PV Peak grad:  4.6 mmHg AV Area (VTI):     0.87 cm AV Vmax:           243.50 cm/s AV Vmean:          160.500 cm/s AV VTI:            0.531 m AV Peak Grad:      23.7 mmHg AV Mean Grad:      12.0 mmHg LVOT Vmax:         97.40 cm/s LVOT Vmean:        66.900 cm/s LVOT VTI:          0.215 m LVOT/AV VTI ratio: 0.40  AORTA Ao Root diam: 2.37 cm Ao Asc diam:  3.05 cm Ao Arch diam: 2.8 cm MITRAL VALVE                TRICUSPID VALVE MV Area (PHT): 5.54 cm     TR Peak grad:   21.3 mmHg MV Decel Time: 137 msec     TR Vmax:        231.00 cm/s MV E velocity: 92.80 cm/s MV A velocity: 119.00 cm/s  SHUNTS MV E/A ratio:  0.78         Systemic VTI:  0.22 m                             Systemic Diam: 1.65 cm Maude Emmer MD Electronically signed by Maude Emmer MD Signature Date/Time: 10/22/2023/2:21:29 PM    Final     ASSESSMENT & PLAN Sonya Franklin is a 58 y.o. female with medical history significant for newly diagnosed multiple myeloma with plasmacytoma who presents for a follow up visit.  # Multiple Myeloma with Plasmactyoma --Confirmed with L5 bone lesion biopsy and bone marrow biopsy. --Started VRD therapy on 05/05/2022.  --Patient completed her consultation with [facility] BMT on 08/13/2022.  She notes that she would prefer to pursue maintenance therapy over bone marrow transplant. --After 8 cycles, transitioned to maintenance Revlimid  10 mg PO daily 21 days on, 7 days off on 10/23/2022.  PLAN: --Currently on maintenance therapy with Revlimid  10 mg p.o. daily 21 days on and 7 days  off. --Labs today showed white blood cell 5.4, Hgb 11.5, MCV 89, Plt 190.  Creatinine and calcium  levels are in range --Last myeloma labs from 10/01/2023 showed M protein was undetectablle. Kappa light chain stable at 23.1.  --Continue with Revlimid  therapy without any dose modifications.  --RTC in 4 weeks for labs and 12 weeks for clinic visit   #B/L lower extremity edema: --Doppler US  from 11/03/2022 ruled out DVT --Edema has improved after undergoing RAI I-131 therapy for thyroid  disease on 12/28/2022.  --Continue on lasix  therapy managed by PCP  #Normocytic anemia--improved: --Etiologies include Revlimid  therapy or iron deficiency  --received 1 dose of Feraheme on 11/24/2022.   # Right lower extremity neuropathy: --Greatly improved --Monitor for now.   # Cancer related pain-improved --Secondary to focal tumor replacement of the right-side of the L5 vertebral body extending into the right L5 pedicle and posterior elements with a small extraosseous component extending into the right L5 neural foramen with moderate-severe stenosi -- Unable to tolerate opoid medications -- Under  the care of pallative care. Medication includes lidocaine  pain patch and tylenol  --Patient is not interested in palliative radiation at this time.   #Supportive Care -- chemotherapy education completed -- port placement not required.  -- Pain medication as noted above  No orders of the defined types were placed in this encounter.  All questions were answered. The patient knows to call the clinic with any problems, questions or concerns.  I have spent a total of 30 minutes minutes of face-to-face and non-face-to-face time, preparing to see the patient, performing a medically appropriate examination, counseling and educating the patient, documenting clinical information in the electronic health record, independently interpreting results and communicating results to the patient, and care coordination.   Norleen IVAR Kidney, MD Department of Hematology/Oncology [facility] at [facility] Phone: [phone] Pager: [phone] Email: [email or web address] 1.  Hematologist 10/31/2023 6:37 PM

## 2023-10-31 ENCOUNTER — Encounter: Payer: Self-pay | Admitting: Physician Assistant

## 2023-10-31 ENCOUNTER — Encounter: Payer: Self-pay | Admitting: Hematology and Oncology

## 2023-11-01 LAB — KAPPA/LAMBDA LIGHT CHAINS
Kappa free light chain: 19.9 mg/L — ABNORMAL HIGH (ref 3.3–19.4)
Kappa, lambda light chain ratio: 1.35 (ref 0.26–1.65)
Lambda free light chains: 14.7 mg/L (ref 5.7–26.3)

## 2023-11-02 LAB — MULTIPLE MYELOMA PANEL, SERUM
Albumin SerPl Elph-Mcnc: 3.5 g/dL (ref 2.9–4.4)
Albumin/Glob SerPl: 1.1 (ref 0.7–1.7)
Alpha 1: 0.3 g/dL (ref 0.0–0.4)
Alpha2 Glob SerPl Elph-Mcnc: 0.7 g/dL (ref 0.4–1.0)
B-Globulin SerPl Elph-Mcnc: 1.1 g/dL (ref 0.7–1.3)
Gamma Glob SerPl Elph-Mcnc: 1.2 g/dL (ref 0.4–1.8)
Globulin, Total: 3.3 g/dL (ref 2.2–3.9)
IgA: 219 mg/dL (ref 87–352)
IgG (Immunoglobin G), Serum: 1240 mg/dL (ref 586–1602)
IgM (Immunoglobulin M), Srm: 30 mg/dL (ref 26–217)
Total Protein ELP: 6.8 g/dL (ref 6.0–8.5)

## 2023-11-10 ENCOUNTER — Other Ambulatory Visit: Payer: Self-pay | Admitting: *Deleted

## 2023-11-10 DIAGNOSIS — C9000 Multiple myeloma not having achieved remission: Secondary | ICD-10-CM

## 2023-11-10 MED ORDER — LENALIDOMIDE 10 MG PO CAPS: 10.0000 mg | ORAL_CAPSULE | Freq: Every day | ORAL | 0 refills | Status: DC

## 2023-11-16 ENCOUNTER — Encounter: Payer: Self-pay | Admitting: Plastic Surgery

## 2023-11-16 ENCOUNTER — Ambulatory Visit: Admitting: Plastic Surgery

## 2023-11-16 VITALS — BP 156/77 | HR 73 | Ht 61.0 in | Wt 166.6 lb

## 2023-11-16 DIAGNOSIS — Z853 Personal history of malignant neoplasm of breast: Secondary | ICD-10-CM | POA: Diagnosis not present

## 2023-11-16 DIAGNOSIS — Z9013 Acquired absence of bilateral breasts and nipples: Secondary | ICD-10-CM | POA: Insufficient documentation

## 2023-11-16 DIAGNOSIS — N6459 Other signs and symptoms in breast: Secondary | ICD-10-CM

## 2023-11-16 DIAGNOSIS — M457 Ankylosing spondylitis of lumbosacral region: Secondary | ICD-10-CM

## 2023-11-16 DIAGNOSIS — I6523 Occlusion and stenosis of bilateral carotid arteries: Secondary | ICD-10-CM

## 2023-11-16 DIAGNOSIS — C9000 Multiple myeloma not having achieved remission: Secondary | ICD-10-CM

## 2023-11-16 DIAGNOSIS — E0590 Thyrotoxicosis, unspecified without thyrotoxic crisis or storm: Secondary | ICD-10-CM

## 2023-11-16 NOTE — Progress Notes (Signed)
 Patient ID: Sonya Franklin, female    DOB: 1965/06/02, 58 y.o.   MRN: [id number]   Chief Complaint  Patient presents with   consult    The patient is a 58 year old female here for evaluation of her breast.  She is 5 feet 1 inch tall and weighs 166 pounds.  Approximately 18 years ago the patient underwent mastectomies with reconstruction.  She has saline implants in place from Dr. Mercer.  She is not sure about the size.  She noticed that about a year ago her left implant was deflating and now is completely flat.  She is interested in exchange and reconstruction.  She understands she may need to be slightly smaller because the left side is probably tightened up a bit.  But she would like to be around about the same size.  The patient does not know what size implants she has in and I cannot find it in epic either.    Review of Systems  Constitutional: Negative.   HENT: Negative.    Eyes: Negative.   Respiratory: Negative.    Cardiovascular: Negative.   Gastrointestinal: Negative.   Endocrine: Negative.   Genitourinary: Negative.   Musculoskeletal: Negative.     Past Medical History:  Diagnosis Date   Ankylosing spondylitis (HCC)    Anxiety    Arthritis    Breast cancer (HCC)    right breast   Bursitis of hip, right    Cardiac arrhythmia    Carotid stenosis 08/26/2021   GE reflux    Hiatal hernia    Hypertension    Hyperthyroidism    Ischemic bowel disease (HCC)    Multiple myeloma not having achieved remission (HCC)    Prediabetes 08/26/2021   Snoring 03/21/2014   Vitamin D  deficiency     Past Surgical History:  Procedure Laterality Date   ABDOMINAL HYSTERECTOMY     BREAST LUMPECTOMY     right   MASTECTOMY     double      Current Outpatient Medications:    acetaminophen  (TYLENOL ) 500 MG tablet, Take 500 mg by mouth every 6 (six) hours as needed for moderate pain. (Patient taking differently: Take 500 mg by mouth 3 (three) times daily.), Disp: , Rfl:     ALPRAZolam  (XANAX ) 0.5 MG tablet, Take 1 tablet (0.5 mg total) by mouth 3 (three) times daily as needed for anxiety., Disp: 60 tablet, Rfl: 2   amLODipine  (NORVASC ) 5 MG tablet, TAKE 1 TABLET (5 MG TOTAL) BY MOUTH DAILY., Disp: 90 tablet, Rfl: 2   aspirin EC 81 MG tablet, Take 81 mg by mouth daily. Swallow whole., Disp: , Rfl:    azithromycin  (ZITHROMAX  Z-PAK) 250 MG tablet, Take 2 pill on Day 1, take 1 pill on Day 2-5., Disp: 6 each, Rfl: 0   dicyclomine  (BENTYL ) 20 MG tablet, One tab before meals and at bedtime as needed for diarrhea with irritable bowel syndrome, Disp: 60 tablet, Rfl: 1   furosemide  (LASIX ) 20 MG tablet, TAKE 1 TABLET BY MOUTH EVERY DAY AS NEEDED, Disp: 90 tablet, Rfl: 0   hydrochlorothiazide  (HYDRODIURIL ) 25 MG tablet, TAKE 1 TABLET (25 MG TOTAL) BY MOUTH DAILY., Disp: 90 tablet, Rfl: 3   KLOR-CON  M20 20 MEQ tablet, TAKE 2 TABLETS BY MOUTH EVERY DAY, Disp: 180 tablet, Rfl: 1   lenalidomide  (REVLIMID ) 10 MG capsule, Take 1 capsule (10 mg total) by mouth daily. Auth #  [id number] Date Obtained 11/10/23  Take 1 capsule daily for 21 days  then none for 7 days, Disp: 21 capsule, Rfl: 0   lidocaine  (LIDODERM ) 5 %, Place 1 patch onto the skin daily. Remove & Discard patch within 12 hours or as directed by MD, Disp: 30 patch, Rfl: 1   metoprolol  tartrate (LOPRESSOR ) 25 MG tablet, TAKE 1 TAB BY MOUTH UP TO TWICE DAILY IF NEEDED FOR PALPITATIONS, Disp: 180 tablet, Rfl: 1   nystatin  (MYCOSTATIN ) 100000 UNIT/ML suspension, Take 5 mLs (500,000 Units total) by mouth 4 (four) times daily., Disp: 60 mL, Rfl: 0   olopatadine  (PATANOL) 0.1 % ophthalmic solution, Place 1 drop into both eyes 2 (two) times daily as needed., Disp: 5 mL, Rfl: PRN   pantoprazole  (PROTONIX ) 40 MG tablet, TAKE 1 TABLET BY MOUTH EVERY DAY, Disp: 90 tablet, Rfl: 3   triamcinolone  cream (KENALOG ) 0.1 %, Apply 1 Application topically 2 (two) times daily., Disp: 30 g, Rfl: 0   Objective:   Vitals:   11/16/23 1019  BP: (!)  156/77  Pulse: 73  SpO2: 97%    Physical Exam Vitals reviewed.  Constitutional:      Appearance: Normal appearance.  HENT:     Head: Atraumatic.  Cardiovascular:     Rate and Rhythm: Normal rate.     Pulses: Normal pulses.  Pulmonary:     Effort: Pulmonary effort is normal.  Abdominal:     Palpations: Abdomen is soft.  Skin:    General: Skin is warm.     Capillary Refill: Capillary refill takes less than 2 seconds.  Neurological:     Mental Status: She is alert and oriented to person, place, and time.  Psychiatric:        Mood and Affect: Mood normal.        Behavior: Behavior normal.        Thought Content: Thought content normal.        Judgment: Judgment normal.     Assessment & Plan:  Ankylosing spondylitis of lumbosacral region [facility])  Thyrotoxicosis w/o crisis  Bilateral carotid artery stenosis  Multiple myeloma not having achieved remission (HCC)  Acquired absence of breast and absent nipple, bilateral  History of breast cancer  In for removal of both breasts implants and replacement with saline implants.  The patient is aware that she does have a high chance of complications due to her overall medical condition.  She will need to come off any immunosuppressive drugs if she is on any.  I do not see any on her chart but we did talk about this.  Plan will be for bilateral removal of implants and replacement with saline implants.  Pictures were obtained of the patient and placed in the chart with the patient's or guardian's permission.   Sonya RAMAN Chanae Gemma, DO

## 2023-11-26 ENCOUNTER — Inpatient Hospital Stay: Attending: Physician Assistant

## 2023-11-26 DIAGNOSIS — C9000 Multiple myeloma not having achieved remission: Secondary | ICD-10-CM | POA: Insufficient documentation

## 2023-11-26 DIAGNOSIS — R002 Palpitations: Secondary | ICD-10-CM | POA: Diagnosis not present

## 2023-11-26 LAB — CMP (CANCER CENTER ONLY)
ALT: 14 U/L (ref 0–44)
AST: 12 U/L — ABNORMAL LOW (ref 15–41)
Albumin: 4.4 g/dL (ref 3.5–5.0)
Alkaline Phosphatase: 72 U/L (ref 38–126)
Anion gap: 5 (ref 5–15)
BUN: 12 mg/dL (ref 6–20)
CO2: 27 mmol/L (ref 22–32)
Calcium: 9 mg/dL (ref 8.9–10.3)
Chloride: 108 mmol/L (ref 98–111)
Creatinine: 0.71 mg/dL (ref 0.44–1.00)
GFR, Estimated: >60 mL/min (ref >60)
Glucose, Bld: 90 mg/dL (ref 70–99)
Potassium: 3.8 mmol/L (ref 3.5–5.1)
Sodium: 140 mmol/L (ref 135–145)
Total Bilirubin: 0.4 mg/dL (ref 0.0–1.2)
Total Protein: 7.6 g/dL (ref 6.5–8.1)

## 2023-11-26 LAB — CBC WITH DIFFERENTIAL (CANCER CENTER ONLY)
Abs Immature Granulocytes: 0.02 K/uL (ref 0.00–0.07)
Basophils Absolute: 0.1 K/uL (ref 0.0–0.1)
Basophils Relative: 3 %
Eosinophils Absolute: 0.1 K/uL (ref 0.0–0.5)
Eosinophils Relative: 1 %
HCT: 36.9 % (ref 36.0–46.0)
Hemoglobin: 12.2 g/dL (ref 12.0–15.0)
Immature Granulocytes: 0 %
Lymphocytes Relative: 21 %
Lymphs Abs: 1.1 K/uL (ref 0.7–4.0)
MCH: 29.3 pg (ref 26.0–34.0)
MCHC: 33.1 g/dL (ref 30.0–36.0)
MCV: 88.7 fL (ref 80.0–100.0)
Monocytes Absolute: 0.8 K/uL (ref 0.1–1.0)
Monocytes Relative: 15 %
Neutro Abs: 3.2 K/uL (ref 1.7–7.7)
Neutrophils Relative %: 60 %
Platelet Count: 212 K/uL (ref 150–400)
RBC: 4.16 MIL/uL (ref 3.87–5.11)
RDW: 14.4 % (ref 11.5–15.5)
WBC Count: 5.3 K/uL (ref 4.0–10.5)
nRBC: 0 % (ref 0.0–0.2)

## 2023-11-26 LAB — LACTATE DEHYDROGENASE: LDH: 179 U/L (ref 98–192)

## 2023-11-29 LAB — KAPPA/LAMBDA LIGHT CHAINS
Kappa free light chain: 16.6 mg/L (ref 3.3–19.4)
Kappa, lambda light chain ratio: 1.28 (ref 0.26–1.65)
Lambda free light chains: 13 mg/L (ref 5.7–26.3)

## 2023-11-30 LAB — MULTIPLE MYELOMA PANEL, SERUM
Albumin SerPl Elph-Mcnc: 3.9 g/dL (ref 2.9–4.4)
Albumin/Glob SerPl: 1.2 (ref 0.7–1.7)
Alpha 1: 0.2 g/dL (ref 0.0–0.4)
Alpha2 Glob SerPl Elph-Mcnc: 0.8 g/dL (ref 0.4–1.0)
B-Globulin SerPl Elph-Mcnc: 1.2 g/dL (ref 0.7–1.3)
Gamma Glob SerPl Elph-Mcnc: 1.2 g/dL (ref 0.4–1.8)
Globulin, Total: 3.4 g/dL (ref 2.2–3.9)
IgA: 248 mg/dL (ref 87–352)
IgG (Immunoglobin G), Serum: 1275 mg/dL (ref 586–1602)
IgM (Immunoglobulin M), Srm: 28 mg/dL (ref 26–217)
Total Protein ELP: 7.3 g/dL (ref 6.0–8.5)

## 2023-12-02 ENCOUNTER — Other Ambulatory Visit: Payer: Self-pay | Admitting: Internal Medicine

## 2023-12-06 DIAGNOSIS — R002 Palpitations: Secondary | ICD-10-CM | POA: Diagnosis not present

## 2023-12-10 ENCOUNTER — Other Ambulatory Visit: Payer: Self-pay | Admitting: *Deleted

## 2023-12-10 DIAGNOSIS — C9000 Multiple myeloma not having achieved remission: Secondary | ICD-10-CM

## 2023-12-10 MED ORDER — LENALIDOMIDE 10 MG PO CAPS: 10.0000 mg | ORAL_CAPSULE | Freq: Every day | ORAL | 0 refills | Status: DC

## 2023-12-12 NOTE — Progress Notes (Unsigned)
 Cardiology Office Note    Date:  12/14/2023  ID:  AUJANAE MCCULLUM, DOB December 08, 1965, MRN [id number] PCP:  Perri Ronal PARAS, MD  Cardiologist:  Dorn Lesches, MD  Electrophysiologist:  None   Chief Complaint: Follow up for palpitations  History of Present Illness: Sonya    DURU Franklin is a 58 y.o. female with visit-pertinent history of multiple myeloma on revlimid , hypertension, hypothyroidism, ankylosing spondylitis, arthritis and ischemic bowel.  Patient without history of stroke or CAD.  Patient was seen in clinic by Dr. Lesches on 09/13/2023 for evaluation of palpitations.  It is noted that Revlimid  increased risk of palpitations and SVT by 5%.  Patient's PCP had started her on a beta-blocker which improved her symptoms.  Patient denied any chest pain or increased shortness of breath.  2-week ZIO monitor was ordered for further evaluation as well as carotid Dopplers given history of carotid bruits.  Echocardiogram on 10/22/2023 indicated LVEF 60 to 65%, no RWMA, diastolic parameters were normal, RV systolic function and size was normal, LA size was mildly dilated, mild mitral valve regurgitation with no evidence of stenosis patient with moderate aortic valve stenosis, regurgitation was mild.  14-day ZIO monitor indicated an average heart rate of 70 bpm ranging from 41 to 150 bpm.  Predominant underlying rhythm was sinus rhythm, there was 1 run of supraventricular tachycardia lasting 8 beats with a max rate of 121 bpm patient with occasional PACs and PVCs.  Today she presents for follow-up.  She reports that she has been using metoprolol  tartrate 25 mg twice daily as needed nearly every day as a result of increased palpitations.  She reports that when she takes the medications that her palpitations will resolve.  She denies any significant chest pain, shortness of breath, orthopnea or PND.  She does note occasional increased lower extremity edema that is resolved with as needed Lasix .  Patient reports that  she overall simply does not feel well when taking Revlimid , which she takes 21 days in a row then will have a 7 day off period, notes she feels significantly improved during the off period.  She notes that she has problems with occasional dizziness and lightheadedness while taking the medication, improves when off of the medication. ROS: .   Today she denies chest pain, shortness of breath, lower extremity edema, fatigue, palpitations, melena, hematuria, hemoptysis, diaphoresis, weakness, syncope, orthopnea, and PND.  All other systems are reviewed and otherwise negative. Studies Reviewed: Sonya   EKG:  EKG is not ordered today.  CV Studies: Cardiac studies reviewed are outlined and summarized above. Otherwise please see EMR for full report. Cardiac Studies & Procedures   ______________________________________________________________________________________________     ECHOCARDIOGRAM  ECHOCARDIOGRAM COMPLETE 10/22/2023  Narrative ECHOCARDIOGRAM REPORT    Patient Name:   Sonya Franklin  Date of Exam: 10/22/2023 Medical Rec #:  [id number]       Height:       61.0 in Accession #:    [id number]      Weight:       166.0 lb Date of Birth:  1965-12-24        BSA:          1.745 m Patient Age:    58 years        BP:           150/80 mmHg Patient Gender: F               HR:  63 bpm. Exam Location:  [address]  Procedure: 2D Echo, 3D Echo, Cardiac Doppler, Color Doppler and Strain Analysis (Both Spectral and Color Flow Doppler were utilized during procedure).  Indications:    R01.1 Murmur  History:        Patient has prior history of Echocardiogram examinations, most recent 04/19/2020. Signs/Symptoms:Murmur; Risk Factors:Hypertension and Non-Smoker. Patient denies chest pain, SOB, and leg edema. History of right breast cancer with bilateral saline implants, and multiple myeloma.  Sonographer:    Annabella Cater RVT, RDCS (AE), RDMS Referring Phys: [id number] JONATHAN J  BERRY  IMPRESSIONS   1. Left ventricular ejection fraction, by estimation, is 60 to 65%. The left ventricle has normal function. The left ventricle has no regional wall motion abnormalities. Left ventricular diastolic parameters were normal. The average left ventricular global longitudinal strain is -18.1 %. The global longitudinal strain is normal. 2. Right ventricular systolic function is normal. The right ventricular size is normal. 3. Left atrial size was mildly dilated. 4. The mitral valve is abnormal. Mild mitral valve regurgitation. No evidence of mitral stenosis. 5. Tech measured small LVOT of 1.5 Unable to erase this figue AVA calulated using LVOT 1.8 cm, LVOT VTI 21.5 and AV VTI 54.5 is 1 cm2. Visually the AS is not severe and appears more moderate as well DVI non severe as well 0.40. The aortic valve is tricuspid. There is moderate calcification of the aortic valve. There is moderate thickening of the aortic valve. Aortic valve regurgitation is mild. Moderate aortic valve stenosis. 6. The inferior vena cava is normal in size with greater than 50% respiratory variability, suggesting right atrial pressure of 3 mmHg.  Comparison(s): EF 60%.  FINDINGS Left Ventricle: Left ventricular ejection fraction, by estimation, is 60 to 65%. The left ventricle has normal function. The left ventricle has no regional wall motion abnormalities. The average left ventricular global longitudinal strain is -18.1 %. Strain was performed and the global longitudinal strain is normal. The left ventricular internal cavity size was normal in size. There is no left ventricular hypertrophy. Left ventricular diastolic parameters were normal.  Right Ventricle: The right ventricular size is normal. No increase in right ventricular wall thickness. Right ventricular systolic function is normal.  Left Atrium: Left atrial size was mildly dilated.  Right Atrium: Right atrial size was normal in size.  Pericardium:  There is no evidence of pericardial effusion.  Mitral Valve: The mitral valve is abnormal. There is moderate thickening of the mitral valve leaflet(s). There is mild calcification of the mitral valve leaflet(s). Mild mitral valve regurgitation. No evidence of mitral valve stenosis.  Tricuspid Valve: The tricuspid valve is normal in structure. Tricuspid valve regurgitation is mild . No evidence of tricuspid stenosis.  Aortic Valve: Tech measured small LVOT of 1.5 Unable to erase this figue AVA calulated using LVOT 1.8 cm, LVOT VTI 21.5 and AV VTI 54.5 is 1 cm2. Visually the AS is not severe and appears more moderate as well DVI non severe as well 0.40. The aortic valve is tricuspid. There is moderate calcification of the aortic valve. There is moderate thickening of the aortic valve. Aortic valve regurgitation is mild. Moderate aortic stenosis is present. Aortic valve mean gradient measures 12.0 mmHg. Aortic valve peak gradient measures 23.7 mmHg. Aortic valve area, by VTI measures 0.87 cm.  Pulmonic Valve: The pulmonic valve was normal in structure. Pulmonic valve regurgitation is not visualized. No evidence of pulmonic stenosis.  Aorta: The aortic root is normal in size and structure.  Venous: The inferior vena cava is normal in size with greater than 50% respiratory variability, suggesting right atrial pressure of 3 mmHg.  IAS/Shunts: No atrial level shunt detected by color flow Doppler.  Additional Comments: 3D was performed not requiring image post processing on an independent workstation and was normal.   LEFT VENTRICLE PLAX 2D LVIDd:         4.69 cm   Diastology LVIDs:         3.48 cm   LV e' medial:    5.29 cm/s LV PW:         0.84 cm   LV E/e' medial:  17.5 LV IVS:        0.95 cm   LV e' lateral:   7.70 cm/s LVOT diam:     1.65 cm   LV E/e' lateral: 12.1 LV SV:         46 LV SV Index:   26        2D Longitudinal Strain LVOT Area:     2.15 cm  2D Strain GLS Avg:     -18.1  %  3D Volume EF: 3D EF:        67 % LV EDV:       120 ml LV ESV:       40 ml LV SV:        80 ml  RIGHT VENTRICLE RV S prime:     14.80 cm/s TAPSE (M-mode): 2.5 cm  LEFT ATRIUM           Index        RIGHT ATRIUM           Index LA diam:      4.08 cm 2.34 cm/m   RA Area:     15.15 cm LA Vol (A2C): 42.0 ml 24.07 ml/m  RA Volume:   36.95 ml  21.18 ml/m AORTIC VALVE                     PULMONIC VALVE AV Area (Vmax):    0.86 cm      PV Vmax:       1.08 m/s AV Area (Vmean):   0.90 cm      PV Peak grad:  4.6 mmHg AV Area (VTI):     0.87 cm AV Vmax:           243.50 cm/s AV Vmean:          160.500 cm/s AV VTI:            0.531 m AV Peak Grad:      23.7 mmHg AV Mean Grad:      12.0 mmHg LVOT Vmax:         97.40 cm/s LVOT Vmean:        66.900 cm/s LVOT VTI:          0.215 m LVOT/AV VTI ratio: 0.40  AORTA Ao Root diam: 2.37 cm Ao Asc diam:  3.05 cm Ao Arch diam: 2.8 cm  MITRAL VALVE                TRICUSPID VALVE MV Area (PHT): 5.54 cm     TR Peak grad:   21.3 mmHg MV Decel Time: 137 msec     TR Vmax:        231.00 cm/s MV E velocity: 92.80 cm/s MV A velocity: 119.00 cm/s  SHUNTS MV E/A ratio:  0.78  Systemic VTI:  0.22 m Systemic Diam: 1.65 cm  Maude Emmer MD Electronically signed by Maude Emmer MD Signature Date/Time: 10/22/2023/2:21:29 PM    Final    MONITORS  LONG TERM MONITOR (3-14 DAYS) 11/30/2023  Narrative Patch Wear Time:  13 days and 17 hours (2025-08-03T21:20:18-0400 to 2025-08-17T14:34:24-0400)  Patient had a min HR of 41 bpm, max HR of 150 bpm, and avg HR of 70 bpm. Predominant underlying rhythm was Sinus Rhythm. 1 run of Supraventricular Tachycardia occurred lasting 8 beats with a max rate of 121 bpm (avg 110 bpm). Isolated SVEs were rare (<1.0%), SVE Couplets were rare (<1.0%), and SVE Triplets were rare (<1.0%). Isolated VEs were rare (<1.0%), VE Couplets were rare (<1.0%), and no VE Triplets were present. Ventricular Bigeminy and  Trigeminy were present.  SR/SB/ST Occasional PACs/PVCs Short run of SVT       ______________________________________________________________________________________________       Current Reported Medications:.    Current Meds  Medication Sig   acetaminophen  (TYLENOL ) 500 MG tablet Take 500 mg by mouth every 6 (six) hours as needed for moderate pain. (Patient taking differently: Take 500 mg by mouth 3 (three) times daily.)   ALPRAZolam  (XANAX ) 0.5 MG tablet Take 1 tablet (0.5 mg total) by mouth 3 (three) times daily as needed for anxiety.   amLODipine  (NORVASC ) 5 MG tablet TAKE 1 TABLET (5 MG TOTAL) BY MOUTH DAILY.   aspirin EC 81 MG tablet Take 81 mg by mouth daily. Swallow whole.   furosemide  (LASIX ) 20 MG tablet TAKE 1 TABLET BY MOUTH EVERY DAY AS NEEDED   KLOR-CON  M20 20 MEQ tablet TAKE 2 TABLETS BY MOUTH EVERY DAY   lenalidomide  (REVLIMID ) 10 MG capsule Take 1 capsule (10 mg total) by mouth daily. Auth #  [id number]  Date Obtained 12/10/23  Take 1 capsule daily for 21 days then none for 7 days   olopatadine  (PATANOL) 0.1 % ophthalmic solution Place 1 drop into both eyes 2 (two) times daily as needed.   pantoprazole  (PROTONIX ) 40 MG tablet TAKE 1 TABLET BY MOUTH EVERY DAY   triamcinolone  cream (KENALOG ) 0.1 % Apply 1 Application topically 2 (two) times daily.   [DISCONTINUED] metoprolol  tartrate (LOPRESSOR ) 25 MG tablet TAKE 1 TAB BY MOUTH UP TO TWICE DAILY IF NEEDED FOR PALPITATIONS   Physical Exam:    VS:  BP 136/80 (BP Location: Left Arm, Patient Position: Sitting, Cuff Size: Large)   Pulse 62   Ht 5' 1 (1.549 m)   Wt 167 lb (75.8 kg)   SpO2 98%   BMI 31.55 kg/m    Wt Readings from Last 3 Encounters:  12/14/23 167 lb (75.8 kg)  11/16/23 166 lb 9.6 oz (75.6 kg)  10/29/23 167 lb (75.8 kg)    GEN: Well nourished, well developed in no acute distress NECK: No JVD; No carotid bruits CARDIAC: RRR, 2/6 systolic murmur, no rubs or gallops RESPIRATORY:  Clear to auscultation  without rales, wheezing or rhonchi  ABDOMEN: Soft, non-tender, non-distended EXTREMITIES:  No edema; No acute deformity     Asessement and Plan:.    Palpitations: Patient reported increased palpitations with taking of Revlimid . 14-day ZIO monitor indicated an average heart rate of 70 bpm ranging from 41 to 150 bpm.  Predominant underlying rhythm was sinus rhythm, there was 1 run of supraventricular tachycardia lasting 8 beats with a max rate of 121 bpm patient with occasional PACs and PVCs.  Patient reports that she has been taking metoprolol  to tartrate 25 mg twice  daily as needed every day when she starts having increased palpitations.  She reports that she has palpitations daily that is relieved with the medication.  Discussed with patient taking metoprolol  tartrate 25 mg twice daily instead of only as needed, patient in agreement with this.  She will continue to monitor her symptoms and notify the office if she has persistent palpitations with scheduled dosing of metoprolol .  Start metoprolol  tartrate 25 mg twice daily.  Aortic stenosis: Echo on 10/22/2023 indicated LVEF 60 to 65% with moderate aortic stenosis and mild regurgitation. Today she reports that she is overall doing well denies any significant chest pain or increased shortness of breath.  She notes that she does have intermittent lower extremity edema that is resolved with Lasix .  Encouraged patient to continue monitoring for symptoms, will plan to repeat echo in 1 year.  Hypertension: Blood pressure today 136/80.  Continue amlodipine  5 mg daily, Lasix  20 mg as needed and metoprolol  tartrate 25 mg twice daily.  Carotid stenosis: Patient with history of carotid bruits.  Carotid duplex on 10/22/2023 indicated bilaterally extracranial vessels were near normal with only minimal wall thickening.  Repeat when clinically indicated.   Disposition: F/u with Leiam Hopwood, NP in four months per patient request, she will notify the office is she would  like to be seen sooner.   Signed, Tinya Cadogan D Vimal Derego, NP

## 2023-12-14 ENCOUNTER — Ambulatory Visit: Attending: Cardiology | Admitting: Cardiology

## 2023-12-14 ENCOUNTER — Encounter: Payer: Self-pay | Admitting: Cardiology

## 2023-12-14 VITALS — BP 136/80 | HR 62 | Ht 61.0 in | Wt 167.0 lb

## 2023-12-14 DIAGNOSIS — I10 Essential (primary) hypertension: Secondary | ICD-10-CM | POA: Diagnosis not present

## 2023-12-14 DIAGNOSIS — R002 Palpitations: Secondary | ICD-10-CM

## 2023-12-14 DIAGNOSIS — I6523 Occlusion and stenosis of bilateral carotid arteries: Secondary | ICD-10-CM

## 2023-12-14 DIAGNOSIS — I350 Nonrheumatic aortic (valve) stenosis: Secondary | ICD-10-CM

## 2023-12-14 MED ORDER — METOPROLOL TARTRATE 25 MG PO TABS: 25.0000 mg | ORAL_TABLET | Freq: Two times a day (BID) | ORAL | 3 refills | Status: AC

## 2023-12-14 NOTE — Patient Instructions (Signed)
 Medication Instructions:  Your physician has recommended you make the following change in your medication:   CHANGE the Metoprolol  to every day, twice a day  *If you need a refill on your cardiac medications before your next appointment, please call your pharmacy*  Lab Work: None ordered  If you have labs (blood work) drawn today and your tests are completely normal, you will receive your results only by: MyChart Message (if you have MyChart) OR A paper copy in the mail If you have any lab test that is abnormal or we need to change your treatment, we will call you to review the results.  Testing/Procedures: None ordered  Follow-Up: At [facility], you and your health needs are our priority.  As part of our continuing mission to provide you with exceptional heart care, our providers are all part of one team.  This team includes your primary Cardiologist (physician) and Advanced Practice Providers or APPs (Physician Assistants and Nurse Practitioners) who all work together to provide you with the care you need, when you need it.  Your next appointment:   4 month(s)  Provider:   Katlyn West, NP          We recommend signing up for the patient portal called MyChart.  Sign up information is provided on this After Visit Summary.  MyChart is used to connect with patients for Virtual Visits (Telemedicine).  Patients are able to view lab/test results, encounter notes, upcoming appointments, etc.  Non-urgent messages can be sent to your provider as well.   To learn more about what you can do with MyChart, go to [email or web address].   Other Instructions

## 2023-12-20 ENCOUNTER — Ambulatory Visit: Admitting: Endocrinology

## 2023-12-23 ENCOUNTER — Ambulatory Visit (INDEPENDENT_AMBULATORY_CARE_PROVIDER_SITE_OTHER): Admitting: Internal Medicine

## 2023-12-23 ENCOUNTER — Encounter: Payer: Self-pay | Admitting: Internal Medicine

## 2023-12-23 VITALS — BP 130/88 | HR 61 | Ht 61.0 in | Wt 167.0 lb

## 2023-12-23 DIAGNOSIS — M457 Ankylosing spondylitis of lumbosacral region: Secondary | ICD-10-CM | POA: Diagnosis not present

## 2023-12-23 DIAGNOSIS — C9000 Multiple myeloma not having achieved remission: Secondary | ICD-10-CM | POA: Diagnosis not present

## 2023-12-23 DIAGNOSIS — M7918 Myalgia, other site: Secondary | ICD-10-CM

## 2023-12-23 DIAGNOSIS — Z853 Personal history of malignant neoplasm of breast: Secondary | ICD-10-CM | POA: Diagnosis not present

## 2023-12-23 DIAGNOSIS — I10 Essential (primary) hypertension: Secondary | ICD-10-CM | POA: Diagnosis not present

## 2023-12-23 MED ORDER — METHYLPREDNISOLONE ACETATE 80 MG/ML IJ SUSP
80.0000 mg | Freq: Once | INTRAMUSCULAR | Status: AC
  Administered 2023-12-23: 80 mg via INTRAMUSCULAR

## 2023-12-23 NOTE — Progress Notes (Signed)
 Patient Care Team: Perri Ronal PARAS, MD as PCP - General (Internal Medicine) Court Dorn PARAS, MD as PCP - Cardiology (Cardiology)  Visit Date: 12/23/23  Subjective:    Patient ID: Sonya Franklin , Female   DOB: 09/20/1965, 58 y.o.    MRN: [id number]   58 y.o. Female presents today for lightheadedness, musculoskeletal pain. 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 .    She says that at time she will feel light headed and like she is going to pass out. She wonders if her blood pressure is spiking or dropping. She recently increased her Lopressor  25 mg from one a day to twice a day. She also takes Aspirin 81 mg daily.  She wonders if her blood sugar is responsible for lightheadedness. 07/09/2023 HgbA1c 5.7. she had an echocardiogram on 10/22/2023 that indicated LVEF 60 to 65%, no RWMA, diastolic parameters were normal, RV systolic function and size was normal, LA size was mildly dilated, mild mitral valve regurgitation with no evidence of stenosis patient with moderate aortic valve stenosis, regurgitation was mild. 14-day ZIO monitor indicated an average heart rate of 70 bpm ranging from 41 to 150 bpm. Predominant underlying rhythm was sinus rhythm, there was 1 run of supraventricular tachycardia lasting 8 beats with a max rate of 121 bpm patient with occasional PACs and PVCs.    History of Hypertension; Palpitations treated with Amlodipine  5 mg daily, Metoprolol  tartrate 25 up to twice daily. She says that her heart rate tends to be tachycardic if she doesn't take her Metoprolol . Blood  Pressure: normal today at 130/88. Dependent Edema treated with Lasix  20 mg or HCTZ 25 mg daily. Hypokalemia treated with Klor-Con  40 meq daily.    History of Musculoskeletal Pain; Ankylosing Spondylitis treated with Tylenol  and occasionally Depo-Medrol  80 mg IM in this office. She requests this today.    History of Multiple Myeloma treated with Revlimid  10 mg daily x 21 days, none for 7 days. Followed by Dr. Norleen Kidney, Oncologist, who she last saw on 05/14/2023. 06/11/2023 Multiple Myeloma Panel showed M protein was undetectable. Kappa light chain decreased to 24.4 from 25.2 in February.    Health maintenance: She had an eye exam recently.   Past Medical History:  Diagnosis Date   Ankylosing spondylitis (HCC)    Anxiety    Arthritis    Breast cancer (HCC)    right breast   Bursitis of hip, right    Cardiac arrhythmia    Carotid stenosis 08/26/2021   GE reflux    Hiatal hernia    Hypertension    Hyperthyroidism    Ischemic bowel disease    Multiple myeloma not having achieved remission (HCC)    Prediabetes 08/26/2021   Snoring 03/21/2014   Vitamin D  deficiency      Family History  Problem Relation Age of Onset   Thyroid  disease Mother    Diabetes Mother    Hypertension Mother    Hyperlipidemia Mother    High blood pressure Father    High Cholesterol Father    Colitis Brother    Goiter Maternal Grandmother    Breast cancer Maternal Aunt     Social History   Social History Narrative  Caffeine occasional drinker.   Not working/disabled.  12th grader. Divorced, one kids.    Receives disability benefits due to hx of breast cancer 2007 and ankylosing spondylitis. Divorced. 1 special needs son who she resides. Non-smoker and no alcohol consumption. Occasionally drinks caffeine.    Right handed   Caffeine occas   One level stays on this level      Fhx:   Maternal Grandmother w/ hx of Goiter   Mother w/ hx of Breast Cancer, diagnosed at 49, Diabetes, Hyperlipidemia,  Hypertension, and Thyroid  Disease.    Father w/ hx of Prostate Cancer, High Cholesterol, and High Blood Pressure     Maternal Aunt w/ hx of Breast Cancer.    3 Brothers - 1 w/ hx of Colitis.      Review of Systems  Neurological:  Positive for dizziness.        Objective:   Vitals: BP 130/88   Pulse 61   Ht 5' 1 (1.549 m)   Wt 167 lb (75.8 kg)   SpO2 98%   BMI 31.55 kg/m    Physical Exam Vitals and nursing note reviewed.  Constitutional:      General: She is not in acute distress.    Appearance: Normal appearance. She is not toxic-appearing.  HENT:     Head: Normocephalic and atraumatic.  Cardiovascular:     Rate and Rhythm: Normal rate and regular rhythm. No extrasystoles are present.    Pulses: Normal pulses.     Heart sounds: Murmur heard.     No friction rub. No gallop.  Pulmonary:     Effort: Pulmonary effort is normal. No respiratory distress.     Breath sounds: Normal breath sounds. No wheezing or rales.  Skin:    General: Skin is warm and dry.  Neurological:     Mental Status: She is alert and oriented to person, place, and time. Mental status is at baseline.  Psychiatric:        Mood and Affect: Mood normal.        Behavior: Behavior normal.        Thought Content: Thought content normal.        Judgment: Judgment normal.       Results:    Echocardiogram on 10/22/2023 that indicated LVEF 60 to 65%, no RWMA, diastolic parameters were normal, RV systolic function and size was normal, LA size was mildly dilated, mild mitral valve regurgitation with no evidence of stenosis patient with moderate aortic valve stenosis, regurgitation was mild. 14-day ZIO monitor indicated an average heart rate of 70 bpm ranging from 41 to 150 bpm. Predominant underlying rhythm was sinus rhythm, there was 1 run of supraventricular tachycardia lasting 8 beats with a max rate of 121 bpm patient with occasional PACs and PVCs.    Labs:       Component Value Date/Time   NA 140  11/26/2023 1322   K 3.8 11/26/2023 1322   CL 108 11/26/2023 1322   CO2 27 11/26/2023 1322   GLUCOSE 90 11/26/2023 1322   BUN 12 11/26/2023 1322   CREATININE 0.71 11/26/2023 1322   CREATININE 0.75 10/30/2022 1200   CALCIUM  9.0 11/26/2023 1322   PROT 7.6 11/26/2023 1322   ALBUMIN 4.4 11/26/2023 1322   AST 12 (L) 11/26/2023 1322   ALT 14 11/26/2023 1322   ALKPHOS 72 11/26/2023 1322   BILITOT 0.4 11/26/2023 1322   GFRNONAA >60 11/26/2023 1322   GFRNONAA 108 04/05/2020 1128   GFRAA 126 04/05/2020  1128     Lab Results  Component Value Date   WBC 5.3 11/26/2023   HGB 12.2 11/26/2023   HCT 36.9 11/26/2023   MCV 88.7 11/26/2023   PLT 212 11/26/2023    Lab Results  Component Value Date   CHOL 196 07/09/2023   HDL 96 07/09/2023   LDLCALC 86 07/09/2023   TRIG 58 07/09/2023   CHOLHDL 2.0 07/09/2023    Lab Results  Component Value Date   HGBA1C 5.7 (H) 07/09/2023     Lab Results  Component Value Date   TSH 0.93 07/19/2023         Assessment & Plan:   Lightheadedness: She says that at time she will feel light headed and like she is going to pass out. She wonders if her blood pressure is spiking or dropping. She recently increased her Lopressor  25 mg from one a day to twice a day. She also takes Aspirin 81 mg daily.  She wonders if her blood sugar is responsible for lightheadedness. 07/09/2023 HgbA1c 5.7. she had an echocardiogram on 10/22/2023 that indicated LVEF 60 to 65%, no RWMA, diastolic parameters were normal, RV systolic function and size was normal, LA size was mildly dilated, mild mitral valve regurgitation with no evidence of stenosis patient with moderate aortic valve stenosis, regurgitation was mild. 14-day ZIO monitor indicated an average heart rate of 70 bpm ranging from 41 to 150 bpm. Predominant underlying rhythm was sinus rhythm, there was 1 run of supraventricular tachycardia lasting 8 beats with a max rate of 121 bpm patient with occasional PACs and PVCs.     Advised to reduce Lopressor  25 back down to one daily and take a second tablet as needed.   Also advised that if she is concerned about her HgbA1c she can ask her oncologist to check it at her next appointment.   Hypertension; Palpitations treated with Amlodipine  5 mg daily, Metoprolol  tartrate 25 up to twice daily. She says that her heart rate tends to be tachycardic if she doesn't take her Metoprolol . Blood Pressure: normal today at 130/88. Dependent Edema treated with Lasix  20 mg or HCTZ 25 mg daily. Hypokalemia treated with Klor-Con  40 meq daily.    Musculoskeletal Pain; Ankylosing Spondylitis treated with Tylenol  and occasionally Depo-Medrol  80 mg IM in this office.   Received Depo-Medrol  Shot today.   Multiple Myeloma treated with Revlimid  10 mg daily x 21 days, none for 7 days. Followed by Dr. Norleen Kidney, Oncologist, who she last saw on 05/14/2023. 06/11/2023 Multiple Myeloma Panel showed M protein was undetectable. Kappa light chain decreased to 24.4 from 25.2 in February.    Health maintenance: She had an eye exam recently.     I,Sonya Franklin,acting as a scribe for Ronal JINNY Hailstone, MD.,have documented all relevant documentation on the behalf of Ronal JINNY Hailstone, MD,as directed by  Ronal JINNY Hailstone, MD while in the presence of Ronal JINNY Hailstone, MD.   I, Ronal JINNY Hailstone, MD, have reviewed all documentation for this visit. The documentation on 12/23/2023 for the exam, diagnosis, procedures, and orders are all accurate and complete.

## 2023-12-23 NOTE — Progress Notes (Signed)
   Subjective:    Patient ID: Sonya Franklin, female    DOB: 08-30-1965, 58 y.o.   MRN: [id number]  HPI 58 year old Female with chronic musculoskeletal pain with history of ankylosing spondylitis and Multiple myeloma seen today in acute pain.  Patient is followed by Dr. Federico, Oncologist.  Patient occasionally comes to this office for injections of Depo-Medrol  80 mg IM which has worked well for her intermittently for years for chronic musculoskeletal pain.    Many years ago she saw Dr. Charlena Officer, for musculoskeletal pain and was diagnosed with ankylosing spondylitis.  She was on low-dose prednisone  for a period of time and was subsequently changed to Celebrex  around 2005.  Subsequently was seen at [facility].  From time to time, I gave her a Depo-Medrol  injection 80 mg IM for musculoskeletal pain and she improves.  She is no longer seen by Rheumatology.  History of multinodular goiter.  Had biopsy in 2006 of complex thyroid  nodule right inferior lobe which showed a benign colloid nodule.  She had a nuclear medicine study done in August 2024 to evaluate hyperthyroidism.  Findings were consistent with toxic multinodular goiter.  Subsequently had radioactive iodine therapy.  History of right breast cancer diagnosed in 2007.  She had adjuvant chemotherapy.  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  Has done well since that time but has not been able to return to work after treatment.  She had 4 cycles of doxorubicin and cyclophosphamide followed by weekly Taxol x 4.  Subsequently underwent bilateral mastectomies November 2007 with implant reconstruction.  She took Femara briefly in 2012 but decided to discontinue it.  She took tamoxifen from January 2008 until June 2011.  History of osteopenia  History of hypertension treated with amlodipine .  History of GE reflux treated with Protonix .  History of vitamin D  deficiency treated with high-dose vitamin D   supplement.  Social history: Divorced.  Has a disabled son born in 67.  Does not smoke or consume alcohol.  Review of Systems     Objective:   Physical Exam  Vital signs reviewed.  Skin: Warm and dry.  No cervical adenopathy.  Chest clear to auscultation.  Cardiac exam: Regular rate and rhythm without ectopy.  No lower extremity pitting edema.      Assessment & Plan:  History of ankylosing spondylitis and chronic musculoskeletal pain that responds to intermittent Depo-Medrol  80 mg IM from time to time.  She was given injection today.  Due for Medicare wellness and health maintenance exam after October 12, 2024.

## 2023-12-24 ENCOUNTER — Other Ambulatory Visit: Payer: Self-pay | Admitting: Hematology and Oncology

## 2023-12-24 ENCOUNTER — Inpatient Hospital Stay: Attending: Physician Assistant

## 2023-12-24 DIAGNOSIS — C9000 Multiple myeloma not having achieved remission: Secondary | ICD-10-CM

## 2024-01-02 NOTE — Patient Instructions (Signed)
 Patient given 80 mg IM Depo-Medrol  today at her request for history of chronic musculoskeletal pain with previous diagnosis of ankylosing spondylitis.  Medicare wellness and health maintenance exam due after October 12, 2024.

## 2024-01-12 ENCOUNTER — Other Ambulatory Visit: Payer: Self-pay | Admitting: *Deleted

## 2024-01-12 DIAGNOSIS — C9000 Multiple myeloma not having achieved remission: Secondary | ICD-10-CM

## 2024-01-12 MED ORDER — LENALIDOMIDE 10 MG PO CAPS: 10.0000 mg | ORAL_CAPSULE | Freq: Every day | ORAL | 0 refills | Status: DC

## 2024-01-21 ENCOUNTER — Other Ambulatory Visit: Payer: Self-pay

## 2024-01-21 ENCOUNTER — Inpatient Hospital Stay (HOSPITAL_BASED_OUTPATIENT_CLINIC_OR_DEPARTMENT_OTHER): Admitting: Physician Assistant

## 2024-01-21 ENCOUNTER — Inpatient Hospital Stay: Attending: Physician Assistant

## 2024-01-21 VITALS — BP 144/62 | HR 72 | Temp 97.0°F | Resp 16 | Ht 61.0 in | Wt 169.0 lb

## 2024-01-21 DIAGNOSIS — C9000 Multiple myeloma not having achieved remission: Secondary | ICD-10-CM

## 2024-01-21 LAB — CBC WITH DIFFERENTIAL (CANCER CENTER ONLY)
Abs Immature Granulocytes: 0.02 K/uL (ref 0.00–0.07)
Basophils Absolute: 0.1 K/uL (ref 0.0–0.1)
Basophils Relative: 1 %
Eosinophils Absolute: 0.2 K/uL (ref 0.0–0.5)
Eosinophils Relative: 4 %
HCT: 37.3 % (ref 36.0–46.0)
Hemoglobin: 12.4 g/dL (ref 12.0–15.0)
Immature Granulocytes: 0 %
Lymphocytes Relative: 23 %
Lymphs Abs: 1.1 K/uL (ref 0.7–4.0)
MCH: 29.3 pg (ref 26.0–34.0)
MCHC: 33.2 g/dL (ref 30.0–36.0)
MCV: 88.2 fL (ref 80.0–100.0)
Monocytes Absolute: 0.7 K/uL (ref 0.1–1.0)
Monocytes Relative: 14 %
Neutro Abs: 2.8 K/uL (ref 1.7–7.7)
Neutrophils Relative %: 58 %
Platelet Count: 208 K/uL (ref 150–400)
RBC: 4.23 MIL/uL (ref 3.87–5.11)
RDW: 14 % (ref 11.5–15.5)
WBC Count: 4.8 K/uL (ref 4.0–10.5)
nRBC: 0 % (ref 0.0–0.2)

## 2024-01-21 LAB — CMP (CANCER CENTER ONLY)
ALT: 16 U/L (ref 0–44)
AST: 13 U/L — ABNORMAL LOW (ref 15–41)
Albumin: 4.3 g/dL (ref 3.5–5.0)
Alkaline Phosphatase: 78 U/L (ref 38–126)
Anion gap: 7 (ref 5–15)
BUN: 10 mg/dL (ref 6–20)
CO2: 27 mmol/L (ref 22–32)
Calcium: 9.1 mg/dL (ref 8.9–10.3)
Chloride: 107 mmol/L (ref 98–111)
Creatinine: 0.78 mg/dL (ref 0.44–1.00)
GFR, Estimated: >60 mL/min (ref >60)
Glucose, Bld: 104 mg/dL — ABNORMAL HIGH (ref 70–99)
Potassium: 3.7 mmol/L (ref 3.5–5.1)
Sodium: 141 mmol/L (ref 135–145)
Total Bilirubin: 0.5 mg/dL (ref 0.0–1.2)
Total Protein: 7.7 g/dL (ref 6.5–8.1)

## 2024-01-21 LAB — IRON AND IRON BINDING CAPACITY (CC-WL,HP ONLY)
Iron: 61 ug/dL (ref 28–170)
Saturation Ratios: 17 % (ref 10.4–31.8)
TIBC: 370 ug/dL (ref 250–450)
UIBC: 309 ug/dL (ref 148–442)

## 2024-01-21 LAB — FERRITIN: Ferritin: 161 ng/mL (ref 11–307)

## 2024-01-21 LAB — LACTATE DEHYDROGENASE: LDH: 161 U/L (ref 98–192)

## 2024-01-21 LAB — MAGNESIUM: Magnesium: 1.8 mg/dL (ref 1.7–2.4)

## 2024-01-21 NOTE — Progress Notes (Signed)
 / [facility] [phone]   [phone]  PROGRESS NOTE  Patient Care Team: Perri Ronal PARAS, MD as PCP - General (Internal Medicine) Court Dorn PARAS, MD as PCP - Cardiology (Cardiology)  Hematological/Oncological History # Multiple Myeloma with Plasmacytoma 03/03/2022: established with the [facility] in Oncology  04/15/2022: CT biopsy and bone marrow biopsy confirmed the presence of a plasmacytoma and bone marrow involvement of multiple myeloma.  05/05/2022: Cycle 1 Day 1 of VRD therapy 05/27/2022: Cycle 2 Day 1 of VRD therapy 06/17/2022: Cycle 3 Day 1 of VRD therapy 07/08/2022: Cycle 4 Day 1 of VRD therapy 07/29/2022: Cycle 5 Day 1 of VRD therapy 08/19/2022: Cycle 6 Day 1 of VRD therapy 09/09/2022: Cycle 7 Day 1 of VRD therapy 09/30/2022: Cycle 8 Day 1 of VRD therapy 10/23/2022: Started maintenance Revlimid  therapy  Interval History:  Sonya Franklin 58 y.o. female with medical history significant for recently diagnosed multiple myeloma with plasmacytoma who presents for a follow up visit. The patient's last visit was on 10/29/2023. She presents today for a follow up for maintenance Revlimid  therapy.   On exam today Sonya Franklin reports she is doing well and tolerating treatment without any side effects.  She continues to have fatigue but she is able to complete her daily activities on her own.  She has good appetite without any weight changes.  She denies nausea, vomiting or bowel habit changes.  Her pain is persistent but she is able to ambulate and complete her ADLs.  She has noticed cold sensitivity and some muscle cramps mainly in her thighs.  She denies easy bruising or signs of active bleeding.  She denies fevers, chills, night sweats, shortness of breath, chest pain or cough.  She has no other complaints.  Rest of the 10 point ROS as below.  MEDICAL HISTORY:  Past Medical History:  Diagnosis Date   Ankylosing spondylitis (HCC)    Anxiety    Arthritis     Breast cancer (HCC)    right breast   Bursitis of hip, right    Cardiac arrhythmia    Carotid stenosis 08/26/2021   GE reflux    Hiatal hernia    Hypertension    Hyperthyroidism    Ischemic bowel disease    Multiple myeloma not having achieved remission (HCC)    Prediabetes 08/26/2021   Snoring 03/21/2014   Vitamin D  deficiency     SURGICAL HISTORY: Past Surgical History:  Procedure Laterality Date   ABDOMINAL HYSTERECTOMY     BREAST LUMPECTOMY     right   MASTECTOMY     double    SOCIAL HISTORY: Social History   Socioeconomic History   Marital status: Divorced    Spouse name: Not on file   Number of children: 1   Years of education: Not on file   Highest education level: Not on file  Occupational History    Employer: UNEMPLOYED  Tobacco Use   Smoking status: Never   Smokeless tobacco: Never  Vaping Use   Vaping status: Never Used  Substance and Sexual Activity   Alcohol use: No   Drug use: No   Sexual activity: Yes  Other Topics Concern   Not on file  Social History Narrative   Caffeine occasional drinker.   Not working/disabled.  12th grader. Divorced, one kids.    Receives disability benefits due to hx of breast cancer 2007 and ankylosing spondylitis. Divorced. 1 special needs son who she resides. Non-smoker and no alcohol consumption.  Occasionally drinks caffeine.    Right handed   Caffeine occas   One level stays on this level      Fhx:   Maternal Grandmother w/ hx of Goiter   Mother w/ hx of Breast Cancer, diagnosed at 42, Diabetes, Hyperlipidemia, Hypertension, and Thyroid  Disease.    Father w/ hx of Prostate Cancer, High Cholesterol, and High Blood Pressure     Maternal Aunt w/ hx of Breast Cancer.    3 Brothers - 1 w/ hx of Colitis.   Social Drivers of Corporate Investment Banker Strain: Low Risk  (10/13/2023)   Overall Financial Resource Strain (CARDIA)    Difficulty of Paying Living Expenses: Not hard at all  Food Insecurity: No Food  Insecurity (10/13/2023)   Hunger Vital Sign    Worried About Running Out of Food in the Last Year: Never true    Ran Out of Food in the Last Year: Never true  Transportation Needs: No Transportation Needs (10/13/2023)   PRAPARE - Administrator, Civil Service (Medical): No    Lack of Transportation (Non-Medical): No  Physical Activity: Inactive (10/13/2023)   Exercise Vital Sign    Days of Exercise per Week: 0 days    Minutes of Exercise per Session: 0 min  Stress: No Stress Concern Present (10/13/2023)   [employer] of Occupational Health - Occupational Stress Questionnaire    Feeling of Stress: Not at all  Social Connections: Socially Isolated (10/13/2023)   Social Connection and Isolation Panel    Frequency of Communication with Friends and Family: More than three times a week    Frequency of Social Gatherings with Friends and Family: More than three times a week    Attends Religious Services: Never    Database Administrator or Organizations: No    Attends Banker Meetings: Never    Marital Status: Divorced  Catering Manager Violence: Not At Risk (10/13/2023)   Humiliation, Afraid, Rape, and Kick questionnaire    Fear of Current or Ex-Partner: No    Emotionally Abused: No    Physically Abused: No    Sexually Abused: No    FAMILY HISTORY: Family History  Problem Relation Age of Onset   Thyroid  disease Mother    Diabetes Mother    Hypertension Mother    Hyperlipidemia Mother    High blood pressure Father    High Cholesterol Father    Colitis Brother    Goiter Maternal Grandmother    Breast cancer Maternal Aunt     ALLERGIES:  is allergic to shellfish allergy, blue dyes (parenteral), and hydrochlorothiazide .  MEDICATIONS:  Current Outpatient Medications  Medication Sig Dispense Refill   acetaminophen  (TYLENOL ) 500 MG tablet Take 500 mg by mouth every 6 (six) hours as needed for moderate pain. (Patient taking differently: Take 500 mg by mouth 3  (three) times daily.)     ALPRAZolam  (XANAX ) 0.5 MG tablet Take 1 tablet (0.5 mg total) by mouth 3 (three) times daily as needed for anxiety. 60 tablet 2   amLODipine  (NORVASC ) 5 MG tablet TAKE 1 TABLET (5 MG TOTAL) BY MOUTH DAILY. 90 tablet 2   aspirin EC 81 MG tablet Take 81 mg by mouth daily. Swallow whole.     azithromycin  (ZITHROMAX  Z-PAK) 250 MG tablet Take 2 pill on Day 1, take 1 pill on Day 2-5. 6 each 0   dicyclomine  (BENTYL ) 20 MG tablet One tab before meals and at bedtime as needed for diarrhea with  irritable bowel syndrome 60 tablet 1   furosemide  (LASIX ) 20 MG tablet TAKE 1 TABLET BY MOUTH EVERY DAY AS NEEDED 90 tablet 0   KLOR-CON  M20 20 MEQ tablet TAKE 2 TABLETS BY MOUTH EVERY DAY 180 tablet 1   lenalidomide  (REVLIMID ) 10 MG capsule Take 1 capsule (10 mg total) by mouth daily. Auth #   [id number] Date Obtained 01/12/24  Take 1 capsule daily for 21 days then none for 7 days 21 capsule 0   lidocaine  (LIDODERM ) 5 % Place 1 patch onto the skin daily. Remove & Discard patch within 12 hours or as directed by MD 30 patch 1   metoprolol  tartrate (LOPRESSOR ) 25 MG tablet Take 1 tablet (25 mg total) by mouth 2 (two) times daily. TAKE 1 TAB BY MOUTH UP TO TWICE DAILY IF NEEDED FOR PALPITATIONS 180 tablet 3   nystatin  (MYCOSTATIN ) 100000 UNIT/ML suspension Take 5 mLs (500,000 Units total) by mouth 4 (four) times daily. 60 mL 0   olopatadine  (PATANOL) 0.1 % ophthalmic solution Place 1 drop into both eyes 2 (two) times daily as needed. 5 mL PRN   pantoprazole  (PROTONIX ) 40 MG tablet TAKE 1 TABLET BY MOUTH EVERY DAY 90 tablet 3   triamcinolone  cream (KENALOG ) 0.1 % Apply 1 Application topically 2 (two) times daily. 30 g 0   No current facility-administered medications for this visit.    REVIEW OF SYSTEMS:   Constitutional: ( - ) fevers, ( - )  chills , ( - ) night sweats Eyes: ( - ) blurriness of vision, ( - ) double vision, ( - ) watery eyes Ears, nose, mouth, throat, and face: ( - ) mucositis,  ( - ) sore throat Respiratory: ( - ) cough, ( - ) dyspnea, ( - ) wheezes Cardiovascular: ( - ) palpitation, ( - ) chest discomfort, ( + ) lower extremity swelling Gastrointestinal:  ( - ) nausea, ( - ) heartburn, ( - ) change in bowel habits Skin: ( - ) abnormal skin rashes Lymphatics: ( - ) new lymphadenopathy, ( - ) easy bruising Neurological: ( - ) numbness, ( - ) tingling, ( - ) new weaknesses Behavioral/Psych: ( - ) mood change, ( - ) new changes  All other systems were reviewed with the patient and are negative.  PHYSICAL EXAMINATION: ECOG PERFORMANCE STATUS: 1 - Symptomatic but completely ambulatory  Vitals:   01/21/24 1326  BP: (!) 144/62  Pulse: 72  Resp: 16  Temp: (!) 97 F (36.1 C)  SpO2: 100%   Filed Weights   01/21/24 1326  Weight: 169 lb (76.7 kg)    GENERAL: Well-appearing middle-aged African-American female, alert, no distress and comfortable SKIN: skin color, texture, turgor are normal, no rashes or significant lesions EYES: conjunctiva are pink and non-injected, sclera clear LUNGS: clear to auscultation and percussion with normal breathing effort HEART: regular rate & rhythm and no murmurs. Improving bilateral lower extremity edema. Musculoskeletal: no cyanosis of digits and no clubbing  PSYCH: alert & oriented x 3, fluent speech NEURO: no focal motor/sensory deficits  LABORATORY DATA:  I have reviewed the data as listed    Latest Ref Rng & Units 01/21/2024    1:01 PM 11/26/2023    1:22 PM 10/29/2023    2:38 PM  CBC  WBC 4.0 - 10.5 K/uL 4.8  5.3  5.4   Hemoglobin 12.0 - 15.0 g/dL 87.5  87.7  88.4   Hematocrit 36.0 - 46.0 % 37.3  36.9  34.9   Platelets  150 - 400 K/uL 208  212  190        Latest Ref Rng & Units 11/26/2023    1:22 PM 10/29/2023    2:38 PM 10/01/2023    1:36 PM  CMP  Glucose 70 - 99 mg/dL 90  94  94   BUN 6 - 20 mg/dL 12  11  10    Creatinine 0.44 - 1.00 mg/dL 9.28  9.21  9.26   Sodium 135 - 145 mmol/L 140  142  143   Potassium 3.5 -  5.1 mmol/L 3.8  3.6  3.5   Chloride 98 - 111 mmol/L 108  106  108   CO2 22 - 32 mmol/L 27  28  29    Calcium  8.9 - 10.3 mg/dL 9.0  9.0  9.2   Total Protein 6.5 - 8.1 g/dL 7.6  7.0  7.6   Total Bilirubin 0.0 - 1.2 mg/dL 0.4  0.3  0.3   Alkaline Phos 38 - 126 U/L 72  79  84   AST 15 - 41 U/L 12  14  12    ALT 0 - 44 U/L 14  18  15      Lab Results  Component Value Date   MPROTEIN Not Observed 11/26/2023   MPROTEIN Not Observed 10/29/2023   MPROTEIN Not Observed 10/01/2023   Lab Results  Component Value Date   KPAFRELGTCHN 16.6 11/26/2023   KPAFRELGTCHN 19.9 (H) 10/29/2023   KPAFRELGTCHN 20.5 (H) 10/01/2023   LAMBDASER 13.0 11/26/2023   LAMBDASER 14.7 10/29/2023   LAMBDASER 14.7 10/01/2023   KAPLAMBRATIO 1.28 11/26/2023   KAPLAMBRATIO 1.35 10/29/2023   KAPLAMBRATIO 1.39 10/01/2023     RADIOGRAPHIC STUDIES: No results found.   ASSESSMENT & PLAN Sonya Franklin is a 58 y.o. female with medical history significant for newly diagnosed multiple myeloma with plasmacytoma who presents for a follow up visit.  # Multiple Myeloma with Plasmactyoma --Confirmed with L5 bone lesion biopsy and bone marrow biopsy. --Started VRD therapy on 05/05/2022.  --Patient completed her consultation with [facility] B[facility] on 08/13/2022.  She notes that she would prefer to pursue maintenance therapy over bone marrow transplant. --After 8 cycles, transitioned to maintenance Revlimid  10 mg PO daily 21 days on, 7 days off on 10/23/2022.  PLAN: --Currently on maintenance therapy with Revlimid  10 mg p.o. daily 21 days on and 7 days off. --Labs today showed WBC 4.8, Hgb 12.4, MCV 88.2, Plt 208K, creatinine and calcium  levels are normal. --Last myeloma labs from 11/26/2023 showed M protein was undetectablle. Kappa light chain stable at 23.1.  --Continue with Revlimid  therapy without any dose modifications.  --RTC in 4 weeks for labs and 12 weeks for clinic visit   #B/L lower extremity edema: --Doppler US   from 11/03/2022 ruled out DVT --Edema has improved after undergoing RAI I-131 therapy for thyroid  disease on 12/28/2022.  --Continue on lasix  therapy managed by PCP  #Normocytic anemia--resolved: --Etiologies include Revlimid  therapy or iron deficiency  --received 1 dose of Feraheme on 11/24/2022.  -- Recheck iron panel today due to cold sensitivity.  # Right lower extremity neuropathy: --Greatly improved --Monitor for now.   # Cancer related pain-improved --Secondary to focal tumor replacement of the right-side of the L5 vertebral body extending into the right L5 pedicle and posterior elements with a small extraosseous component extending into the right L5 neural foramen with moderate-severe stenosi -- Unable to tolerate opoid medications -- Under the care of pallative care. Medication includes lidocaine  pain patch and  tylenol  --Patient is not interested in palliative radiation at this time.   #Supportive Care -- chemotherapy education completed -- port placement not required.  -- Pain medication as noted above  No orders of the defined types were placed in this encounter.  All questions were answered. The patient knows to call the clinic with any problems, questions or concerns.  I have spent a total of 30 minutes minutes of face-to-face and non-face-to-face time, preparing to see the patient, performing a medically appropriate examination, counseling and educating the patient, documenting clinical information in the electronic health record, independently interpreting results and communicating results to the patient, and care coordination.   Johnston Police PA-C Dept of Hematology and Oncology [facility] at [facility] Phone: [phone]  1.  Hematologist 01/21/2024 1:37 PM

## 2024-01-24 ENCOUNTER — Ambulatory Visit: Payer: Self-pay | Admitting: Physician Assistant

## 2024-01-24 LAB — KAPPA/LAMBDA LIGHT CHAINS
Kappa free light chain: 23.2 mg/L — ABNORMAL HIGH (ref 3.3–19.4)
Kappa, lambda light chain ratio: 1.41 (ref 0.26–1.65)
Lambda free light chains: 16.5 mg/L (ref 5.7–26.3)

## 2024-01-26 LAB — MULTIPLE MYELOMA PANEL, SERUM
Albumin SerPl Elph-Mcnc: 3.7 g/dL (ref 2.9–4.4)
Albumin/Glob SerPl: 1.1 (ref 0.7–1.7)
Alpha 1: 0.3 g/dL (ref 0.0–0.4)
Alpha2 Glob SerPl Elph-Mcnc: 0.8 g/dL (ref 0.4–1.0)
B-Globulin SerPl Elph-Mcnc: 1.2 g/dL (ref 0.7–1.3)
Gamma Glob SerPl Elph-Mcnc: 1.3 g/dL (ref 0.4–1.8)
Globulin, Total: 3.7 g/dL (ref 2.2–3.9)
IgA: 278 mg/dL (ref 87–352)
IgG (Immunoglobin G), Serum: 1397 mg/dL (ref 586–1602)
IgM (Immunoglobulin M), Srm: 30 mg/dL (ref 26–217)
Total Protein ELP: 7.4 g/dL (ref 6.0–8.5)

## 2024-02-07 ENCOUNTER — Encounter: Payer: Self-pay | Admitting: Oncology

## 2024-02-11 ENCOUNTER — Other Ambulatory Visit: Payer: Self-pay | Admitting: *Deleted

## 2024-02-11 DIAGNOSIS — C9000 Multiple myeloma not having achieved remission: Secondary | ICD-10-CM

## 2024-02-11 MED ORDER — LENALIDOMIDE 10 MG PO CAPS: 10.0000 mg | ORAL_CAPSULE | Freq: Every day | ORAL | 0 refills | Status: DC

## 2024-02-17 ENCOUNTER — Telehealth: Payer: Self-pay

## 2024-02-17 NOTE — Telephone Encounter (Signed)
 Pt left message that she needed to cancel her appt scheduled for tomorrow due to predictions of bad weather. Informed pt that scheduling will call her to reschedule her appt. She voiced understanding. Staff message sent to scheduling dept to reschedule.   She also stated that her Revlimid  needs refilling per the pharmacy. Checked medications and it is noted that Revilimid was refilled 02/11/24.  Pt states she has contact information to her pharmacy and will let us  know if refill remains an issue.

## 2024-02-18 ENCOUNTER — Inpatient Hospital Stay

## 2024-02-21 ENCOUNTER — Inpatient Hospital Stay: Attending: Physician Assistant

## 2024-02-21 DIAGNOSIS — C9000 Multiple myeloma not having achieved remission: Secondary | ICD-10-CM | POA: Insufficient documentation

## 2024-02-21 LAB — CBC WITH DIFFERENTIAL (CANCER CENTER ONLY)
Abs Immature Granulocytes: 0.01 K/uL (ref 0.00–0.07)
Basophils Absolute: 0.1 K/uL (ref 0.0–0.1)
Basophils Relative: 1 %
Eosinophils Absolute: 0.1 K/uL (ref 0.0–0.5)
Eosinophils Relative: 2 %
HCT: 36.2 % (ref 36.0–46.0)
Hemoglobin: 11.9 g/dL — ABNORMAL LOW (ref 12.0–15.0)
Immature Granulocytes: 0 %
Lymphocytes Relative: 28 %
Lymphs Abs: 1.4 K/uL (ref 0.7–4.0)
MCH: 29.1 pg (ref 26.0–34.0)
MCHC: 32.9 g/dL (ref 30.0–36.0)
MCV: 88.5 fL (ref 80.0–100.0)
Monocytes Absolute: 0.7 K/uL (ref 0.1–1.0)
Monocytes Relative: 15 %
Neutro Abs: 2.7 K/uL (ref 1.7–7.7)
Neutrophils Relative %: 54 %
Platelet Count: 206 K/uL (ref 150–400)
RBC: 4.09 MIL/uL (ref 3.87–5.11)
RDW: 13.9 % (ref 11.5–15.5)
WBC Count: 5 K/uL (ref 4.0–10.5)
nRBC: 0 % (ref 0.0–0.2)

## 2024-02-21 LAB — CMP (CANCER CENTER ONLY)
ALT: 16 U/L (ref 0–44)
AST: 18 U/L (ref 15–41)
Albumin: 4.5 g/dL (ref 3.5–5.0)
Alkaline Phosphatase: 75 U/L (ref 38–126)
Anion gap: 11 (ref 5–15)
BUN: 8 mg/dL (ref 6–20)
CO2: 25 mmol/L (ref 22–32)
Calcium: 9.2 mg/dL (ref 8.9–10.3)
Chloride: 106 mmol/L (ref 98–111)
Creatinine: 0.65 mg/dL (ref 0.44–1.00)
GFR, Estimated: >60 mL/min (ref >60)
Glucose, Bld: 89 mg/dL (ref 70–99)
Potassium: 3.5 mmol/L (ref 3.5–5.1)
Sodium: 142 mmol/L (ref 135–145)
Total Bilirubin: 0.4 mg/dL (ref 0.0–1.2)
Total Protein: 7.9 g/dL (ref 6.5–8.1)

## 2024-02-21 LAB — LACTATE DEHYDROGENASE: LDH: 197 U/L (ref 105–235)

## 2024-02-22 LAB — KAPPA/LAMBDA LIGHT CHAINS
Kappa free light chain: 19.4 mg/L (ref 3.3–19.4)
Kappa, lambda light chain ratio: 1.34 (ref 0.26–1.65)
Lambda free light chains: 14.5 mg/L (ref 5.7–26.3)

## 2024-02-23 LAB — MULTIPLE MYELOMA PANEL, SERUM
Albumin SerPl Elph-Mcnc: 4 g/dL (ref 2.9–4.4)
Albumin/Glob SerPl: 1.2 (ref 0.7–1.7)
Alpha 1: 0.3 g/dL (ref 0.0–0.4)
Alpha2 Glob SerPl Elph-Mcnc: 0.7 g/dL (ref 0.4–1.0)
B-Globulin SerPl Elph-Mcnc: 1.1 g/dL (ref 0.7–1.3)
Gamma Glob SerPl Elph-Mcnc: 1.3 g/dL (ref 0.4–1.8)
Globulin, Total: 3.5 g/dL (ref 2.2–3.9)
IgA: 259 mg/dL (ref 87–352)
IgG (Immunoglobin G), Serum: 1317 mg/dL (ref 586–1602)
IgM (Immunoglobulin M), Srm: 29 mg/dL (ref 26–217)
Total Protein ELP: 7.5 g/dL (ref 6.0–8.5)

## 2024-02-24 ENCOUNTER — Other Ambulatory Visit: Payer: Self-pay | Admitting: Internal Medicine

## 2024-02-29 ENCOUNTER — Ambulatory Visit: Admitting: Cardiovascular Disease

## 2024-03-02 ENCOUNTER — Ambulatory Visit: Admitting: Internal Medicine

## 2024-03-02 ENCOUNTER — Encounter: Payer: Self-pay | Admitting: Internal Medicine

## 2024-03-02 ENCOUNTER — Other Ambulatory Visit: Payer: Self-pay | Admitting: Internal Medicine

## 2024-03-02 VITALS — BP 148/100 | HR 99 | Temp 98.2°F | Ht 61.0 in | Wt 169.0 lb

## 2024-03-02 DIAGNOSIS — M7918 Myalgia, other site: Secondary | ICD-10-CM

## 2024-03-02 DIAGNOSIS — J069 Acute upper respiratory infection, unspecified: Secondary | ICD-10-CM

## 2024-03-02 DIAGNOSIS — C9000 Multiple myeloma not having achieved remission: Secondary | ICD-10-CM

## 2024-03-02 DIAGNOSIS — Z853 Personal history of malignant neoplasm of breast: Secondary | ICD-10-CM

## 2024-03-02 DIAGNOSIS — I10 Essential (primary) hypertension: Secondary | ICD-10-CM

## 2024-03-02 DIAGNOSIS — M457 Ankylosing spondylitis of lumbosacral region: Secondary | ICD-10-CM

## 2024-03-02 MED ORDER — AZITHROMYCIN 250 MG PO TABS: ORAL_TABLET | ORAL | 0 refills | Status: AC

## 2024-03-02 MED ORDER — METHYLPREDNISOLONE ACETATE 80 MG/ML IJ SUSP
80.0000 mg | Freq: Once | INTRAMUSCULAR | Status: AC
  Administered 2024-03-02: 13:00:00 80 mg via INTRAMUSCULAR

## 2024-03-02 NOTE — Progress Notes (Addendum)
 "   Patient Care Team: Keyoni Lapinski, Ronal PARAS, MD as PCP - General (Internal Medicine) Court Dorn PARAS, MD as PCP - Cardiology (Cardiology)  Visit Date: 03/02/2024  Subjective:    Patient ID: Sonya Franklin , Female   DOB: 1965-07-22, 58 y.o.    MRN: [id number]   59 y.o. Female presents today for Sinus congestion. Patient has a past medical history of Ankylosing Spondylitis; Multiple Myeloma, hypertension.  She first began to feel sick three days ago when she felt congestion in her ears. She is currently experiencing sinus congestion, sore throat and a cough. She says one other member of her family is sick. She denies having a fever, chills, Nausea/vomiting. She also has a headache.     History of Musculoskeletal Pain; Ankylosing Spondylitis treated with Tylenol  and occasionally Depo-Medrol  80 mg IM in this office. She requests this today.    History of Multiple Myeloma treated with Revlimid  10 mg daily x 21 days, none for 7 days. Followed by Dr. Norleen Kidney, Oncologist, who she last saw on 05/14/2023. 06/11/2023 Multiple Myeloma Panel showed M protein was undetectable. Kappa light chain decreased to 24.4 from 25.2 in February.   Past Medical History:  Diagnosis Date   Ankylosing spondylitis (HCC)    Anxiety    Arthritis    Breast cancer (HCC)    right breast   Bursitis of hip, right    Cardiac arrhythmia    Carotid stenosis 08/26/2021   GE reflux    Hiatal hernia    Hypertension    Hyperthyroidism    Ischemic bowel disease    Multiple myeloma not having achieved remission (HCC)    Prediabetes 08/26/2021   Snoring 03/21/2014   Vitamin D  deficiency      Family History  Problem Relation Age of Onset   Thyroid  disease Mother    Diabetes Mother    Hypertension Mother    Hyperlipidemia Mother    High blood pressure Father    High Cholesterol Father    Colitis Brother    Goiter Maternal Grandmother    Breast cancer Maternal Aunt     Social History   Social History Narrative    Caffeine occasional drinker.   Not working/disabled.  12th grader. Divorced, one kids.    Receives disability benefits due to hx of breast cancer 2007 and ankylosing spondylitis. Divorced. 1 special needs son who she resides. Non-smoker and no alcohol consumption. Occasionally drinks caffeine.    Right handed   Caffeine occas   One level stays on this level      Fhx:   Maternal Grandmother w/ hx of Goiter   Mother w/ hx of Breast Cancer, diagnosed at 39, Diabetes, Hyperlipidemia, Hypertension, and Thyroid  Disease.    Father w/ hx of Prostate Cancer, High Cholesterol, and High Blood Pressure     Maternal Aunt w/ hx of Breast Cancer.    3 Brothers - 1 w/ hx of Colitis.      Review of Systems  Constitutional:  Negative for chills and fever.  HENT:  Positive for congestion, sinus pain and sore throat.   Respiratory:  Positive for cough and sputum production.   Gastrointestinal:  Negative for nausea and vomiting.  Neurological:  Positive for headaches.        Objective:   Vitals: BP (!) 148/100   Pulse 99   Temp 98.2 F (36.8 C)   Ht 5' 1 (1.549 m)   Wt 169 lb (76.7 kg)   SpO2 98%  BMI 31.93 kg/m    Physical Exam Vitals and nursing note reviewed.  Constitutional:      General: She is not in acute distress.    Appearance: Normal appearance. She is not ill-appearing.  HENT:     Head: Normocephalic and atraumatic.     Right Ear: Tympanic membrane, ear canal and external ear normal.     Left Ear: Tympanic membrane, ear canal and external ear normal.     Mouth/Throat:     Mouth: Mucous membranes are moist.     Pharynx: Oropharynx is clear. No oropharyngeal exudate or posterior oropharyngeal erythema.     Comments: Pharynx slightly injected.  Pulmonary:     Effort: Pulmonary effort is normal.     Breath sounds: Normal breath sounds. No wheezing, rhonchi or rales.  Lymphadenopathy:     Cervical: No cervical adenopathy.  Skin:    General: Skin is warm and dry.   Neurological:     Mental Status: She is alert and oriented to person, place, and time. Mental status is at baseline.  Psychiatric:        Mood and Affect: Mood normal.        Behavior: Behavior normal.        Thought Content: Thought content normal.        Judgment: Judgment normal.       Results:     Labs:       Component Value Date/Time   NA 142 02/21/2024 1329   K 3.5 02/21/2024 1329   CL 106 02/21/2024 1329   CO2 25 02/21/2024 1329   GLUCOSE 89 02/21/2024 1329   BUN 8 02/21/2024 1329   CREATININE 0.65 02/21/2024 1329   CREATININE 0.75 10/30/2022 1200   CALCIUM  9.2 02/21/2024 1329   PROT 7.9 02/21/2024 1329   ALBUMIN 4.5 02/21/2024 1329   AST 18 02/21/2024 1329   ALT 16 02/21/2024 1329   ALKPHOS 75 02/21/2024 1329   BILITOT 0.4 02/21/2024 1329   GFRNONAA >60 02/21/2024 1329   GFRNONAA 108 04/05/2020 1128   GFRAA 126 04/05/2020 1128     Lab Results  Component Value Date   WBC 5.0 02/21/2024   HGB 11.9 (L) 02/21/2024   HCT 36.2 02/21/2024   MCV 88.5 02/21/2024   PLT 206 02/21/2024    Lab Results  Component Value Date   CHOL 196 07/09/2023   HDL 96 07/09/2023   LDLCALC 86 07/09/2023   TRIG 58 07/09/2023   CHOLHDL 2.0 07/09/2023    Lab Results  Component Value Date   HGBA1C 5.7 (H) 07/09/2023     Lab Results  Component Value Date   TSH 0.93 07/19/2023        Assessment & Plan:   Meds ordered this encounter  Medications   azithromycin  (ZITHROMAX ) 250 MG tablet    Sig: Take 2 tablets on day 1, then 1 tablet daily on days 2 through 5    Dispense:  6 tablet    Refill:  0   methylPREDNISolone  acetate (DEPO-MEDROL ) injection 80 mg      Acute upper respiratory infection: She first began to feel sick three days ago when she felt congestion in her ears. She is currently experiencing sinus congestion, sore throat and a cough. She says one other member of her family is sick. She denies having a fever, chills, Nausea/vomiting. She also has a  headache.     Depo-medrol  80 mg IM injection received today.   Azithromycin  250 mg 2 tablets on  day one, one tablet daily on days 2-5 prescribed.     Musculoskeletal Pain; Ankylosing Spondylitis: treated with Tylenol  and occasionally Depo-Medrol  80 mg IM in this office. She requests this today.    Multiple Myeloma treated with Revlimid  10 mg daily x 21 days, none for 7 days. Followed by Dr. Norleen Kidney, Oncologist, who she last saw on 05/14/2023. 06/11/2023 Multiple Myeloma Panel showed M protein was undetectable. Kappa light chain decreased to 24.4 from 25.2 in February.      I,Makayla C Reid,acting as a scribe for Ronal JINNY Hailstone, MD.,have documented all relevant documentation on the behalf of Ronal JINNY Hailstone, MD,as directed by  Ronal JINNY Hailstone, MD while in the presence of Ronal JINNY Hailstone, MD.  I, Ronal JINNY Hailstone, MD, have reviewed all documentation for this visit. The documentation on 03/02/2024 for the exam, diagnosis, procedures, and orders are all accurate and complete.     "

## 2024-03-08 ENCOUNTER — Encounter: Payer: Self-pay | Admitting: Internal Medicine

## 2024-03-08 NOTE — Patient Instructions (Signed)
 You have been diagnosed with an acute upper respiratory infection. Depomedrol 80 mg Im given today per pt request for musculoskeletal pain and respiratory congestion. Myeloma is followed by Dr. Federico. Rest and stay well hydrated. Call if not better in 5-7 days or sooner if worse.

## 2024-03-17 ENCOUNTER — Other Ambulatory Visit: Payer: Self-pay | Admitting: *Deleted

## 2024-03-17 ENCOUNTER — Other Ambulatory Visit: Payer: Self-pay | Admitting: Hematology and Oncology

## 2024-03-17 ENCOUNTER — Inpatient Hospital Stay: Attending: Physician Assistant

## 2024-03-17 DIAGNOSIS — C9000 Multiple myeloma not having achieved remission: Secondary | ICD-10-CM

## 2024-03-17 LAB — CBC WITH DIFFERENTIAL (CANCER CENTER ONLY)
Abs Immature Granulocytes: 0.03 K/uL (ref 0.00–0.07)
Basophils Absolute: 0.1 K/uL (ref 0.0–0.1)
Basophils Relative: 1 %
Eosinophils Absolute: 0.2 K/uL (ref 0.0–0.5)
Eosinophils Relative: 5 %
HCT: 36.2 % (ref 36.0–46.0)
Hemoglobin: 11.8 g/dL — ABNORMAL LOW (ref 12.0–15.0)
Immature Granulocytes: 1 %
Lymphocytes Relative: 25 %
Lymphs Abs: 1.2 K/uL (ref 0.7–4.0)
MCH: 28.7 pg (ref 26.0–34.0)
MCHC: 32.6 g/dL (ref 30.0–36.0)
MCV: 88.1 fL (ref 80.0–100.0)
Monocytes Absolute: 0.8 K/uL (ref 0.1–1.0)
Monocytes Relative: 17 %
Neutro Abs: 2.4 K/uL (ref 1.7–7.7)
Neutrophils Relative %: 51 %
Platelet Count: 206 K/uL (ref 150–400)
RBC: 4.11 MIL/uL (ref 3.87–5.11)
RDW: 14.6 % (ref 11.5–15.5)
WBC Count: 4.6 K/uL (ref 4.0–10.5)
nRBC: 0 % (ref 0.0–0.2)

## 2024-03-17 LAB — CMP (CANCER CENTER ONLY)
ALT: 17 U/L (ref 0–44)
AST: 16 U/L (ref 15–41)
Albumin: 4.6 g/dL (ref 3.5–5.0)
Alkaline Phosphatase: 76 U/L (ref 38–126)
Anion gap: 11 (ref 5–15)
BUN: 10 mg/dL (ref 6–20)
CO2: 25 mmol/L (ref 22–32)
Calcium: 9.4 mg/dL (ref 8.9–10.3)
Chloride: 107 mmol/L (ref 98–111)
Creatinine: 0.9 mg/dL (ref 0.44–1.00)
GFR, Estimated: >60 mL/min
Glucose, Bld: 92 mg/dL (ref 70–99)
Potassium: 3.7 mmol/L (ref 3.5–5.1)
Sodium: 142 mmol/L (ref 135–145)
Total Bilirubin: 0.5 mg/dL (ref 0.0–1.2)
Total Protein: 7.9 g/dL (ref 6.5–8.1)

## 2024-03-17 LAB — LACTATE DEHYDROGENASE: LDH: 171 U/L (ref 105–235)

## 2024-03-17 MED ORDER — LENALIDOMIDE 10 MG PO CAPS: 10.0000 mg | ORAL_CAPSULE | Freq: Every day | ORAL | 0 refills | Status: DC

## 2024-03-20 LAB — KAPPA/LAMBDA LIGHT CHAINS
Kappa free light chain: 23.3 mg/L — ABNORMAL HIGH (ref 3.3–19.4)
Kappa, lambda light chain ratio: 1.36 (ref 0.26–1.65)
Lambda free light chains: 17.1 mg/L (ref 5.7–26.3)

## 2024-03-21 ENCOUNTER — Telehealth: Payer: Self-pay

## 2024-03-21 LAB — MULTIPLE MYELOMA PANEL, SERUM
Albumin SerPl Elph-Mcnc: 3.7 g/dL (ref 2.9–4.4)
Albumin/Glob SerPl: 1 (ref 0.7–1.7)
Alpha 1: 0.3 g/dL (ref 0.0–0.4)
Alpha2 Glob SerPl Elph-Mcnc: 0.8 g/dL (ref 0.4–1.0)
B-Globulin SerPl Elph-Mcnc: 1.3 g/dL (ref 0.7–1.3)
Gamma Glob SerPl Elph-Mcnc: 1.5 g/dL (ref 0.4–1.8)
Globulin, Total: 3.9 g/dL (ref 2.2–3.9)
IgA: 266 mg/dL (ref 87–352)
IgG (Immunoglobin G), Serum: 1465 mg/dL (ref 586–1602)
IgM (Immunoglobulin M), Srm: 36 mg/dL (ref 26–217)
Total Protein ELP: 7.6 g/dL (ref 6.0–8.5)

## 2024-03-21 NOTE — Telephone Encounter (Signed)
 Returned TC to Pharmacy as they left a message reporting  they have been trying to enroll pt in a grant to cover her Revlimid  expense.  Presently her copay is $2100 for 2026 and she will not receive medication until this is paid.  Pt assistant representatives have been unable to reach pt or have her to return their call. Spoke with Oncology Pharmacist, Kaitlyn Schomburg, regarding possible grant coverage with this center. TC to patient and left a message requesting to please return a call to this office as we need to assist her with grant coverage in order to have her medication provided.

## 2024-03-22 ENCOUNTER — Telehealth: Payer: Self-pay

## 2024-03-22 NOTE — Telephone Encounter (Signed)
 Oral Oncology Patient Advocate Encounter  Was successful in securing patient a $8000 grant from [employer] [facility] to provide copayment coverage for Revlimid .  This will keep the out of pocket expense at $0.     [employer] ID: [id number]   The billing information is as follows and has been shared with [employer].    RxBin: [id number] PCN: [id number] Member ID: [id number] Group ID: [id number] Dates of Eligibility: 02/21/24 through 02/19/25  Fund:  MM  Lucie Lamer, CPhT [place]  [facility] Oncology Pharmacy Patient Advocate Specialist [facility] [place] Phone: [phone]  Fax: [phone] [email or web address]

## 2024-03-23 ENCOUNTER — Encounter: Payer: Self-pay | Admitting: Cardiology

## 2024-04-04 ENCOUNTER — Telehealth: Payer: Self-pay

## 2024-04-04 NOTE — Telephone Encounter (Signed)
 Returned call to pt who reported she has lots of small red circles under her skin on her (R) leg. She verbalized they do not itch; are not hot to touch and have been there since December 2025 yet seem to be getting larger. She denies fever and states she thinks she needs to take a fluid pill.  Pt has not seen her PCP about this. She  reports she is happy to wait and see Dr Federico at her next appt 1/29 unless he feels she should come in sooner. Advised pt that I would make Dr Federico aware of her issue.  Advised to seek care sooner if condition becomes worse.  Pt voiced agreement.

## 2024-04-06 ENCOUNTER — Encounter: Payer: Self-pay | Admitting: Internal Medicine

## 2024-04-13 ENCOUNTER — Inpatient Hospital Stay: Admitting: Hematology and Oncology

## 2024-04-13 ENCOUNTER — Inpatient Hospital Stay

## 2024-04-13 VITALS — BP 139/62 | HR 69 | Temp 98.0°F | Resp 18 | Ht 61.0 in | Wt 164.9 lb

## 2024-04-13 DIAGNOSIS — C9000 Multiple myeloma not having achieved remission: Secondary | ICD-10-CM

## 2024-04-13 DIAGNOSIS — R450 Nervousness: Secondary | ICD-10-CM

## 2024-04-13 LAB — CMP (CANCER CENTER ONLY)
ALT: 18 U/L (ref 0–44)
AST: 16 U/L (ref 15–41)
Albumin: 4.6 g/dL (ref 3.5–5.0)
Alkaline Phosphatase: 81 U/L (ref 38–126)
Anion gap: 12 (ref 5–15)
BUN: 8 mg/dL (ref 6–20)
CO2: 25 mmol/L (ref 22–32)
Calcium: 9.5 mg/dL (ref 8.9–10.3)
Chloride: 106 mmol/L (ref 98–111)
Creatinine: 0.7 mg/dL (ref 0.44–1.00)
GFR, Estimated: >60 mL/min
Glucose, Bld: 110 mg/dL — ABNORMAL HIGH (ref 70–99)
Potassium: 3.6 mmol/L (ref 3.5–5.1)
Sodium: 142 mmol/L (ref 135–145)
Total Bilirubin: 0.4 mg/dL (ref 0.0–1.2)
Total Protein: 8.1 g/dL (ref 6.5–8.1)

## 2024-04-13 LAB — CBC WITH DIFFERENTIAL (CANCER CENTER ONLY)
Abs Immature Granulocytes: 0.01 10*3/uL (ref 0.00–0.07)
Basophils Absolute: 0.1 10*3/uL (ref 0.0–0.1)
Basophils Relative: 2 %
Eosinophils Absolute: 0.2 10*3/uL (ref 0.0–0.5)
Eosinophils Relative: 3 %
HCT: 37.2 % (ref 36.0–46.0)
Hemoglobin: 12.4 g/dL (ref 12.0–15.0)
Immature Granulocytes: 0 %
Lymphocytes Relative: 29 %
Lymphs Abs: 1.3 10*3/uL (ref 0.7–4.0)
MCH: 29.1 pg (ref 26.0–34.0)
MCHC: 33.3 g/dL (ref 30.0–36.0)
MCV: 87.3 fL (ref 80.0–100.0)
Monocytes Absolute: 0.7 10*3/uL (ref 0.1–1.0)
Monocytes Relative: 15 %
Neutro Abs: 2.4 10*3/uL (ref 1.7–7.7)
Neutrophils Relative %: 51 %
Platelet Count: 180 10*3/uL (ref 150–400)
RBC: 4.26 MIL/uL (ref 3.87–5.11)
RDW: 14.8 % (ref 11.5–15.5)
WBC Count: 4.7 10*3/uL (ref 4.0–10.5)
nRBC: 0 % (ref 0.0–0.2)

## 2024-04-13 LAB — LACTATE DEHYDROGENASE: LDH: 184 U/L (ref 105–235)

## 2024-04-14 ENCOUNTER — Inpatient Hospital Stay: Admitting: Hematology and Oncology

## 2024-04-14 ENCOUNTER — Other Ambulatory Visit: Payer: Self-pay | Admitting: *Deleted

## 2024-04-14 ENCOUNTER — Inpatient Hospital Stay

## 2024-04-14 DIAGNOSIS — C9000 Multiple myeloma not having achieved remission: Secondary | ICD-10-CM

## 2024-04-14 LAB — KAPPA/LAMBDA LIGHT CHAINS
Kappa free light chain: 23 mg/L — ABNORMAL HIGH (ref 3.3–19.4)
Kappa, lambda light chain ratio: 1.37 (ref 0.26–1.65)
Lambda free light chains: 16.8 mg/L (ref 5.7–26.3)

## 2024-04-14 MED ORDER — LENALIDOMIDE 10 MG PO CAPS: 10.0000 mg | ORAL_CAPSULE | Freq: Every day | ORAL | 0 refills | Status: AC

## 2024-04-16 ENCOUNTER — Encounter: Payer: Self-pay | Admitting: Hematology and Oncology

## 2024-04-18 ENCOUNTER — Telehealth: Payer: Self-pay | Admitting: Hematology and Oncology

## 2024-04-18 NOTE — Telephone Encounter (Signed)
 Called pt and left message about me scheduling future appts

## 2024-04-19 LAB — MULTIPLE MYELOMA PANEL, SERUM
Albumin SerPl Elph-Mcnc: 4 g/dL (ref 2.9–4.4)
Albumin/Glob SerPl: 1.2 (ref 0.7–1.7)
Alpha 1: 0.2 g/dL (ref 0.0–0.4)
Alpha2 Glob SerPl Elph-Mcnc: 0.7 g/dL (ref 0.4–1.0)
B-Globulin SerPl Elph-Mcnc: 1.2 g/dL (ref 0.7–1.3)
Gamma Glob SerPl Elph-Mcnc: 1.3 g/dL (ref 0.4–1.8)
Globulin, Total: 3.4 g/dL (ref 2.2–3.9)
IgA: 267 mg/dL (ref 87–352)
IgG (Immunoglobin G), Serum: 1334 mg/dL (ref 586–1602)
IgM (Immunoglobulin M), Srm: 27 mg/dL (ref 26–217)
Total Protein ELP: 7.4 g/dL (ref 6.0–8.5)

## 2024-05-11 ENCOUNTER — Inpatient Hospital Stay: Attending: Physician Assistant

## 2024-06-08 ENCOUNTER — Inpatient Hospital Stay: Attending: Physician Assistant

## 2024-07-06 ENCOUNTER — Inpatient Hospital Stay: Attending: Physician Assistant | Admitting: Hematology and Oncology

## 2024-07-06 ENCOUNTER — Inpatient Hospital Stay

## 2024-08-03 ENCOUNTER — Inpatient Hospital Stay: Attending: Physician Assistant

## 2024-09-07 ENCOUNTER — Inpatient Hospital Stay: Attending: Physician Assistant

## 2024-10-05 ENCOUNTER — Inpatient Hospital Stay: Attending: Physician Assistant | Admitting: Hematology and Oncology

## 2024-10-05 ENCOUNTER — Inpatient Hospital Stay

## 2024-11-02 ENCOUNTER — Inpatient Hospital Stay: Attending: Physician Assistant
